# Patient Record
Sex: Female | Born: 1935 | Race: White | Hispanic: No | Smoking: Never smoker
Health system: Southern US, Community
[De-identification: ages and names within clinical notes are randomized; demographics above are authoritative.]

## PROBLEM LIST (undated history)

## (undated) DIAGNOSIS — C801 Malignant (primary) neoplasm, unspecified: Secondary | ICD-10-CM

## (undated) DIAGNOSIS — M519 Unspecified thoracic, thoracolumbar and lumbosacral intervertebral disc disorder: Secondary | ICD-10-CM

## (undated) DIAGNOSIS — D649 Anemia, unspecified: Secondary | ICD-10-CM

## (undated) DIAGNOSIS — S72009A Fracture of unspecified part of neck of unspecified femur, initial encounter for closed fracture: Secondary | ICD-10-CM

## (undated) DIAGNOSIS — I10 Essential (primary) hypertension: Secondary | ICD-10-CM

## (undated) DIAGNOSIS — I779 Disorder of arteries and arterioles, unspecified: Secondary | ICD-10-CM

## (undated) DIAGNOSIS — J189 Pneumonia, unspecified organism: Secondary | ICD-10-CM

## (undated) DIAGNOSIS — I119 Hypertensive heart disease without heart failure: Secondary | ICD-10-CM

## (undated) DIAGNOSIS — I509 Heart failure, unspecified: Secondary | ICD-10-CM

## (undated) DIAGNOSIS — N390 Urinary tract infection, site not specified: Secondary | ICD-10-CM

## (undated) DIAGNOSIS — I739 Peripheral vascular disease, unspecified: Secondary | ICD-10-CM

## (undated) DIAGNOSIS — E039 Hypothyroidism, unspecified: Secondary | ICD-10-CM

## (undated) DIAGNOSIS — E7800 Pure hypercholesterolemia, unspecified: Secondary | ICD-10-CM

## (undated) DIAGNOSIS — I480 Paroxysmal atrial fibrillation: Secondary | ICD-10-CM

## (undated) DIAGNOSIS — M1990 Unspecified osteoarthritis, unspecified site: Secondary | ICD-10-CM

## (undated) DIAGNOSIS — K5790 Diverticulosis of intestine, part unspecified, without perforation or abscess without bleeding: Secondary | ICD-10-CM

## (undated) DIAGNOSIS — I499 Cardiac arrhythmia, unspecified: Secondary | ICD-10-CM

## (undated) DIAGNOSIS — I340 Nonrheumatic mitral (valve) insufficiency: Secondary | ICD-10-CM

## (undated) DIAGNOSIS — R011 Cardiac murmur, unspecified: Secondary | ICD-10-CM

## (undated) HISTORY — DX: Cardiac arrhythmia, unspecified: I49.9

## (undated) HISTORY — PX: ABDOMINAL HYSTERECTOMY: SHX81

## (undated) HISTORY — PX: KNEE SURGERY: SHX244

## (undated) HISTORY — DX: Fracture of unspecified part of neck of unspecified femur, initial encounter for closed fracture: S72.009A

## (undated) HISTORY — PX: CATARACT EXTRACTION: SUR2

## (undated) HISTORY — PX: CARPAL TUNNEL RELEASE: SHX101

## (undated) HISTORY — DX: Nonrheumatic mitral (valve) insufficiency: I34.0

## (undated) HISTORY — DX: Urinary tract infection, site not specified: N39.0

## (undated) HISTORY — PX: EYE SURGERY: SHX253

## (undated) HISTORY — PX: ROTATOR CUFF REPAIR: SHX139

---

## 1998-09-10 ENCOUNTER — Other Ambulatory Visit: Admission: RE | Admit: 1998-09-10 | Discharge: 1998-09-10 | Payer: Self-pay | Admitting: Obstetrics and Gynecology

## 1999-02-14 ENCOUNTER — Emergency Department (HOSPITAL_COMMUNITY): Admission: EM | Admit: 1999-02-14 | Discharge: 1999-02-14 | Payer: Self-pay | Admitting: *Deleted

## 1999-03-30 ENCOUNTER — Encounter: Payer: Self-pay | Admitting: Emergency Medicine

## 1999-03-31 ENCOUNTER — Inpatient Hospital Stay (HOSPITAL_COMMUNITY): Admission: EM | Admit: 1999-03-31 | Discharge: 1999-04-01 | Payer: Self-pay | Admitting: Emergency Medicine

## 2001-03-08 ENCOUNTER — Other Ambulatory Visit: Admission: RE | Admit: 2001-03-08 | Discharge: 2001-03-08 | Payer: Self-pay | Admitting: Obstetrics and Gynecology

## 2001-08-25 ENCOUNTER — Encounter: Admission: RE | Admit: 2001-08-25 | Discharge: 2001-11-23 | Payer: Self-pay | Admitting: Emergency Medicine

## 2002-03-02 ENCOUNTER — Ambulatory Visit (HOSPITAL_COMMUNITY): Admission: RE | Admit: 2002-03-02 | Discharge: 2002-03-02 | Payer: Self-pay | Admitting: Cardiology

## 2002-03-02 ENCOUNTER — Encounter: Payer: Self-pay | Admitting: Cardiology

## 2002-07-14 ENCOUNTER — Ambulatory Visit (HOSPITAL_COMMUNITY): Admission: RE | Admit: 2002-07-14 | Discharge: 2002-07-14 | Payer: Self-pay | Admitting: Gastroenterology

## 2002-07-14 ENCOUNTER — Encounter (INDEPENDENT_AMBULATORY_CARE_PROVIDER_SITE_OTHER): Payer: Self-pay | Admitting: Specialist

## 2003-03-02 ENCOUNTER — Other Ambulatory Visit: Admission: RE | Admit: 2003-03-02 | Discharge: 2003-03-02 | Payer: Self-pay | Admitting: Obstetrics and Gynecology

## 2003-10-09 ENCOUNTER — Inpatient Hospital Stay (HOSPITAL_COMMUNITY): Admission: RE | Admit: 2003-10-09 | Discharge: 2003-10-10 | Payer: Self-pay | Admitting: Vascular Surgery

## 2003-10-09 ENCOUNTER — Encounter (INDEPENDENT_AMBULATORY_CARE_PROVIDER_SITE_OTHER): Payer: Self-pay | Admitting: *Deleted

## 2003-10-09 HISTORY — PX: CAROTID ENDARTERECTOMY: SUR193

## 2003-11-30 ENCOUNTER — Emergency Department (HOSPITAL_COMMUNITY): Admission: EM | Admit: 2003-11-30 | Discharge: 2003-11-30 | Payer: Self-pay | Admitting: Emergency Medicine

## 2003-12-01 ENCOUNTER — Emergency Department (HOSPITAL_COMMUNITY): Admission: EM | Admit: 2003-12-01 | Discharge: 2003-12-01 | Payer: Self-pay | Admitting: Emergency Medicine

## 2003-12-03 ENCOUNTER — Other Ambulatory Visit: Admission: RE | Admit: 2003-12-03 | Discharge: 2003-12-03 | Payer: Self-pay | Admitting: Oncology

## 2003-12-03 ENCOUNTER — Encounter (INDEPENDENT_AMBULATORY_CARE_PROVIDER_SITE_OTHER): Payer: Self-pay | Admitting: Specialist

## 2003-12-31 ENCOUNTER — Ambulatory Visit: Payer: Self-pay | Admitting: Oncology

## 2004-02-26 ENCOUNTER — Ambulatory Visit: Payer: Self-pay | Admitting: Oncology

## 2004-06-02 ENCOUNTER — Ambulatory Visit: Payer: Self-pay | Admitting: Oncology

## 2004-08-04 ENCOUNTER — Ambulatory Visit: Payer: Self-pay | Admitting: Internal Medicine

## 2004-10-24 ENCOUNTER — Ambulatory Visit: Payer: Self-pay | Admitting: Internal Medicine

## 2004-12-15 ENCOUNTER — Ambulatory Visit: Payer: Self-pay | Admitting: Internal Medicine

## 2005-02-02 ENCOUNTER — Ambulatory Visit: Payer: Self-pay | Admitting: Internal Medicine

## 2005-05-11 ENCOUNTER — Ambulatory Visit: Payer: Self-pay | Admitting: Internal Medicine

## 2005-08-13 ENCOUNTER — Ambulatory Visit: Payer: Self-pay | Admitting: Internal Medicine

## 2005-08-17 LAB — CBC WITH DIFFERENTIAL/PLATELET
BASO%: 0.6 % (ref 0.0–2.0)
Basophils Absolute: 0 10*3/uL (ref 0.0–0.1)
EOS%: 1.7 % (ref 0.0–7.0)
Eosinophils Absolute: 0.1 10*3/uL (ref 0.0–0.5)
HCT: 35.6 % (ref 34.8–46.6)
HGB: 12.2 g/dL (ref 11.6–15.9)
LYMPH%: 29.1 % (ref 14.0–48.0)
MCH: 31.7 pg (ref 26.0–34.0)
MCHC: 34.4 g/dL (ref 32.0–36.0)
MCV: 92.1 fL (ref 81.0–101.0)
MONO#: 0.3 10*3/uL (ref 0.1–0.9)
MONO%: 7.4 % (ref 0.0–13.0)
NEUT#: 2.4 10*3/uL (ref 1.5–6.5)
NEUT%: 61.2 % (ref 39.6–76.8)
Platelets: 183 10*3/uL (ref 145–400)
RBC: 3.86 10*6/uL (ref 3.70–5.32)
RDW: 12.7 % (ref 11.3–14.5)
WBC: 3.9 10*3/uL (ref 3.9–10.0)
lymph#: 1.1 10*3/uL (ref 0.9–3.3)

## 2005-08-17 LAB — LACTATE DEHYDROGENASE: LDH: 184 U/L (ref 94–250)

## 2005-11-02 ENCOUNTER — Encounter: Admission: RE | Admit: 2005-11-02 | Discharge: 2005-11-02 | Payer: Self-pay | Admitting: Emergency Medicine

## 2005-12-07 ENCOUNTER — Ambulatory Visit (HOSPITAL_COMMUNITY): Admission: RE | Admit: 2005-12-07 | Discharge: 2005-12-07 | Payer: Self-pay | Admitting: General Surgery

## 2005-12-07 ENCOUNTER — Encounter (INDEPENDENT_AMBULATORY_CARE_PROVIDER_SITE_OTHER): Payer: Self-pay | Admitting: Specialist

## 2005-12-10 ENCOUNTER — Ambulatory Visit: Payer: Self-pay | Admitting: Internal Medicine

## 2006-11-16 ENCOUNTER — Other Ambulatory Visit: Admission: RE | Admit: 2006-11-16 | Discharge: 2006-11-16 | Payer: Self-pay | Admitting: Obstetrics and Gynecology

## 2007-03-04 ENCOUNTER — Ambulatory Visit: Payer: Self-pay | Admitting: Vascular Surgery

## 2007-09-30 ENCOUNTER — Ambulatory Visit (HOSPITAL_COMMUNITY): Admission: RE | Admit: 2007-09-30 | Discharge: 2007-10-01 | Payer: Self-pay | Admitting: Orthopedic Surgery

## 2008-04-05 ENCOUNTER — Ambulatory Visit (HOSPITAL_BASED_OUTPATIENT_CLINIC_OR_DEPARTMENT_OTHER): Admission: RE | Admit: 2008-04-05 | Discharge: 2008-04-05 | Payer: Self-pay | Admitting: Orthopedic Surgery

## 2008-04-26 ENCOUNTER — Encounter: Admission: RE | Admit: 2008-04-26 | Discharge: 2008-04-26 | Payer: Self-pay | Admitting: Orthopedic Surgery

## 2008-09-26 ENCOUNTER — Ambulatory Visit: Payer: Self-pay | Admitting: Vascular Surgery

## 2009-10-15 ENCOUNTER — Ambulatory Visit: Payer: Self-pay | Admitting: Obstetrics and Gynecology

## 2009-10-15 ENCOUNTER — Other Ambulatory Visit: Admission: RE | Admit: 2009-10-15 | Discharge: 2009-10-15 | Payer: Self-pay | Admitting: Obstetrics and Gynecology

## 2009-12-12 ENCOUNTER — Ambulatory Visit: Payer: Self-pay | Admitting: Vascular Surgery

## 2009-12-18 ENCOUNTER — Encounter: Admission: RE | Admit: 2009-12-18 | Discharge: 2009-12-18 | Payer: Self-pay | Admitting: Neurosurgery

## 2010-03-16 ENCOUNTER — Encounter: Payer: Self-pay | Admitting: General Surgery

## 2010-06-10 LAB — POCT I-STAT 4, (NA,K, GLUC, HGB,HCT)
Glucose, Bld: 94 mg/dL (ref 70–99)
HCT: 38 % (ref 36.0–46.0)
Hemoglobin: 12.9 g/dL (ref 12.0–15.0)
Potassium: 3.5 mEq/L (ref 3.5–5.1)
Sodium: 141 mEq/L (ref 135–145)

## 2010-07-08 NOTE — Op Note (Signed)
NAMECAMIRA, GEIDEL                  ACCOUNT NO.:  [id number]   MEDICAL RECORD NO.:  [id number]          PATIENT TYPE:  AMB   LOCATION:  [facility]                         FACILITY:  [facility]   PHYSICIAN:  Marlowe Kays, M.D.  DATE OF BIRTH:  08/13/1924   DATE OF PROCEDURE:  04/05/2008  DATE OF DISCHARGE:                               OPERATIVE REPORT   PREOPERATIVE DIAGNOSIS:  Carpal tunnel syndrome, right.   POSTOPERATIVE DIAGNOSIS:  Carpal tunnel syndrome, right.   OPERATION:  Decompression median nerve right wrist and hand.   SURGEON:  Marlowe Kays, M.D.   ASSISTANT:  Nurse.   ANESTHESIA:  IV regional.   PATHOLOGY AND JUSTIFICATION OF PROCEDURE:  Signs and symptoms of carpal  tunnel syndrome with markedly delayed median nerve conduction study on  testing.  Ulnar nerve had minimal delay at the elbow, was basically  normal.   PROCEDURE:  Satisfactory IV regional anesthesia, DuraPrep from  midforearm to fingertips, draped in sterile field, I marked out surgical  incision along the base of the thenar eminence crossing obliquely over  the flexor crease of the wrist and the distal forearm.  The palmaris  longus tendon was identified and retracted to the radial side.  Beneath  it, the median nerve was exposed and I immediately noted that it was  quite swollen and discolored with a bulbous appearance proximal to the  flexor wrist.  I progressively released skin, subcutaneous tissue and  fascia in the distal palm decompressing the nerve.  It was badly  contused and thinned out in the mid palm.  Bleeders were coagulated with  bipolar cautery.  At the conclusion of the decompression.  I then  irrigated the wound well with sterile saline, closed the skin and  subcutaneous tissue only with interrupted 4-0 __________ sutures.  Betadine, Adaptic, dry sterile dressing and volar plaster splint were  applied.  Tourniquet was released.  She tolerated the procedure well and  was taken to the  recovery room in satisfactory condition with no known  complications.           ______________________________  Marlowe Kays, M.D.     JA/MEDQ  D:  04/05/2008  T:  04/05/2008  Job:  [id number]

## 2010-07-08 NOTE — Procedures (Signed)
CAROTID DUPLEX EXAM   INDICATION:  Followup carotid artery disease, syncope.   HISTORY:  Diabetes:  No.  Cardiac:  No.  Hypertension:  Yes.  Smoking:  No.  Previous Surgery:  Right CEA with DPA and resection of redundant CCA on  10/09/2003 by Dr. Arbie Cookey.  CV History:  Syncope infrequently.  Amaurosis Fugax No, Paresthesias No, Hemiparesis No                                       RIGHT             LEFT  Brachial systolic pressure:         140               136  Brachial Doppler waveforms:         WNL               WNL  Vertebral direction of flow:        Antegrade         Antegrade  DUPLEX VELOCITIES (cm/sec)  CCA peak systolic                   97                81  ECA peak systolic                   202               323  ICA peak systolic                   131               126  ICA end diastolic                   26                28  PLAQUE MORPHOLOGY:                  Homogeneous       Calcific  PLAQUE AMOUNT:                      Mild              Mild/moderate  PLAQUE LOCATION:                    ICA/ECA/CCA       ICA/ECA/CCA   IMPRESSION:  1. Right ICA velocities are suggestive of 40-59% stenosis (low end of      range), showing no significant change in velocities from previous      study.  2. Left ICA shows evidence of 40-59% stenosis, an increase from      previous study.  3. Bilateral ECA stenosis.   ___________________________________________  Larina Earthly, M.D.   AS/MEDQ  D:  09/26/2008  T:  09/26/2008  Job:  [id number]

## 2010-07-08 NOTE — Procedures (Signed)
CAROTID DUPLEX EXAM   INDICATION:  Syncope.   HISTORY:  Diabetes:  No.  Cardiac:  No.  Hypertension:  Yes, on medication.  Smoking:  No.  Previous Surgery:  Right carotid endarterectomy with DPA and resection  of redundant common carotid artery on 10/09/2003 by Dr. Arbie Cookey.  CV History:  Amaurosis Fugax No, Paresthesias No, Hemiparesis No                                       RIGHT             LEFT  Brachial systolic pressure:         150               150  Brachial Doppler waveforms:         Biphasic          Biphasic  Vertebral direction of flow:        Antegrade         Antegrade  DUPLEX VELOCITIES (cm/sec)  CCA peak systolic                   74                80  ECA peak systolic                   173               318  ICA peak systolic                   115               84  ICA end diastolic                   24                22  PLAQUE MORPHOLOGY:                  Soft              Calcified  PLAQUE AMOUNT:                      Moderate          Large  PLAQUE LOCATION:                    Distal CCA, ECA, ICA                ICA, ECA    Velocities in the distal right common carotid artery are 124 cm/s  IMPRESSION:  1. Bilateral external carotid artery stenoses.  2. 1-39% bilateral internal carotid artery stenoses.  3. Mildly elevated velocities in the distal right common carotid      artery are stable from previous examination.  4. Study essentially unchanged from 12/11/2004.   ___________________________________________  Larina Earthly, M.D.   DP/MEDQ  D:  03/04/2007  T:  03/04/2007  Job:  [id number]

## 2010-07-08 NOTE — Procedures (Signed)
CAROTID DUPLEX EXAM   INDICATION:  Follow up carotid artery disease, syncope.   HISTORY:  Diabetes:  No.  Cardiac:  No.  Hypertension:  Yes.  Smoking:  No.  Previous Surgery:  Right CEA with a DPA and resection of redundant CCA  on 10/09/2003 by Dr. Arbie Cookey.  CV History:  Syncope infrequently.  Amaurosis Fugax , Paresthesias , Hemiparesis                                       RIGHT             LEFT  Brachial systolic pressure:         140               136  Brachial Doppler waveforms:         WNL               WNL  Vertebral direction of flow:        Antegrade         Antegrade  DUPLEX VELOCITIES (cm/sec)  CCA peak systolic                   65                68  ECA peak systolic                   129               292  ICA peak systolic                   114               85  ICA end diastolic                   25                19  PLAQUE MORPHOLOGY:                  Heterogenous      Heterogenous  PLAQUE AMOUNT:                      Mild              Mild-to-moderate  PLAQUE LOCATION:                    ICA, CCA, ECA     ICA, CCA, ECA   IMPRESSION:  1. Right internal carotid artery velocities are suggestive of 40% to      59% stenosis, the low end range.  2. Left internal carotid artery is 1% to 39% stenosis.  3. Stable study compared to previous study of 10/06/2008.   ___________________________________________  Larina Earthly, M.D.   OD/MEDQ  D:  12/12/2009  T:  12/12/2009  Job:  [id number]

## 2010-07-08 NOTE — Op Note (Signed)
Desiree [name], Desiree [name]                  ACCOUNT NO.:  [id number]   MEDICAL RECORD NO.:  [id number]          PATIENT TYPE:  AMB   LOCATION:  DAY                          FACILITY:  [facility]   PHYSICIAN:  Marlowe Kays, M.D.  DATE OF BIRTH:  1924-08-26   DATE OF PROCEDURE:  09/30/2007  DATE OF DISCHARGE:                               OPERATIVE REPORT   PREOPERATIVE DIAGNOSIS:  Recurrent rotator cuff tear, left shoulder.   POSTOPERATIVE DIAGNOSIS:  Recurrent rotator cuff tear, left shoulder.   OPERATION:  Anterior acromionectomy, repair of recurrent rotator cuff  tear, left shoulder.   SURGEON:  Marlowe Kays, M.D.   ASSISTANTDruscilla Brownie. Idolina Primer, P.A.-C.   ANESTHESIA:  General.   PATHOLOGY/JUSTIFICATION FOR PROCEDURE:  She had had a rotator cuff tear  as well as distal clavicle excision by one of my now retired partners,  Dr. Ronnell Guadalajara, a number of years ago.  Because of recurrent pain in  the shoulder,  I obtained an MRI demonstrating articular surface tear of  the distal rotator cuff with about 3-5 cm of retraction and medial  subluxation of the biceps tendon.  Preoperative plan was to detach the  thin remaining bursal surface fibers of the rotator cuff and then  reattach the entire rotator cuff.   PROCEDURE:  Prophylactic antibiotics, satisfactory anesthesia, beach-  chair position on the [place] frame.  Left shoulder girdle was prepped  with DuraPrep, draped in sterile field.  Old surgical incision was  marked out, [place] employed.  Time out performed.  I made incision  through the old scar down to the acromion and with cutting cautery split  the fascia over the anterior acromion, and the deltoid 4 cm lateral ward  to expose the anterior acromion which had reformed with some bone in BAK  fashion.  I was able to undermine this area with small Cobb elevator  followed by a larger Cobb elevator and then performed a generous  anterior acromionectomy and decompression on the  underneath surface  until there was wide clearance for the rotator cuff.  There was bursal  tissue adherent to the rotator cuff which I freed up with sharp  dissection to identify the rotator cuff pathology.  The obvious external  pathology was about a centimeter tear just posterior to the greater  tuberosity.  I opened this up posteriorly and anteriorly, and this  allowed me to look way up underneath the articular surface of the  rotator cuff.  Biceps tendon was identified, and the rotator cuff was  actually reasonably healthy except for this lateral portion with partial  detachment.  After freeing up the rotator cuff, I roughened up the  greater tuberosity area and used a single Stryker 4 stranded rotator  cuff anchor in the lateral humerus, checking to be sure that it was well  stabilized in the bone.  I then interspersed the 4 strands through the  length of the rotator cuff anteriorly and posteriorly and, with the arm  slightly abducted, was able to bring the rotator cuff over laterally.  After tying these sutures, I  then supplemented the end of the rotator  cuff with multiple additional sutures from these 4 strands, giving a  nice supplemented smooth terminal portion to the rotator cuff.  I then  checked, and there did not appear to be any other tears anteriorly or  posteriorly, and there was no residual impingement.  The wound was  irrigated well with sterile saline.  She had a preoperative interscalene  block but I supplemented her anyway with some half percent Marcaine with  adrenaline.  I then closed  the wound with interrupted #1 Vicryl in the deltoid muscle and fascia  over the residual anterior acromion, 2-0 Vicryl subcutaneous tissue,  Steri-Strips on the skin.  Dry sterile dressing and shoulder immobilizer  applied.  She tolerated the procedure well and was taken to recovery in  satisfactory condition with no known complications.           ______________________________   Marlowe Kays, M.D.     JA/MEDQ  D:  09/30/2007  T:  09/30/2007  Job:  [id number]

## 2010-07-11 NOTE — Op Note (Signed)
NAMECHARL, Desiree [name]                  ACCOUNT NO.:  [id number]   MEDICAL RECORD NO.:  [id number]          PATIENT TYPE:  AMB   LOCATION:  DAY                          FACILITY:  [facility]   PHYSICIAN:  Timothy E. Earlene Plater, M.D. DATE OF BIRTH:  September 04, 1924   DATE OF PROCEDURE:  12/07/2005  DATE OF DISCHARGE:                                 OPERATIVE REPORT   PREOPERATIVE DIAGNOSIS:  Prolapsing hemorrhoids.   POSTOPERATIVE DIAGNOSIS:  Prolapsing hemorrhoids.   PROCEDURE:  PPH.   SURGEON:  Timothy E. Earlene Plater, M.D.   ANESTHESIA:  General.   Desiree [name] is 78, active, full time employed, and has prolapsing hemorrhoids  with mucous soilage, inability to reduce.  She desires surgery.  Unfortunately she has a considerable irritable bowel syndrome with mucous  and alternating constipation and diarrhea.  She has been carefully explained  the procedure, the expectations, potential complications, and the interplay  of the irritable bowel syndrome.   She is seen, identified, and the permit signed today.   DESCRIPTION OF PROCEDURE:  She is taken to the operating room, placed  supine.  LMA anesthesia provided.  She was placed in lithotomy position.  The perianal area was inspected, prepped and draped in the usual fashion.  There was a small external tag right anterior position.  The sphincter was  present reactive but relaxed.  And there was easy prolapse of the distal  mucosa.   The operating anoscope placed, redundant mucosa and easy prolapse noted.  The anus was injected round and about with Marcaine, epinephrine, [place] for  prolonged anesthesia.  With the operating scope in place, a circumferential  pursestring suture of 2-0 Prolene was placed beginning anteriorly,  encircling the rectal mucosa only.  This was done at 5 cm proximal to the  dentate line.  The operating scope removed. The plastic Ethicon operating  anoscope inserted.  The sutures brought through the center of the scope.  The suture  line in its circumferential nature was checked with the examining  finger and then the opened distal end of the PPH stapling device was placed  gently through the circumferential suture.  The handle down position.  The  suture was then tied about the shaft of the stapling device and then with it  properly positioned, it was gently advanced and closed.  It was held there  for 40 seconds and then fired and held for 45 seconds and then removed.  The  specimen was good, intact about the shaft of the stapling device with suture  intact.  It was cut and sent to pathology.  Meanwhile, the attention to the  suture line showed one area of bleeding in the left anterior position.  A  suture ligature of 4-0 suture was placed and that was controlled.  Careful  observation and gentle exam over the next 5 minutes revealed no other areas  of bleeding.  This procedure was complete. Gelfoam wrapped in Surgicel was  placed in rectal canal and dry sterile external dressing.  She tolerated it  well and was removed recovery room in good condition.  Written and verbal instructions were given to her and her daughter with the  warning signs.  She has Vicodin at home that she uses on regular basis that  will be supplemented and she will be followed as an outpatient.      Timothy E. Earlene Plater, M.D.  Electronically Signed     TED/MEDQ  D:  12/07/2005  T:  12/07/2005  Job:  [id number]   cc:   Georga Hacking, M.D.  Fax: [phone]  Email: [email or web address]   Bernette Redbird, M.D.  Fax: [phone]

## 2010-07-11 NOTE — H&P (Signed)
[place]  Patient:    Desiree [name], Desiree [name]                         MRN: [id number] Adm. Date:  03/31/99 Dictator:   Doreatha Lew, M.D.                         History and Physical  HISTORY OF PRESENT ILLNESS:  The patient is a 77 year old active female, who acutely developed diarrhea today around noon while eating lunch.  Two to three hours later she began to get nauseated with persistent vomiting that lasted three to four hours.  She became weak and a friend of hers called EMS and brought her to [facility] for evaluation.  Once in [place], she has received over a liter of fluid with little relief.  She currently is complaining of some diffuse abdominal pain and nausea.  She has not had any vomiting or diarrhea since she as been in the emergency room.  She has no history of fever.  She denies any hematochezia, melena, or hematemesis.  REVIEW OF SYSTEMS:  Negative for chest pain, shortness of breath, cough, lower extremity edema, or urinary changes.  PAST MEDICAL HISTORY:  Significant for hypertension, hypothyroidism, syncope. Recent work-up per Dr. Grace Isaac, a neurologist in [facility].  The patient had an EMG last week and a carotid Doppler scheduled for next week.  Menopausal, on  hormone replacement therapy.  PAST SURGICAL HISTORY:  Rotator cuff repair x 2.  Foot and toe surgery in the past. Tonsillectomy and a partial hysterectomy.  MEDICATIONS: 1. Altace 10 mg q.d. 2. Aspirin. 3. Synthroid 0.075 mg p.o. q.d. 4. Estropipate 0.625 p.o. q.d. 5. Prednisone taper 4 mg intending to have 8 mg tomorrow, 4 mg the next    day and then off.  She was started on this by the neurologist.  She is unable    to tell me why.  ALLERGIES:  NKDA.  SOCIAL HISTORY:  She denies any alcohol or tobacco use.  She lives alone and she works daily as a Marine scientist.  FAMILY HISTORY:  Noncontributory.  PHYSICAL  EXAMINATION:  VITAL SIGNS:  Temperature is 97, heart rate initially 101, now 86, respiratory ate 20.  Blood pressure 172/77 initially, now 141/67.  GENERAL:  She is sleepy, oriented in minimal discomfort but no acute distress.  HEENT:  She has no scleral icterus.  OP shows dry mucous membranes.  No other abnormality.  NECK:  Supple.  No masses.  No LAD.  No thyromegaly.  CHEST:  CTA bilaterally.  CARDIAC:  Regular rate and rhythm.  No murmur, gallop, or rub.  ABDOMEN:  Soft.  She is mildly diffusely tender to palpation but no rebound or guarding.  She has hypoactive bowel sounds in all quadrants.  She has no masses. No HSM.  RECTAL:  Guaiac-negative with normal tone.  EXTREMITIES:  No clubbing, cyanosis, or edema.  LABORATORY DATA:  Reveals a CMET which is completely normal.  A CBC with a white count of 9.9, hemoglobin and hematocrit normal, 86% neutrophils, 6% lymphocytes.  Acute abdomen series is within normal limits with no obstruction, perforation.  Chest x-ray shows no acute disease.  Mild cardiac enlargement.  They do not mild degenerative disk disease at L4, L5 and L5-S1.  IMPRESSION:  The patient is a 77 year old female, otherwise active, who acutely  became ill today with probable gastroenteritis,  viral.  No acute abdomen currently.  PLAN:  We will watch closely.  Continue hydration overnight.  Continue with antiemetics.  Keep her n.p.o. and advance to clears as tolerated.  We will check lipase and amylase to ensure no pancreatic involvement.  Continue on some Pepcid as the recent steroids might have irritated and caused the gastritis, and resume normal meds when taking p.o.s, probably in the morning.  We will watch her blood pressure and start sooner for any hypertension overnight. DD:  03/31/99 TD:  03/31/99 Job: [id number]

## 2010-07-11 NOTE — Op Note (Signed)
   Desiree [name], Desiree [name]                              ACCOUNT NO.:  [id number]   MEDICAL RECORD NO.:  [id number]                   PATIENT TYPE:  AMB   LOCATION:  ENDO                                 FACILITY:  [facility]   PHYSICIAN:  Bernette Redbird, M.D.                DATE OF BIRTH:  1924-04-01   DATE OF PROCEDURE:  DATE OF DISCHARGE:                                 OPERATIVE REPORT   PROCEDURE:  Colonoscopy with biopsy.   ENDOSCOPIST:  Bernette Redbird, M.D.   INDICATIONS FOR PROCEDURE:  This is a 77 year old female for colon cancer  screening.   FINDINGS:  Two diminutive polyps found and removed.   DESCRIPTION OF PROCEDURE:  The nature, purpose and risks of the procedure  were familiar to the patient from prior examination and she provided  consent.  Sedation:  Fentanyl 30 mcg and Versed 3 mg IV without arrhythmias  or desaturation.   The Olympus adjustable tension pediatric videocolonoscope was readily  advanced to the cecum.  Using some external abdominal compression to reach  the base of the cecum.  The cecum was identified by clear visualization of  the appendiceal orifice and pull back was then performed.  The quality of  prep was excellent and it was felt that all areas were well seen.   There were 2 diminutive sessile polyps removed during this exam.  One was at  14 cm in the rectum, hyperplastic in appearance, removed by a single cold  biopsy; and the other was in the more proximal section of the colon,  although an exact localization was not noted.  It was similarly a small  sessile polyp removed by 1 or 2 cold biopsies.  No larger polyps were seen  nor was there any evidence of cancer, colitis, vascular malformations, or  diverticulosis.   Retroflexion of the rectum and reinspection of the rectosigmoid was  otherwise unremarkable.  The patient tolerated the procedure well and there  were no apparent complications.   IMPRESSION:  Diminutive polyps, otherwise normal  exam.   PLAN:  Await pathology.                                               Bernette Redbird, M.D.   RB/MEDQ  D:  07/14/2002  T:  07/15/2002  Job:  [id number]   cc:   Oley Balm. Georgina Pillion, M.D.  [address]  [place]  [place] [address]  Fax: [phone]

## 2010-07-11 NOTE — Discharge Summary (Signed)
NAMEAVIELLE, IMBERT                            ACCOUNT NO.:  [id number]   MEDICAL RECORD NO.:  [id number]                   PATIENT TYPE:  INP   LOCATION:  3302                                 FACILITY:  [facility]   PHYSICIAN:  Larina Earthly, M.D.                 DATE OF BIRTH:  September 02, 1924   DATE OF ADMISSION:  10/09/2003  DATE OF DISCHARGE:  10/10/2003                                 DISCHARGE SUMMARY   ADMISSION DIAGNOSIS:  Asymptomatic, severe right carotid artery stenosis.   DISCHARGE/SECONDARY DIAGNOSES:  1. Asymptomatic, severe right carotid artery stenosis, status post carotid     endarterectomy.  2. Anemia; history of iron deficiency anemia.  3. Hypertension.  4. Hypercholesterolemia.  5. Remote history of paroxysmal atrial fibrillation.  6. Severe arthritis in her shoulders, knees and back.  7. Status post hysterectomy.  8. Status post bilateral rotator cuff repairs.  9. Bilateral knee arthroscopies.  10.      Bilateral cataract removal.   PROCEDURES:  On October 09, 2003, Ms. Peckenpaugh underwent right carotid  endarterectomy, resection of redundant common carotid artery, and Dacron  patch angioplasty by Dr. Kristen Loader. Early.   BRIEF HISTORY:  Ms. Wanless is a 77 year old Caucasian female who was referred  by Dr. Lacretia Nicks. Viann Fish for evaluation of carotid artery disease.  The  patient has been followed by Dr. Arbie Cookey for asymptomatic bilateral coronary  artery disease for 3 years now.  Recent carotid Duplex exam showed an  increase in the right carotid stenosis to 80% to 99%.  The patient continues  to be asymptomatic.  Due to the increase in stenosis on the right, he felt  she should undergo a right carotid endarterectomy to reduce her risk of  stroke.   HOSPITAL COURSE:  On October 09, 2003, Ms. Bazin was electively admitted to  the [facility] to undergo a right carotid endarterectomy as  described above.  She tolerated the procedure well and was extubated shortly  afterward, neurologically intact.  After a short stay in the recovery unit,  she was transferred to unit 3300, a stepdown unit.   On postoperative day 1, Ms. Sivils was hemodynamically stable with blood  pressure of 110/35, heart rate in the 70s.  She was afebrile.  At that time,  she was saturating 98% on 2 L per nasal cannula.  Her Foley catheter had  just been removed.  She had already been out in the hallways ambulating.  She was without nausea and vomiting.  She reported no difficulty breathing  or swallowing.  On exam, her heart noted a regular rate and rhythm, her  lungs were clear, abdomen was soft and nontender with good bowel sounds, her  extremities showed no edema.  Her neurological exam was grossly intact.  Her  extremities were strong and symmetrical.  There was facial symmetry present.  Her tongue did have  a slight deviation to the left.  Her right neck incision  was clean and dry with some ecchymosis and mild swelling, but no hematoma.  It was felt that if she were able to void after the removal of her Foley  catheter, she was weaned from supplemental oxygen, continued to tolerate an  oral diet, that she would be discharged later that day.  By late morning,  she had met those criteria and was felt stable for discharge home.  Of note,  her morning labs did show a white blood count of 5.7, a platelet count of  137,000, hemoglobin and hematocrit decreased at 8.9 and 26.0.  Her sodium  was 134, potassium 4.6, BUN 23, creatinine stable at 1.2 and blood glucose  was 136.  Although her blood counts were low, she did have mild anemia on  her preop labs with a hemoglobin of 9.6 and hematocrit of 27.9.  At the time  of discharge, it was felt that she was asymptomatic of her anemia.  She did  report a previous history of iron deficiency anemia which has been followed  by her primary physician, Dr. Onalee Hua B. Georgina Pillion, in the past.  It was felt  that she would be stable for discharge, despite  her anemia.  She was  instructed to schedule followup with Dr. Georgina Pillion to recheck her blood counts.  The decease from her baseline was felt secondary to surgical blood loss.  In  the meantime, she is to follow a diet rich in iron, which she has done in  the past.   Ms. Viglione was discharged home in stable condition on October 10, 2003.   DISCHARGE MEDICATIONS:  1. Aspirin 81 mg 1 p.o. daily.  2. Celebrex 200 mg p.o. q.12 h.  3. Diovan HCT 160/25 mg daily.  4. Toprol-XL 100 mg daily.  5. Synthroid 0.075 mg daily.  6. Detrol LA 4 mg daily.  7. Nexium 40 mg p.o. daily.  8. Lipitor 20 mg p.o. daily.  9. Hydrocodone/APAP 5/500 mg 1-2 tablets every 6 hours as needed.   PAIN MANAGEMENT:  She was given a prescription for Tylox 1-2 tablets p.o.  q.4-6 h. p.r.n. pain.  She may substitute this with her home prescription of  her Vicodin if she desires.   DISCHARGE INSTRUCTIONS:  She is to avoid driving or strenuous activity.  She  is to follow a low-fat, low-salt diet.  She may shower starting October 11, 2003.  She is to clean her incisions daily with a mild soap and water.  She  is to notify the [facility] office if she develops fever greater than 101 or if  her incisions develop redness, swelling or drainage.  She should also notify  the [facility] office if she develops any neurological changes.   FOLLOWUP:  1. She is to follow up with Dr. Arbie Cookey in the [facility] office on November 01, 2003 at 10:40 a.m.  2. She is to call and schedule a 2- to 4-week followup with Dr. Lajean Manes     to recheck her blood counts.      Jerold Coombe, P.A.                  Larina Earthly, M.D.    AWZ/MEDQ  D:  10/10/2003  T:  10/11/2003  Job:  [id number]   cc:   Larina Earthly, M.D.  [address]  [place]  [place] [address]   W. Karleen Hampshire  Lynnell Grain., M.D.  [address], [address]  [place]  [place] [address]  Fax: [phone]   Oley Balm. Georgina Pillion, M.D.  [address] [place]  [place] [address]  Fax:  [phone]

## 2010-07-11 NOTE — H&P (Signed)
NAME:  Desiree [name], Desiree [name]                            ACCOUNT NO.:  [id number]   MEDICAL RECORD NO.:  [id number]                   PATIENT TYPE:  INP   LOCATION:  NA                                   FACILITY:  [facility]   PHYSICIAN:  Larina Earthly, M.D.                 DATE OF BIRTH:  [birth year]   DATE OF ADMISSION:  10/09/2003  DATE OF DISCHARGE:                                HISTORY & PHYSICAL   CHIEF COMPLAINT:  Carotid artery disease.   HISTORY OF PRESENT ILLNESS:  This is a pleasant 77 year old female who was  referred by Dr. [name] for evaluation of carotid artery disease.  The  patient has been followed by Dr. Arbie Cookey for asymptomatic bilateral carotid  artery disease for three years now.  Recent duplex exam showed an increase  in the right carotid stenosis to 80-99%.  The patient continues to be  asymptomatic, specifically she denies any headache, nausea, vomiting,  vertigo, dizziness, dysphagia, vision changes, syncope, memory loss, or  confusion.  Dr. Arbie Cookey saw the patient in clinic, on September 27, 2003, at which  time she was found to have a severe internal carotid artery stenosis at 80-  99%.  This was an increase from a prior exam on October 05, 2002.  that  showed a 60-79% right internal carotid artery stenosis.  With these  findings, Dr. Arbie Cookey felt that the patient would benefit from a right carotid  endarterectomy to reduce her risk of stroke.  At that time, Dr. Arbie Cookey  explained the risks, benefits, and alternatives to the procedure, and the  patient was in agreement and wished to proceed.  Currently, the patient  presents for a routine preoperative history and physical examination.  Today, the patient admits to intermittent right arm numbness and muscle  weakness.  She states that she will wake up in the middle of the night and  not be able to move her right arm and it is numb to the touch.  She states  that she attributed this to her chronic back pain and back problems.  The  patient also admits to increasing hoarseness over the last year.  Otherwise,  the patient denies any change in vision, headache, nausea, vomiting,  vertigo, dizziness, syncope, memory loss, or confusion.  She denies any  recent fevers, chills, night sweats, or cough.   PAST MEDICAL HISTORY:  1. Carotid artery disease as stated above.  2. Hypertension.  3. Hypercholesterolemia.  4. Remote history of paroxysmal atrial fibrillation.  5. Severe arthritis in her shoulders, knees, and back.   PAST SURGICAL HISTORY:  1. Hysterectomy.  2. Bilateral shoulder rotator cuff repair.  3. Bilateral knee arthroscopy.  4. Bilateral cataract removal.   MEDICATIONS:  1. Aspirin 81 mg daily.  2. Hydrocodone/APAP 5/500 mg every six hours.  3. Celebrex 200 mg every 12 hours.  4. Diovan/HCT 160/25 mg daily.  5. Toprol XL 100 mg daily.  6. Synthroid 0.075 mg daily.  7. Detrol LA 4 mg daily.  8. Nexium 40 mg daily.  9. Lipitor 20 mg daily.   ALLERGIES:  No known drug allergies.   REVIEW OF SYSTEMS:  Please see HPI for pertinent positives and negatives,  otherwise as follows:  The patient denies any diabetes, kidney disease,  asthma, or coronary artery disease.  The patient does wear glasses.  She  admits to urinary frequency and denies any urgency, hesitancy, or dysuria.  The patient admits to chronic stomach cramps which she has had since she was  a child.  She has had no definitive diagnosis of gastritis, ulcer disease,  or any other GI problems.  The patient admits to intermittent diarrhea and  constipation.  The patient admits to severe arthritis of her shoulders,  knees, and back.  The patient is being evaluated for possible back surgery  in the future.  The patient, otherwise, denies any cardiac or pulmonary  problems including angina, palpitations, recent arrhythmias, bronchitis,  asthma, or pneumonia.   FAMILY HISTORY:  The patient's father passed away at age 62 from a brain  hemorrhage.   The patient's mother passed away at age 47 of a vitamin and  mineral deficiency.  The patient has two sisters who had a history of heart  problems and one died in her 67s, and the other is still alive with severe  Alzheimer's.  The patient also has a brother who had a history of cancer and  died at age 52 and another brother who died of a stroke at age 19.   SOCIAL HISTORY:  The patient is widowed and has two children.  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  She denies any tobacco use or alcohol use.   PHYSICAL EXAMINATION:  VITAL SIGNS:  Blood pressure is 139/60, pulse is 65  and regular, respirations are 20 and unlabored.  The patient is 5' tall and  weighs 143 pounds.  GENERAL:  This is a pleasant 77 year old white female who is in no acute  distress and is alert and oriented  x 3.  HEENT:  [place]/AT.  PERRLA.  EOMI.  The patient's oral mucosa is pink and moist  without exudate or lesions.  NECK:  Supple without JVD or lymphadenopathy.  The patient has bilateral  harsh carotid bruits on the right greater than the left.  She has 2+ carotid  pulses bilaterally.  LUNGS:  The patient's lungs are clear to auscultation without wheezes,  rhonchi, or rales.  CARDIAC:  Regular rate and rhythm without murmur, gallop or rub.  ABDOMEN:  Soft, nontender, nondistended with good bowel sounds in all four  quadrants.  No organomegaly or masses.  GU/RECTAL:  Deferred.  EXTREMITIES:  No clubbing, cyanosis, or edema.  SKIN:  Warm and dry.  PERIPHERAL PULSES:  As follows, 2+ carotid pulses bilaterally, 2+ femoral  pulses bilaterally, 2+ dorsalis pedis pulses bilaterally, 2+ posterior  tibial pulses bilaterally.  NEUROLOGIC:  No evidence of gross abnormalities.  The patient's gait is  steady.  Her muscle strength is equal and appropriate throughout.   ASSESSMENT:  This is a pleasant 77 year old female with severe right internal carotid artery stenosis.   PLAN:  We will continue as planned with a  right carotid endarterectomy, on  October 09, 2003, by Dr. Arbie Cookey.  As stated above, Dr. Arbie Cookey has seen and  evaluated this patient prior to this admission and has  explained the risks  and benefits of the procedure, and the patient has agreed to continue.      Carolyn A. Eustaquio Boyden.                  Larina Earthly, M.D.    CAF/MEDQ  D:  10/05/2003  T:  10/05/2003  Job:  [id number]   cc:   chart   Larina Earthly, M.D.  [address]  [place]  [place] [address]   Lacretia Nicks. Ashley Royalty., M.D.  [address], [address]  [place]  [place] [address]  Fax: [phone]

## 2010-07-11 NOTE — Op Note (Signed)
Desiree [name], SPADAFORA                            ACCOUNT NO.:  [id number]   MEDICAL RECORD NO.:  [id number]                   PATIENT TYPE:  INP   LOCATION:  2899                                 FACILITY:  [facility]   PHYSICIAN:  Larina Earthly, M.D.                 DATE OF BIRTH:  1924/10/04   DATE OF PROCEDURE:  10/09/2003  DATE OF DISCHARGE:                                 OPERATIVE REPORT   PREOPERATIVE DIAGNOSIS:  Severe asymptomatic right internal carotid artery  stenosis.   POSTOPERATIVE DIAGNOSIS:  Severe asymptomatic right internal carotid artery  stenosis.  Redundant carotid artery.   OPERATION PERFORMED:  Right carotid endarterectomy, resection of redundant  common carotid artery and Dacron patch angioplasty.   SURGEON:  Larina Earthly, M.D.   ASSISTANT:  Pecola Leisure, PA   ANESTHESIA:  General endotracheal.   COMPLICATIONS:  None.   DISPOSITION:  To recovery room neurologically intact.   DESCRIPTION OF PROCEDURE:  The patient was taken to the operating room and  placed in supine position where the area of the right neck was prepped and  draped in the usual sterile fashion.  Incision was made anterior in the  sternocleidomastoid muscle and carried down through the platysma with  electrocautery.  The sternocleidomastoid was reflected posteriorly and the  carotid sheath was opened.  The common carotid artery was encircled with  umbilical tape and Rumel tourniquet.  Vagus and hypoglossal nerves were  identified and preserved.  Dissection was extended onto the bifurcation.  The superior thyroid artery was encircled with a 2-0 silk and Potts tie.  The external carotid artery was encircled with a blue vessel loop.  The  internal carotid artery was encircled with umbilical tape and Rumel  tourniquet.  The patient had redundant internal and common carotid arteries.  After further mobilization, it was clear that resection of the artery was  going to be required.  For this  reason, the superior thyroid artery was  ligated for mobilization and the external and internal carotid arteries were  mobilized. The patient was given 7000 units of intravenous heparin.  After  adequate circulation time, the internal, external and common carotid  arteries were occluded.  The common carotid artery was opened with an 11  blade, extended longitudinally with Potts scissors through the plaque onto  the internal carotid with Potts scissors.  A 10 shunt was passed up the  internal carotid artery, allowed to back-bleed, then down the common carotid  artery where it was secured with Rumel tourniquets.  Endarterectomy was  begun on the common carotid artery and the plaque was divided proximally  with Potts scissors.  The endarterectomy was extended onto the bifurcation.  The external carotid was endarterectomized with eversion technique and the  internal carotid artery was endarterectomized with open fashion.  Remaining  atheromatous debris was removed from the endarterectomy plane.  The decision  was made to resect approximately 1.5 cm of common carotid artery.  Four 6-0  Prolene sutures were used for stay sutures and 1.5 cm of common carotid  artery was resected.  The common carotid artery was then sewn end-to-end to  itself with a running 6-0 Prolene suture.  Finally a Dacron Finesse  Hemashield patch was brought onto the field and sewn as a patch angioplasty  with a running 6-0 Prolene suture.  Prior to completion of the anastomosis,  the shunt was removed and the usual flushing maneuvers were undertaken.  The  anastomosis was then completed. The external, followed by the common and  finally, the internal carotid artery occlusion clamp was removed.  Excellent  flow characteristics were noted with the hand held Doppler in the internal  and external carotid arteries.  The patient was given 50 mg of Protamine to  reverse the heparin.  The wounds were irrigated with saline,   Hemostasis  with electrocautery.  Wounds were closed with several interrupted 3-0 Vicryl  sutures to reapproximate the sternocleidomastoid muscle over the carotid  sheath.  Next, the platysma was closed with running 3-0 suture and finally  the skin was closed with 4-0 subcuticular Vicryl stitch.  Sterile dressing  was applied.  The patient was awakened neurologically intact in the  operating room and transferred to the recovery room in stable condition.                                               Larina Earthly, M.D.    TFE/MEDQ  D:  10/09/2003  T:  10/09/2003  Job:  [id number]

## 2010-11-21 LAB — BASIC METABOLIC PANEL
BUN: 17
CO2: 33 — ABNORMAL HIGH
Calcium: 10
Chloride: 100
Creatinine, Ser: 0.76
GFR calc Af Amer: >60
GFR calc non Af Amer: >60
Glucose, Bld: 86
Potassium: 3.7
Sodium: 139

## 2010-11-21 LAB — HEMOGLOBIN AND HEMATOCRIT, BLOOD
HCT: 37.8
Hemoglobin: 12.8

## 2010-12-12 ENCOUNTER — Other Ambulatory Visit (INDEPENDENT_AMBULATORY_CARE_PROVIDER_SITE_OTHER): Payer: 59 | Admitting: *Deleted

## 2010-12-12 DIAGNOSIS — I6529 Occlusion and stenosis of unspecified carotid artery: Secondary | ICD-10-CM

## 2010-12-12 DIAGNOSIS — Z48812 Encounter for surgical aftercare following surgery on the circulatory system: Secondary | ICD-10-CM

## 2010-12-18 NOTE — Procedures (Unsigned)
CAROTID DUPLEX EXAM  INDICATION:  Carotid endarterectomy.  HISTORY: Diabetes:  No. Cardiac:  No. Hypertension:  Yes. Smoking:  No. Previous Surgery:  Right carotid endarterectomy with resection of redundant CCA on 10/09/2003. CV History:  Currently occasional dizziness. Amaurosis Fugax No, Paresthesias No, Hemiparesis No                                      RIGHT             LEFT Brachial systolic pressure:         164               160 Brachial Doppler waveforms:         Normal            Normal Vertebral direction of flow:        Antegrade         Antegrade DUPLEX VELOCITIES (cm/sec) CCA peak systolic                   85                84 ECA peak systolic                   115               248 ICA peak systolic                   126               88 ICA end diastolic                   22                22 PLAQUE MORPHOLOGY:                  Heterogeneous     Calcific PLAQUE AMOUNT:                      Mild              Mild PLAQUE LOCATION:                    CCA               ICA/ECA/CCA  IMPRESSION: 1. Patent right carotid endarterectomy site with no hemodynamically     stenoses of the bilateral internal carotid arteries. 2. Left external carotid artery stenosis noted. 3. No significant change noted when compared to the previous exam on     12/12/2009.  ___________________________________________ Larina Earthly, M.D.  CH/MEDQ  D:  12/15/2010  T:  12/15/2010  Job:  [id number]

## 2010-12-19 ENCOUNTER — Encounter: Payer: Self-pay | Admitting: Vascular Surgery

## 2010-12-22 ENCOUNTER — Other Ambulatory Visit: Payer: Self-pay

## 2010-12-22 DIAGNOSIS — Z48812 Encounter for surgical aftercare following surgery on the circulatory system: Secondary | ICD-10-CM

## 2010-12-22 DIAGNOSIS — I6529 Occlusion and stenosis of unspecified carotid artery: Secondary | ICD-10-CM

## 2010-12-23 ENCOUNTER — Other Ambulatory Visit: Payer: Self-pay | Admitting: Vascular Surgery

## 2010-12-23 DIAGNOSIS — I6529 Occlusion and stenosis of unspecified carotid artery: Secondary | ICD-10-CM

## 2010-12-23 DIAGNOSIS — Z48812 Encounter for surgical aftercare following surgery on the circulatory system: Secondary | ICD-10-CM

## 2011-01-08 ENCOUNTER — Emergency Department (HOSPITAL_COMMUNITY): Payer: 59

## 2011-01-08 ENCOUNTER — Inpatient Hospital Stay (HOSPITAL_COMMUNITY)
Admission: EM | Admit: 2011-01-08 | Discharge: 2011-01-15 | DRG: 286 | Disposition: A | Discharge status: Home / Self Care | Payer: 59 | Attending: Internal Medicine | Admitting: Internal Medicine

## 2011-01-08 ENCOUNTER — Other Ambulatory Visit: Payer: Self-pay

## 2011-01-08 ENCOUNTER — Encounter: Payer: Self-pay | Admitting: Emergency Medicine

## 2011-01-08 DIAGNOSIS — M1990 Unspecified osteoarthritis, unspecified site: Secondary | ICD-10-CM | POA: Diagnosis present

## 2011-01-08 DIAGNOSIS — D649 Anemia, unspecified: Secondary | ICD-10-CM | POA: Diagnosis present

## 2011-01-08 DIAGNOSIS — I4891 Unspecified atrial fibrillation: Secondary | ICD-10-CM | POA: Diagnosis present

## 2011-01-08 DIAGNOSIS — R110 Nausea: Secondary | ICD-10-CM | POA: Diagnosis present

## 2011-01-08 DIAGNOSIS — R109 Unspecified abdominal pain: Secondary | ICD-10-CM | POA: Diagnosis present

## 2011-01-08 DIAGNOSIS — I509 Heart failure, unspecified: Secondary | ICD-10-CM

## 2011-01-08 DIAGNOSIS — Z91199 Patient's noncompliance with other medical treatment and regimen due to unspecified reason: Secondary | ICD-10-CM

## 2011-01-08 DIAGNOSIS — J9600 Acute respiratory failure, unspecified whether with hypoxia or hypercapnia: Secondary | ICD-10-CM

## 2011-01-08 DIAGNOSIS — J13 Pneumonia due to Streptococcus pneumoniae: Secondary | ICD-10-CM

## 2011-01-08 DIAGNOSIS — I340 Nonrheumatic mitral (valve) insufficiency: Secondary | ICD-10-CM

## 2011-01-08 DIAGNOSIS — Z7982 Long term (current) use of aspirin: Secondary | ICD-10-CM

## 2011-01-08 DIAGNOSIS — R0602 Shortness of breath: Secondary | ICD-10-CM | POA: Diagnosis present

## 2011-01-08 DIAGNOSIS — I10 Essential (primary) hypertension: Secondary | ICD-10-CM | POA: Diagnosis present

## 2011-01-08 DIAGNOSIS — G8929 Other chronic pain: Secondary | ICD-10-CM | POA: Diagnosis present

## 2011-01-08 DIAGNOSIS — M810 Age-related osteoporosis without current pathological fracture: Secondary | ICD-10-CM | POA: Diagnosis present

## 2011-01-08 DIAGNOSIS — I700 Atherosclerosis of aorta: Secondary | ICD-10-CM | POA: Diagnosis present

## 2011-01-08 DIAGNOSIS — K5790 Diverticulosis of intestine, part unspecified, without perforation or abscess without bleeding: Secondary | ICD-10-CM | POA: Insufficient documentation

## 2011-01-08 DIAGNOSIS — E7800 Pure hypercholesterolemia, unspecified: Secondary | ICD-10-CM | POA: Diagnosis present

## 2011-01-08 DIAGNOSIS — K5900 Constipation, unspecified: Secondary | ICD-10-CM | POA: Diagnosis present

## 2011-01-08 DIAGNOSIS — M549 Dorsalgia, unspecified: Secondary | ICD-10-CM | POA: Diagnosis present

## 2011-01-08 DIAGNOSIS — E039 Hypothyroidism, unspecified: Secondary | ICD-10-CM | POA: Diagnosis present

## 2011-01-08 DIAGNOSIS — I059 Rheumatic mitral valve disease, unspecified: Secondary | ICD-10-CM | POA: Diagnosis present

## 2011-01-08 DIAGNOSIS — K5792 Diverticulitis of intestine, part unspecified, without perforation or abscess without bleeding: Secondary | ICD-10-CM

## 2011-01-08 DIAGNOSIS — I119 Hypertensive heart disease without heart failure: Secondary | ICD-10-CM | POA: Diagnosis present

## 2011-01-08 DIAGNOSIS — Z9119 Patient's noncompliance with other medical treatment and regimen: Secondary | ICD-10-CM

## 2011-01-08 DIAGNOSIS — K5730 Diverticulosis of large intestine without perforation or abscess without bleeding: Secondary | ICD-10-CM | POA: Diagnosis present

## 2011-01-08 DIAGNOSIS — J189 Pneumonia, unspecified organism: Secondary | ICD-10-CM

## 2011-01-08 DIAGNOSIS — E876 Hypokalemia: Secondary | ICD-10-CM

## 2011-01-08 DIAGNOSIS — J90 Pleural effusion, not elsewhere classified: Secondary | ICD-10-CM

## 2011-01-08 DIAGNOSIS — M519 Unspecified thoracic, thoracolumbar and lumbosacral intervertebral disc disorder: Secondary | ICD-10-CM

## 2011-01-08 DIAGNOSIS — I5031 Acute diastolic (congestive) heart failure: Secondary | ICD-10-CM | POA: Diagnosis present

## 2011-01-08 DIAGNOSIS — R0902 Hypoxemia: Secondary | ICD-10-CM

## 2011-01-08 DIAGNOSIS — I959 Hypotension, unspecified: Secondary | ICD-10-CM | POA: Diagnosis not present

## 2011-01-08 DIAGNOSIS — J9601 Acute respiratory failure with hypoxia: Secondary | ICD-10-CM | POA: Diagnosis present

## 2011-01-08 DIAGNOSIS — J810 Acute pulmonary edema: Secondary | ICD-10-CM

## 2011-01-08 DIAGNOSIS — I5033 Acute on chronic diastolic (congestive) heart failure: Principal | ICD-10-CM | POA: Diagnosis present

## 2011-01-08 DIAGNOSIS — N289 Disorder of kidney and ureter, unspecified: Secondary | ICD-10-CM

## 2011-01-08 HISTORY — DX: Essential (primary) hypertension: I10

## 2011-01-08 HISTORY — DX: Heart failure, unspecified: I50.9

## 2011-01-08 HISTORY — DX: Pure hypercholesterolemia, unspecified: E78.00

## 2011-01-08 HISTORY — DX: Unspecified thoracic, thoracolumbar and lumbosacral intervertebral disc disorder: M51.9

## 2011-01-08 HISTORY — DX: Hypothyroidism, unspecified: E03.9

## 2011-01-08 HISTORY — DX: Paroxysmal atrial fibrillation: I48.0

## 2011-01-08 HISTORY — DX: Unspecified osteoarthritis, unspecified site: M19.90

## 2011-01-08 HISTORY — DX: Anemia, unspecified: D64.9

## 2011-01-08 LAB — COMPREHENSIVE METABOLIC PANEL
AST: 32 U/L (ref 0–37)
Alkaline Phosphatase: 119 U/L — ABNORMAL HIGH (ref 39–117)
BUN: 34 mg/dL — ABNORMAL HIGH (ref 6–23)
CO2: 29 mEq/L (ref 19–32)
Chloride: 92 mEq/L — ABNORMAL LOW (ref 96–112)
Creatinine, Ser: 1.25 mg/dL — ABNORMAL HIGH (ref 0.50–1.10)
GFR calc non Af Amer: 38 mL/min — ABNORMAL LOW (ref >90)
Total Bilirubin: 0.4 mg/dL (ref 0.3–1.2)

## 2011-01-08 LAB — CBC
HCT: 35.6 % — ABNORMAL LOW (ref 36.0–46.0)
Platelets: 249 10*3/uL (ref 150–400)
RDW: 13.4 % (ref 11.5–15.5)
WBC: 8 10*3/uL (ref 4.0–10.5)

## 2011-01-08 LAB — BLOOD GAS, ARTERIAL
Bicarbonate: 27.1 mEq/L — ABNORMAL HIGH (ref 20.0–24.0)
Expiratory PAP: 5
FIO2: 1 %
Inspiratory PAP: 10
Mode: POSITIVE
O2 Saturation: 96.6 %
Patient temperature: 98.6
TCO2: 24.6 mmol/L (ref 0–100)
pCO2 arterial: 39.2 mmHg (ref 35.0–45.0)
pH, Arterial: 7.454 — ABNORMAL HIGH (ref 7.350–7.400)
pO2, Arterial: 85 mmHg (ref 80.0–100.0)

## 2011-01-08 LAB — CARDIAC PANEL(CRET KIN+CKTOT+MB+TROPI)
Relative Index: INVALID (ref 0.0–2.5)
Total CK: 60 U/L (ref 7–177)

## 2011-01-08 LAB — PRO B NATRIURETIC PEPTIDE: Pro B Natriuretic peptide (BNP): 5870 pg/mL — ABNORMAL HIGH (ref 0–450)

## 2011-01-08 LAB — URINALYSIS, ROUTINE W REFLEX MICROSCOPIC
Nitrite: NEGATIVE
Specific Gravity, Urine: 1.023 (ref 1.005–1.030)
Urobilinogen, UA: 1 mg/dL (ref 0.0–1.0)

## 2011-01-08 LAB — LIPASE, BLOOD: Lipase: 8 U/L — ABNORMAL LOW (ref 11–59)

## 2011-01-08 LAB — DIFFERENTIAL
Basophils Absolute: 0.1 10*3/uL (ref 0.0–0.1)
Lymphocytes Relative: 10 % — ABNORMAL LOW (ref 12–46)
Neutro Abs: 6.7 10*3/uL (ref 1.7–7.7)

## 2011-01-08 LAB — PROCALCITONIN: Procalcitonin: <0.1 ng/mL

## 2011-01-08 LAB — POCT I-STAT TROPONIN I: Troponin i, poc: 0.02 ng/mL (ref 0.00–0.08)

## 2011-01-08 MED ORDER — FENTANYL CITRATE 0.05 MG/ML IJ SOLN
12.5000 ug | INTRAMUSCULAR | Status: AC | PRN
  Administered 2011-01-08: 12.5 ug via INTRAVENOUS
  Administered 2011-01-08: 100 ug via INTRAVENOUS
  Filled 2011-01-08 (×2): qty 2

## 2011-01-08 MED ORDER — SODIUM CHLORIDE 0.9 % IV SOLN
250.0000 mL | INTRAVENOUS | Status: DC | PRN
  Administered 2011-01-12: 07:00:00 via INTRAVENOUS

## 2011-01-08 MED ORDER — ASPIRIN 81 MG PO CHEW
324.0000 mg | CHEWABLE_TABLET | ORAL | Status: AC
  Administered 2011-01-08: 324 mg via ORAL
  Filled 2011-01-08: qty 1

## 2011-01-08 MED ORDER — NITROGLYCERIN 2 % TD OINT
1.0000 [in_us] | TOPICAL_OINTMENT | Freq: Once | TRANSDERMAL | Status: AC
  Administered 2011-01-08: 1 [in_us] via TOPICAL
  Filled 2011-01-08: qty 30

## 2011-01-08 MED ORDER — NALOXONE HCL 0.4 MG/ML IJ SOLN
INTRAMUSCULAR | Status: AC
  Filled 2011-01-08: qty 1

## 2011-01-08 MED ORDER — NALOXONE HCL 0.4 MG/ML IJ SOLN
0.4000 mg | Freq: Once | INTRAMUSCULAR | Status: AC
  Administered 2011-01-08: 0.4 mg via INTRAVENOUS

## 2011-01-08 MED ORDER — SODIUM CHLORIDE 0.9 % IV SOLN
INTRAVENOUS | Status: DC
  Administered 2011-01-08: 11:00:00 via INTRAVENOUS

## 2011-01-08 MED ORDER — ASPIRIN EC 81 MG PO TBEC
81.0000 mg | DELAYED_RELEASE_TABLET | Freq: Every day | ORAL | Status: DC
  Administered 2011-01-09 – 2011-01-13 (×5): 81 mg via ORAL
  Filled 2011-01-08 (×5): qty 1

## 2011-01-08 MED ORDER — DEXTROSE 5 % IV SOLN
1.0000 g | Freq: Once | INTRAVENOUS | Status: AC
  Administered 2011-01-08: 1 g via INTRAVENOUS
  Filled 2011-01-08: qty 10

## 2011-01-08 MED ORDER — ASPIRIN 300 MG RE SUPP: 300.0000 mg | RECTAL | Status: AC

## 2011-01-08 MED ORDER — VANCOMYCIN HCL IN DEXTROSE 1-5 GM/200ML-% IV SOLN
1000.0000 mg | INTRAVENOUS | Status: DC
  Filled 2011-01-08: qty 200

## 2011-01-08 MED ORDER — ONDANSETRON HCL 4 MG/2ML IJ SOLN
4.0000 mg | Freq: Once | INTRAMUSCULAR | Status: AC
Start: 2011-01-08 — End: 2011-01-08
  Administered 2011-01-08: 4 mg via INTRAVENOUS
  Filled 2011-01-08: qty 2

## 2011-01-08 MED ORDER — FUROSEMIDE 10 MG/ML IJ SOLN
40.0000 mg | Freq: Once | INTRAMUSCULAR | Status: AC
  Administered 2011-01-08: 40 mg via INTRAVENOUS
  Filled 2011-01-08: qty 4

## 2011-01-08 MED ORDER — DIPHENHYDRAMINE HCL 12.5 MG/5ML PO ELIX
6.2500 mg | ORAL_SOLUTION | Freq: Three times a day (TID) | ORAL | Status: DC | PRN
  Administered 2011-01-08: 12.5 mg via ORAL
  Filled 2011-01-08 (×2): qty 5

## 2011-01-08 MED ORDER — LEVOTHYROXINE SODIUM 75 MCG PO TABS
75.0000 ug | ORAL_TABLET | Freq: Every day | ORAL | Status: DC
  Administered 2011-01-09 – 2011-01-15 (×7): 75 ug via ORAL
  Filled 2011-01-08 (×7): qty 1

## 2011-01-08 MED ORDER — FUROSEMIDE 10 MG/ML IJ SOLN
80.0000 mg | INTRAMUSCULAR | Status: AC
  Administered 2011-01-08: 80 mg via INTRAVENOUS
  Filled 2011-01-08: qty 8

## 2011-01-08 MED ORDER — MOXIFLOXACIN HCL IN NACL 400 MG/250ML IV SOLN
400.0000 mg | Freq: Once | INTRAVENOUS | Status: AC
  Administered 2011-01-08: 400 mg via INTRAVENOUS
  Filled 2011-01-08: qty 250

## 2011-01-08 NOTE — ED Notes (Signed)
Pt has recently lost a sibling and went to [facility] on mon for not feeling well, not eating or drinking, they took bood work and called them at home today to tell them her wbc was elevated, pt arrived in triage has sats at 70% ra not on ox at home, placed on nonrebreather sats up to 80% pt pale alert to person and place, daughter states that she is usally active and walks on her own.

## 2011-01-08 NOTE — ED Notes (Signed)
Patient states that she is nauseated. Dr Clarene Duke ordered Zofran

## 2011-01-08 NOTE — ED Notes (Signed)
Admitting md at bedside

## 2011-01-08 NOTE — ED Notes (Signed)
Patient appears anxious due to BiPap mask. Informed Dr. Clarene Duke, will continue to monitor after Lasix and Nitro has had time to be effective.

## 2011-01-08 NOTE — ED Notes (Signed)
Family at bedside. 

## 2011-01-08 NOTE — Consult Note (Signed)
Reason for Consult: Acute respiratory failure Referring Physician: [facility] Mahala Menghini)  Desiree [name] is an 77 y.o. female.  HPI: Much of the history is provided by the patient's daughter. She dates her mother's onset of illness to 11/4 when the patient's brother was buried. Desiree [name] has reportedly suffered from general malaise since that time. She has also not reliably complied with her usual medical regimen - in particular, Miralax. She has consequently develop constipation and nausea (no vomiting). On the day prior to admission, the patient's daughter noted that Desiree [name] seemed SOB with minimal exertion. This progressively worsened and she was brought to ED for further eval where she was found to be dyspneic and hypoxic. She was transiently placed on NPPV. At the time of my evaluation, she was comfortable on VM @ 40%. She denies purulent sputum, hemoptysis, CP, LE edema presently but notes that she has had some increased ankle edema recently. Sh also admits to orthopnea but denies PND.   Past Medical History  Diagnosis Date  . Diverticulitis - chronic constipation       . Hypertension       . Hypothyroid   . Anemia   . Hypercholesterolemia   . Hypothyroidism   . Osteoarthritis with chronic pain syndrome       . Paroxysmal atrial fibrillation [name] - not on warfarin       . CHF (congestive heart failure), NYHA class II             "Heart Murmur"  Past Surgical History  Procedure Date  . Carotid endarterectomy   . Carpal tunnel release   . Rotator cuff repair     2 on right, 1 on left  . Abdominal hysterectomy   . Cataract extraction   . Knee surgery     bilateral    History reviewed. No pertinent family history.  Social History:  reports that she has never smoked. She has never used smokeless tobacco. She reports that she does not drink alcohol or use illicit drugs.  Allergies:  Allergies  Allergen Reactions  . Celebrex (Celecoxib) Other (See Comments)   Reaction=anemia/leukopenia  . Diovan (Valsartan) Other (See Comments)    Reaction=increase potassium/acute renal failure    Medications: Reviewed  Results for orders placed during the hospital encounter of 01/08/11 (from the past 48 hour(s))  CBC     Status: Abnormal   Collection Time   01/08/11 11:00 AM      Component Value Range Comment   WBC 8.0  4.0 - 10.5 (K/uL)    RBC 3.78 (*) 3.87 - 5.11 (MIL/uL)    Hemoglobin 11.7 (*) 12.0 - 15.0 (g/dL)    HCT 16.1 (*) 09.6 - 46.0 (%)    MCV 94.2  78.0 - 100.0 (fL)    MCH 31.0  26.0 - 34.0 (pg)    MCHC 32.9  30.0 - 36.0 (g/dL)    RDW 04.5  40.9 - 81.1 (%)    Platelets 249  150 - 400 (K/uL)   DIFFERENTIAL     Status: Abnormal   Collection Time   01/08/11 11:00 AM      Component Value Range Comment   Neutrophils Relative 83 (*) 43 - 77 (%)    Neutro Abs 6.7  1.7 - 7.7 (K/uL)    Lymphocytes Relative 10 (*) 12 - 46 (%)    Lymphs Abs 0.8  0.7 - 4.0 (K/uL)    Monocytes Relative 5  3 - 12 (%)  Monocytes Absolute 0.4  0.1 - 1.0 (K/uL)    Eosinophils Relative 2  0 - 5 (%)    Eosinophils Absolute 0.2  0.0 - 0.7 (K/uL)    Basophils Relative 1  0 - 1 (%)    Basophils Absolute 0.1  0.0 - 0.1 (K/uL)   COMPREHENSIVE METABOLIC PANEL     Status: Abnormal   Collection Time   01/08/11 11:00 AM      Component Value Range Comment   Sodium 131 (*) 135 - 145 (mEq/L)    Potassium 4.3  3.5 - 5.1 (mEq/L)    Chloride 92 (*) 96 - 112 (mEq/L)    CO2 29  19 - 32 (mEq/L)    Glucose, Bld 155 (*) 70 - 99 (mg/dL)    BUN 34 (*) 6 - 23 (mg/dL)    Creatinine, Ser 1.61 (*) 0.50 - 1.10 (mg/dL)    Calcium 9.6  8.4 - 10.5 (mg/dL)    Total Protein 7.2  6.0 - 8.3 (g/dL)    Albumin 3.0 (*) 3.5 - 5.2 (g/dL)    AST 32  0 - 37 (U/L)    ALT 52 (*) 0 - 35 (U/L)    Alkaline Phosphatase 119 (*) 39 - 117 (U/L)    Total Bilirubin 0.4  0.3 - 1.2 (mg/dL)    GFR calc non Af Amer 38 (*) >90 (mL/min)    GFR calc Af Amer 44 (*) >90 (mL/min)   LIPASE, BLOOD     Status: Abnormal    Collection Time   01/08/11 11:00 AM      Component Value Range Comment   Lipase 8 (*) 11 - 59 (U/L)   PROCALCITONIN     Status: Normal   Collection Time   01/08/11 11:00 AM      Component Value Range Comment   Procalcitonin <0.10     PRO B NATRIURETIC PEPTIDE     Status: Abnormal   Collection Time   01/08/11 11:02 AM      Component Value Range Comment   BNP, POC 5870.0 (*) 0 - 450 (pg/mL)   POCT I-STAT TROPONIN I     Status: Normal   Collection Time   01/08/11 11:14 AM      Component Value Range Comment   Troponin i, poc 0.02  0.00 - 0.08 (ng/mL)    Comment 3            URINALYSIS, ROUTINE W REFLEX MICROSCOPIC     Status: Normal   Collection Time   01/08/11 11:49 AM      Component Value Range Comment   Color, Urine YELLOW  YELLOW     Appearance CLEAR  CLEAR     Specific Gravity, Urine 1.023  1.005 - 1.030     pH 5.5  5.0 - 8.0     Glucose, UA NEGATIVE  NEGATIVE (mg/dL)    Hgb urine dipstick NEGATIVE  NEGATIVE     Bilirubin Urine NEGATIVE  NEGATIVE     Ketones, ur NEGATIVE  NEGATIVE (mg/dL)    Protein, ur NEGATIVE  NEGATIVE (mg/dL)    Urobilinogen, UA 1.0  0.0 - 1.0 (mg/dL)    Nitrite NEGATIVE  NEGATIVE     Leukocytes, UA NEGATIVE  NEGATIVE  MICROSCOPIC NOT DONE ON URINES WITH NEGATIVE PROTEIN, BLOOD, LEUKOCYTES, NITRITE, OR GLUCOSE <1000 mg/dL.  OCCULT BLOOD, POC DEVICE     Status: Normal   Collection Time   01/08/11 11:58 AM      Component Value Range  Comment   Fecal Occult Bld NEGATIVE     LACTIC ACID, PLASMA     Status: Normal   Collection Time   01/08/11  1:30 PM      Component Value Range Comment   Lactic Acid, Venous 2.0  0.5 - 2.2 (mmol/L)   BLOOD GAS, ARTERIAL     Status: Abnormal   Collection Time   01/08/11  2:15 PM      Component Value Range Comment   FIO2 1.00      Delivery systems VENTILATOR      Mode BILEVEL POSITIVE AIRWAY PRESSURE      Inspiratory PAP 10      Expiratory PAP 5.0      pH, Arterial 7.465 (*) 7.350 - 7.400     pCO2 arterial 39.5   35.0 - 45.0 (mmHg)    pO2, Arterial 85.0  80.0 - 100.0 (mmHg)    Bicarbonate 28.0 (*) 20.0 - 24.0 (mEq/L)    TCO2 25.3  0 - 100 (mmol/L)    Acid-Base Excess 4.4 (*) 0.0 - 2.0 (mmol/L)    O2 Saturation 96.6      Patient temperature 98.6      Collection site RIGHT RADIAL      Drawn by [id number]      Sample type ARTERIAL      Allens test (pass/fail) PASS  PASS    CARDIAC PANEL(CRET KIN+CKTOT+MB+TROPI)     Status: Normal   Collection Time   01/08/11  5:18 PM      Component Value Range Comment   Total CK 60  7 - 177 (U/L)    CK, MB 3.7  0.3 - 4.0 (ng/mL)    Troponin I <0.30  <0.30 (ng/mL)    Relative Index RELATIVE INDEX IS INVALID  0.0 - 2.5    BLOOD GAS, ARTERIAL     Status: Abnormal   Collection Time   01/08/11  5:23 PM      Component Value Range Comment   FIO2 .50      Delivery systems OXYGEN MASK      pH, Arterial 7.454 (*) 7.350 - 7.400     pCO2 arterial 39.2  35.0 - 45.0 (mmHg)    pO2, Arterial 51.0 (*) 80.0 - 100.0 (mmHg)    Bicarbonate 27.1 (*) 20.0 - 24.0 (mEq/L)    TCO2 24.6  0 - 100 (mmol/L)    Acid-Base Excess 3.4 (*) 0.0 - 2.0 (mmol/L)    O2 Saturation 83.5      Patient temperature 98.6      Collection site RIGHT RADIAL      Drawn by [id number]      Sample type ARTERIAL      Allens test (pass/fail) PASS  PASS      Dg Chest Right Decubitus  01/08/2011  *RADIOLOGY REPORT*  Clinical Data: Shortness of breath and weakness  CHEST - RIGHT DECUBITUS  Comparison: 01/08/2011 chest radiograph  Findings: Detail is obscured by the patient's arms.  Bilateral free flowing moderate sized pleural effusions are again noted.  The right lung is obscured by overlying soft tissue.  The visualized left lung field is clear but the costophrenic angle is omitted from the field of view.  IMPRESSION: Bilateral moderate free flowing pleural effusions.  Original Report Authenticated By: Harrel Lemon, M.D.   Dg Chest Port 1 View  01/08/2011  *RADIOLOGY REPORT*  Clinical Data: Shortness of  breath, anxiety, nausea  PORTABLE CHEST - 1 VIEW  Comparison: Chest x-ray of 09/27/2007  Findings: The lungs are poorly aerated with airspace disease bilaterally.  There appears to be a right pleural effusion present as well.  These findings most likely represent pneumonia although edema cannot be excluded.  The heart is mildly enlarged.  No bony abnormality is seen.  IMPRESSION: Bilateral airspace disease with right effusion.  Consider pneumonia versus edema.  Original Report Authenticated By: Juline Patch, M.D.    Review of Systems  Constitutional: Positive for fever (endorses mild fever no meds). Malaise/fatigue: states had weight loss a on and off.  Eyes: Negative.   Respiratory: Negative for cough, hemoptysis and sputum production. Shortness of breath: endorses shortness of breath.   Cardiovascular: Chest pain: occasional. Has not had any recently. Orthopnea: endorses that she'suse 2 pillows all of her life however states that she's been little bit more short of breath  Leg swelling: endorses swelling of her lower extremity is also endorsestightening of her clothes and tightening of the rings of her fingers. Now for 6 months.       Sates that she has not been able to walk as far distance as she used to in the past however is still walking half an hour a day. States that she go slower than she used to.  Gastrointestinal: Positive for nausea ( had nausea on Monday but did not vomit). Negative for vomiting. Abdominal pain: chronic abdominal pain with alternating diarrhea and constipation. No specific weight.  Genitourinary: Negative.   Musculoskeletal: Negative.   Neurological: Dizziness: occasional dizziness which is long-standing. Weakness: has been weak for the past couple of days.  Endo/Heme/Allergies: Negative.   Psychiatric/Behavioral: Negative.   All other systems reviewed and are negative.   Blood pressure 108/47, pulse 83, temperature 99.4 F (37.4 C), temperature source Oral, resp.  rate 26, height 4\' 11"  (1.499 m), weight 61.689 kg (136 lb), SpO2 95.00%.  Physical Exam  Constitutional: She is oriented to person, place, and time. She appears well-developed and well-nourished. No distress (distress to this evening theshort of breath however able to speak in full sentences. Patient was on BiPAP for 2 hours prior to me seeing her).  HENT:  Head: Normocephalic and atraumatic.  Eyes: Conjunctivae are normal. Pupils are equal, round, and reactive to light. Left eye exhibits no discharge. No scleral icterus.  Neck: Normal range of motion. Neck supple. No JVD present. No tracheal deviation present. No thyromegaly present.  Cardiovascular: Normal rate and regular rhythm.   Murmur (holosystolic 306 murmur right and left upper sternal edge with radiation to patent) heard.      3/6 holosyst M  Respiratory:       Dull to perc 1/3 up on R and 1/4 up on L. Diminished BS in L base. Bronchial BS and egophony in R base  GI: Soft. Bowel sounds are normal. She exhibits no distension. There is no tenderness. There is no rebound and no guarding.  Musculoskeletal: Normal range of motion. She exhibits no edema and no tenderness.  Lymphadenopathy:    She has no cervical adenopathy.  Neurological: She is alert and oriented to person, place, and time. She has normal reflexes. She displays normal reflexes. No cranial nerve deficit. She exhibits normal muscle tone. Coordination normal.  Skin: Skin is warm and dry. No rash noted. No erythema.  Psychiatric: She has a normal mood and affect. Her behavior is normal. Judgment and thought content normal.    Assessment/Plan:  Patient Active Hospital Problem List: Acute respiratory failure with hypoxia (01/08/2011)   Assessment: Suspect  PNA > CHF based on exam (does not appear volume overloaded) but has significant MR by exam and could be sig component of edema based on valvular dz. Echo ordered.    Plan: Discussed with Dr Mahala Menghini. Recommend abx and  cautious diuresis as permitted by BP and renal function. F/U BNP and Echo.   Pleural effusions: Consider thora 11/16   She is at high risk of deterioration and need for intubation. She is highly functional @ baseline and her acute illness is likely reversible so it would be medically reasonable to proceed with vent support if she does indeed progress to need such. She needs ICU admission. If her status improves sufficiently by 11/16 AM that she can be downgraded to SDU status of Tele, she should remain on [facility] service. If she still requires ICU level of care, PCCM will assume her care as the primary service. I discussed this plan with Dr Lawana Chambers [facility] 01/08/2011, 11:08 PM

## 2011-01-08 NOTE — ED Notes (Signed)
Pt with 16 beat run of svt. md wert called and stated no new orders at this time. Will continue to monitor.

## 2011-01-08 NOTE — Progress Notes (Signed)
[facility] tm 2 will admit patient-PCCM will consult for now, per my discussion with Dr. Billie Lade admission orders please.  Shraga Custard,JAI

## 2011-01-08 NOTE — ED Notes (Signed)
Nausea resolved.

## 2011-01-08 NOTE — ED Notes (Signed)
Respiratory paged to wean pt off bipap per md mcmanus

## 2011-01-08 NOTE — ED Notes (Signed)
Patient transported to X-ray 

## 2011-01-08 NOTE — ED Notes (Signed)
Labs drawn by RN and sent to the lab.

## 2011-01-08 NOTE — Consult Note (Addendum)
Reason for Consult:Hypoxia and SOB Referring Physician: [name] is an 77 y.o. female.  HPI: Sunday 1 week ago buried her brohter  This past Sunday she was felling " terrible" and is on miralax she has chronic constipation secondary to opiates-had not taken that for 4 days-so she started to get a bit off Kilter-went to her doctor (Alm) on Monday and then was seen for the  abd pain-continued to feel tired and weak, and was very "grey" to her daughter and again felt very nauseatwed with Abd discomfiort and later on that evening, her breathing did not seem that "good" to her Daughter and her face was also swollen as well  Awoke this am and could barelty walk 15 feet without any other issue-called her Doc this am amd some blood work was done and was told that her WBC was elevated-Thought that she might have had Diverticulitis- status in the past and has had chronic issues with bowel problems since she was  younger female Seen briefly by daughter M<odnay night and her colour looked a little better initally   PMH-Has had bowel issues all her life  More recentlyIncontinent of stool for svereal months as well  Cardiologist Dr. Samson Frederic a heart murmur  Cholestrol  HTn  Arthritis in spine and Back-takes HC at home  PSHx-Has had rotator cuff surgery 3 x on the r or Left arm as well  Arthroscopic surgery on bioth knees as well  All  Diovan   Past Medical History  Diagnosis Date  . Diverticulitis     chronic issues with consitpation and diarrhea  . Hypertension     well controlled usually  . Hypothyroid   . Anemia   . Hypercholesterolemia   . Hypothyroidism   . Osteoarthritis     chronic-on multiple pain meds.  . Paroxysmal atrial fibrillation     not on coumadin-Cards=[facility]-saw him ?1 yr ago  . CHF (congestive heart failure), NYHA class II     has sudden onset decom CHF? 2/2 to PNA    Past Surgical History  Procedure Date  . Carotid endarterectomy   . Carpal tunnel  release   . Rotator cuff repair     2 on right, 1 on left  . Abdominal hysterectomy   . Cataract extraction   . Knee surgery     bilateral    History reviewed. No pertinent family history.  Social History:  reports that she has never smoked. She has never used smokeless tobacco. She reports that she does not drink alcohol or use illicit drugs.  Allergies:  Allergies  Allergen Reactions  . Celebrex (Celecoxib) Other (See Comments)    Reaction=anemia/leukopenia  . Diovan (Valsartan) Other (See Comments)    Reaction=increase potassium/acute renal failure    Medications:  I have reviewed the patient's current medications. Prior to Admission:  (Not in a hospital admission) Scheduled:   . furosemide  40 mg Intravenous Once  . furosemide  80 mg Intravenous STAT  . moxifloxacin  400 mg Intravenous Once  . naloxone (NARCAN) injection  0.4 mg Intravenous Once  . nitroGLYCERIN  1 inch Topical Once  . ondansetron (ZOFRAN) IV  4 mg Intravenous Once   ZOX:[id number]  Results for orders placed during the hospital encounter of 01/08/11 (from the past 48 hour(s))  CBC     Status: Abnormal   Collection Time   01/08/11 11:00 AM      Component Value Range Comment   WBC 8.0  4.0 -  10.5 (K/uL)    RBC 3.78 (*) 3.87 - 5.11 (MIL/uL)    Hemoglobin 11.7 (*) 12.0 - 15.0 (g/dL)    HCT 09.8 (*) 11.9 - 46.0 (%)    MCV 94.2  78.0 - 100.0 (fL)    MCH 31.0  26.0 - 34.0 (pg)    MCHC 32.9  30.0 - 36.0 (g/dL)    RDW 14.7  82.9 - 56.2 (%)    Platelets 249  150 - 400 (K/uL)   DIFFERENTIAL     Status: Abnormal   Collection Time   01/08/11 11:00 AM      Component Value Range Comment   Neutrophils Relative 83 (*) 43 - 77 (%)    Neutro Abs 6.7  1.7 - 7.7 (K/uL)    Lymphocytes Relative 10 (*) 12 - 46 (%)    Lymphs Abs 0.8  0.7 - 4.0 (K/uL)    Monocytes Relative 5  3 - 12 (%)    Monocytes Absolute 0.4  0.1 - 1.0 (K/uL)    Eosinophils Relative 2  0 - 5 (%)    Eosinophils Absolute 0.2  0.0 - 0.7 (K/uL)     Basophils Relative 1  0 - 1 (%)    Basophils Absolute 0.1  0.0 - 0.1 (K/uL)   COMPREHENSIVE METABOLIC PANEL     Status: Abnormal   Collection Time   01/08/11 11:00 AM      Component Value Range Comment   Sodium 131 (*) 135 - 145 (mEq/L)    Potassium 4.3  3.5 - 5.1 (mEq/L)    Chloride 92 (*) 96 - 112 (mEq/L)    CO2 29  19 - 32 (mEq/L)    Glucose, Bld 155 (*) 70 - 99 (mg/dL)    BUN 34 (*) 6 - 23 (mg/dL)    Creatinine, Ser 1.30 (*) 0.50 - 1.10 (mg/dL)    Calcium 9.6  8.4 - 10.5 (mg/dL)    Total Protein 7.2  6.0 - 8.3 (g/dL)    Albumin 3.0 (*) 3.5 - 5.2 (g/dL)    AST 32  0 - 37 (U/L)    ALT 52 (*) 0 - 35 (U/L)    Alkaline Phosphatase 119 (*) 39 - 117 (U/L)    Total Bilirubin 0.4  0.3 - 1.2 (mg/dL)    GFR calc non Af Amer 38 (*) >90 (mL/min)    GFR calc Af Amer 44 (*) >90 (mL/min)   LIPASE, BLOOD     Status: Abnormal   Collection Time   01/08/11 11:00 AM      Component Value Range Comment   Lipase 8 (*) 11 - 59 (U/L)   PROCALCITONIN     Status: Normal   Collection Time   01/08/11 11:00 AM      Component Value Range Comment   Procalcitonin <0.10     PRO B NATRIURETIC PEPTIDE     Status: Abnormal   Collection Time   01/08/11 11:02 AM      Component Value Range Comment   BNP, POC 5870.0 (*) 0 - 450 (pg/mL)   POCT I-STAT TROPONIN I     Status: Normal   Collection Time   01/08/11 11:14 AM      Component Value Range Comment   Troponin i, poc 0.02  0.00 - 0.08 (ng/mL)    Comment 3            URINALYSIS, ROUTINE W REFLEX MICROSCOPIC     Status: Normal   Collection Time   01/08/11  11:49 AM      Component Value Range Comment   Color, Urine YELLOW  YELLOW     Appearance CLEAR  CLEAR     Specific Gravity, Urine 1.023  1.005 - 1.030     pH 5.5  5.0 - 8.0     Glucose, UA NEGATIVE  NEGATIVE (mg/dL)    Hgb urine dipstick NEGATIVE  NEGATIVE     Bilirubin Urine NEGATIVE  NEGATIVE     Ketones, ur NEGATIVE  NEGATIVE (mg/dL)    Protein, ur NEGATIVE  NEGATIVE (mg/dL)    Urobilinogen,  UA 1.0  0.0 - 1.0 (mg/dL)    Nitrite NEGATIVE  NEGATIVE     Leukocytes, UA NEGATIVE  NEGATIVE  MICROSCOPIC NOT DONE ON URINES WITH NEGATIVE PROTEIN, BLOOD, LEUKOCYTES, NITRITE, OR GLUCOSE <1000 mg/dL.  OCCULT BLOOD, POC DEVICE     Status: Normal   Collection Time   01/08/11 11:58 AM      Component Value Range Comment   Fecal Occult Bld NEGATIVE     LACTIC ACID, PLASMA     Status: Normal   Collection Time   01/08/11  1:30 PM      Component Value Range Comment   Lactic Acid, Venous 2.0  0.5 - 2.2 (mmol/L)   BLOOD GAS, ARTERIAL     Status: Abnormal   Collection Time   01/08/11  2:15 PM      Component Value Range Comment   FIO2 1.00      Delivery systems VENTILATOR      Mode BILEVEL POSITIVE AIRWAY PRESSURE      Inspiratory PAP 10      Expiratory PAP 5.0      pH, Arterial 7.465 (*) 7.350 - 7.400     pCO2 arterial 39.5  35.0 - 45.0 (mmHg)    pO2, Arterial 85.0  80.0 - 100.0 (mmHg)    Bicarbonate 28.0 (*) 20.0 - 24.0 (mEq/L)    TCO2 25.3  0 - 100 (mmol/L)    Acid-Base Excess 4.4 (*) 0.0 - 2.0 (mmol/L)    O2 Saturation 96.6      Patient temperature 98.6      Collection site RIGHT RADIAL      Drawn by [id number]      Sample type ARTERIAL      Allens test (pass/fail) PASS  PASS      Dg Chest Port 1 View  01/08/2011  *RADIOLOGY REPORT*  Clinical Data: Shortness of breath, anxiety, nausea  PORTABLE CHEST - 1 VIEW  Comparison: Chest x-ray of 09/27/2007  Findings: The lungs are poorly aerated with airspace disease bilaterally.  There appears to be a right pleural effusion present as well.  These findings most likely represent pneumonia although edema cannot be excluded.  The heart is mildly enlarged.  No bony abnormality is seen.  IMPRESSION: Bilateral airspace disease with right effusion.  Consider pneumonia versus edema.  Original Report Authenticated By: Juline Patch, M.D.    Review of Systems  Constitutional: Positive for fever (endorses mild fever no meds). Malaise/fatigue: states had  weight loss a on and off.  Eyes: Negative.   Respiratory: Negative for cough, hemoptysis and sputum production. Shortness of breath: endorses shortness of breath.   Cardiovascular: Chest pain: occasional. Has not had any recently. Orthopnea: endorses that she'suse 2 pillows all of her life however states that she's been little bit more short of breath  Leg swelling: endorses swelling of her lower extremity is also endorsestightening of her clothes and tightening  of the rings of her fingers. Now for 6 months.       Sates that she has not been able to walk as far distance as she used to in the past however is still walking half an hour a day. States that she go slower than she used to.  Gastrointestinal: Positive for nausea ( had nausea on Monday but did not vomit). Negative for vomiting. Abdominal pain: chronic abdominal pain with alternating diarrhea and constipation. No specific weight.  Genitourinary: Negative.   Musculoskeletal: Negative.   Neurological: Dizziness: occasional dizziness which is long-standing. Weakness: has been weak for the past couple of days.  Endo/Heme/Allergies: Negative.   Psychiatric/Behavioral: Negative.   All other systems reviewed and are negative.   Blood pressure 143/72, pulse 94, temperature 99.4 F (37.4 C), temperature source Oral, resp. rate 21, SpO2 90.00%. Physical Exam  Constitutional: She is oriented to person, place, and time. She appears well-developed. Distressed: distress to this evening theshort of breath however able to speak in full sentences. Patient was on BiPAP for 2 hours prior to me seeing her.  HENT:  Head: Normocephalic.       Poor dentition neck supple JVD elevated to about 3 cm above clavicle on the right side  Eyes: Right eye exhibits no discharge. Left eye exhibits no discharge. No scleral icterus.  Neck: Normal range of motion. JVD present.  Cardiovascular: Normal rate and regular rhythm.  Exam reveals no gallop.   Murmur (holosystolic  306 murmur right and left upper sternal edge with radiation to patent) heard. Respiratory:       Decreased breath sounds on the right basal areas no egophony percussion note seems a bit more ago trachea is midline patient is able to verbalize however is not really able to stay off of the mask. Patient was on BiPAP initially at 100% that was placed on Ventimask 50% and her sats were in the 80s  GI: Soft.  Musculoskeletal: Normal range of motion.  Neurological: She is alert and oriented to person, place, and time. She has normal reflexes.  Skin: Skin is warm and dry. No rash noted.  Psychiatric: She has a normal mood and affect. Her behavior is normal. Judgment and thought content normal.  states she also some chest tightness about a week ago. This is new and has not had this in the past  Assessment/Plan:  Patient Active Hospital Problem List: Acute respiratory failure with hypoxia (01/08/2011)   Assessment:    Plan: Patient has acute respiratory failure with hypoxia likely secondary to multiple causes   patient may have acute decompensation of her car cardiac issues and we'll get a stat echocardiogram. Her history suggestive of the same and she states that over the past 3 munchies been able to do less and less physical activity which classifies [facility] 8223. She currently is significantly hypoxic and was placed on BiPAP transiently until he was seen by me. Patient is able to verbalize comfortably but does appear to drop down into O2 sats of the high 80s. I've consulted Dr. Sharee Pimple of pulmonary critical care who will come by and see the patient.  Patient categorically states that she would like everything done at present time but does not wish long-term mechanical ventilation this was confirmed with her as well as her healthcare power of attorney at bedside Charnell Khurana at phone number [phone]  Blood gas on Ventimask 50% showed that she indeed is hypoxic to 51%. I will alert critical care to this  issue  and if need be they will assume her care  HTN (hypertension) (01/08/2011)   Assessment: Controlled at present time will likely need when necessary medications for the same     Nausea alone (01/08/2011)   Assessment: Hold off for now on treatment of her chronic constipation. We will get serial lactate if she does not improve I would consider getting a CT angiogram of the abdomen to rule out mesenteric ischemia as this could also present with chronic abdominal pain. I would also like to get cardiac enzymes x1 given the nonspecific nature of her pain. She has a history of paroxysmal A. fib on Coumadin and this would be a risk factor for these intact history      Diverticulitis (01/08/2011)   Assessment: I bottom her antibiotic coverage for moxifloxacin which has already been given in the emergency room and added vancomycin. Given the fact that this may be diverticular in nature I will also add Flagyl 500 mg IV every 8 hours he is well. We will trend her lactate as well calcitonin as there is some evidence of critical care lecture that this correlates well with mortality    Pleural effusion due to CHF (congestive heart failure) (01/08/2011)   Assessment: Patient likely has pleural effusion secondary CHF however we will delineate this further with the aid of a CT scan per pulmonary critical care when they see the patient. We will get a stat echocardiogram and I already ordered another stat dose of 80 mg IV Lasix in addition to the 40 already given in the emergency room. Her BNP was over 5000 which likely is due to CHF exacerbation for unknown etiology likely from pneumonia.    Abdominal pain (01/08/2011)   Assessment: See above discussion      I spent over one hour critical care time seen the patient including 30 minutes in direct face-to-face time and 10 minutes consultation with critical care specialist. I'm available until patient is seen by critical care but I request kindly that critical care  takeover and manage this patient.  Pulmonary critical care help appreciated in advance.   Robbye Dede,JAI 01/08/2011, 5:20 PM

## 2011-01-08 NOTE — ED Notes (Signed)
Vital signs stable. 

## 2011-01-08 NOTE — ED Notes (Signed)
Pt returned from xray. Pt with c/o itching md notified and aware

## 2011-01-08 NOTE — ED Notes (Signed)
Called md concerning pt's admission orders. md in report secretary stated she would get md to call back. Family informed.

## 2011-01-08 NOTE — ED Notes (Signed)
Patient tolerating BiPap well. Chest Xray obtained. Blood obtained.

## 2011-01-08 NOTE — ED Provider Notes (Signed)
History     CSN: [id number] Arrival date & time: 01/08/2011 10:26 AM   First MD Initiated Contact with Patient 01/08/11 1050      Chief Complaint  Patient presents with  . Shortness of Breath     Patient is a 77 y.o. female presenting with shortness of breath. The history is provided by the patient and a relative. History Limited By: urgent need for intervention.  Shortness of Breath  Associated symptoms include shortness of breath.  Pt was seen at 1050.  Pt seen on arrival to ED exam room.  Per pt and family, c/o gradual onset and worsening of persistent SOB x3-4 days.  Has been assoc with generalized fatigue and weakness.  States she was eval by her PMD in the ofc PTA, then sent to the ED for further eval/admit.  Pt denies CP/palpitations, no cough, no fevers, no back pain, no abd pain, no N/V/D.   Past Medical History  Diagnosis Date  . Diverticulitis   . Hypertension   . Hypothyroid   . Anemia   . Hypercholesterolemia   . Paroxysmal atrial fibrillation   . Hypothyroidism   . Osteoarthritis     Past Surgical History  Procedure Date  . Carotid endarterectomy   . Carpal tunnel release   . Rotator cuff repair   . Abdominal hysterectomy   . Cataract extraction      History  Substance Use Topics  . Smoking status: Never Smoker   . Smokeless tobacco: Not on file  . Alcohol Use: No    Review of Systems  Unable to perform ROS: Other  Respiratory: Positive for shortness of breath.     Allergies  Celebrex and Diovan  Home Medications   Current Outpatient Rx  Name Route Sig Dispense Refill  . ASPIRIN 81 MG PO TABS Oral Take 81 mg by mouth daily.     . ATORVASTATIN CALCIUM 80 MG PO TABS Oral Take 80 mg by mouth at bedtime.     . OCUVITE PO TABS Oral Take 1 tablet by mouth daily.      . CELECOXIB 200 MG PO CAPS Oral Take 200 mg by mouth daily.     Marland Kitchen HYDROCHLOROTHIAZIDE 25 MG PO TABS Oral Take 25 mg by mouth daily.     Marland Kitchen HYDROCODONE-ACETAMINOPHEN 5-500 MG PO TABS  Oral Take 1 tablet by mouth every 6 (six) hours as needed. For pain     . LEVOTHYROXINE SODIUM 75 MCG PO TABS Oral Take 75 mcg by mouth daily.     Marland Kitchen METOPROLOL TARTRATE 100 MG PO TABS Oral Take 100 mg by mouth 2 (two) times daily.     Marland Kitchen ONDANSETRON 4 MG PO TBDP Oral Take 4 mg by mouth every 8 (eight) hours as needed.     Marland Kitchen POLYETHYLENE GLYCOL 3350 PO PACK Oral Take 17 g by mouth daily.     Marland Kitchen RANITIDINE HCL 150 MG PO TABS Oral Take 150 mg by mouth 2 (two) times daily.       BP 123/63  Pulse 77  Temp(Src) 98.8 F (37.1 C) (Oral)  Resp 25  SpO2 98%  Physical Exam 1055: Physical examination:  Nursing notes reviewed; Vital signs and O2 SAT reviewed;  Constitutional: Well developed, Well nourished, In no acute distress; Head:  Normocephalic, atraumatic; Eyes: EOMI, PERRL, No scleral icterus; ENMT: Mouth and pharynx normal, Mucous membranes dry; Neck: Supple, Full range of motion, No lymphadenopathy; Cardiovascular: Regular rate and rhythm, No gallop; Respiratory: Breath sounds coarse &  equal bilaterally, No wheezes, Shallow respiratory effort/excursion; Chest: Nontender, Movement normal; Abdomen: Soft, Nontender, Nondistended, Normal bowel sounds; Extremities: Pulses normal, No tenderness, No edema, No calf edema or asymmetry.; Neuro: AA&Ox3, Major CN grossly intact. No facial droop, speech clear.  No gross focal motor or sensory deficits in extremities.; Skin: Color pale, Warm, Dry, no rash.   ED Course  Procedures    MDM  MDM Reviewed: nursing note and vitals Reviewed previous: ECG Interpretation: ECG, labs and x-ray Total time providing critical care: 30-74 minutes. This excludes time spent performing separately reportable procedures and services. Consults: admitting MD   CRITICAL CARE Performed by: Laray Anger Total critical care time: 45 Critical care time was exclusive of separately billable procedures and treating other patients. Critical care was necessary to treat or  prevent imminent or life-threatening deterioration. Critical care was time spent personally by me on the following activities: development of treatment plan with patient and/or surrogate as well as nursing, discussions with consultants, evaluation of patient's response to treatment, examination of patient, obtaining history from patient or surrogate, ordering and performing treatments and interventions, ordering and review of laboratory studies, ordering and review of radiographic studies, pulse oximetry and re-evaluation of patient's condition.    Date: 01/08/2011  Rate: 81  Rhythm: normal sinus rhythm and premature ventricular contractions (PVC)  QRS Axis: normal  Intervals: normal  ST/T Wave abnormalities: normal  Conduction Disutrbances:none  Narrative Interpretation:   Old EKG Reviewed: unchanged, no significant changes from previous EKG dated 12/01/2003.  Results for orders placed during the hospital encounter of 01/08/11  CBC      Component Value Range   WBC 8.0  4.0 - 10.5 (K/uL)   RBC 3.78 (*) 3.87 - 5.11 (MIL/uL)   Hemoglobin 11.7 (*) 12.0 - 15.0 (g/dL)   HCT 40.9 (*) 81.1 - 46.0 (%)   MCV 94.2  78.0 - 100.0 (fL)   MCH 31.0  26.0 - 34.0 (pg)   MCHC 32.9  30.0 - 36.0 (g/dL)   RDW 91.4  78.2 - 95.6 (%)   Platelets 249  150 - 400 (K/uL)  DIFFERENTIAL      Component Value Range   Neutrophils Relative 83 (*) 43 - 77 (%)   Neutro Abs 6.7  1.7 - 7.7 (K/uL)   Lymphocytes Relative 10 (*) 12 - 46 (%)   Lymphs Abs 0.8  0.7 - 4.0 (K/uL)   Monocytes Relative 5  3 - 12 (%)   Monocytes Absolute 0.4  0.1 - 1.0 (K/uL)   Eosinophils Relative 2  0 - 5 (%)   Eosinophils Absolute 0.2  0.0 - 0.7 (K/uL)   Basophils Relative 1  0 - 1 (%)   Basophils Absolute 0.1  0.0 - 0.1 (K/uL)  PRO B NATRIURETIC PEPTIDE      Component Value Range   BNP, POC 5870.0 (*) 0 - 450 (pg/mL)  URINALYSIS, ROUTINE W REFLEX MICROSCOPIC      Component Value Range   Color, Urine YELLOW  YELLOW    Appearance CLEAR   CLEAR    Specific Gravity, Urine 1.023  1.005 - 1.030    pH 5.5  5.0 - 8.0    Glucose, UA NEGATIVE  NEGATIVE (mg/dL)   Hgb urine dipstick NEGATIVE  NEGATIVE    Bilirubin Urine NEGATIVE  NEGATIVE    Ketones, ur NEGATIVE  NEGATIVE (mg/dL)   Protein, ur NEGATIVE  NEGATIVE (mg/dL)   Urobilinogen, UA 1.0  0.0 - 1.0 (mg/dL)   Nitrite NEGATIVE  NEGATIVE    Leukocytes, UA NEGATIVE  NEGATIVE   COMPREHENSIVE METABOLIC PANEL      Component Value Range   Sodium 131 (*) 135 - 145 (mEq/L)   Potassium 4.3  3.5 - 5.1 (mEq/L)   Chloride 92 (*) 96 - 112 (mEq/L)   CO2 29  19 - 32 (mEq/L)   Glucose, Bld 155 (*) 70 - 99 (mg/dL)   BUN 34 (*) 6 - 23 (mg/dL)   Creatinine, Ser 1.61 (*) 0.50 - 1.10 (mg/dL)   Calcium 9.6  8.4 - 09.6 (mg/dL)   Total Protein 7.2  6.0 - 8.3 (g/dL)   Albumin 3.0 (*) 3.5 - 5.2 (g/dL)   AST 32  0 - 37 (U/L)   ALT 52 (*) 0 - 35 (U/L)   Alkaline Phosphatase 119 (*) 39 - 117 (U/L)   Total Bilirubin 0.4  0.3 - 1.2 (mg/dL)   GFR calc non Af Amer 38 (*) >90 (mL/min)   GFR calc Af Amer 44 (*) >90 (mL/min)  LIPASE, BLOOD      Component Value Range   Lipase 8 (*) 11 - 59 (U/L)  POCT I-STAT TROPONIN I      Component Value Range   Troponin i, poc 0.02  0.00 - 0.08 (ng/mL)   Comment 3           OCCULT BLOOD, POC DEVICE      Component Value Range   Fecal Occult Bld NEGATIVE     Dg Chest Port 1 View  01/08/2011  *RADIOLOGY REPORT*  Clinical Data: Shortness of breath, anxiety, nausea  PORTABLE CHEST - 1 VIEW  Comparison: Chest x-ray of 09/27/2007  Findings: The lungs are poorly aerated with airspace disease bilaterally.  There appears to be a right pleural effusion present as well.  These findings most likely represent pneumonia although edema cannot be excluded.  The heart is mildly enlarged.  No bony abnormality is seen.  IMPRESSION: Bilateral airspace disease with right effusion.  Consider pneumonia versus edema.  Original Report Authenticated By: Juline Patch, M.D.    12:36 PM:   Improved on Bipap, Sats 96-100%.  VS otherwise remain stable.  No change after IV narcan.  Appears CHF vs pneumonia on CXR, will tx IV avelox as well as IV lasix and ntg paste.  Concern re: elevated Cr/decreased GFR and giving IV contrast to r/o PE as additional cause for hypoxia.  Dx testing d/w pt and family.  Questions answered.  Verb understanding, agreeable to admit.   1:30 PM:  Improving Sats and appearing more comfortable while on bipap.  T/C to [facility] Dr. Mahala Menghini, case discussed, including:  HPI, pertinent PM/SHx, VS/PE, dx testing, ED course and treatment.  Agreeable to admit.  Requests ABG and procalcitonin/lactic acid added to labs and call him back with results.     4:16 PM:  T/C back to Dr. Mahala Menghini, informed of new labs results, states he will be down now to eval pt; requests to start to wean off bipap.  ED RN aware and will call Resp for assist.  Pt remains with stable VS, afebrile. Pt comfortable on bipap, Sats 96-100%, resps without distress, pt writing on paper "talking" with her family at bedside.  Pt and family updated re: plan of care.      Nnaemeka Samson Allison Quarry, DO 01/09/11 2152

## 2011-01-08 NOTE — ED Notes (Signed)
Patient voiced understanding of reasons of medications were given. Resp in to adjust mask.

## 2011-01-08 NOTE — Progress Notes (Signed)
ANTIBIOTIC CONSULT NOTE - INITIAL  Pharmacy Consult for  Vancomycin Indication: R/O pneumonia  Allergies  Allergen Reactions  . Celebrex (Celecoxib) Other (See Comments)    Reaction=anemia/leukopenia  . Diovan (Valsartan) Other (See Comments)    Reaction=increase potassium/acute renal failure    Patient Measurements: Height: 4\' 11"  (149.9 cm) Weight: 136 lb (61.689 kg) IBW/kg (Calculated) : 43.2  Adjusted Body Weight:   Vital Signs: Temp: 99.4 F (37.4 C) (11/15 1700) Temp src: Oral (11/15 1028) BP: 143/72 mmHg (11/15 1700) Pulse Rate: 94  (11/15 1700) Intake/Output from previous day:   Intake/Output from this shift: Total I/O In: -  Out: 350 [Urine:350]  Labs:  [facility] 01/08/11 1100  WBC 8.0  HGB 11.7*  PLT 249  LABCREA --  CREATININE 1.25*   Estimated Creatinine Clearance: 26.8 ml/min (by C-G formula based on Cr of 1.25). Normalized Creatinine 38 ml/min  Microbiology: Urine cx ordered.  Medical History: Past Medical History  Diagnosis Date  . Diverticulitis     chronic issues with consitpation and diarrhea  . Hypertension     well controlled usually  . Hypothyroid   . Anemia   . Hypercholesterolemia   . Hypothyroidism   . Osteoarthritis     chronic-on multiple pain meds.  . Paroxysmal atrial fibrillation     not on coumadin-Cards=Tilley-saw him ?1 yr ago  . CHF (congestive heart failure), NYHA class II     has sudden onset decom CHF? 2/2 to PNA    Medications:   No current inpt meds  Home meds include  Aspirin, atorvastatin,hctz,metoprolol.  Celebrex, vicodin  Levothyroxine  Raniditine,Miralax,Zofran   Assessment: Hx of CHF, CXray: bilateral airspace disease, R effusion. Pulm edema vs pna.  Goal of Therapy:  Vancomycin trough 15-20 Follow culture results, renal function  Plan: Vancomycin 1000 mg IV q24hr Trough at steady state.   Chilton Si, Tallen Schnorr L 01/08/2011,5:40 PM

## 2011-01-08 NOTE — ED Notes (Signed)
Admitting md at bedside. Respiratory at bedside to wean pt off bipap

## 2011-01-08 NOTE — ED Notes (Signed)
RT at bedside and refused bipap at this time. Will continue to monitor.

## 2011-01-08 NOTE — ED Notes (Signed)
Respiratory called for abg and to re-place pt back on bipap per hospitalist md at bedside.

## 2011-01-09 ENCOUNTER — Encounter (HOSPITAL_COMMUNITY): Payer: Self-pay | Admitting: Cardiology

## 2011-01-09 ENCOUNTER — Inpatient Hospital Stay (HOSPITAL_COMMUNITY): Payer: 59

## 2011-01-09 DIAGNOSIS — I5031 Acute diastolic (congestive) heart failure: Secondary | ICD-10-CM | POA: Diagnosis present

## 2011-01-09 DIAGNOSIS — I369 Nonrheumatic tricuspid valve disorder, unspecified: Secondary | ICD-10-CM

## 2011-01-09 LAB — LEGIONELLA ANTIGEN, URINE

## 2011-01-09 LAB — BLOOD GAS, ARTERIAL
Bicarbonate: 28.3 mEq/L — ABNORMAL HIGH (ref 20.0–24.0)
Bicarbonate: 31.1 mEq/L — ABNORMAL HIGH (ref 20.0–24.0)
O2 Saturation: 96.5 %
Patient temperature: 98.6
TCO2: 25.7 mmol/L (ref 0–100)
TCO2: 27.8 mmol/L (ref 0–100)
pCO2 arterial: 42 mmHg (ref 35.0–45.0)
pH, Arterial: 7.482 — ABNORMAL HIGH (ref 7.350–7.400)
pO2, Arterial: 65.4 mmHg — ABNORMAL LOW (ref 80.0–100.0)

## 2011-01-09 LAB — CBC
HCT: 33.2 % — ABNORMAL LOW (ref 36.0–46.0)
MCHC: 34 g/dL (ref 30.0–36.0)
RDW: 13 % (ref 11.5–15.5)

## 2011-01-09 LAB — PRO B NATRIURETIC PEPTIDE: Pro B Natriuretic peptide (BNP): 3256 pg/mL — ABNORMAL HIGH (ref 0–450)

## 2011-01-09 MED ORDER — DEXTROSE 5 % IV SOLN
1.0000 g | INTRAVENOUS | Status: DC
  Administered 2011-01-09 – 2011-01-10 (×2): 1 g via INTRAVENOUS
  Filled 2011-01-09 (×3): qty 10

## 2011-01-09 MED ORDER — HYDROCODONE-ACETAMINOPHEN 5-500 MG PO TABS: 1.0000 | ORAL_TABLET | Freq: Four times a day (QID) | ORAL | Status: DC | PRN

## 2011-01-09 MED ORDER — VANCOMYCIN HCL IN DEXTROSE 1-5 GM/200ML-% IV SOLN
1000.0000 mg | INTRAVENOUS | Status: DC
  Administered 2011-01-09 – 2011-01-10 (×2): 1000 mg via INTRAVENOUS
  Filled 2011-01-09 (×2): qty 200

## 2011-01-09 MED ORDER — GUAIFENESIN ER 600 MG PO TB12
1200.0000 mg | ORAL_TABLET | Freq: Two times a day (BID) | ORAL | Status: DC
  Administered 2011-01-09 – 2011-01-10 (×4): 1200 mg via ORAL
  Filled 2011-01-09 (×6): qty 2

## 2011-01-09 MED ORDER — FUROSEMIDE 10 MG/ML IJ SOLN
40.0000 mg | Freq: Two times a day (BID) | INTRAMUSCULAR | Status: DC
  Administered 2011-01-09: 40 mg via INTRAVENOUS
  Filled 2011-01-09 (×3): qty 4

## 2011-01-09 MED ORDER — HYDROCODONE-ACETAMINOPHEN 5-325 MG PO TABS
1.0000 | ORAL_TABLET | Freq: Four times a day (QID) | ORAL | Status: DC | PRN
  Administered 2011-01-10 – 2011-01-15 (×14): 1 via ORAL
  Filled 2011-01-09 (×14): qty 1

## 2011-01-09 MED ORDER — HYDROMORPHONE HCL PF 2 MG/ML IJ SOLN
2.0000 mg | INTRAMUSCULAR | Status: DC | PRN
  Administered 2011-01-09 (×3): 2 mg via INTRAVENOUS
  Filled 2011-01-09 (×3): qty 1

## 2011-01-09 MED ORDER — FUROSEMIDE 40 MG PO TABS
40.0000 mg | ORAL_TABLET | Freq: Two times a day (BID) | ORAL | Status: DC
  Administered 2011-01-09: 40 mg via ORAL
  Filled 2011-01-09 (×2): qty 1

## 2011-01-09 MED ORDER — DEXTROSE 5 % IV SOLN
500.0000 mg | INTRAVENOUS | Status: DC
  Administered 2011-01-09: 500 mg via INTRAVENOUS
  Filled 2011-01-09 (×2): qty 500

## 2011-01-09 MED ORDER — METOPROLOL TARTRATE 50 MG PO TABS
50.0000 mg | ORAL_TABLET | Freq: Two times a day (BID) | ORAL | Status: DC
  Administered 2011-01-09 – 2011-01-15 (×11): 50 mg via ORAL
  Filled 2011-01-09 (×15): qty 1

## 2011-01-09 NOTE — Progress Notes (Addendum)
Reason for Consult: Acute respiratory failure Referring Physician: TRH Mahala [name])  Desiree [name] is an 77 y.o. female.  HPI: Much of the history is provided by the patient's daughter. Desiree [name] dates her mother's onset of illness to 11/4 when the patient's brother was buried. Desiree [name] has reportedly suffered from general malaise since that time. Desiree [name] has also not reliably complied with her usual medical regimen - in particular, Miralax. Desiree [name] has consequently develop constipation and nausea (no vomiting). On the day prior to admission, the patient's daughter noted that Desiree [name] seemed SOB with minimal exertion. This progressively worsened and Desiree [name] was brought to ED for further eval where Desiree [name] was found to be dyspneic and hypoxic. Desiree [name] was transiently placed on NPPV. At the time of my evaluation, Desiree [name] was comfortable on VM @ 40%. Desiree [name] denies purulent sputum, hemoptysis, CP, LE edema presently but notes that Desiree [name] has had some increased ankle edema recently. Sh also admits to orthopnea but denies PND.   Past Medical History  Diagnosis Date  . Diverticulitis - chronic constipation       . Hypertension       . Hypothyroid   . Anemia   . Hypercholesterolemia   . Hypothyroidism   . Osteoarthritis with chronic pain syndrome       . Paroxysmal atrial fibrillation Desiree [name]) - not on warfarin       . CHF (congestive heart failure), NYHA class II             "Heart Murmur"  Past Surgical History  Procedure Date  . Carotid endarterectomy   . Carpal tunnel release   . Rotator cuff repair     2 on right, 1 on left  . Abdominal hysterectomy   . Cataract extraction   . Knee surgery     bilateral    History reviewed. No pertinent family history.  Social History:  reports that Desiree [name] has never smoked. Desiree [name] has never used smokeless tobacco. Desiree [name] reports that Desiree [name] does not drink alcohol or use illicit drugs.   Results for orders placed during the hospital encounter of 01/08/11 (from the past 48 hour(s))  CBC     Status:  Abnormal   Collection Time   01/08/11 11:00 AM      Component Value Range Comment   WBC 8.0  4.0 - 10.5 (K/uL)    RBC 3.78 (*) 3.87 - 5.11 (MIL/uL)    Hemoglobin 11.7 (*) 12.0 - 15.0 (g/dL)    HCT 16.1 (*) 09.6 - 46.0 (%)    MCV 94.2  78.0 - 100.0 (fL)    MCH 31.0  26.0 - 34.0 (pg)    MCHC 32.9  30.0 - 36.0 (g/dL)    RDW 04.5  40.9 - 81.1 (%)    Platelets 249  150 - 400 (K/uL)   DIFFERENTIAL     Status: Abnormal   Collection Time   01/08/11 11:00 AM      Component Value Range Comment   Neutrophils Relative 83 (*) 43 - 77 (%)    Neutro Abs 6.7  1.7 - 7.7 (K/uL)    Lymphocytes Relative 10 (*) 12 - 46 (%)    Lymphs Abs 0.8  0.7 - 4.0 (K/uL)    Monocytes Relative 5  3 - 12 (%)    Monocytes Absolute 0.4  0.1 - 1.0 (K/uL)    Eosinophils Relative 2  0 - 5 (%)    Eosinophils Absolute 0.2  0.0 - 0.7 (K/uL)    Basophils  Relative 1  0 - 1 (%)    Basophils Absolute 0.1  0.0 - 0.1 (K/uL)   COMPREHENSIVE METABOLIC PANEL     Status: Abnormal   Collection Time   01/08/11 11:00 AM      Component Value Range Comment   Sodium 131 (*) 135 - 145 (mEq/L)    Potassium 4.3  3.5 - 5.1 (mEq/L)    Chloride 92 (*) 96 - 112 (mEq/L)    CO2 29  19 - 32 (mEq/L)    Glucose, Bld 155 (*) 70 - 99 (mg/dL)    BUN 34 (*) 6 - 23 (mg/dL)    Creatinine, Ser 1.61 (*) 0.50 - 1.10 (mg/dL)    Calcium 9.6  8.4 - 10.5 (mg/dL)    Total Protein 7.2  6.0 - 8.3 (g/dL)    Albumin 3.0 (*) 3.5 - 5.2 (g/dL)    AST 32  0 - 37 (U/L)    ALT 52 (*) 0 - 35 (U/L)    Alkaline Phosphatase 119 (*) 39 - 117 (U/L)    Total Bilirubin 0.4  0.3 - 1.2 (mg/dL)    GFR calc non Af Amer 38 (*) >90 (mL/min)    GFR calc Af Amer 44 (*) >90 (mL/min)   LIPASE, BLOOD     Status: Abnormal   Collection Time   01/08/11 11:00 AM      Component Value Range Comment   Lipase 8 (*) 11 - 59 (U/L)   PROCALCITONIN     Status: Normal   Collection Time   01/08/11 11:00 AM      Component Value Range Comment   Procalcitonin <0.10     PRO B NATRIURETIC  PEPTIDE     Status: Abnormal   Collection Time   01/08/11 11:02 AM      Component Value Range Comment   BNP, POC 5870.0 (*) 0 - 450 (pg/mL)   POCT I-STAT TROPONIN I     Status: Normal   Collection Time   01/08/11 11:14 AM      Component Value Range Comment   Troponin i, poc 0.02  0.00 - 0.08 (ng/mL)    Comment 3            URINALYSIS, ROUTINE W REFLEX MICROSCOPIC     Status: Normal   Collection Time   01/08/11 11:49 AM      Component Value Range Comment   Color, Urine YELLOW  YELLOW     Appearance CLEAR  CLEAR     Specific Gravity, Urine 1.023  1.005 - 1.030     pH 5.5  5.0 - 8.0     Glucose, UA NEGATIVE  NEGATIVE (mg/dL)    Hgb urine dipstick NEGATIVE  NEGATIVE     Bilirubin Urine NEGATIVE  NEGATIVE     Ketones, ur NEGATIVE  NEGATIVE (mg/dL)    Protein, ur NEGATIVE  NEGATIVE (mg/dL)    Urobilinogen, UA 1.0  0.0 - 1.0 (mg/dL)    Nitrite NEGATIVE  NEGATIVE     Leukocytes, UA NEGATIVE  NEGATIVE  MICROSCOPIC NOT DONE ON URINES WITH NEGATIVE PROTEIN, BLOOD, LEUKOCYTES, NITRITE, OR GLUCOSE <1000 mg/dL.  OCCULT BLOOD, POC DEVICE     Status: Normal   Collection Time   01/08/11 11:58 AM      Component Value Range Comment   Fecal Occult Bld NEGATIVE     LACTIC ACID, PLASMA     Status: Normal   Collection Time   01/08/11  1:30 PM  Component Value Range Comment   Lactic Acid, Venous 2.0  0.5 - 2.2 (mmol/L)   BLOOD GAS, ARTERIAL     Status: Abnormal   Collection Time   01/08/11  2:15 PM      Component Value Range Comment   FIO2 1.00      Delivery systems VENTILATOR      Mode BILEVEL POSITIVE AIRWAY PRESSURE      Inspiratory PAP 10      Expiratory PAP 5.0      pH, Arterial 7.465 (*) 7.350 - 7.400     pCO2 arterial 39.5  35.0 - 45.0 (mmHg)    pO2, Arterial 85.0  80.0 - 100.0 (mmHg)    Bicarbonate 28.0 (*) 20.0 - 24.0 (mEq/L)    TCO2 25.3  0 - 100 (mmol/L)    Acid-Base Excess 4.4 (*) 0.0 - 2.0 (mmol/L)    O2 Saturation 96.6      Patient temperature 98.6      Collection site  RIGHT RADIAL      Drawn by [id number]      Sample type ARTERIAL      Allens test (pass/fail) PASS  PASS    CARDIAC PANEL(CRET KIN+CKTOT+MB+TROPI)     Status: Normal   Collection Time   01/08/11  5:18 PM      Component Value Range Comment   Total CK 60  7 - 177 (U/L)    CK, MB 3.7  0.3 - 4.0 (ng/mL)    Troponin I <0.30  <0.30 (ng/mL)    Relative Index RELATIVE INDEX IS INVALID  0.0 - 2.5    BLOOD GAS, ARTERIAL     Status: Abnormal   Collection Time   01/08/11  5:23 PM      Component Value Range Comment   FIO2 .50      Delivery systems OXYGEN MASK      pH, Arterial 7.454 (*) 7.350 - 7.400     pCO2 arterial 39.2  35.0 - 45.0 (mmHg)    pO2, Arterial 51.0 (*) 80.0 - 100.0 (mmHg)    Bicarbonate 27.1 (*) 20.0 - 24.0 (mEq/L)    TCO2 24.6  0 - 100 (mmol/L)    Acid-Base Excess 3.4 (*) 0.0 - 2.0 (mmol/L)    O2 Saturation 83.5      Patient temperature 98.6      Collection site RIGHT RADIAL      Drawn by [id number]      Sample type ARTERIAL      Allens test (pass/fail) PASS  PASS    LEGIONELLA ANTIGEN, URINE     Status: Normal   Collection Time   01/09/11 12:04 AM      Component Value Range Comment   Specimen Description URINE, CATHETERIZED      Special Requests NONE      Legionella Antigen, Urine Negative for Legionella pneumophilia serogroup 1      Report Status 01/09/2011 FINAL     STREP PNEUMONIAE URINARY ANTIGEN     Status: Normal   Collection Time   01/09/11 12:04 AM      Component Value Range Comment   Strep Pneumo Urinary Antigen NEGATIVE  NEGATIVE    MRSA PCR SCREENING     Status: Normal   Collection Time   01/09/11  1:39 AM      Component Value Range Comment   MRSA by PCR NEGATIVE  NEGATIVE    CBC     Status: Abnormal   Collection Time   01/09/11  3:33  AM      Component Value Range Comment   WBC 7.3  4.0 - 10.5 (K/uL)    RBC 3.67 (*) 3.87 - 5.11 (MIL/uL)    Hemoglobin 11.3 (*) 12.0 - 15.0 (g/dL)    HCT 40.9 (*) 81.1 - 46.0 (%)    MCV 90.5  78.0 - 100.0 (fL)    MCH 30.8   26.0 - 34.0 (pg)    MCHC 34.0  30.0 - 36.0 (g/dL)    RDW 91.4  78.2 - 95.6 (%)    Platelets 214  150 - 400 (K/uL)   PROCALCITONIN     Status: Normal   Collection Time   01/09/11  3:34 AM      Component Value Range Comment   Procalcitonin <0.10     BLOOD GAS, ARTERIAL     Status: Abnormal   Collection Time   01/09/11  4:28 AM      Component Value Range Comment   FIO2 1.00      Delivery systems NON-REBREATHER OXYGEN MASK      pH, Arterial 7.431 (*) 7.350 - 7.400     pCO2 arterial 43.3  35.0 - 45.0 (mmHg)    pO2, Arterial 85.8  80.0 - 100.0 (mmHg)    Bicarbonate 28.3 (*) 20.0 - 24.0 (mEq/L)    TCO2 25.7  0 - 100 (mmol/L)    Acid-Base Excess 4.0 (*) 0.0 - 2.0 (mmol/L)    O2 Saturation 96.5      Patient temperature 98.6      Collection site LEFT RADIAL      Drawn by [id number]      Sample type ARTERIAL DRAW      Allens test (pass/fail) PASS  PASS      Dg Chest Right Decubitus  01/08/2011  *RADIOLOGY REPORT*  Clinical Data: Shortness of breath and weakness  CHEST - RIGHT DECUBITUS  Comparison: 01/08/2011 chest radiograph  Findings: Detail is obscured by the patient's arms.  Bilateral free flowing moderate sized pleural effusions are again noted.  The right lung is obscured by overlying soft tissue.  The visualized left lung field is clear but the costophrenic angle is omitted from the field of view.  IMPRESSION: Bilateral moderate free flowing pleural effusions.  Original Report Authenticated By: Harrel Lemon, M.D.   Dg Chest Port 1 View  01/08/2011  *RADIOLOGY REPORT*  Clinical Data: Shortness of breath, anxiety, nausea  PORTABLE CHEST - 1 VIEW  Comparison: Chest x-ray of 09/27/2007  Findings: The lungs are poorly aerated with airspace disease bilaterally.  There appears to be a right pleural effusion present as well.  These findings most likely represent pneumonia although edema cannot be excluded.  The heart is mildly enlarged.  No bony abnormality is seen.  IMPRESSION: Bilateral  airspace disease with right effusion.  Consider pneumonia versus edema.  Original Report Authenticated By: Juline Patch, M.D.    Blood pressure 117/41, pulse 90, temperature 99.9 F (37.7 C), temperature source Core (Comment), resp. rate 20, height 4\' 11"  (1.499 m), weight 131 lb 2.8 oz (59.5 kg), SpO2 92.00%.  Physical Exam  Constitutional: Awake and follows commands. Currently on 60% NRB  HENT:  Head: Normocephalic and atraumatic.  Eyes: Conjunctivae are normal. Pupils are equal, round, and reactive to light. Left eye exhibits no discharge. No scleral icterus.  Neck: Normal range of motion. Neck supple. No JVD present. No tracheal deviation present. No thyromegaly present.  Cardiovascular: Normal rate and regular rhythm.   Murmur (holosystolic 306  murmur right and left upper sternal edge with radiation to patent) heard.      3/6 holosyst M  Respiratory:       Dull to perc 1/3 up on R and 1/4 up on L. Diminished BS in L base. Bronchial BS and egophony in R base  GI: Soft. Bowel sounds are normal. Desiree [name] exhibits no distension. There is no tenderness. There is no rebound and no guarding.  Musculoskeletal: Normal range of motion. Desiree [name] exhibits no edema and no tenderness.  Lymphadenopathy:    Desiree [name] has no cervical adenopathy.  Neurological: Desiree [name] is alert and oriented to person, place, and time. Desiree [name] has normal reflexes. Desiree [name] displays normal reflexes. No cranial nerve deficit. Desiree [name] exhibits normal muscle tone. Coordination normal.  Skin: Skin is warm and dry. No rash noted. No erythema.  Psychiatric: Desiree [name] has a normal mood and affect. Her behavior is normal. Judgment and thought content normal.    Assessment/Plan:  1. Hypoxic resp failure in setting of presumed pneumonia and CHF with BNP >5000. Bilateral r>l free flowing effusions. A. O2 as needed B. Diuresis as tolerated C. May need/benefit from thoracentesis D. BD's E. May need OTT/MVS. For now will attempt to support with prn BiPAP while we  diurese  2. Presumed CAP 1. Cont abx as ordered 2. Pan culture 3. Pulmonary edema A. Diuresis as tolerated  MINOR, Chrissie Noa S  Attending Addendum:  I have seen the patient, discussed the issues, test results and plans with S. Minor. I agree with the Assessment and Plans as outlined above.  Emanuele Mcwhirter S. 01/09/2011 3:53 PM

## 2011-01-09 NOTE — Progress Notes (Signed)
2D Echo Completed.  Electa Sterry Johnson, Sonographer 

## 2011-01-09 NOTE — Consult Note (Signed)
Spoke with Dr. Jordan Hawks about Vancomycin per pharmacy being d/c'd.  Unclear why this was, but he wanted to give the patient vancomycin for PNA.  Will continue with prior dosing plan.  Also spoke with RN, was not verbally reported vancomycin given, in addition to not charted in Epic.

## 2011-01-09 NOTE — Consults (Signed)
Admit date: 01/08/2011 Referring Physician Dr. Pleas Koch Primary Cardiologist  Dr. Viann Fish Reason for Consultation CHF  HPI:  This is an 77yo WF with history of CHF, PAF and HTN who was admitted with acute respiratory failure.  Apparently around 11/4 she started having generalized malaise after the her brother was buried.  She has been noncompliant wih her medical therapy.  THe day prior to admission she developed SOB with minimal exertion and this progressively worsened and she went to the ER where she was hypooxic and placed on NPPV. SHe denied any cough, chest pain, LE edema,palpitations.  On evaluation she was found to be in acute CHF as well as pneumonia.  She was started on IV Lasix.  We are now asked to consult for further treatment of CHF.  She says that she is feeling better since yesterday.  After asking her again she says that she may have had some chest pain over the past few weeks but nothing that made her think something was wrong.  Her Cardiac enzymes are normal.     PMH:   Past Medical History  Diagnosis Date  . Diverticulitis     chronic issues with consitpation and diarrhea  . Hypertension     well controlled usually  . Hypothyroid   . Anemia   . Hypercholesterolemia   . Hypothyroidism   . Osteoarthritis     chronic-on multiple pain meds.  . Paroxysmal atrial fibrillation     not on coumadin-Cards=Tilley-saw him ?1 yr ago  . CHF (congestive heart failure), NYHA class II     has sudden onset decom CHF? 2/2 to PNA     PSH:   Past Surgical History  Procedure Date  . Carotid endarterectomy   . Carpal tunnel release   . Rotator cuff repair     2 on right, 1 on left  . Abdominal hysterectomy   . Cataract extraction   . Knee surgery     bilateral    Allergies:  Celebrex and Diovan Prior to Admit Meds:   Prescriptions prior to admission  Medication Sig Dispense Refill  . aspirin 81 MG tablet Take 81 mg by mouth daily.       Marland Kitchen atorvastatin (LIPITOR) 80 MG  tablet Take 80 mg by mouth at bedtime.       . beta carotene w/minerals (OCUVITE) tablet Take 1 tablet by mouth daily.        . hydrochlorothiazide (HYDRODIURIL) 25 MG tablet Take 25 mg by mouth daily.       Marland Kitchen HYDROcodone-acetaminophen (VICODIN) 5-500 MG per tablet Take 1 tablet by mouth every 6 (six) hours as needed. For pain       . levothyroxine (SYNTHROID, LEVOTHROID) 75 MCG tablet Take 75 mcg by mouth daily.       . metoprolol (LOPRESSOR) 100 MG tablet Take 100 mg by mouth 2 (two) times daily.       . ondansetron (ZOFRAN-ODT) 4 MG disintegrating tablet Take 4 mg by mouth every 8 (eight) hours as needed.       Marland Kitchen DISCONTD: celecoxib (CELEBREX) 200 MG capsule Take 200 mg by mouth daily.       Marland Kitchen DISCONTD: polyethylene glycol (MIRALAX / GLYCOLAX) packet Take 17 g by mouth daily.       Marland Kitchen DISCONTD: ranitidine (ZANTAC) 150 MG tablet Take 150 mg by mouth 2 (two) times daily.        Fam HX:   History reviewed. No pertinent family history. Social  HX:    History   Social History  . Marital Status: Widowed    Spouse Name: N/A    Number of Children: N/A  . Years of Education: N/A   Occupational History  . Not on file.   Social History Main Topics  . Smoking status: Never Smoker   . Smokeless tobacco: Never Used  . Alcohol Use: No  . Drug Use: No  . Sexually Active:    Other Topics Concern  . Not on file   Social History Narrative   Lives aloneWorks full timeStill drives and very active usually     ROS:  All 11 ROS were addressed and are negative except what is stated in the HPI  Physical Exam: Blood pressure 117/41, pulse 90, temperature 99.9 F (37.7 C), temperature source Core (Comment), resp. rate 20, height 4\' 11"  (1.499 m), weight 59.5 kg (131 lb 2.8 oz), SpO2 92.00%.    General: Well developed, well nourished, in no acute distress Head: Eyes PERRLA, No xanthomas.   Normal cephalic and atramatic  Lungs: Decreased breath sounds at right base approx 1/3 way up. Heart: HRRR S1  S2 Pulses are 2+ & equal.2/6 holosytolic murmur at LLSB to apex            No carotid bruit. No JVD.  No abdominal bruits. No femoral bruits. Abdomen: Bowel sounds are positive, abdomen soft and non-tender without masse Extremities: No clubbing, cyanosis or edema.  DP +1 Neuro: Alert and oriented X 3. Psych:  Good affect, responds appropriately    Labs:   Lab Results  Component Value Date   WBC 7.3 01/09/2011   HGB 11.3* 01/09/2011   HCT 33.2* 01/09/2011   MCV 90.5 01/09/2011   PLT 214 01/09/2011    Lab 01/08/11 1100  NA 131*  K 4.3  CL 92*  CO2 29  BUN 34*  CREATININE 1.25*  CALCIUM 9.6  PROT 7.2  BILITOT 0.4  ALKPHOS 119*  ALT 52*  AST 32  GLUCOSE 155*     Lab Results  Component Value Date   CKTOTAL 60 01/08/2011   CKMB 3.7 01/08/2011   TROPONINI <0.30 01/08/2011           Radiology:  Dg Chest Right Decubitus  01/08/2011  *RADIOLOGY REPORT*  Clinical Data: Shortness of breath and weakness  CHEST - RIGHT DECUBITUS  Comparison: 01/08/2011 chest radiograph  Findings: Detail is obscured by the patient's arms.  Bilateral free flowing moderate sized pleural effusions are again noted.  The right lung is obscured by overlying soft tissue.  The visualized left lung field is clear but the costophrenic angle is omitted from the field of view.  IMPRESSION: Bilateral moderate free flowing pleural effusions.  Original Report Authenticated By: Harrel Lemon, M.D.   Dg Chest Port 1 View  01/08/2011  *RADIOLOGY REPORT*  Clinical Data: Shortness of breath, anxiety, nausea  PORTABLE CHEST - 1 VIEW  Comparison: Chest x-ray of 09/27/2007  Findings: The lungs are poorly aerated with airspace disease bilaterally.  There appears to be a right pleural effusion present as well.  These findings most likely represent pneumonia although edema cannot be excluded.  The heart is mildly enlarged.  No bony abnormality is seen.  IMPRESSION: Bilateral airspace disease with right effusion.   Consider pneumonia versus edema.  Original Report Authenticated By: Juline Patch, M.D.    EKG: NSR with PVC's  ASSESSMENT: 1.  Acute diastolic heart failure possibly secondary to pneumonia. 2.  Pneumonia on  IV antiboitics. 3.  Acute Respiratory Failure secondary to # 1 and #2. 4.  PAF maintaining NSR 5.  Hypertension controlled  PLAN:  1.  Check results of echo to assess LVF 2.  Continue IV diuretics  TURNER,TRACI R  01/09/2011  11:52 AM

## 2011-01-09 NOTE — Progress Notes (Signed)
[facility] progress note   Interval h/o:- nice 77 yr old lady of Dr. York Spaniel and Blima Rich Alms who presented with Acute hypoxic Resp failure, initially requiring Bipap for a prolonged period of time in the ED-Seeemd to do better over the past 18 hours. Nursing reports p[atient hungry and wishes to drink liquids.  Patient otherwise has no c/o other than some mildm ;ower abd pain and feels somewhat weak and tired  Patient Details:    Desiree [name] is an 77 y.o. female.  Lines, Airways, Drains: Urethral Catheter Coude;Temperature probe 14 Fr. (Active)  Site Assessment Clean;Intact 01/09/2011  2:00 AM  Collection Container Standard drainage bag 01/09/2011  2:00 AM  Securement Method Securing device (Describe) 01/09/2011  2:00 AM  Indication for Insertion or Continuance of Catheter Urinary output monitoring 01/09/2011  2:00 AM  Output (mL) 450 mL 01/09/2011  6:00 AM    Anti-infectives:  Anti-infectives     Start     Dose/Rate Route Frequency Ordered Stop   01/09/11 0100   vancomycin (VANCOCIN) IVPB 1000 mg/200 mL premix        1,000 mg 200 mL/hr over 60 Minutes Intravenous Every 24 hours 01/09/11 0054     01/08/11 1830   vancomycin (VANCOCIN) IVPB 1000 mg/200 mL premix  Status:  Discontinued        1,000 mg 200 mL/hr over 60 Minutes Intravenous Every 24 hours 01/08/11 1759 01/08/11 1814   01/08/11 1815   cefTRIAXone (ROCEPHIN) 1 g in dextrose 5 % 50 mL IVPB        1 g 100 mL/hr over 30 Minutes Intravenous  Once 01/08/11 1814 01/08/11 1906   01/08/11 1230   moxifloxacin (AVELOX) IVPB 400 mg        400 mg 250 mL/hr over 60 Minutes Intravenous  Once 01/08/11 1227 01/08/11 1400          Microbiology: Results for orders placed during the hospital encounter of 01/08/11  MRSA PCR SCREENING     Status: Normal   Collection Time   01/09/11  1:39 AM      Component Value Range Status Comment   MRSA by PCR NEGATIVE  NEGATIVE  Final     Best Practice/Protocols:  VTE  Prophylaxis: Lovenox (prophylaxtic dose) Sepsis (date>>>On Rocephin and Moxiflox-Day # 2 therapy (VANCOMYCIBN WAS NOT STARTED AT MY REQUEST LAST PM))  Events:   Studies: Dg Chest Right Decubitus  01/08/2011  *RADIOLOGY REPORT*  Clinical Data: Shortness of breath and weakness  CHEST - RIGHT DECUBITUS  Comparison: 01/08/2011 chest radiograph  Findings: Detail is obscured by the patient's arms.  Bilateral free flowing moderate sized pleural effusions are again noted.  The right lung is obscured by overlying soft tissue.  The visualized left lung field is clear but the costophrenic angle is omitted from the field of view.  IMPRESSION: Bilateral moderate free flowing pleural effusions.  Original Report Authenticated By: Harrel Lemon, M.D.   Dg Chest Port 1 View  01/08/2011  *RADIOLOGY REPORT*  Clinical Data: Shortness of breath, anxiety, nausea  PORTABLE CHEST - 1 VIEW  Comparison: Chest x-ray of 09/27/2007  Findings: The lungs are poorly aerated with airspace disease bilaterally.  There appears to be a right pleural effusion present as well.  These findings most likely represent pneumonia although edema cannot be excluded.  The heart is mildly enlarged.  No bony abnormality is seen.  IMPRESSION: Bilateral airspace disease with right effusion.  Consider pneumonia versus edema.  Original Report Authenticated By:  Juline Patch, M.D.    Consults: Cards CCM     Subjective:    Overnight Issues:   Objective:  Vital signs for last 24 hours: Temp:  [97.4 F (36.3 C)-99.4 F (37.4 C)] 98.8 F (37.1 C) (11/16 0400) Pulse Rate:  [58-94] 78  (11/16 0400) Resp:  [16-36] 17  (11/16 0400) BP: (98-162)/(47-76) 112/50 mmHg (11/16 0400) SpO2:  [70 %-100 %] 93 % (11/16 0400) FiO2 (%):  [15 %-50 %] 15 % (11/15 1936) Weight:  [59.5 kg (131 lb 2.8 oz)-62.596 kg (138 lb)] 131 lb 2.8 oz (59.5 kg) (11/16 0200)  Hemodynamic parameters for last 24 hours:     Intake/Output from previous day: 11/15  0701 - 11/16 0700 In: 230 [I.V.:30; IV Piggyback:200] Out: 2375 [Urine:2375]  Intake/Output this shift:    Vent settings for last 24 hours: Vent Mode:  [-]  FiO2 (%):  [15 %-50 %] 15 %  Physical Exam:  General: alert and no respiratory distress Neuro: alert, oriented and nonfocal exam HEENT/Neck: JVD mild Resp: mild retractions, diminished breath sounds base - right, dullness to percussion base - right and egophony base - left CVS: regular rate and rhythm, S1, S2 normal, no murmur, click, rub or gallop and has PVC's on monitor and also some ectopy-NSR otherwise Extremities: no edema, no erythema, pulses WNL  Assessment/Plan:   NEURO  NAD-GCS 15 and will feed, oral meds   Plan: see above  PULM  Respiratory Acidosis (acute, suspected and due to possible parapneumonic process-Cont Abx therapy-Afebrile currrently-await blood cult x 2-trend lactate-add mucinex for expectoration and would culture if sputum +.  Will attempt wean of O2-suspect she might tolerate Nasal canula for a short amount of time, but RR at bedisde still mid 20's and has some retractions.  Will see clinical course in SDU today-Dr. byrum of CCM aware of patient-app any assitance if though necessary, in terms Pleural effusion tap.)    Plan: see above  CARDIO  Acute Pulmonary Edema (due to valvular disease), Congestive Heart Failure (unknown-Cardiolofgy Dr. Mayford Knife contacted-Echo being done now-Troponins non sugg of ioschemia-will review clinically) and Hypertension (controlled)    Plan: see above   RENAL  Respiratory Acidosis (acute and 2/2 to ?Intrinisc lung process) and Respiratory Alkalosis (compensatory-2/2 to Iatrogenic oxygenation-will wean as tolerated) Actue Renal Failure (etiology unknown) and Chronic Kidney Disease: Stage 3 (GFR 30-59)   Plan: will repeat labs and tredn Creat-Likely at 75 yrs of age, she has some element of geratirc kidneys  GI  Abdominal Pain / Tenderness (generalized) and Nausea   Plan:  likely 2/2 to her Divertilulits-D # 2 abx-Held Flagyl for now-review clinically in am  ID  Pneumonia (community-acquired but with double coverage as per above) Enteritis? Diverticulitis   Plan: Abx  HEME  nad   Plan: follow CBC for now  ENDO Hyponatremia (moderate (125 - 135 meq/dl))   Plan: likely 2/2 to aggressive diurses-cscale back Lasix Seems to have an element of R sided Hf with possibility of Shiock liver, but her Alk phos is normal for a lady of this age-Her ALT is elevated, which is a specific indicator of liver dysfunx--will trend her CMet over 24 hours and review am  Global Issues  See above    LOS: 1 day   Additional comments:I reviewed the patient's new clinical lab test results. CXR, I reviewed the patients new imaging test results. entirely, I have discussed and reviewed with family members patient's , Ms Charlene-whom I have updated. and I discussed  the patient's POC  with Dr. Delton Coombes, who will review the patient from a CCM standpoint.  Critical Care Total Time*: 45 Minutes  Cristofher Livecchi,JAI 01/09/2011  *Care during the described time interval was provided by me and/or other providers on the critical care team.  I have reviewed this patient's available data, including medical history, events of note, physical examination and test results as part of my evaluation.

## 2011-01-10 ENCOUNTER — Inpatient Hospital Stay (HOSPITAL_COMMUNITY): Payer: 59

## 2011-01-10 DIAGNOSIS — J189 Pneumonia, unspecified organism: Secondary | ICD-10-CM | POA: Diagnosis present

## 2011-01-10 DIAGNOSIS — E876 Hypokalemia: Secondary | ICD-10-CM

## 2011-01-10 DIAGNOSIS — I340 Nonrheumatic mitral (valve) insufficiency: Secondary | ICD-10-CM | POA: Diagnosis present

## 2011-01-10 DIAGNOSIS — I959 Hypotension, unspecified: Secondary | ICD-10-CM | POA: Diagnosis not present

## 2011-01-10 DIAGNOSIS — I509 Heart failure, unspecified: Secondary | ICD-10-CM

## 2011-01-10 DIAGNOSIS — D649 Anemia, unspecified: Secondary | ICD-10-CM

## 2011-01-10 LAB — URINE CULTURE

## 2011-01-10 LAB — COMPREHENSIVE METABOLIC PANEL
ALT: 48 U/L — ABNORMAL HIGH (ref 0–35)
AST: 29 U/L (ref 0–37)
Albumin: 2.7 g/dL — ABNORMAL LOW (ref 3.5–5.2)
CO2: 33 mEq/L — ABNORMAL HIGH (ref 19–32)
Calcium: 9 mg/dL (ref 8.4–10.5)
Creatinine, Ser: 0.96 mg/dL (ref 0.50–1.10)
Sodium: 134 mEq/L — ABNORMAL LOW (ref 135–145)
Total Protein: 6.3 g/dL (ref 6.0–8.3)

## 2011-01-10 LAB — CBC
HCT: 33.6 % — ABNORMAL LOW (ref 36.0–46.0)
MCV: 91.3 fL (ref 78.0–100.0)
RBC: 3.68 MIL/uL — ABNORMAL LOW (ref 3.87–5.11)
RDW: 13.1 % (ref 11.5–15.5)
WBC: 7.8 10*3/uL (ref 4.0–10.5)

## 2011-01-10 LAB — LACTIC ACID, PLASMA: Lactic Acid, Venous: 0.8 mmol/L (ref 0.5–2.2)

## 2011-01-10 LAB — FERRITIN: Ferritin: 111 ng/mL (ref 10–291)

## 2011-01-10 LAB — CARDIAC PANEL(CRET KIN+CKTOT+MB+TROPI)
CK, MB: 3.9 ng/mL (ref 0.3–4.0)
Relative Index: 3 — ABNORMAL HIGH (ref 0.0–2.5)
Total CK: 128 U/L (ref 7–177)

## 2011-01-10 LAB — PROCALCITONIN: Procalcitonin: <0.1 ng/mL

## 2011-01-10 LAB — IRON AND TIBC
Iron: 20 ug/dL — ABNORMAL LOW (ref 42–135)
UIBC: 214 ug/dL (ref 125–400)

## 2011-01-10 MED ORDER — POTASSIUM CHLORIDE 10 MEQ/100ML IV SOLN
10.0000 meq | INTRAVENOUS | Status: AC
  Administered 2011-01-10 (×4): 10 meq via INTRAVENOUS
  Filled 2011-01-10 (×3): qty 100

## 2011-01-10 MED ORDER — POTASSIUM CHLORIDE CRYS ER 20 MEQ PO TBCR: 40.0000 meq | EXTENDED_RELEASE_TABLET | Freq: Three times a day (TID) | ORAL | Status: DC

## 2011-01-10 MED ORDER — POTASSIUM CHLORIDE CRYS ER 20 MEQ PO TBCR
40.0000 meq | EXTENDED_RELEASE_TABLET | ORAL | Status: AC
  Administered 2011-01-10 (×2): 40 meq via ORAL
  Filled 2011-01-10 (×2): qty 2

## 2011-01-10 MED ORDER — ACETAMINOPHEN 325 MG PO TABS
ORAL_TABLET | ORAL | Status: AC
  Filled 2011-01-10: qty 2

## 2011-01-10 MED ORDER — POTASSIUM CHLORIDE 10 MEQ/100ML IV SOLN
INTRAVENOUS | Status: AC
  Filled 2011-01-10: qty 100

## 2011-01-10 MED ORDER — ACETAMINOPHEN 325 MG PO TABS
650.0000 mg | ORAL_TABLET | Freq: Four times a day (QID) | ORAL | Status: DC | PRN
  Administered 2011-01-10: 650 mg via ORAL

## 2011-01-10 MED ORDER — SPIRONOLACTONE 25 MG PO TABS
25.0000 mg | ORAL_TABLET | Freq: Every day | ORAL | Status: DC
  Administered 2011-01-10 – 2011-01-15 (×6): 25 mg via ORAL
  Filled 2011-01-10 (×6): qty 1

## 2011-01-10 MED ORDER — SODIUM CHLORIDE 0.9 % IV BOLUS (SEPSIS)
500.0000 mL | Freq: Once | INTRAVENOUS | Status: DC
Start: 2011-01-10 — End: 2011-01-10
  Administered 2011-01-10: 500 mL via INTRAVENOUS

## 2011-01-10 MED ORDER — FUROSEMIDE 10 MG/ML IJ SOLN
40.0000 mg | Freq: Every day | INTRAMUSCULAR | Status: DC
  Administered 2011-01-10: 40 mg via INTRAVENOUS
  Filled 2011-01-10: qty 4

## 2011-01-10 MED ORDER — POTASSIUM CHLORIDE CRYS ER 20 MEQ PO TBCR
40.0000 meq | EXTENDED_RELEASE_TABLET | Freq: Two times a day (BID) | ORAL | Status: DC
  Administered 2011-01-10 – 2011-01-11 (×3): 40 meq via ORAL
  Filled 2011-01-10 (×5): qty 2

## 2011-01-10 NOTE — Progress Notes (Signed)
Long discussion with family members Spoke with Dr.Wright who will review patient and likely assume care Patient to have no more than 2 visitors at a time, and Daughter confirms this request Will update family again later today.  Desiree [name],JAI

## 2011-01-10 NOTE — Progress Notes (Signed)
[facility] STEP-DOWN PROGRESS NOTE  Interval h/o:- nice 77 yr old lady of Dr. York Spaniel and Dr. Ivery Quale who presented with Acute hypoxic Resp failure, initially requiring Bipap for a prolonged period of time in the ED-Seeemd to do better over the past 36 hours.   She had an "exhausting" night per her admission and has also not been able to rest much.     Patient Details:    Desiree [name] is an 77 y.o. female. Main complaint seems to be her pain, which is likely multifactorial-her daughter yesterday was very concerned about this and mentioned this numerous times in my discussions with her. Feels overall somewhat better than she did eralier, but would like to get up and ambulate to "have a shower" Denies any specifc CP.  Still a little short of breath   Lines, Airways, Drains: Urethral Catheter Coude;Temperature probe 14 Fr. (Active)  Site Assessment Clean;Intact 01/09/2011  9:00 PM  Collection Container Standard drainage bag 01/09/2011  9:00 PM  Securement Method Leg strap 01/09/2011  9:00 PM  Indication for Insertion or Continuance of Catheter Urinary output monitoring 01/09/2011  2:00 AM  Output (mL) 150 mL 01/10/2011  1:00 AM    Anti-infectives:  Anti-infectives     Start     Dose/Rate Route Frequency Ordered Stop   01/09/11 1800   cefTRIAXone (ROCEPHIN) 1 g in dextrose 5 % 50 mL IVPB        1 g 100 mL/hr over 30 Minutes Intravenous Every 24 hours 01/09/11 0955     01/09/11 1000   azithromycin (ZITHROMAX) 500 mg in dextrose 5 % 250 mL IVPB        500 mg 250 mL/hr over 60 Minutes Intravenous Every 24 hours 01/09/11 0955     01/09/11 0100   vancomycin (VANCOCIN) IVPB 1000 mg/200 mL premix        1,000 mg 200 mL/hr over 60 Minutes Intravenous Every 24 hours 01/09/11 0054     01/08/11 1830   vancomycin (VANCOCIN) IVPB 1000 mg/200 mL premix  Status:  Discontinued        1,000 mg 200 mL/hr over 60 Minutes Intravenous Every 24 hours 01/08/11 1759 01/08/11 1814   01/08/11 1815   cefTRIAXone (ROCEPHIN) 1 g in dextrose 5 % 50 mL IVPB        1 g 100 mL/hr over 30 Minutes Intravenous  Once 01/08/11 1814 01/08/11 1906   01/08/11 1230   moxifloxacin (AVELOX) IVPB 400 mg        400 mg 250 mL/hr over 60 Minutes Intravenous  Once 01/08/11 1227 01/08/11 1400          Microbiology: Results for orders placed during the hospital encounter of 01/08/11  URINE CULTURE     Status: Normal   Collection Time   01/08/11 11:49 AM      Component Value Range Status Comment   Specimen Description URINE, CATHETERIZED   Final    Special Requests NONE   Final    Setup Time [id number]   Final    Colony Count NO GROWTH   Final    Culture NO GROWTH   Final    Report Status 01/10/2011 FINAL   Final   MRSA PCR SCREENING     Status: Normal   Collection Time   01/09/11  1:39 AM      Component Value Range Status Comment   MRSA by PCR NEGATIVE  NEGATIVE  Final     ABG  Component Value Date/Time   PHART 7.482* 01/09/2011 1600   PCO2ART 42.0 01/09/2011 1600   PO2ART 65.4* 01/09/2011 1600   HCO3 31.1* 01/09/2011 1600   TCO2 27.8 01/09/2011 1600   O2SAT 93.8 01/09/2011 1600   I/O last 3 completed shifts: In: 930 [P.O.:360; I.V.:270; IV Piggyback:300] Out: 2595 [Urine:2595]    Best Practice/Protocols:  VTE Prophylaxis: Mechanical Sepsis (date>>>11/14 till now)  Events:  Slightly hypotensive I/o shows good response to lasix cxr shows Right greater than left pleural effusions and basilar airspace  disease. The patient's pleural effusions appear slightly improved.    Studies: Dg Chest Right Decubitus  01/08/2011  *RADIOLOGY REPORT*  Clinical Data: Shortness of breath and weakness  CHEST - RIGHT DECUBITUS  Comparison: 01/08/2011 chest radiograph  Findings: Detail is obscured by the patient's arms.  Bilateral free flowing moderate sized pleural effusions are again noted.  The right lung is obscured by overlying soft tissue.  The visualized left lung  field is clear but the costophrenic angle is omitted from the field of view.  IMPRESSION: Bilateral moderate free flowing pleural effusions.  Original Report Authenticated By: Harrel Lemon, M.D.   Dg Chest Port 1 View  01/10/2011  *RADIOLOGY REPORT*  Clinical Data: Congestive heart failure.  PORTABLE CHEST - 1 VIEW  Comparison: Plain films of the chest 01/08/2011 and 01/09/2011.  Findings: Right greater than left pleural effusions and airspace disease persist.  The patient's pleural effusions appear slightly decreased since yesterday's examination.  Heart size is upper normal.  IMPRESSION: Right greater than left pleural effusions and basilar airspace disease.  The patient's pleural effusions appear slightly improved.  Original Report Authenticated By: Bernadene Bell. Maricela Curet, M.D.   Dg Chest Port 1 View  01/09/2011  *RADIOLOGY REPORT*  Clinical Data: Congestive heart failure, follow-up  PORTABLE CHEST - 1 VIEW  Comparison: Chest x-ray of 01/08/2011  Findings: The lungs remain poorly aerated.  There is little change in cardiomegaly, bilateral pleural effusions and airspace disease most consistent with edema.  Pneumonia cannot be excluded.  IMPRESSION: No change in poor aeration and probable edema with effusions. Cannot exclude pneumonia.  Original Report Authenticated By: Juline Patch, M.D.   Dg Chest Port 1 View  01/08/2011  *RADIOLOGY REPORT*  Clinical Data: Shortness of breath, anxiety, nausea  PORTABLE CHEST - 1 VIEW  Comparison: Chest x-ray of 09/27/2007  Findings: The lungs are poorly aerated with airspace disease bilaterally.  There appears to be a right pleural effusion present as well.  These findings most likely represent pneumonia although edema cannot be excluded.  The heart is mildly enlarged.  No bony abnormality is seen.  IMPRESSION: Bilateral airspace disease with right effusion.  Consider pneumonia versus edema.  Original Report Authenticated By: Juline Patch, M.D.     Consults: Treatment Team:  Darden Palmer., MD   Subjective:    Overnight Issues:   Objective:  Vital signs for last 24 hours: Temp:  [99 F (37.2 C)-100.4 F (38 C)] 99.9 F (37.7 C) (11/17 0400) Pulse Rate:  [72-94] 83  (11/17 0400) Resp:  [11-22] 21  (11/17 0400) BP: (93-130)/(41-62) 108/45 mmHg (11/17 0400) SpO2:  [90 %-97 %] 95 % (11/17 0400) FiO2 (%):  [50 %-100 %] 50 % (11/16 1600) Weight:  [59 kg (130 lb 1.1 oz)] 130 lb 1.1 oz (59 kg) (11/17 0000)  Hemodynamic parameters for last 24 hours:    Intake/Output from previous day: 11/16 0701 - 11/17 0700 In: 700 [P.O.:360; I.V.:240; IV  Piggyback:100] Out: 1795 [Urine:1795]  Intake/Output this shift:    Vent settings for last 24 hours: Vent Mode:  [-]  FiO2 (%):  [50 %-100 %] 50 %  Physical Exam:  General: alert, no respiratory distress and on nasal canula O2 HEENT/Neck: no JVD and PERRL Resp: diminished breath sounds base - left, dullness to percussion base - left and egophony LLL CVS: regular rate and rhythm, S1, S2 normal, no murmur, click, rub or gallop GI: soft, nontender, BS WNL, no r/g and tender Extremities: no edema, no erythema, pulses WNL NEURO  NAD-GCS 15 and will feed, oral meds    Plan: see above   PULM  Respiratory Acidosis (acute, suspected and due to possible parapneumonic process-Cont Abx therapy-triple CAP coverage d#2  -Afebrile currrently-await blood cult x 2-trend lactate-add mucinex for expectoration and would culture if sputum +.   Will attempt wean of O2-suspect she might tolerate Nasal canula for a short amount of time, but RR at bedisde still mid 20's and has some retractions     Plan: see above   CARDIO  Acute Pulmonary Edema (due to valvular disease), Congestive Heart Failure (unknown-Cardiolofgy Dr. Mayford Knife contacted-Echo still pending  mildy Hypotensive overnight-->requested to check blood pressures and call back from RN--Call back stated that the Blood pressurtes were  again soft with BP 100/58.    Will FORMALLY REQUEST PCCM TO ASSUME CARE She will need Central Venous pressures for sepsis related protocls and might require vasopressors if not fluid responive-I will give her a 500 cc NS bolus, but this seems counterproductive, as she is also being diuresed  ?Neosynephrine vs dopamine-per CCM discretion  Could this be 2/2 to IV dilaudid-?possible-I would need a central line to help me determine CVP...    Plan: see above   RENAL  Respiratory Acidosis (acute and 2/2 to ?Intrinisc lung process) and Respiratory Alkalosis (compensatory-2/2 to Iatrogenic oxygenation-will wean as tolerated)   Actue Renal Failure (etiology unknown) and Chronic Kidney Disease: Stage 3 (GFR 30-59)    Plan: will repeat labs and tredn Creat-Likely at 40 yrs of age, she has some element of geratirc kidneys   GI  Abdominal Pain / Tenderness (generalized) and Nausea    Plan: likely 2/2 to her Divertilulits-D # 3 abx-Held Flagyl for now Non focal abd exam this am Would hold imaging at present time If dark or tarry stool, low threshold to get CT angio abd   ID  Pneumonia (community-acquired but with double coverage as per above)  Enteritis? Diverticulitis   See above discussion re: relative hypotension Will cycle lactate, procalcitonin again and give 500 cc fluid challenge    Plan: Abx   HEME  nad    Plan: follow CBC for now   ENDO  Hyponatremia (moderate (125 - 135 meq/dl))    Plan: likely 2/2 to aggressive diurses-cscale back Lasix  Seems to have an element of R sided Hf with possibility of Shiock liver, but her Alk phos is normal for a lady of this age- Her ALT is elevated, which is a specific indicator of liver dysfunx--will trend her CMet over 24 hours and review am    Global Issues  See above -Meets sepsis criterion-would cycle troponins one more time in view of remote cardiac h/o with afib NEED ECHO READ-Would be good surrogate for CVP       LOS: 2 days   Additional  comments:I reviewed the patient's new clinical lab test results. and her hemodynamics, I reviewed the patient's other test results. ,  I personally viewed and interpreted the patient's telemetry data: which shows PVC's, with no pauses and I have discussed and reviewed with family members patient's WHICH IN INTEND TO DO  Critical Care Total Time*: 1 Hour  Keon Benscoter,JAI 01/10/2011  *Care during the described time interval was provided by me and/or other providers on the critical care team.  I have reviewed this patient's available data, including medical history, events of note, physical examination and test results as part of my evaluation.  I will ALSO DISCUSs THIS PLAN OF CARE EXHAUSTIVELY WITH PATIENT AND SURROGATE DECISION MAKERS

## 2011-01-10 NOTE — Progress Notes (Signed)
Reason for Consult: Acute respiratory failure Referring Physician: TRH Mahala Menghini)  Desiree [name] is an 77 y.o. female.  CHF diastolic , bilat pl effusions, ?CAP, hypotension, acute resp failure  Past Medical History  Diagnosis Date  . Diverticulitis - chronic constipation       . Hypertension       . Hypothyroid   . Anemia   . Hypercholesterolemia   . Hypothyroidism   . Osteoarthritis with chronic pain syndrome       . Paroxysmal atrial fibrillation Desiree [name]) - not on warfarin       . CHF (congestive heart failure), NYHA class II             "Heart Murmur"  Past Surgical History  Procedure Date  . Carotid endarterectomy   . Carpal tunnel release   . Rotator cuff repair     2 on right, 1 on left  . Abdominal hysterectomy   . Cataract extraction   . Knee surgery     bilateral    History reviewed. No pertinent family history.  Social History:  reports that she has never smoked. She has never used smokeless tobacco. She reports that she does not drink alcohol or use illicit drugs.   Results for orders placed during the hospital encounter of 01/08/11 (from the past 48 hour(s))  CBC     Status: Abnormal   Collection Time   01/08/11 11:00 AM      Component Value Range Comment   WBC 8.0  4.0 - 10.5 (K/uL)    RBC 3.78 (*) 3.87 - 5.11 (MIL/uL)    Hemoglobin 11.7 (*) 12.0 - 15.0 (g/dL)    HCT 96.0 (*) 45.4 - 46.0 (%)    MCV 94.2  78.0 - 100.0 (fL)    MCH 31.0  26.0 - 34.0 (pg)    MCHC 32.9  30.0 - 36.0 (g/dL)    RDW 09.8  11.9 - 14.7 (%)    Platelets 249  150 - 400 (K/uL)   DIFFERENTIAL     Status: Abnormal   Collection Time   01/08/11 11:00 AM      Component Value Range Comment   Neutrophils Relative 83 (*) 43 - 77 (%)    Neutro Abs 6.7  1.7 - 7.7 (K/uL)    Lymphocytes Relative 10 (*) 12 - 46 (%)    Lymphs Abs 0.8  0.7 - 4.0 (K/uL)    Monocytes Relative 5  3 - 12 (%)    Monocytes Absolute 0.4  0.1 - 1.0 (K/uL)    Eosinophils Relative 2  0 - 5 (%)    Eosinophils  Absolute 0.2  0.0 - 0.7 (K/uL)    Basophils Relative 1  0 - 1 (%)    Basophils Absolute 0.1  0.0 - 0.1 (K/uL)   COMPREHENSIVE METABOLIC PANEL     Status: Abnormal   Collection Time   01/08/11 11:00 AM      Component Value Range Comment   Sodium 131 (*) 135 - 145 (mEq/L)    Potassium 4.3  3.5 - 5.1 (mEq/L)    Chloride 92 (*) 96 - 112 (mEq/L)    CO2 29  19 - 32 (mEq/L)    Glucose, Bld 155 (*) 70 - 99 (mg/dL)    BUN 34 (*) 6 - 23 (mg/dL)    Creatinine, Ser 8.29 (*) 0.50 - 1.10 (mg/dL)    Calcium 9.6  8.4 - 10.5 (mg/dL)    Total Protein 7.2  6.0 -  8.3 (g/dL)    Albumin 3.0 (*) 3.5 - 5.2 (g/dL)    AST 32  0 - 37 (U/L)    ALT 52 (*) 0 - 35 (U/L)    Alkaline Phosphatase 119 (*) 39 - 117 (U/L)    Total Bilirubin 0.4  0.3 - 1.2 (mg/dL)    GFR calc non Af Amer 38 (*) >90 (mL/min)    GFR calc Af Amer 44 (*) >90 (mL/min)   LIPASE, BLOOD     Status: Abnormal   Collection Time   01/08/11 11:00 AM      Component Value Range Comment   Lipase 8 (*) 11 - 59 (U/L)   PROCALCITONIN     Status: Normal   Collection Time   01/08/11 11:00 AM      Component Value Range Comment   Procalcitonin <0.10     PRO B NATRIURETIC PEPTIDE     Status: Abnormal   Collection Time   01/08/11 11:02 AM      Component Value Range Comment   BNP, POC 5870.0 (*) 0 - 450 (pg/mL)   POCT I-STAT TROPONIN I     Status: Normal   Collection Time   01/08/11 11:14 AM      Component Value Range Comment   Troponin i, poc 0.02  0.00 - 0.08 (ng/mL)    Comment 3            URINALYSIS, ROUTINE W REFLEX MICROSCOPIC     Status: Normal   Collection Time   01/08/11 11:49 AM      Component Value Range Comment   Color, Urine YELLOW  YELLOW     Appearance CLEAR  CLEAR     Specific Gravity, Urine 1.023  1.005 - 1.030     pH 5.5  5.0 - 8.0     Glucose, UA NEGATIVE  NEGATIVE (mg/dL)    Hgb urine dipstick NEGATIVE  NEGATIVE     Bilirubin Urine NEGATIVE  NEGATIVE     Ketones, ur NEGATIVE  NEGATIVE (mg/dL)    Protein, ur NEGATIVE   NEGATIVE (mg/dL)    Urobilinogen, UA 1.0  0.0 - 1.0 (mg/dL)    Nitrite NEGATIVE  NEGATIVE     Leukocytes, UA NEGATIVE  NEGATIVE  MICROSCOPIC NOT DONE ON URINES WITH NEGATIVE PROTEIN, BLOOD, LEUKOCYTES, NITRITE, OR GLUCOSE <1000 mg/dL.  URINE CULTURE     Status: Normal   Collection Time   01/08/11 11:49 AM      Component Value Range Comment   Specimen Description URINE, CATHETERIZED      Special Requests NONE      Setup Time [id number]      Colony Count NO GROWTH      Culture NO GROWTH      Report Status 01/10/2011 FINAL     OCCULT BLOOD, POC DEVICE     Status: Normal   Collection Time   01/08/11 11:58 AM      Component Value Range Comment   Fecal Occult Bld NEGATIVE     LACTIC ACID, PLASMA     Status: Normal   Collection Time   01/08/11  1:30 PM      Component Value Range Comment   Lactic Acid, Venous 2.0  0.5 - 2.2 (mmol/L)   BLOOD GAS, ARTERIAL     Status: Abnormal   Collection Time   01/08/11  2:15 PM      Component Value Range Comment   FIO2 1.00      Delivery systems VENTILATOR  Mode BILEVEL POSITIVE AIRWAY PRESSURE      Inspiratory PAP 10      Expiratory PAP 5.0      pH, Arterial 7.465 (*) 7.350 - 7.400     pCO2 arterial 39.5  35.0 - 45.0 (mmHg)    pO2, Arterial 85.0  80.0 - 100.0 (mmHg)    Bicarbonate 28.0 (*) 20.0 - 24.0 (mEq/L)    TCO2 25.3  0 - 100 (mmol/L)    Acid-Base Excess 4.4 (*) 0.0 - 2.0 (mmol/L)    O2 Saturation 96.6      Patient temperature 98.6      Collection site RIGHT RADIAL      Drawn by [id number]      Sample type ARTERIAL      Allens test (pass/fail) PASS  PASS    CARDIAC PANEL(CRET KIN+CKTOT+MB+TROPI)     Status: Normal   Collection Time   01/08/11  5:18 PM      Component Value Range Comment   Total CK 60  7 - 177 (U/L)    CK, MB 3.7  0.3 - 4.0 (ng/mL)    Troponin I <0.30  <0.30 (ng/mL)    Relative Index RELATIVE INDEX IS INVALID  0.0 - 2.5    BLOOD GAS, ARTERIAL     Status: Abnormal   Collection Time   01/08/11  5:23 PM       Component Value Range Comment   FIO2 .50      Delivery systems OXYGEN MASK      pH, Arterial 7.454 (*) 7.350 - 7.400     pCO2 arterial 39.2  35.0 - 45.0 (mmHg)    pO2, Arterial 51.0 (*) 80.0 - 100.0 (mmHg)    Bicarbonate 27.1 (*) 20.0 - 24.0 (mEq/L)    TCO2 24.6  0 - 100 (mmol/L)    Acid-Base Excess 3.4 (*) 0.0 - 2.0 (mmol/L)    O2 Saturation 83.5      Patient temperature 98.6      Collection site RIGHT RADIAL      Drawn by [id number]      Sample type ARTERIAL      Allens test (pass/fail) PASS  PASS    LEGIONELLA ANTIGEN, URINE     Status: Normal   Collection Time   01/09/11 12:04 AM      Component Value Range Comment   Specimen Description URINE, CATHETERIZED      Special Requests NONE      Legionella Antigen, Urine Negative for Legionella pneumophilia serogroup 1      Report Status 01/09/2011 FINAL     STREP PNEUMONIAE URINARY ANTIGEN     Status: Normal   Collection Time   01/09/11 12:04 AM      Component Value Range Comment   Strep Pneumo Urinary Antigen NEGATIVE  NEGATIVE    MRSA PCR SCREENING     Status: Normal   Collection Time   01/09/11  1:39 AM      Component Value Range Comment   MRSA by PCR NEGATIVE  NEGATIVE    CBC     Status: Abnormal   Collection Time   01/09/11  3:33 AM      Component Value Range Comment   WBC 7.3  4.0 - 10.5 (K/uL)    RBC 3.67 (*) 3.87 - 5.11 (MIL/uL)    Hemoglobin 11.3 (*) 12.0 - 15.0 (g/dL)    HCT 81.1 (*) 91.4 - 46.0 (%)    MCV 90.5  78.0 - 100.0 (fL)  MCH 30.8  26.0 - 34.0 (pg)    MCHC 34.0  30.0 - 36.0 (g/dL)    RDW 16.1  09.6 - 04.5 (%)    Platelets 214  150 - 400 (K/uL)   PROCALCITONIN     Status: Normal   Collection Time   01/09/11  3:34 AM      Component Value Range Comment   Procalcitonin <0.10     PRO B NATRIURETIC PEPTIDE     Status: Abnormal   Collection Time   01/09/11  3:40 AM      Component Value Range Comment   BNP, POC 3256.0 (*) 0 - 450 (pg/mL)   BLOOD GAS, ARTERIAL     Status: Abnormal   Collection Time    01/09/11  4:28 AM      Component Value Range Comment   FIO2 1.00      Delivery systems NON-REBREATHER OXYGEN MASK      pH, Arterial 7.431 (*) 7.350 - 7.400     pCO2 arterial 43.3  35.0 - 45.0 (mmHg)    pO2, Arterial 85.8  80.0 - 100.0 (mmHg)    Bicarbonate 28.3 (*) 20.0 - 24.0 (mEq/L)    TCO2 25.7  0 - 100 (mmol/L)    Acid-Base Excess 4.0 (*) 0.0 - 2.0 (mmol/L)    O2 Saturation 96.5      Patient temperature 98.6      Collection site LEFT RADIAL      Drawn by [id number]      Sample type ARTERIAL DRAW      Allens test (pass/fail) PASS  PASS    CULTURE, BLOOD (ROUTINE X 2)     Status: Normal (Preliminary result)   Collection Time   01/09/11  6:40 AM      Component Value Range Comment   Specimen Description BLOOD LEFT ARM      Special Requests BOTTLES DRAWN AEROBIC AND ANAEROBIC 6CC      Setup Time [id number]      Culture        Value:        BLOOD CULTURE RECEIVED NO GROWTH TO DATE CULTURE WILL BE HELD FOR 5 DAYS BEFORE ISSUING A FINAL NEGATIVE REPORT   Report Status PENDING     CULTURE, BLOOD (ROUTINE X 2)     Status: Normal (Preliminary result)   Collection Time   01/09/11  6:50 AM      Component Value Range Comment   Specimen Description BLOOD LEFT HAND      Special Requests BOTTLES DRAWN AEROBIC AND ANAEROBIC 7CC      Setup Time [id number]      Culture        Value:        BLOOD CULTURE RECEIVED NO GROWTH TO DATE CULTURE WILL BE HELD FOR 5 DAYS BEFORE ISSUING A FINAL NEGATIVE REPORT   Report Status PENDING     BLOOD GAS, ARTERIAL     Status: Abnormal   Collection Time   01/09/11  4:00 PM      Component Value Range Comment   FIO2 .50      Delivery systems OXYGEN MASK      pH, Arterial 7.482 (*) 7.350 - 7.400     pCO2 arterial 42.0  35.0 - 45.0 (mmHg)    pO2, Arterial 65.4 (*) 80.0 - 100.0 (mmHg)    Bicarbonate 31.1 (*) 20.0 - 24.0 (mEq/L)    TCO2 27.8  0 - 100 (mmol/L)    Acid-Base Excess 7.3 (*)  0.0 - 2.0 (mmol/L)    O2 Saturation 93.8      Patient temperature 98.6       Collection site LEFT BRACHIAL      Drawn by [phone]      Sample type ARTERIAL DRAW     LACTIC ACID, PLASMA     Status: Normal   Collection Time   01/10/11  3:25 AM      Component Value Range Comment   Lactic Acid, Venous 0.8  0.5 - 2.2 (mmol/L)   COMPREHENSIVE METABOLIC PANEL     Status: Abnormal   Collection Time   01/10/11  3:30 AM      Component Value Range Comment   Sodium 134 (*) 135 - 145 (mEq/L)    Potassium 2.7 (*) 3.5 - 5.1 (mEq/L)    Chloride 91 (*) 96 - 112 (mEq/L)    CO2 33 (*) 19 - 32 (mEq/L)    Glucose, Bld 125 (*) 70 - 99 (mg/dL)    BUN 24 (*) 6 - 23 (mg/dL)    Creatinine, Ser 0.45  0.50 - 1.10 (mg/dL)    Calcium 9.0  8.4 - 10.5 (mg/dL)    Total Protein 6.3  6.0 - 8.3 (g/dL)    Albumin 2.7 (*) 3.5 - 5.2 (g/dL)    AST 29  0 - 37 (U/L)    ALT 48 (*) 0 - 35 (U/L)    Alkaline Phosphatase 97  39 - 117 (U/L)    Total Bilirubin 0.3  0.3 - 1.2 (mg/dL)    GFR calc non Af Amer 53 (*) >90 (mL/min)    GFR calc Af Amer 61 (*) >90 (mL/min)   PRO B NATRIURETIC PEPTIDE     Status: Abnormal   Collection Time   01/10/11  3:30 AM      Component Value Range Comment   BNP, POC 999.3 (*) 0 - 450 (pg/mL)   CBC     Status: Abnormal   Collection Time   01/10/11  3:30 AM      Component Value Range Comment   WBC 7.8  4.0 - 10.5 (K/uL)    RBC 3.68 (*) 3.87 - 5.11 (MIL/uL)    Hemoglobin 11.3 (*) 12.0 - 15.0 (g/dL)    HCT 40.9 (*) 81.1 - 46.0 (%)    MCV 91.3  78.0 - 100.0 (fL)    MCH 30.7  26.0 - 34.0 (pg)    MCHC 33.6  30.0 - 36.0 (g/dL)    RDW 91.4  78.2 - 95.6 (%)    Platelets 227  150 - 400 (K/uL)     Dg Chest Right Decubitus  01/08/2011  *RADIOLOGY REPORT*  Clinical Data: Shortness of breath and weakness  CHEST - RIGHT DECUBITUS  Comparison: 01/08/2011 chest radiograph  Findings: Detail is obscured by the patient's arms.  Bilateral free flowing moderate sized pleural effusions are again noted.  The right lung is obscured by overlying soft tissue.  The visualized left lung  field is clear but the costophrenic angle is omitted from the field of view.  IMPRESSION: Bilateral moderate free flowing pleural effusions.  Original Report Authenticated By: Harrel Lemon, M.D.   Dg Chest Port 1 View  01/10/2011  *RADIOLOGY REPORT*  Clinical Data: Congestive heart failure.  PORTABLE CHEST - 1 VIEW  Comparison: Plain films of the chest 01/08/2011 and 01/09/2011.  Findings: Right greater than left pleural effusions and airspace disease persist.  The patient's pleural effusions appear slightly decreased since yesterday's examination.  Heart  size is upper normal.  IMPRESSION: Right greater than left pleural effusions and basilar airspace disease.  The patient's pleural effusions appear slightly improved.  Original Report Authenticated By: Bernadene Bell. Maricela Curet, M.D.   Dg Chest Port 1 View  01/09/2011  *RADIOLOGY REPORT*  Clinical Data: Congestive heart failure, follow-up  PORTABLE CHEST - 1 VIEW  Comparison: Chest x-ray of 01/08/2011  Findings: The lungs remain poorly aerated.  There is little change in cardiomegaly, bilateral pleural effusions and airspace disease most consistent with edema.  Pneumonia cannot be excluded.  IMPRESSION: No change in poor aeration and probable edema with effusions. Cannot exclude pneumonia.  Original Report Authenticated By: Juline Patch, M.D.   Dg Chest Port 1 View  01/08/2011  *RADIOLOGY REPORT*  Clinical Data: Shortness of breath, anxiety, nausea  PORTABLE CHEST - 1 VIEW  Comparison: Chest x-ray of 09/27/2007  Findings: The lungs are poorly aerated with airspace disease bilaterally.  There appears to be a right pleural effusion present as well.  These findings most likely represent pneumonia although edema cannot be excluded.  The heart is mildly enlarged.  No bony abnormality is seen.  IMPRESSION: Bilateral airspace disease with right effusion.  Consider pneumonia versus edema.  Original Report Authenticated By: Juline Patch, M.D.    Blood  pressure 93/71, pulse 92, temperature 99.7 F (37.6 C), temperature source Core (Comment), resp. rate 16, height 4\' 11"  (1.499 m), weight 59 kg (130 lb 1.1 oz), SpO2 96.00%.  Physical Exam  Constitutional: Awake and follows commands. Currently on [place] oxygen  HENT:  Head: Normocephalic and atraumatic.  Eyes: Conjunctivae are normal. Pupils are equal, round, and reactive to light. Left eye exhibits no discharge. No scleral icterus.  Neck: Normal range of motion. Neck supple. No JVD present. No tracheal deviation present. No thyromegaly present.  Cardiovascular: Normal rate and regular rhythm.   Murmur (holosystolic 306 murmur right and left upper sternal edge with radiation to patent) heard.      3/6 holosyst M   No periph edema Respiratory:       Dull to perc 1/4  up on R and 1/4 up on L. Diminished BS in L base. Bronchial BS and egophony in R base  GI: Soft. Bowel sounds are normal. She exhibits no distension. There is no tenderness. There is no rebound and no guarding.  Musculoskeletal: Normal range of motion. She exhibits no edema and no tenderness.  Lymphadenopathy:    She has no cervical adenopathy.  Neurological: She is alert and oriented to person, place, and time. She has normal reflexes. She displays normal reflexes. No cranial nerve deficit. She exhibits normal muscle tone. Coordination normal.  Skin: Skin is warm and dry. No rash noted. No erythema.  Psychiatric: She has a normal mood and affect. Her behavior is normal. Judgment and thought content normal.    Assessment/Plan:  1. Hypoxic resp failure in setting of presumed pneumonia and CHF with BNP >5000 on admit now 1000 on 11/17 . Bilateral r>l free flowing effusions improved with diuresis. A. O2 as needed B. Diuresis as tolerated>>slow rate of diuresis in setting of hypotension 11/17. Doubt septic shock, she does not need any volume or vasopressors now. I do not feel CVL is needed. C. May need/benefit from  thoracentesis>>trend CXR  May yet need this on the R. D. BD's E. D/c bilevel, no longer needed  2. Presumed CAP 1. Cont abx as ordered>>given neg c/s data narrow to rocephin daily 2. Cultures pending  3. Pulmonary edema>>improved 11/17 A. Diuresis as tolerated>>will slow rate of this.  Echo is still pending , need to see results.   Desiree [name] 01/10/2011 9:37 AM  Transfer pt to PCCM primary service for now Desiree [name] PCCM Service   Beeper  [phone]  Cell  [phone]

## 2011-01-10 NOTE — Progress Notes (Signed)
CRITICAL VALUE ALERT  Critical value received:  K 2.7  Date of notification:  01/10/2011  Time of notification:  0440  Critical value read back:yes  Nurse who received alert:  Terressa Koyanagi RN  MD notified (1st page):  Burnadette Peter NP  Time of first page:  0440  MD notified (2nd page):  Time of second page:  Responding MD:  Burnadette Peter, NP  Time MD responded:  [phone]

## 2011-01-10 NOTE — Progress Notes (Signed)
LATE ENTRY I went back to review patient yesterday at about 18:00.  She seemdd to be very comfortable on [place] hi flow and was laughing and joking with numerous family members-her RR was in the teens  I discussed extensively with her Daughter, Ms C Montijo possible etiologies and think that she might have had actually 2 processes 1) CAP 2)Flash pulm edema (2/2 to above) vs Acute decomp Diast CHF from slowly progressing CHF decompensation (echo not read yet)  She understood my explanation of both of these disease processes that were explained in layman/woman terms  She is still SDU eligible given her propensity to have resp decompensation, but overall doing much better.  I spent 15 minutes reviewing labs, tests and 2/3 of that time was direct face-face time  [facility]

## 2011-01-10 NOTE — Consult Note (Signed)
Desiree [name]  77 y.o.  female  Subjective: Patient feels somewhat worse than she did yesterday in terms of fatigue and malaise; dyspnea is improved since admission. She reports no chest discomfort.  Allergy: Celebrex and Diovan  Objective: Vital signs in last 24 hours: Temp:  [98 F (36.7 C)-100.4 F (38 C)] 99.7 F (37.6 C) (11/17 1000) Pulse Rate:  [72-95] 95  (11/17 1000) Resp:  [13-21] 16  (11/17 0800) BP: (93-124)/(43-71) 117/46 mmHg (11/17 1000) SpO2:  [90 %-97 %] 95 % (11/17 1000) FiO2 (%):  [50 %] 50 % (11/16 1600) Weight:  [59 kg (130 lb 1.1 oz)] 130 lb 1.1 oz (59 kg) (11/17 0000)  130 lb 1.1 oz (59 kg) Body mass index is 26.27 kg/(m^2).  Weight change: -3.596 kg (-7 lb 14.9 oz) Last BM Date: 01/08/11  Intake/Output from previous day: 11/16 0701 - 11/17 0700 In: 800 [P.O.:360; I.V.:240; IV Piggyback:200] Out: 1795 [Urine:1795]  General-Well developed; no acute distress; thin and chronically ill-appearing Neck-mild JVD Lungs:  basilar rales Cardiovascular-normal PMI; normal S1 and S2; grade 3/6 holosystolic murmur at the left sternal border and apex Abdomen-normal bowel sounds; soft and non-tender without masses or organomegaly Skin-Warm, no significant lesions Extremities-markedly decreased distal pulses; no pedal edema, but trace presacral edema present  Lab Results: Cardiac Markers:   Basename 01/10/11 0912 01/08/11 1718  TROPONINI <0.30 <0.30   CBC:   Basename 01/10/11 0330 01/09/11 0333  WBC 7.8 7.3  HGB 11.3* 11.3*  HCT 33.6* 33.2*  PLT 227 214   BMET:  Basename 01/10/11 0330 01/08/11 1100  NA 134* 131*  K 2.7* 4.3  CL 91* 92*  CO2 33* 29  GLUCOSE 125* 155*  BUN 24* 34*  CREATININE 0.96 1.25*  CALCIUM 9.0 9.6   Hepatic Function:   Basename 01/10/11 0330  PROT 6.3  ALBUMIN 2.7*  AST 29  ALT 48*  ALKPHOS 97  BILITOT 0.3  BILIDIR --  IBILI --   GFR:  Estimated Creatinine Clearance: 34.1 ml/min (by C-G formula based on Cr of 0.96).  EKG:   Normal except for frequent PVCs.  Imaging Studies/Results: Dg Chest Right Decubitus 01/10/2011  *RADIOLOGY REPORT*  Clinical Data: Congestive heart failure.  PORTABLE CHEST - 1 VIEW  Comparison: Plain films of the chest 01/08/2011 and 01/09/2011.  Findings: Right greater than left pleural effusions and airspace disease persist.  The patient's pleural effusions appear slightly decreased since yesterday's examination.  Heart size is upper normal.  IMPRESSION: Right greater than left pleural effusions and basilar airspace disease.  The patient's pleural effusions appear slightly improved.  Original Report Authenticated By: Bernadene Bell. Maricela Curet, M.D.   Imaging: Imaging results have been reviewed  Medications: I have reviewed the patient's current medications. Principal Problem:  *Acute respiratory failure with hypoxia Active Problems:  HTN (hypertension)  Nausea alone  Diverticulitis  Pleural effusion due to CHF (congestive heart failure)  Abdominal pain  Acute diastolic congestive heart failure  CAP (community acquired pneumonia)  Hypotension  Anemia, mild  Systolic murmur  Hypokalemia   Assessment/Plan: Congestive heart failure-echocardiogram is pending. Murmur suggests the presence of significant mitral regurgitation. Normal EKG suggests that LV systolic function may be normal.  Echocardiogram performed 2 days ago, but not yet read-I will review images if they can be located.  BNP has improved dramatically with excellent diuresis and significant weight loss on a fairly small dose of intravenous furosemide, which will be continued.  Marked hypokalemia with initial diuresis. Replacement therapy will be  provided and spironolactone started.  Hypertension: Adequately controlled with current medication.  Anemia: Iron and stool Hemoccults will be obtained  LOS: 2 days   Laconia Bing 01/10/2011, 11:24 AM

## 2011-01-11 ENCOUNTER — Inpatient Hospital Stay (HOSPITAL_COMMUNITY): Payer: 59

## 2011-01-11 ENCOUNTER — Other Ambulatory Visit (HOSPITAL_COMMUNITY): Payer: 59

## 2011-01-11 DIAGNOSIS — J13 Pneumonia due to Streptococcus pneumoniae: Secondary | ICD-10-CM

## 2011-01-11 DIAGNOSIS — J9600 Acute respiratory failure, unspecified whether with hypoxia or hypercapnia: Secondary | ICD-10-CM

## 2011-01-11 DIAGNOSIS — J90 Pleural effusion, not elsewhere classified: Secondary | ICD-10-CM

## 2011-01-11 LAB — DIFFERENTIAL
Basophils Absolute: 0 10*3/uL (ref 0.0–0.1)
Basophils Relative: 1 % (ref 0–1)
Eosinophils Relative: 4 % (ref 0–5)
Lymphocytes Relative: 11 % — ABNORMAL LOW (ref 12–46)
Monocytes Absolute: 0.4 10*3/uL (ref 0.1–1.0)
Neutro Abs: 4.9 10*3/uL (ref 1.7–7.7)

## 2011-01-11 LAB — BASIC METABOLIC PANEL
BUN: 13 mg/dL (ref 6–23)
Creatinine, Ser: 0.67 mg/dL (ref 0.50–1.10)
GFR calc Af Amer: >90 mL/min (ref >90)
GFR calc non Af Amer: 79 mL/min — ABNORMAL LOW (ref >90)
Potassium: 3.6 mEq/L (ref 3.5–5.1)

## 2011-01-11 LAB — LIPID PANEL
HDL: 39 mg/dL — ABNORMAL LOW (ref >39)
LDL Cholesterol: 66 mg/dL (ref 0–99)
Total CHOL/HDL Ratio: 3 RATIO
Triglycerides: 57 mg/dL (ref <150)

## 2011-01-11 LAB — CBC
HCT: 33 % — ABNORMAL LOW (ref 36.0–46.0)
HCT: 32.8 % — ABNORMAL LOW (ref 36.0–46.0)
Hemoglobin: 11.1 g/dL — ABNORMAL LOW (ref 12.0–15.0)
MCHC: 34.1 g/dL (ref 30.0–36.0)
MCV: 90.9 fL (ref 78.0–100.0)
Platelets: 223 10*3/uL (ref 150–400)
RBC: 3.63 MIL/uL — ABNORMAL LOW (ref 3.87–5.11)
RDW: 13.1 % (ref 11.5–15.5)
RDW: 13.1 % (ref 11.5–15.5)
WBC: 5.3 10*3/uL (ref 4.0–10.5)
WBC: 6.3 10*3/uL (ref 4.0–10.5)

## 2011-01-11 LAB — PRO B NATRIURETIC PEPTIDE: Pro B Natriuretic peptide (BNP): 495.2 pg/mL — ABNORMAL HIGH (ref 0–450)

## 2011-01-11 MED ORDER — FUROSEMIDE 10 MG/ML IJ SOLN
40.0000 mg | Freq: Two times a day (BID) | INTRAMUSCULAR | Status: DC
  Administered 2011-01-11 – 2011-01-13 (×5): 40 mg via INTRAVENOUS
  Filled 2011-01-11 (×6): qty 4

## 2011-01-11 MED ORDER — MOXIFLOXACIN HCL 400 MG PO TABS
400.0000 mg | ORAL_TABLET | Freq: Every day | ORAL | Status: DC
  Administered 2011-01-11 – 2011-01-12 (×2): 400 mg via ORAL
  Filled 2011-01-11 (×3): qty 1

## 2011-01-11 MED ORDER — PANTOPRAZOLE SODIUM 40 MG PO TBEC
40.0000 mg | DELAYED_RELEASE_TABLET | Freq: Every day | ORAL | Status: DC
  Administered 2011-01-11 – 2011-01-15 (×5): 40 mg via ORAL
  Filled 2011-01-11 (×5): qty 1

## 2011-01-11 NOTE — Progress Notes (Signed)
Follow up - Critical Care Medicine Note  Patient Details:    Desiree [name] is an 77 y.o. female. Patient Description     Lines/tubes Location Start Stop  none      Culture Date Result  BCx2 11/16    Antibiotic Indication Start Stop  Rocephin  avelox vanco azithro Cap Cap Cap cap 11/15 11/18 11/15  11/15 11/17  11/17 11/17   GI Prophylaxis DVT Prophylaxis  PPI SCD   Protocols     Consultants  Cards LHC     Date Events      Subjective:    Overnight Issues:  Less dyspneic, c/o back pain, weak.  Bmet pending Objective:  Vital signs  Temp:  [98.6 F (37 C)-101.3 F (38.5 C)] 99.1 F (37.3 C) (11/18 0600) Pulse Rate:  [80-99] 82  (11/18 0600) Resp:  [15-23] 23  (11/18 0600) BP: (93-121)/(45-71) 103/51 mmHg (11/18 0600) SpO2:  [92 %-98 %] 92 % (11/18 0600) Weight:  [60.1 kg (132 lb 7.9 oz)] 132 lb 7.9 oz (60.1 kg) (11/18 0500)  Hemodynamic parameters for last 24 hours:     Intake/Output Summary (Last 24 hours) at 01/11/11 0828 Last data filed at 01/11/11 0700  Gross per 24 hour  Intake   1844 ml  Output   1661 ml  Net    183 ml    Physical Exam:  Filed Vitals:   01/11/11 0000 01/11/11 0400 01/11/11 0500 01/11/11 0600  BP: 105/53 109/51  103/51  Pulse: 81 86  82  Temp: 99 F (37.2 C) 98.6 F (37 C)  99.1 F (37.3 C)  TempSrc:      Resp: 20 23  23   Height:      Weight: 60.1 kg (132 lb 7.9 oz)  60.1 kg (132 lb 7.9 oz)   SpO2: 98% 94%  92%    Gen: Pleasant, well-nourished, in no distress,  normal affect  ENT: No lesions,  mouth clear,  oropharynx clear,  Neck: No JVD, no TMG, no carotid bruits  Lungs: No use of accessory muscles, no dullness to percussion, decreased BS at R base. Decreased rales  Cardiovascular: RRR, heart sounds normal, no murmur or gallops, no peripheral edema  Abdomen: soft and NT, no HSM,  BS normal  Musculoskeletal: No deformities, no cyanosis or clubbing  Neuro: alert, non focal  Skin: Warm, no lesions or  rashes  Dg Chest Port 1 View  01/11/2011  *RADIOLOGY REPORT*  Clinical Data: Pulmonary edema, pleural effusions  PORTABLE CHEST - 1 VIEW  Comparison: 01/10/2011  Findings: Right lower lobe opacity, likely a combination of atelectasis and effusion, improved.  Near complete resolution of left pleural effusion.  No pneumothorax.  The heart is normal in size.  Degenerative changes in the bilateral shoulders.  IMPRESSION: Right pleural effusion with associated atelectasis, improved.  Near complete resolution of left pleural effusion.  Original Report Authenticated By: Charline Bills, M.D.   Dg Chest Port 1 View  01/10/2011  *RADIOLOGY REPORT*  Clinical Data: Congestive heart failure.  PORTABLE CHEST - 1 VIEW  Comparison: Plain films of the chest 01/08/2011 and 01/09/2011.  Findings: Right greater than left pleural effusions and airspace disease persist.  The patient's pleural effusions appear slightly decreased since yesterday's examination.  Heart size is upper normal.  IMPRESSION: Right greater than left pleural effusions and basilar airspace disease.  The patient's pleural effusions appear slightly improved.  Original Report Authenticated By: Bernadene Bell. D'ALESSIO, M.D.   Dg Chest [facility]  01/09/2011  *RADIOLOGY REPORT*  Clinical Data: Congestive heart failure, follow-up  PORTABLE CHEST - 1 VIEW  Comparison: Chest x-ray of 01/08/2011  Findings: The lungs remain poorly aerated.  There is little change in cardiomegaly, bilateral pleural effusions and airspace disease most consistent with edema.  Pneumonia cannot be excluded.  IMPRESSION: No change in poor aeration and probable edema with effusions. Cannot exclude pneumonia.  Original Report Authenticated By: Juline Patch, M.D.        LAB RESULTS BMET    Component Value Date/Time   NA 134* 01/10/2011 0330   K 2.7* 01/10/2011 0330   CL 91* 01/10/2011 0330   CO2 33* 01/10/2011 0330   GLUCOSE 125* 01/10/2011 0330   BUN 24* 01/10/2011 0330    CREATININE 0.96 01/10/2011 0330   CALCIUM 9.0 01/10/2011 0330   GFRNONAA 53* 01/10/2011 0330   GFRAA 61* 01/10/2011 0330   CBC    Component Value Date/Time   WBC 5.3 01/11/2011 0335   WBC 3.9 08/17/2005 1136   RBC 3.63* 01/11/2011 0335   HGB 11.1* 01/11/2011 0335   HGB 12.2 08/17/2005 1136   HCT 33.0* 01/11/2011 0335   HCT 35.6 08/17/2005 1136   PLT 204 01/11/2011 0335   PLT 183 08/17/2005 1136   MCV 90.9 01/11/2011 0335   MCV 92.1 08/17/2005 1136   MCH 30.6 01/11/2011 0335   MCHC 33.6 01/11/2011 0335   RDW 13.1 01/11/2011 0335   LYMPHSABS 0.8 01/08/2011 1100   MONOABS 0.4 01/08/2011 1100   EOSABS 0.2 01/08/2011 1100   EOSABS 0.1 08/17/2005 1136   BASOSABS 0.1 01/08/2011 1100   BASOSABS 0.0 08/17/2005 1136   ABG    Component Value Date/Time   PHART 7.482* 01/09/2011 1600   HCO3 31.1* 01/09/2011 1600   TCO2 27.8 01/09/2011 1600   O2SAT 93.8 01/09/2011 1600    Additional lab data  Radiology Dg Chest Port 1 View  01/11/2011  *RADIOLOGY REPORT*  Clinical Data: Pulmonary edema, pleural effusions  PORTABLE CHEST - 1 VIEW  Comparison: 01/10/2011  Findings: Right lower lobe opacity, likely a combination of atelectasis and effusion, improved.  Near complete resolution of left pleural effusion.  No pneumothorax.  The heart is normal in size.  Degenerative changes in the bilateral shoulders.  IMPRESSION: Right pleural effusion with associated atelectasis, improved.  Near complete resolution of left pleural effusion.  Original Report Authenticated By: Charline Bills, M.D.   Dg Chest Port 1 View  01/10/2011  *RADIOLOGY REPORT*  Clinical Data: Congestive heart failure.  PORTABLE CHEST - 1 VIEW  Comparison: Plain films of the chest 01/08/2011 and 01/09/2011.  Findings: Right greater than left pleural effusions and airspace disease persist.  The patient's pleural effusions appear slightly decreased since yesterday's examination.  Heart size is upper normal.  IMPRESSION: Right greater than  left pleural effusions and basilar airspace disease.  The patient's pleural effusions appear slightly improved.  Original Report Authenticated By: Bernadene Bell. Maricela Curet, M.D.   Dg Chest Port 1 View  01/09/2011  *RADIOLOGY REPORT*  Clinical Data: Congestive heart failure, follow-up  PORTABLE CHEST - 1 VIEW  Comparison: Chest x-ray of 01/08/2011  Findings: The lungs remain poorly aerated.  There is little change in cardiomegaly, bilateral pleural effusions and airspace disease most consistent with edema.  Pneumonia cannot be excluded.  IMPRESSION: No change in poor aeration and probable edema with effusions. Cannot exclude pneumonia.  Original Report Authenticated By: Juline Patch, M.D.   Assessment/Plan:  Principal Problem:  *Acute respiratory failure  with hypoxia Improved with diuresis now off bilevel  Active Problems:  HTN (hypertension) Having hypertensive meds except for Lopressor at this time  Pleural effusion due to CHF (congestive heart failure) Improved right pleural effusion and resolved the left pleural effusion Plan Continued slow diuresis   Acute diastolic congestive heart failure Echo shows ejection fraction 75% Plan Per cardiology Continue beta blocker   CAP (community acquired pneumonia) All cultures negative Plan Change Rocephin to oral Avelox   Hypotension Resolved with slowing of diuresis Plan Continued slow diuresis   Anemia, mild Monitor CBC daily transfuse for hemoglobin less than 8    Hypokalemia Recheck b met today   LOS: 3 days   Additional comments:I reviewed the patient's new clinical lab test results.   Critical Care Total Time*: 30 Minutes  Shan Levans PCCM Service   Beeper  [phone]  Cell  [phone] Shan Levans 01/11/2011  *Care during the described time interval was provided by me and/or other providers on the critical care team.  I have reviewed this patient's available data, including medical history, events of note,  physical examination and test results as part of my evaluation.

## 2011-01-11 NOTE — Consult Note (Addendum)
Desiree [name]  77 y.o.  female  Subjective: Patient feels substantially improved as compared to yesterday in terms of fatigue and malaise; dyspnea is improved as well. She reports no chest discomfort.  Her major complaint is of neck discomfort that she attributes to excessive time in a supine position.  Allergy: Celebrex and Diovan  Objective: Vital signs in last 24 hours: Temp:  [98.6 F (37 C)-101.3 F (38.5 C)] 99.5 F (37.5 C) (11/18 0800) Pulse Rate:  [80-99] 87  (11/18 0800) Resp:  [15-26] 26  (11/18 0800) BP: (100-124)/(45-58) 124/57 mmHg (11/18 0800) SpO2:  [92 %-98 %] 96 % (11/18 0800) Weight:  [60.1 kg (132 lb 7.9 oz)] 132 lb 7.9 oz (60.1 kg) (11/18 0500)  132 lb 7.9 oz (60.1 kg) Body mass index is 26.76 kg/(m^2).  Weight change: 1.1 kg (2 lb 6.8 oz) Last BM Date: 01/10/11  Intake/Output from previous day: 11/17 0701 - 11/18 0700 In: 2064 [P.O.:1200; I.V.:210; IV Piggyback:654] Out: 1761 [Urine:1760; Stool:1]  General-Well developed; no acute distress; thin and mentally much brighter Neck-mild JVD Lungs:  Few basilar rales-improved Cardiovascular-normal PMI; normal S1 and S2; grade 3/6 holosystolic murmur at the left sternal border and apex Abdomen-normal bowel sounds; soft and non-tender without masses or organomegaly Skin-Warm, no significant lesions Extremities-markedly decreased distal pulses; trace pedal edema and presacral edema present  CBC:    Basename 01/11/11 0335 01/10/11 0330  WBC 5.3 7.8  HGB 11.1* 11.3*  HCT 33.0* 33.6*  PLT 204 227   BMET:   Basename 01/12/11 0320 01/11/11 1010  NA 134* 136  K 4.5 3.6  CL 100 97  CO2 26 26  GLUCOSE 91 171*  BUN 16 13  CREATININE 0.86 0.67  CALCIUM 9.1 9.2   Hepatic Function:    Basename 01/10/11 0330  PROT 6.3  ALBUMIN 2.7*  AST 29  ALT 48*  ALKPHOS 97  BILITOT 0.3  BILIDIR --  IBILI --   GFR:  Estimated Creatinine Clearance: 34.4 ml/min (by C-G formula based on Cr of 0.96).  EKG:  Normal  except for frequent PVCs.  Imaging: Imaging results have been reviewed  Medications: I have reviewed the patient's current medications.   Assessment/Plan: Congestive heart failure- echocardiogram located and reviewed. Substantial mitral regurgitation is verified, but is not well quantified. LV size is normal with LVH and hyperdynamic function. There is no clear structural abnormality of the valve, other than substantial mitral annular calcification, to account for the significant regurgitation.  Urine output has slowed; furosemide dose will be increased to 40 mg twice a day. BNP level has decreased from nearly 1000 to 400+.  Hypokalemia. Repeat serum potassium following replacement of 160 mEq of potassium is 3.6. Additional supplementation will be given and a daily dosing initiated.  Hypertension: Adequately controlled with current medication.  Anemia: Iron studies suggest iron deficiency, but ferritin is normal. Stool for Hemoccult testing negative 3 days ago. Further evaluation will be necessary, probably on an outpatient basis.  Pneumonia-not entirely clear that this was her presenting problem, but completion of a course of antibiotics is reasonable.  Long discussion with both daughters regarding patient's cardiac issues, possible additional testing and possible therapeutic interventions. Patient is not enthused with the prospect of complex medical care, cardiac surgery or percutaneous intervention. An initial trial of medical therapy may be the preferred approach. Dr. [name] will discuss this with family tomorrow.  LOS: 3 days   [name] 01/11/2011, 9:51 AM

## 2011-01-12 ENCOUNTER — Encounter (HOSPITAL_COMMUNITY): Payer: Self-pay | Admitting: Emergency Medicine

## 2011-01-12 ENCOUNTER — Inpatient Hospital Stay (HOSPITAL_COMMUNITY): Payer: 59

## 2011-01-12 DIAGNOSIS — J810 Acute pulmonary edema: Secondary | ICD-10-CM

## 2011-01-12 LAB — CBC
Platelets: 244 10*3/uL (ref 150–400)
RBC: 3.56 MIL/uL — ABNORMAL LOW (ref 3.87–5.11)
RDW: 13.1 % (ref 11.5–15.5)
WBC: 5.3 10*3/uL (ref 4.0–10.5)

## 2011-01-12 LAB — BASIC METABOLIC PANEL
Calcium: 9.1 mg/dL (ref 8.4–10.5)
Creatinine, Ser: 0.86 mg/dL (ref 0.50–1.10)
GFR calc Af Amer: 70 mL/min — ABNORMAL LOW (ref >90)
Sodium: 134 mEq/L — ABNORMAL LOW (ref 135–145)

## 2011-01-12 LAB — MAGNESIUM: Magnesium: 1.7 mg/dL (ref 1.5–2.5)

## 2011-01-12 MED ORDER — SODIUM CHLORIDE 0.9 % IJ SOLN
3.0000 mL | INTRAMUSCULAR | Status: DC | PRN
  Administered 2011-01-12: 3 mL via INTRAVENOUS

## 2011-01-12 MED ORDER — SODIUM CHLORIDE 0.9 % IJ SOLN
3.0000 mL | Freq: Two times a day (BID) | INTRAMUSCULAR | Status: DC
  Administered 2011-01-13 – 2011-01-15 (×4): 3 mL via INTRAVENOUS

## 2011-01-12 MED ORDER — SODIUM CHLORIDE 0.9 % IV SOLN
250.0000 mL | INTRAVENOUS | Status: DC
  Administered 2011-01-12: 250 mL via INTRAVENOUS

## 2011-01-12 MED ORDER — POTASSIUM CHLORIDE CRYS ER 20 MEQ PO TBCR
40.0000 meq | EXTENDED_RELEASE_TABLET | Freq: Once | ORAL | Status: AC
  Administered 2011-01-12: 40 meq via ORAL

## 2011-01-12 NOTE — Progress Notes (Signed)
Desiree [name]  77 y.o.  female  Subjective:  Patient seen and chart reviewed.  Had moderate mitral regurgitation earlier this year, but significant worsening recently with CHF.  Not SOB today.  No chest pain.  Allergy: Celebrex and Diovan  Objective: Vital signs in last 24 hours: Temp:  [99.1 F (37.3 C)-99.9 F (37.7 C)] 99.1 F (37.3 C) (11/19 1200) Pulse Rate:  [81-94] 84  (11/19 1200) Resp:  [17-24] 17  (11/19 1200) BP: (105-130)/(42-78) 115/47 mmHg (11/19 1200) SpO2:  [97 %-100 %] 99 % (11/19 1200) Weight:  [58.7 kg (129 lb 6.6 oz)] 129 lb 6.6 oz (58.7 kg) (11/19 0000)  129 lb 6.6 oz (58.7 kg) Body mass index is 26.14 kg/(m^2).  Weight change: -1.4 kg (-3 lb 1.4 oz) Last BM Date: 01/11/11  Intake/Output from previous day: 11/18 0701 - 11/19 0700 In: 1588 [I.V.:120; IV Piggyback:8] Out: 550 [Urine:550]  General-Well developed; no acute distress Neck-noJVD Lungs: clear Cardiovascular-normal PMI; normal S1 and S2; grade 3/6 holosystolic murmur at the left sternal border and apex Abdomen-normal bowel sounds; soft and non-tender without masses or organomegaly Skin-Warm, no significant lesions Extremities-markedly decreased distal pulses;no edema  CBC:    Basename 01/12/11 0320 01/11/11 1010  WBC 5.3 6.3  HGB 10.9* 11.2*  HCT 33.1* 32.8*  PLT 244 223   BMET:   Basename 01/12/11 0320 01/11/11 1010  NA 134* 136  K 4.5 3.6  CL 100 97  CO2 26 26  GLUCOSE 91 171*  BUN 16 13  CREATININE 0.86 0.67  CALCIUM 9.1 9.2   Hepatic Function:    Basename 01/10/11 0330  PROT 6.3  ALBUMIN 2.7*  AST 29  ALT 48*  ALKPHOS 97  BILITOT 0.3  BILIDIR --  IBILI --   GFR:  Estimated Creatinine Clearance: 38 ml/min (by C-G formula based on Cr of 0.86).  EKG:  Normal except for frequent PVCs.  Imaging: Imaging results have been reviewed  Medications: I have reviewed the patient's current medications.   Assessment/Plan: Congestive heart failure-  Suspect due to mitral  regurgitation.  This clinically has worsened fairly recently but was present last February. Weight down and BNP improved.  Hypertension: Adequately controlled with current medication.  Mitral regurgitation:   Long discussion with patient and daughter.  She would like to see what her options are.  She will need a TEE and if sig MR will need cath.  She is agreeable and will try to arrange.  Likely will need to stay at [facility] after it is done for further testing.     LOS: 4 days   TILLEY JR,W SPENCER 01/12/2011, 1:25 PM

## 2011-01-12 NOTE — Progress Notes (Signed)
  Follow up - Critical Care Medicine Note  Patient Details:    Desiree [name] is an 77 y.o. female. Admitted with acute resp failure.  Patient Description     Lines/tubes Location Start Stop  none      Culture Date Result  BCx2 11/16    Antibiotic Indication Start Stop  Rocephin  avelox vanco azithro Cap Cap Cap cap 11/15 11/18 11/15  11/15 11/17  11/17 11/17   GI Prophylaxis DVT Prophylaxis  PPI SCD   Protocols     Consultants  Cards LHC     Date Events      Subjective:    Overnight Issues:  Less dyspneic, c/o back pain, weak.  Bmet pending Objective:  Vital signs  Temp:  [99.1 F (37.3 C)-99.9 F (37.7 C)] 99.1 F (37.3 C) (11/19 1200) Pulse Rate:  [81-94] 87  (11/19 1357) Resp:  [17-24] 17  (11/19 1200) BP: (105-119)/(44-78) 119/44 mmHg (11/19 1357) SpO2:  [97 %-100 %] 100 % (11/19 1357) Weight:  [58.7 kg (129 lb 6.6 oz)] 129 lb 6.6 oz (58.7 kg) (11/19 0000)   Intake/Output Summary (Last 24 hours) at 01/12/11 1431 Last data filed at 01/12/11 1300  Gross per 24 hour  Intake   1248 ml  Output   1765 ml  Net   -517 ml    Physical Exam:  Gen: Pleasant, well-nourished, in no distress,  normal affect ENT: No lesions,  mouth clear,  oropharynx clear, Neck: No JVD, no TMG, no carotid bruits Lungs: No use of accessory muscles, no dullness to percussion, decreased BS at R base. Decreased rales Cardiovascular: RRR, heart sounds normal,loud systolic murmur Abdomen: soft and NT, no HSM,  BS normal Musculoskeletal: No deformities, no cyanosis or clubbing Neuro: alert, non focal Skin: Warm, no lesions or rashes   PCXR: improving right basilar aeration  LAB RESULTS  Lab 01/12/11 0320 01/11/11 1010 01/10/11 0330  NA 134* 136 134*  K 4.5 3.6 2.7*  CL 100 97 91*  CO2 26 26 33*  BUN 16 13 24*  CREATININE 0.86 0.67 0.96  GLUCOSE 91 171* 125*  ]  Lab 01/12/11 0320 01/11/11 1010 01/11/11 0335  HGB 10.9* 11.2* 11.1*  HCT 33.1* 32.8* 33.0*  WBC 5.3  6.3 5.3  PLT 244 223 204  } ABG   Additional lab data  Radiology  Assessment/Plan:  Principal Problem: 1) Acute respiratory failure with hypoxia in setting of decompensated heart failure with resultant pulmonary edema and pleural effusions. Keep dry, wean fio2 as needed  2) HTN (hypertension) Having hypertensive meds except for Lopressor at this time  3) diastolic congestive heart failure, further complicated by valvular disease.  Echo shows ejection fraction 75%.  Plan -Per cardiology -Continue beta blocker -plan for card cath  4) CAP (community acquired pneumonia). All cultures negative Plan -see dashboard  5) Anemia, mild  Lab 01/12/11 0320 01/11/11 1010 01/11/11 0335  HGB 10.9* 11.2* 11.1*  ] Plan: -Monitor CBC daily transfuse for hemoglobin less than 8    Sandrea Hughs, MD Pulmonary and Critical Care Medicine [facility] Cell [phone]

## 2011-01-12 NOTE — Progress Notes (Signed)
Occupational Therapy Evaluation Patient Details Name: Desiree [name] MRN: [id number] DOB: 03/11/1924 Today's Date: 01/12/2011 1325 1355 EV 2  Problem List:  Patient Active Problem List  Diagnoses  . HTN (hypertension)  . Nausea alone  . Diverticulitis  . Pleural effusion due to CHF (congestive heart failure)  . Acute respiratory failure with hypoxia  . Abdominal pain  . Acute diastolic congestive heart failure  . CAP (community acquired pneumonia)  . Hypotension  . Anemia, mild  . Mitral regurgitation  . Hypokalemia    Past Medical History:  Past Medical History  Diagnosis Date  . Diverticulitis     chronic issues with consitpation and diarrhea  . Hypertension     well controlled usually  . Hypothyroid   . Anemia   . Hypercholesterolemia   . Hypothyroidism   . Osteoarthritis     chronic-on multiple pain meds.  . Paroxysmal atrial fibrillation     not on coumadin-Cards=Tilley-saw him ?1 yr ago  . CHF (congestive heart failure), NYHA class II     has sudden onset decom CHF? 2/2 to PNA   Past Surgical History:  Past Surgical History  Procedure Date  . Carotid endarterectomy   . Carpal tunnel release   . Rotator cuff repair     2 on right, 1 on left  . Abdominal hysterectomy   . Cataract extraction   . Knee surgery     bilateral    OT Assessment/Plan/Recommendation OT Assessment Clinical Impression Statement: Pt would beneift from skilled OT to reach supervision level in acute OT Recommendation/Assessment: Patient will need skilled OT in the acute care venue OT Problem List: Decreased strength;Decreased activity tolerance;Impaired balance (sitting and/or standing);Decreased knowledge of use of DME or AE;Cardiopulmonary status limiting activity;Impaired UE functional use Barriers to Discharge: Decreased caregiver support OT Therapy Diagnosis : Generalized weakness OT Plan OT Frequency: Min 2X/week OT Treatment/Interventions: Self-care/ADL training;Energy  conservation;DME and/or AE instruction;Therapeutic activities;Patient/family education;Balance training OT Recommendation Follow Up Recommendations: Home health OT;Other (comment) (if patient progresses well enough to return [facility] with [facility] ) Equipment Recommended: None recommended by OT Individuals Consulted Consulted and Agree with Results and Recommendations: Patient OT Goals Acute Rehab OT Goals OT Goal Formulation: With patient Time For Goal Achievement: 2 weeks ADL Goals Pt Will Perform Grooming: with supervision;Standing at sink ADL Goal: Grooming - Progress: Progressing toward goals Pt Will Perform Lower Body Bathing: with supervision;Sit to stand from chair ADL Goal: Lower Body Bathing - Progress: Progressing toward goals Pt Will Perform Lower Body Dressing: with supervision;Sit to stand from bed ADL Goal: Lower Body Dressing - Progress: Progressing toward goals Pt Will Transfer to Toilet: with supervision;Ambulation;Comfort height toilet ADL Goal: Toilet Transfer - Progress: Progressing toward goals Pt Will Perform Toileting - Clothing Manipulation: with supervision;Standing ADL Goal: Toileting - Clothing Manipulation - Progress: Progressing toward goals Pt Will Perform Toileting - Hygiene: with supervision;Sit to stand from 3-in-1/toilet ADL Goal: Toileting - Hygiene - Progress: Progressing toward goals Pt Will Perform Tub/Shower Transfer: Shower transfer;with supervision;Ambulation;Shower seat with back ADL Goal: Web designer - Progress: Progressing toward goals Miscellaneous OT Goals Miscellaneous OT Goal #1: pt will state 2 energy conservation techniques OT Goal: Miscellaneous Goal #1 - Progress: Progressing toward goals  OT Evaluation Precautions/Restrictions  Restrictions Weight Bearing Restrictions: No Prior Functioning Home Living Lives With: Alone Type of Home: House Home Layout: One level Home Access: Stairs to enter [employer] of Steps: has  1-2 steps in front and couple in back; rails both entrances  Bathroom Shower/Tub: Health visitor: Standard Additional Comments: has elevated toilet seat; has shower seat but doesn't use; brother just died and she can use his walker (if it goes low enough) Prior Function Driving: Yes 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) ADL ADL Grooming: Set up;Simulated Where Assessed - Grooming: Sitting, chair;Supported Upper Body Bathing: Simulated;Set up Where Assessed - Upper Body Bathing: Sitting, chair;Supported Lower Body Bathing: Simulated;Minimal assistance Where Assessed - Lower Body Bathing: Sit to stand from chair Upper Body Dressing: Simulated;Set up;Other (comment) (secondary to lines) Where Assessed - Upper Body Dressing: Sitting, chair;Supported Lower Body Dressing: Minimal assistance;Simulated Where Assessed - Lower Body Dressing: Sitting, chair;Supported Statistician: Mining engineer Method: Proofreader: Other (comment) Nurse, children's) Toileting - Clothing Manipulation: Simulated;Minimal assistance Where Assessed - Glass blower/designer Manipulation: Standing Toileting - Hygiene: Simulated;Minimal assistance Where Assessed - Toileting Hygiene: Standing Equipment Used: Other (comment) (hand held assist; +1 for lines) ADL Comments: pt has bil rotator cuff problems; has difficulty sustaining arms to top of head but can reach 90 degrees.  works around back pain  co tx with PT Vision/Perception  Vision - History Baseline Vision: No visual deficits Patient Visual Report: No change from baseline Cognition Cognition Overall Cognitive Status: Appears within functional limits for tasks assessed Sensation/Coordination Sensation Light Touch: Not tested (complains of numbness especially RUE; doesn't drop things) Extremity Assessment RUE Assessment RUE Assessment: Exceptions to [facility] (can lift bil UEs to 90  degrees; problems with rotator cuffs) LUE Assessment LUE Assessment: Exceptions to [facility] (see above) Mobility  Transfers Transfers: Yes Sit to Stand: 4: Min assist;Other (comment) (min guard) Exercises   End of Session OT - End of Session Equipment Utilized During Treatment: Gait belt Activity Tolerance: Patient limited by pain;Patient limited by fatigue Patient left: in bed;with restraints reapplied;with call bell in reach General Behavior During Session: [facility] for tasks performed Cognition: [facility] for tasks performed   [place] Code [phone] 01/12/2011, 2:12 PM

## 2011-01-12 NOTE — Progress Notes (Addendum)
Physical Therapy Evaluation Patient Details Name: RONEISHA STERN MRN: [id number] DOB: 1924-08-15 Today's Date: 01/12/2011 1325-1355 Ev2  Problem List:  Patient Active Problem List  Diagnoses  . HTN (hypertension)  . Nausea alone  . Diverticulitis  . Pleural effusion due to CHF (congestive heart failure)  . Acute respiratory failure with hypoxia  . Abdominal pain  . Acute diastolic congestive heart failure  . CAP (community acquired pneumonia)  . Hypotension  . Anemia, mild  . Mitral regurgitation  . Hypokalemia    Past Medical History:  Past Medical History  Diagnosis Date  . Diverticulitis     chronic issues with consitpation and diarrhea  . Hypertension     well controlled usually  . Hypothyroid   . Anemia   . Hypercholesterolemia   . Hypothyroidism   . Osteoarthritis     chronic-on multiple pain meds.  . Paroxysmal atrial fibrillation     not on coumadin-Cards=[facility]-saw him ?1 yr ago  . CHF (congestive heart failure), NYHA class II     has sudden onset decom CHF? 2/2 to PNA   Past Surgical History:  Past Surgical History  Procedure Date  . Carotid endarterectomy   . Carpal tunnel release   . Rotator cuff repair     2 on right, 1 on left  . Abdominal hysterectomy   . Cataract extraction   . Knee surgery     bilateral    PT Assessment/Plan/Recommendation PT Assessment Clinical Impression Statement: Patient admitted with acute respiratory failure with hypoxia and presents with decreased independence with mobility and decreased activity tolerance and will benefit from skilled PT in the acute care setting to maximize independence to allow d/c home with HHPT. PT Recommendation/Assessment: Patient will need skilled PT in the acute care venue PT Problem List: Decreased strength;Decreased activity tolerance;Decreased balance;Decreased mobility;Decreased knowledge of use of DME Barriers to Discharge: Decreased caregiver support Barriers to Discharge Comments:  daughters will be here through the weekend, then will have no caregiver support PT Therapy Diagnosis : Generalized weakness;Abnormality of gait PT Plan PT Frequency: Min 3X/week PT Treatment/Interventions: Gait training;Stair training;DME instruction;Patient/family education;Functional mobility training;Therapeutic exercise;Balance training PT Recommendation Follow Up Recommendations: Home health PT Equipment Recommended:  (to be assessed) PT Goals  Acute Rehab PT Goals PT Goal Formulation: With patient Time For Goal Achievement: 2 weeks Pt will go Supine/Side to Sit: with modified independence PT Goal: Supine/Side to Sit - Progress:  (not addressed on eval) Pt will go Sit to Supine/Side: with modified independence PT Goal: Sit to Supine/Side - Progress:  (not addressed on evel) Pt will Transfer Sit to Stand/Stand to Sit: with modified independence PT Transfer Goal: Sit to Stand/Stand to Sit - Progress: Progressing toward goal Pt will Stand: with modified independence;3 - 5 min;with unilateral upper extremity support (while performing functional task) PT Goal: Stand - Progress: Progressing toward goal Pt will Ambulate: >150 feet;with modified independence;with least restrictive assistive device PT Goal: Ambulate - Progress: Progressing toward goal  PT Evaluation Precautions/Restrictions    Prior Functioning  Home Living Lives With: Alone Type of Home: House Home Layout: One level Home Access: Stairs to enter Entrance Stairs-Rails: Right Entrance Stairs-Number of Steps: 2 Bathroom Shower/Tub: Health visitor: Standard Additional Comments: has elevated toilet seat; has shower seat but doesn't use; brother just died and she can use his walker (if it goes low enough) Prior Function Level of Independence: Independent with basic ADLs;Independent with homemaking with ambulation;Independent with gait;Independent with transfers Driving: Yes 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: Awake/alert Overall Cognitive Status: Appears within functional limits for tasks assessed Sensation/Coordination Sensation Light Touch: Impaired by gross assessment (both LE's numb at times) Extremity Assessment RUE Assessment RUE Assessment: Exceptions to [facility] (can lift bil UEs to 90 degrees; problems with rotator cuffs) LUE Assessment LUE Assessment: Exceptions to [facility] (see above) RLE Assessment RLE Assessment: Within Functional Limits LLE Assessment LLE Assessment: Within Functional Limits Mobility (including Balance) Transfers Sit to Stand: 4: Min assist Sit to Stand Details (indicate cue type and reason): from recliner with use of UE assist Stand to Sit: 4: Min assist Stand to Sit Details: min assist to recliner with assist for multiple lines Ambulation/Gait Ambulation/Gait Assistance: 4: Min assist Ambulation Distance (Feet): 200 Feet Assistive device: 1 person hand held assist Gait Pattern: Shuffle;Decreased stride length    Exercise    End of Session PT - End of Session Equipment Utilized During Treatment: Gait belt Activity Tolerance: Patient limited by fatigue Patient left: in chair;with family/visitor present General Behavior During Session: [facility] for tasks performed Cognition: [facility] for tasks performed Patient seen with occupational therapy for evaluation today.  WYNN,CYNDI 01/12/2011, 4:08 PM

## 2011-01-13 ENCOUNTER — Encounter (HOSPITAL_COMMUNITY): Payer: Self-pay | Admitting: *Deleted

## 2011-01-13 ENCOUNTER — Encounter (HOSPITAL_COMMUNITY)
Admission: EM | Disposition: A | Discharge status: Home / Self Care | Payer: Self-pay | Source: Home / Self Care | Attending: Critical Care Medicine

## 2011-01-13 DIAGNOSIS — J13 Pneumonia due to Streptococcus pneumoniae: Secondary | ICD-10-CM

## 2011-01-13 DIAGNOSIS — J9600 Acute respiratory failure, unspecified whether with hypoxia or hypercapnia: Secondary | ICD-10-CM

## 2011-01-13 DIAGNOSIS — J90 Pleural effusion, not elsewhere classified: Secondary | ICD-10-CM

## 2011-01-13 DIAGNOSIS — I059 Rheumatic mitral valve disease, unspecified: Secondary | ICD-10-CM

## 2011-01-13 DIAGNOSIS — J810 Acute pulmonary edema: Secondary | ICD-10-CM

## 2011-01-13 DIAGNOSIS — M519 Unspecified thoracic, thoracolumbar and lumbosacral intervertebral disc disorder: Secondary | ICD-10-CM | POA: Insufficient documentation

## 2011-01-13 HISTORY — PX: TEE WITHOUT CARDIOVERSION: SHX5443

## 2011-01-13 LAB — BASIC METABOLIC PANEL
BUN: 19 mg/dL (ref 6–23)
Chloride: 100 mEq/L (ref 96–112)
Creatinine, Ser: 0.85 mg/dL (ref 0.50–1.10)
GFR calc Af Amer: 71 mL/min — ABNORMAL LOW (ref >90)
Glucose, Bld: 104 mg/dL — ABNORMAL HIGH (ref 70–99)

## 2011-01-13 SURGERY — ECHOCARDIOGRAM, TRANSESOPHAGEAL
Anesthesia: Moderate Sedation | Laterality: Left

## 2011-01-13 MED ORDER — MIDAZOLAM HCL 10 MG/2ML IJ SOLN
INTRAMUSCULAR | Status: DC | PRN
  Administered 2011-01-13 (×2): 2 mg via INTRAVENOUS

## 2011-01-13 MED ORDER — DIAZEPAM 5 MG PO TABS
5.0000 mg | ORAL_TABLET | ORAL | Status: AC
  Administered 2011-01-14: 5 mg via ORAL
  Filled 2011-01-13: qty 1

## 2011-01-13 MED ORDER — FUROSEMIDE 40 MG PO TABS
40.0000 mg | ORAL_TABLET | Freq: Every day | ORAL | Status: DC
  Administered 2011-01-13 – 2011-01-15 (×3): 40 mg via ORAL
  Filled 2011-01-13 (×3): qty 1

## 2011-01-13 MED ORDER — SODIUM CHLORIDE 0.9 % IV SOLN: 250.0000 mL | INTRAVENOUS | Status: DC

## 2011-01-13 MED ORDER — ASPIRIN 81 MG PO CHEW
324.0000 mg | CHEWABLE_TABLET | ORAL | Status: AC
  Administered 2011-01-14: 324 mg via ORAL
  Filled 2011-01-13: qty 4
  Filled 2011-01-13: qty 1

## 2011-01-13 MED ORDER — MIDAZOLAM HCL 10 MG/2ML IJ SOLN
INTRAMUSCULAR | Status: AC
  Filled 2011-01-13: qty 2

## 2011-01-13 MED ORDER — MOXIFLOXACIN HCL 400 MG PO TABS
400.0000 mg | ORAL_TABLET | Freq: Every day | ORAL | Status: DC
  Administered 2011-01-13 – 2011-01-14 (×2): 400 mg via ORAL
  Filled 2011-01-13 (×3): qty 1

## 2011-01-13 MED ORDER — BUTAMBEN-TETRACAINE-BENZOCAINE 2-2-14 % EX AERO
INHALATION_SPRAY | CUTANEOUS | Status: DC | PRN
  Administered 2011-01-13: 2 via TOPICAL

## 2011-01-13 MED ORDER — FENTANYL CITRATE 0.05 MG/ML IJ SOLN
INTRAMUSCULAR | Status: AC
  Filled 2011-01-13: qty 2

## 2011-01-13 MED ORDER — SODIUM CHLORIDE 0.9 % IV SOLN
INTRAVENOUS | Status: DC
  Administered 2011-01-14: 13:00:00 via INTRAVENOUS

## 2011-01-13 MED ORDER — SODIUM CHLORIDE 0.45 % IV SOLN: INTRAVENOUS | Status: DC

## 2011-01-13 MED ORDER — DIAZEPAM 5 MG PO TABS: 5.0000 mg | ORAL_TABLET | ORAL | Status: DC

## 2011-01-13 NOTE — Progress Notes (Signed)
  Follow up - Critical Care Medicine Note  Patient Details:    Desiree [name] is an 77 y.o. female. Admitted with acute resp failure in setting of PNA and  Decompensated diastolic heart failure.      Culture Date Result  BCx2 11/16    Antibiotic Indication Start Stop  Rocephin  avelox vanco azithro Cap Cap Cap cap 11/15 11/18 11/15  11/15 11/17  11/17 11/17   GI Prophylaxis DVT Prophylaxis  PPI SCD   Protocols     Consultants  Cards LHC     Date Events  11/20 TEE>>>   Subjective:    Overnight Issues:  Feels better. Awaiting ECHO later today Objective:  Vital signs  Temp:  [99.1 F (37.3 C)-99.9 F (37.7 C)] 99.9 F (37.7 C) (11/20 0800) Pulse Rate:  [82-96] 91  (11/20 0912) Resp:  [17-28] 28  (11/20 0800) BP: (107-125)/(30-57) 113/30 mmHg (11/20 0912) SpO2:  [93 %-100 %] 97 % (11/20 0800) Weight:  [57.4 kg (126 lb 8.7 oz)] 126 lb 8.7 oz (57.4 kg) (11/20 0000)   Intake/Output Summary (Last 24 hours) at 01/13/11 1010 Last data filed at 01/13/11 0800  Gross per 24 hour  Intake    473 ml  Output   1700 ml  Net  -1227 ml    Physical Exam:  Gen: Pleasant, well-nourished, in no distress,  normal affect ENT: No lesions,  mouth clear,  oropharynx clear, Neck: No JVD, no TMG, no carotid bruits Lungs: No use of accessory muscles, no dullness to percussion, decreased both bases.  Cardiovascular: RRR, heart sounds normal,loud systolic murmur Abdomen: soft and NT, no HSM,  BS normal Musculoskeletal: No deformities, no cyanosis or clubbing Neuro: alert, non focal Skin: Warm, no lesions or rashes   PCXR: 11/19 improving right basilar aeration  LAB RESULTS  Lab 01/13/11 0313 01/12/11 0320 01/11/11 1010  NA 136 134* 136  K 3.9 4.5 3.6  CL 100 100 97  CO2 26 26 26   BUN 19 16 13   CREATININE 0.85 0.86 0.67  GLUCOSE 104* 91 171*  ]  Lab 01/12/11 0320 01/11/11 1010 01/11/11 0335  HGB 10.9* 11.2* 11.1*  HCT 33.1* 32.8* 33.0*  WBC 5.3 6.3 5.3  PLT 244  223 204  } ABG   Additional lab data  Radiology  Assessment/Plan:  Principal Problem: 1) Acute respiratory failure with hypoxia in setting of decompensated heart failure with resultant pulmonary edema and pleural effusions. -Keep dry, wean fio2 as needed  2) HTN (hypertension) -per cards    3) diastolic congestive heart failure, further complicated by valvular disease.  Echo shows ejection fraction 75%.  Plan -Per cardiology -Continue beta blocker -plan for TEE today  4) CAP (community acquired pneumonia). All cultures negative Plan -see dashboard/ will stop after 10 d total rx  5) Anemia, mild  Lab 01/12/11 0320 01/11/11 1010 01/11/11 0335  HGB 10.9* 11.2* 11.1*  ] Plan: -Monitor CBC daily transfuse for hemoglobin less than 8    Sandrea Hughs, MD Pulmonary and Critical Care Medicine [facility] Cell [phone]

## 2011-01-13 NOTE — Brief Op Note (Signed)
See full procedure note Charlton Haws 1:49 PM 01/13/2011

## 2011-01-13 NOTE — Procedures (Signed)
Transesophageal Echocardiogram: Indication: MR Sedation: Versed: 4, Fentanyl: , Other:  ASA: 3, Airway: 2  Procedure:  The patient was moderately sedated with the above doses of versed and fentanyl.  Using digital technique an omniplane probe was advanced into the distal esophagus without incident. Transgastric imaging revealed normal LV function with no RWMA;s and no mural apical thrombus..  Estimated ejection fraction was 65%.  Right sided cardiac chambers were normal with no evidence of pulmonary hypertension.  The pulmonary and tricuspid valves were structurally normal.  There was mld TR The mitral valve had a flail segment to the posterior leafelt.  There was severe eccentric MR directed anteriorly.  The PISA velocity was .9 The aortic valve was trileaflet with no significant regurgitations and mild calcification The aortic root was normal.    Imaging of the septum showed no ASD or VSD  The LAE was well visualized in orthogonal views.  There was no spontaneous contrast and no thrombus.    The descending thoracic aorta had mild  mural aortic debris with no evidence of aneurysmal dilation or disection  Impression:  1) Severe eccentric MR directed anteriorly with flail segment to the posterior leaflet 2) Mild AV calcification 3) Normal EF 65% 4) Normal RA/RV 5) Mild TR 6) Normal PV 7) Mild mural aortic debris 8) No LAA clot  Findings discussed with Dr [name] patients primary cardiologist  Desiree [name] 01/13/2011 2:15 PM

## 2011-01-13 NOTE — H&P (View-Only) (Signed)
  Follow up - Critical Care Medicine Note  Patient Details:    Desiree [name] is an 77 y.o. female. Admitted with acute resp failure.  Patient Description     Lines/tubes Location Start Stop  none      Culture Date Result  BCx2 11/16    Antibiotic Indication Start Stop  Rocephin  avelox vanco azithro Cap Cap Cap cap 11/15 11/18 11/15 11/15 11/17  11/17 11/17   GI Prophylaxis DVT Prophylaxis  PPI SCD   Protocols     Consultants  Cards LHC     Date Events      Subjective:    Overnight Issues:  Less dyspneic, c/o back pain, weak.  Bmet pending Objective:  Vital signs  Temp:  [99.1 F (37.3 C)-99.9 F (37.7 C)] 99.1 F (37.3 C) (11/19 1200) Pulse Rate:  [81-94] 87  (11/19 1357) Resp:  [17-24] 17  (11/19 1200) BP: (105-119)/(44-78) 119/44 mmHg (11/19 1357) SpO2:  [97 %-100 %] 100 % (11/19 1357) Weight:  [58.7 kg (129 lb 6.6 oz)] 129 lb 6.6 oz (58.7 kg) (11/19 0000)   Intake/Output Summary (Last 24 hours) at 01/12/11 1431 Last data filed at 01/12/11 1300  Gross per 24 hour  Intake   1248 ml  Output   1765 ml  Net   -517 ml    Physical Exam:  Gen: Pleasant, well-nourished, in no distress,  normal affect ENT: No lesions,  mouth clear,  oropharynx clear, Neck: No JVD, no TMG, no carotid bruits Lungs: No use of accessory muscles, no dullness to percussion, decreased BS at R base. Decreased rales Cardiovascular: RRR, heart sounds normal,loud systolic murmur Abdomen: soft and NT, no HSM,  BS normal Musculoskeletal: No deformities, no cyanosis or clubbing Neuro: alert, non focal Skin: Warm, no lesions or rashes   PCXR: improving right basilar aeration  LAB RESULTS  Lab 01/12/11 0320 01/11/11 1010 01/10/11 0330  NA 134* 136 134*  K 4.5 3.6 2.7*  CL 100 97 91*  CO2 26 26 33*  BUN 16 13 24*  CREATININE 0.86 0.67 0.96  GLUCOSE 91 171* 125*  ]  Lab 01/12/11 0320 01/11/11 1010 01/11/11 0335  HGB 10.9* 11.2* 11.1*  HCT 33.1* 32.8* 33.0*  WBC 5.3  6.3 5.3  PLT 244 223 204  } ABG   Additional lab data  Radiology  Assessment/Plan:  Principal Problem: 1) Acute respiratory failure with hypoxia in setting of decompensated heart failure with resultant pulmonary edema and pleural effusions. Keep dry, wean fio2 as needed  2) HTN (hypertension) Having hypertensive meds except for Lopressor at this time  3) diastolic congestive heart failure, further complicated by valvular disease.  Echo shows ejection fraction 75%.  Plan -Per cardiology -Continue beta blocker -plan for card cath  4) CAP (community acquired pneumonia). All cultures negative Plan -see dashboard  5) Anemia, mild  Lab 01/12/11 0320 01/11/11 1010 01/11/11 0335  HGB 10.9* 11.2* 11.1*  ] Plan: -Monitor CBC daily transfuse for hemoglobin less than 8    Prescilla Monger, MD Pulmonary and Critical Care Medicine [facility] Cell [phone]       

## 2011-01-13 NOTE — Op Note (Signed)
See full procedure note.  Flail segment of posterior mitral leaflet with severe MR

## 2011-01-13 NOTE — Progress Notes (Addendum)
Desiree [name]  77 y.o.  female  Subjective:  Patient was moved to [facility] and had a TEE earlier today that showed a partially flail mitral valve. She has severe mitral regurgitation and likely has had acute systolic and diastolic heart failure due to this mitral regurgitation. She is not currently short of breath. She denies PND orthopnea and has had a good diuresis at the present time.  Allergy: Celebrex and Diovan  Objective: Vital signs in last 24 hours: Temp:  [97 F (36.1 C)-99.9 F (37.7 C)] 97.6 F (36.4 C) (11/20 1527) Pulse Rate:  [77-96] 84  (11/20 1527) Resp:  [12-51] 20  (11/20 1527) BP: (91-161)/(30-86) 118/70 mmHg (11/20 1527) SpO2:  [93 %-100 %] 98 % (11/20 1527) Weight:  [56.79 kg (125 lb 3.2 oz)-57.4 kg (126 lb 8.7 oz)] 125 lb 3.2 oz (56.79 kg) (11/20 1527)  125 lb 3.2 oz (56.79 kg) Body mass index is 22.18 kg/(m^2).  Weight change: -1.3 kg (-2 lb 13.9 oz) Last BM Date: 01/13/11  Intake/Output from previous day: 11/19 0701 - 11/20 0700 In: 737 [P.O.:480; I.V.:253; IV Piggyback:4] Out: 2200 [Urine:2200]  General-Well developed; no acute distress Neck-noJVD Lungs: clear Cardiovascular-normal PMI; normal S1 and S2; grade 3/6 holosystolic murmur at the left sternal border and apex Abdomen-normal bowel sounds; soft and non-tender without masses or organomegaly Skin-Warm, no significant lesions Extremities-2+ peripheral pulses, no edema  CBC:    Basename 01/12/11 0320 01/11/11 1010  WBC 5.3 6.3  HGB 10.9* 11.2*  HCT 33.1* 32.8*  PLT 244 223   BMET:   Basename 01/13/11 0313 01/12/11 0320  NA 136 134*  K 3.9 4.5  CL 100 100  CO2 26 26  GLUCOSE 104* 91  BUN 19 16  CREATININE 0.85 0.86  CALCIUM 9.4 9.1   Hepatic Function:    GFR:  Estimated Creatinine Clearance: 40.8 ml/min (by C-G formula based on Cr of 0.85).    Imaging: Echo results reviewed with Dr. Eden Emms.  Medications: I have reviewed the patient's current medications.     Assessment/Plan: Congestive heart failure-  Suspect due to mitral regurgitation.  This clinically has worsened fairly recently but was present last February. Weight down and BNP improved.  Hypertension: Adequately controlled with current medication.  Mitral regurgitation: Shows a partially flail mitral valve leaflet on echo which I suspect is recent by the onset of symptoms. Very difficult to tell she actually had pneumonia or whether this was all acute heart failure.   IMPRESSIONS:  Long discussion with patient and daughters.  Although she is 78, she was working as a Marine scientist prior to admission and her only major comorbidity at this time is significant low back pain.  I think we should proceed with a cardiac catheterization to evaluate her heart pressures and to determine if she has any coronary artery disease. We then could obtain a surgical consultation to determine if mitral valve repair is an option or whether she should be referred for consideration of mitral clipping.   Cardiac catheterization was discussed with the patient fully including risks of myocardial infarction, death, stroke, bleeding, arrhythmia, dye allergy, renal insufficiency or bleeding.  The patient understands and is willing to proceed. Plan to do catheterization tomorrow morning.    LOS: 5 days   Corry Ihnen JR,W SPENCER 01/13/2011, 6:19 PM     Patient seen and examined.  No interval change in history and exam since yesterday.  Stable for procedure.  Darden Palmer MD [facility]  01/14/2011      

## 2011-01-13 NOTE — Progress Notes (Signed)
*  PRELIMINARY RESULTS* Echocardiogram Echocardiogram Transesophageal has been performed.  Glean Salen [facility] RDCS 01/13/2011, 3:06 PM

## 2011-01-13 NOTE — Interval H&P Note (Signed)
History and Physical Interval Note:   01/13/2011   1:50 PM   Desiree [name]  has presented today for surgery, with the diagnosis of Mitral regurgitation  The various methods of treatment have been discussed with the patient and family. After consideration of risks, benefits and other options for treatment, the patient has consented to  Procedure(s): TRANSESOPHAGEAL ECHOCARDIOGRAM (TEE) as a surgical intervention .  The patients' history has been reviewed, patient examined, no change in status, stable for surgery.  I have reviewed the patients' chart and labs.  Questions were answered to the patient's satisfaction.  Also discussed with daughters including risk of sedation.  Dr [name] has arranged for her to stay at [facility] post procedure   Charlton Haws  MD 1:50 PM 01/13/2011

## 2011-01-14 ENCOUNTER — Encounter (HOSPITAL_COMMUNITY)
Admission: EM | Disposition: A | Discharge status: Home / Self Care | Payer: Self-pay | Source: Home / Self Care | Attending: Critical Care Medicine

## 2011-01-14 ENCOUNTER — Encounter (HOSPITAL_COMMUNITY): Payer: Self-pay

## 2011-01-14 ENCOUNTER — Encounter (HOSPITAL_COMMUNITY): Payer: Self-pay | Admitting: Cardiology

## 2011-01-14 ENCOUNTER — Other Ambulatory Visit: Payer: Self-pay

## 2011-01-14 DIAGNOSIS — I059 Rheumatic mitral valve disease, unspecified: Secondary | ICD-10-CM

## 2011-01-14 HISTORY — PX: CARDIAC CATHETERIZATION: SHX172

## 2011-01-14 HISTORY — PX: LEFT AND RIGHT HEART CATHETERIZATION WITH CORONARY ANGIOGRAM: SHX5449

## 2011-01-14 LAB — BASIC METABOLIC PANEL
BUN: 21 mg/dL (ref 6–23)
CO2: 27 mEq/L (ref 19–32)
Calcium: 9.2 mg/dL (ref 8.4–10.5)
Creatinine, Ser: 0.84 mg/dL (ref 0.50–1.10)
Glucose, Bld: 93 mg/dL (ref 70–99)

## 2011-01-14 LAB — POCT I-STAT 3, VENOUS BLOOD GAS (G3P V)
Bicarbonate: 26.7 mEq/L — ABNORMAL HIGH (ref 20.0–24.0)
TCO2: 28 mmol/L (ref 0–100)
pCO2, Ven: 41.3 mmHg — ABNORMAL LOW (ref 45.0–50.0)
pH, Ven: 7.418 — ABNORMAL HIGH (ref 7.250–7.300)

## 2011-01-14 LAB — POCT I-STAT 3, ART BLOOD GAS (G3+)
Acid-Base Excess: 4 mmol/L — ABNORMAL HIGH (ref 0.0–2.0)
O2 Saturation: 94 %
TCO2: 29 mmol/L (ref 0–100)
pO2, Arterial: 67 mmHg — ABNORMAL LOW (ref 80.0–100.0)

## 2011-01-14 LAB — PROTIME-INR
INR: 1.19 (ref 0.00–1.49)
Prothrombin Time: 15.4 seconds — ABNORMAL HIGH (ref 11.6–15.2)

## 2011-01-14 SURGERY — LEFT AND RIGHT HEART CATHETERIZATION WITH CORONARY ANGIOGRAM
Anesthesia: LOCAL

## 2011-01-14 MED ORDER — SODIUM CHLORIDE 0.9 % IJ SOLN: 3.0000 mL | Freq: Two times a day (BID) | INTRAMUSCULAR | Status: DC

## 2011-01-14 MED ORDER — ACETAMINOPHEN 325 MG PO TABS: 650.0000 mg | ORAL_TABLET | ORAL | Status: DC | PRN

## 2011-01-14 MED ORDER — NITROGLYCERIN 0.2 MG/ML ON CALL CATH LAB
INTRAVENOUS | Status: AC
  Filled 2011-01-14: qty 1

## 2011-01-14 MED ORDER — MIDAZOLAM HCL 10 MG/2ML IJ SOLN: 10.0000 mg | Freq: Once | INTRAMUSCULAR | Status: DC

## 2011-01-14 MED ORDER — BENZOCAINE 20 % MT SOLN: 1.0000 "application " | OROMUCOSAL | Status: DC | PRN

## 2011-01-14 MED ORDER — SODIUM CHLORIDE 0.9 % IJ SOLN: 3.0000 mL | INTRAMUSCULAR | Status: DC | PRN

## 2011-01-14 MED ORDER — ENOXAPARIN SODIUM 40 MG/0.4ML ~~LOC~~ SOLN
40.0000 mg | SUBCUTANEOUS | Status: DC
  Administered 2011-01-15: 40 mg via SUBCUTANEOUS
  Filled 2011-01-14: qty 0.4

## 2011-01-14 MED ORDER — SODIUM CHLORIDE 0.9 % IV SOLN: INTRAVENOUS | Status: AC

## 2011-01-14 MED ORDER — AMIODARONE HCL 200 MG PO TABS
200.0000 mg | ORAL_TABLET | Freq: Two times a day (BID) | ORAL | Status: DC
  Administered 2011-01-14 – 2011-01-15 (×2): 200 mg via ORAL
  Filled 2011-01-14 (×4): qty 1

## 2011-01-14 MED ORDER — HEPARIN (PORCINE) IN NACL 2-0.9 UNIT/ML-% IJ SOLN
INTRAMUSCULAR | Status: AC
  Filled 2011-01-14: qty 2000

## 2011-01-14 MED ORDER — LIDOCAINE HCL (PF) 1 % IJ SOLN
INTRAMUSCULAR | Status: AC
  Filled 2011-01-14: qty 30

## 2011-01-14 MED ORDER — FENTANYL CITRATE 0.05 MG/ML IJ SOLN: 250.0000 ug | Freq: Once | INTRAMUSCULAR | Status: DC

## 2011-01-14 NOTE — Progress Notes (Signed)
CARDIOTHORACIC SURGERY CONSULTATION REPORT  PCP is ALM,STEPHANIE, MD Attending physician is @ATTENDING @ Referring Provider is TILLEY,W.SPENCER,MD   Reason for consultation:  Severe Mitral Regurgitation  HPI:  Patient is an 77 year old female from [place] with history of mitral valve prolapse followed by Dr. [name].  She also has hypertension and chronic back pain due to osteoporosis with degenerative arthritis afflicting her spine. The patient has remained remarkably physically active and completely functionally independent. 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 The patient has been under considerable stress recently do to the illness and ultimate loss of her brother who passed away recently. She has developed exertional shortness of breath relatively acutely, and was admitted to the hospital on 01/08/2011 with class IV congestive heart failure. 2-D echocardiogram performed 01/09/2011 revealed mitral regurgitation with normal left ventricular systolic function and mild left ventricular hypertrophy.  The patient's symptoms improved with diuretic therapy. Transesophageal echocardiogram was performed 01/13/2011 demonstrating mitral valve prolapse with flail segment of the posterior leaflet of the mitral valve and severe (4+) mitral regurgitation. Cardiothoracic surgical consultation has been requested to consider surgical intervention.  Past Medical History  Diagnosis Date  . Diverticulitis     chronic issues with consitpation and diarrhea  . Hypertension     well controlled usually  . Hypothyroid   . Anemia   . Hypercholesterolemia   . Hypothyroidism   . Osteoarthritis     chronic-on multiple pain meds.  . Paroxysmal atrial fibrillation     not on coumadin-Cards=Tilley-saw him ?1 yr ago  . CHF (congestive heart failure), NYHA class II     has sudden onset decom CHF? 2/2 to PNA  . Lumbar disc disease       Past Surgical History  Procedure Date  . Carotid endarterectomy   . Carpal tunnel release   . Rotator cuff repair     2 on right, 1 on left  . Abdominal hysterectomy   . Cataract extraction   . Knee surgery     bilateral    History reviewed. No pertinent family history.  Social History History  Substance Use Topics  . Smoking status: Never Smoker   . Smokeless tobacco: Never Used  . Alcohol Use: No    Current Facility-Administered Medications  Medication Dose Route Frequency Provider Last Rate Last Dose  . 0.9 %  sodium chloride infusion   Intravenous Continuous W Ashley Royalty., MD      . acetaminophen (TYLENOL) tablet 650 mg  650 mg Oral Q6H PRN Lonia Farber, MD   650 mg at 01/10/11 1715  . aspirin chewable tablet 324 mg  324 mg Oral Pre-Cath W Ashley Royalty., MD   324 mg at 01/14/11 0559  . diazepam (VALIUM) tablet 5 mg  5 mg Oral On Call W Ashley Royalty., MD      . furosemide (LASIX) tablet 40 mg  40 mg Oral Daily W Ashley Royalty., MD   40 mg at 01/14/11 0954  . HYDROcodone-acetaminophen (NORCO) 5-325 MG per tablet 1 tablet  1 tablet Oral Q6H PRN Pleas Koch, MD   1 tablet at 01/14/11 0559  . levothyroxine (SYNTHROID, LEVOTHROID) tablet 75 mcg  75 mcg Oral Daily Pleas Koch, MD   75 mcg at 01/14/11 0954  . metoprolol (LOPRESSOR) tablet 50 mg  50 mg Oral BID Pleas Koch, MD   50 mg at 01/14/11  1610  . moxifloxacin (AVELOX) tablet 400 mg  400 mg Oral q1800 Anders Simmonds, NP   400 mg at 01/13/11 1822  . pantoprazole (PROTONIX) EC tablet 40 mg  40 mg Oral Q1200 Shan Levans, MD   40 mg at 01/13/11 0912  . sodium chloride 0.9 % injection 3 mL  3 mL Intravenous Q12H W Ashley Royalty., MD   3 mL at 01/14/11 0954  . spironolactone (ALDACTONE) tablet 25 mg  25 mg Oral Daily Gerrit Friends. Rothbart, MD   25 mg at 01/14/11 0954  . DISCONTD: 0.45 % sodium chloride infusion   Intravenous Continuous W Ashley Royalty., MD 10 mL/hr at 01/13/11 [phone]    .  DISCONTD: 0.9 %  sodium chloride infusion  250 mL Intravenous PRN Pleas Koch, MD      . DISCONTD: 0.9 %  sodium chloride infusion  250 mL Intravenous Continuous W Ashley Royalty., MD      . DISCONTD: 0.9 %  sodium chloride infusion  250 mL Intravenous Continuous W Ashley Royalty., MD 1 mL/hr at 01/12/11 1710 250 mL at 01/12/11 1710  . DISCONTD: aspirin EC tablet 81 mg  81 mg Oral Daily Pleas Koch, MD   81 mg at 01/13/11 0911  . DISCONTD: benzocaine (HURRICAINE) 20 % mouth spray 1 application  1 application Mouth/Throat PRN W Ashley Royalty., MD      . DISCONTD: butamben-tetracaine-benzocaine (CETACAINE) spray    PRN Wendall Stade, MD   2 spray at 01/13/11 1358  . DISCONTD: diazepam (VALIUM) tablet 5 mg  5 mg Oral On Call W Ashley Royalty., MD      . DISCONTD: diphenhydrAMINE (BENADRYL) 12.5 MG/5ML elixir 6.25 mg  6.25 mg Oral Q8H PRN Pleas Koch, MD   12.5 mg at 01/08/11 1948  . DISCONTD: fentaNYL (SUBLIMAZE) 0.05 MG/ML injection           . DISCONTD: fentaNYL (SUBLIMAZE) injection 250 mcg  250 mcg Intravenous Once W Ashley Royalty., MD      . DISCONTD: furosemide (LASIX) injection 40 mg  40 mg Intravenous BID Gerrit Friends. Rothbart, MD   40 mg at 01/13/11 0800  . DISCONTD: midazolam (VERSED) 10 MG/2ML injection 10 mg  10 mg Intravenous Once W Ashley Royalty., MD      . DISCONTD: midazolam (VERSED) 10 MG/2ML injection    PRN Wendall Stade, MD   2 mg at 01/13/11 1401  . DISCONTD: moxifloxacin (AVELOX) tablet 400 mg  400 mg Oral q1800 Shan Levans, MD   400 mg at 01/12/11 1709  . DISCONTD: sodium chloride 0.9 % injection 3 mL  3 mL Intravenous Q12H W Ashley Royalty., MD      . DISCONTD: sodium chloride 0.9 % injection 3 mL  3 mL Intravenous PRN W Ashley Royalty., MD      . DISCONTD: sodium chloride 0.9 % injection 3 mL  3 mL Intravenous PRN W Ashley Royalty., MD   3 mL at 01/12/11 2134    Allergies  Allergen Reactions  . Celebrex (Celecoxib) Other (See Comments)     Reaction=anemia/leukopenia  . Diovan (Valsartan) Other (See Comments)    Reaction=increase potassium/acute renal failure    Review of Systems:  General:  Decreased appetite, normal energy, 3-5 lb weight loss over several months  Respiratory:  no cough, no wheezing, no hemoptysis, no pain with inspiration or cough  Cardiac:   no chest pain or tightness, recent onset exertional SOB  with resting SOB at the time of admission that has since improved, no PND, no orthopnea, recent onset mild LE edema, no palpitations, no syncope  GI:   no difficulty swallowing, no hematochezia, no hematemesis, no melena, some constipation, occasional diarrhea   GU:   no dysuria, no urgency, no frequency   Musculoskeletal: Chronic back pain due to arthritis, stable   Vascular:  no pain suggestive of claudication   Neuro:   no symptoms suggestive of TIA's, no seizures, no headaches, no peripheral neuropathy   Endocrine:  Negative   HEENT:  no loose teeth or painful teeth,  some recent vision loss both eyes  Psych:   no anxiety, some depression related to brother's recent passing    Physical Exam:   BP 113/68  Pulse 84  Temp(Src) 98.3 F (36.8 C) (Oral)  Resp 18  Ht 5\' 3"  (1.6 m)  Wt 56.654 kg (124 lb 14.4 oz)  BMI 22.12 kg/m2  SpO2 97%  General:  Elderly, frail, but  well-appearing  HEENT:  Unremarkable   Neck:   no JVD, right scar from CEA with bruit, no adenopathy   Chest:   clear to auscultation, symmetrical breath sounds, no wheezes, no rhonchi   CV:   RRR, grade III/VI holosystolic murmur best at right sternal border  Abdomen:  soft, non-tender, no masses   Extremities:  warm, well-perfused, pulses palpable in groin, non-palpable at ankle  Rectal/GU  Deferred  Neuro:   Grossly non-focal and symmetrical throughout  Skin:   Clean and dry, no rashes, no breakdown  Diagnostic Tests:  Both transthoracic and transesophageal echocardiograms are reviewed. These demonstrate mitral valve prolapse with  mitral regurgitation and normal left ventricular systolic function. Images are best on transesophageal echocardiogram where there is obvious flail leaflet of the posterior leaflet of the mitral valve and severe mitral regurgitation. 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 Jet of regurgitation courses anteriorly around the left atrium. There is some left atrial enlargement. There is trace aortic insufficiency. There is mild aortic valve sclerosis without aortic stenosis. There is mild left ventricular hypertrophy. Right ventricular size and function is normal. There is trivial tricuspid regurgitation.  Impression:  Mitral valve prolapse with flail segment of the posterior leaflet of the mitral valve and severe mitral regurgitation. The patient has had relatively recent onset of symptoms of congestive heart failure that are now improved on medical therapy. Risks of surgical intervention will be somewhat elevated because of the patient's age. However, the patient remains remarkably active and functionally independent and without any other significant long-term medical problems that should affect her long-term survival. She does suffer from chronic back pain and this may affect her physical recovery. Nevertheless, I feel it is reasonable to consider elective surgical intervention.  Plan:  I discussed matters at length with the patient and her 2 daughters in her hospital room. She is scheduled to have cardiac catheterization performed later today by Dr. [name]. In the absence of significant coronary artery disease she might be candidate for minimally invasive approach for mitral valve repair. If she in fact has significant coronary artery disease she would need combined mitral valve repair and coronary artery bypass grafting performed through a conventional sternotomy. We discussed the indications, risks, and potential  benefits of surgery at length. We discussed expectations regarding her physical recovery with or without a variety of possible postoperative complications. All of her questions been addressed. The  patient tentatively desires to proceed with surgery next week. We will schedule her to return to the office on Monday for followup to make final arrangements for surgery. I would favor starting her on amiodarone prophylactically to decrease the risks of perioperative atrial arrhythmias.    Salvatore Decent. Cornelius Moras, MD

## 2011-01-14 NOTE — Op Note (Signed)
PROCEDURE:  Right and left  heart catheterization with selective coronary angiography, left ventriculogram, distal aortogram, Swan-Ganz catheterization with measurement right heart pressures and cardiac output.  INDICATIONS:  Congestive heart failure, assess for surgery  The risks, benefits, and details of the procedure were explained to the patient.  The patient verbalized understanding and wanted to proceed.  Informed written consent was obtained.  PROCEDURE TECHNIQUE:  After Xylocaine anesthesia a 45F sheath was placed in the right femoral artery with a single anterior needle wall stick.   A 7 French sheath was placed the right femoral vein percutaneously. The right heart catheterization was done with a Swan-Ganz catheter and cardiac outputs were measured. Pulmonary artery saturations were obtained. The coronary arteries were then visualized with 5 French left and right Judkins catheters. A 30 cc ventriculogram was performed and then a 30 cc distal aortic injection was performed. She tolerated the procedure well and was taken to the holding area for sheath removal.   CONTRAST:  Total of 100 cc.  COMPLICATIONS:  None.    HEMODYNAMICS:   Right atrium: A. = 5, V. = 5, mean = 3. Right ventricle: 25/3 Pulmonary artery 25/10, mean = 16  saturation 66% PCWP: A. = 9, V. = 9, mean  = 8 Aorta: 109/41  saturation 94%  Left ventricle: 109/5-8  Cardiac output: 2.82 L per minute by thermodilution, 5.09 L per minute by Fick.  ANGIOGRAPHIC DATA:     Coronary arteries: Arise and distribute normally. There was very minimal calcification involving the left main coronary artery.  Left main coronary artery: Normal Left anterior descending colon tortuous but contains no significant disease Circumflex coronary artery: Tortuous but contains no significant disease Right coronary artery: Large shepherd's crook, no significant disease   LEFT VENTRICULOGRAM:  Left ventricular angiogram was done in the 30 RAO  projection. The aortic valve appeared normal. There was no gradient across the aortic valve. There is mild-to-moderate mitral annular calcification noted. There is severe mitral regurgitation noted. Left ventricle is somewhat small chambered and has hyperdynamic left ventricular systolic function with an estimated ejection fraction of 70%.   DISTAL AORTOGRAM:  Calcification and mild atherosclerotic narrowing of the distal aorta. Both iliacs appear widely patent at the bifurcation.   IMPRESSIONS:  1. Severe mitral regurgitation with relatively normal right heart pressures 2. Normal right heart pressures 3. No significant coronary atherosclerotic disease with very mild coronary calcification noted 4. Mild atherosclerotic disease involving the distal aorta   RECOMMENDATION:    The patient will be considered for mitral valve repair through a minimally invasive approach. Will discuss further with family.   Darden Palmer MD [facility]

## 2011-01-14 NOTE — H&P (Addendum)
    Patient seen and examined.  No interval change in history and exam since last note 01/13/11.  Stable for procedure.  Darden Palmer. MD [facility]  01/14/2011

## 2011-01-14 NOTE — Progress Notes (Signed)
Desiree [name]  77 y.o.  female  Subjective:  Patient had a cardiac catheterization earlier today. She had no significant coronary artery disease and had relatively normal pulmonary pressures. I spoke with Dr. Cornelius Moras earlier today who reviewed the TEE and felt that she would be a good candidate for minimally invasive mitral valve surgery. Also spoke with the pulmonologist to question whether in fact she even had pneumonia. She is currently feeling much better and is not have any shortness of breath. Her catheterization site is fine.  Allergy: Celebrex and Diovan  Objective: Vital signs in last 24 hours: Temp:  [98.1 F (36.7 C)-98.8 F (37.1 C)] 98.8 F (37.1 C) (11/21 1510) Pulse Rate:  [78-92] 78  (11/21 1510) Resp:  [18] 18  (11/21 1510) BP: (103-134)/(62-91) 103/63 mmHg (11/21 1510) SpO2:  [94 %-97 %] 97 % (11/21 1510) Weight:  [56.654 kg (124 lb 14.4 oz)] 124 lb 14.4 oz (56.654 kg) (11/21 0419)  124 lb 14.4 oz (56.654 kg) Body mass index is 22.12 kg/(m^2).  Weight change: -0.61 kg (-1 lb 5.5 oz) Last BM Date: 01/13/11  Intake/Output from previous day: 11/20 0701 - 11/21 0700 In: 533 [P.O.:480; I.V.:53] Out: 901 [Urine:900; Stool:1]  General-Well developed; no acute distress Neck-noJVD Lungs: clear Cardiovascular-normal PMI; normal S1 and S2; grade 3/6 holosystolic murmur at the left sternal border and apex Abdomen-normal bowel sounds; soft and non-tender without masses or organomegaly Skin-Warm, no significant lesions Extremities-cath site is clean and dry without hematoma or ecchymosis  CBC:    Basename 01/12/11 0320  WBC 5.3  HGB 10.9*  HCT 33.1*  PLT 244   BMET:   Basename 01/14/11 0530 01/13/11 0313  NA 135 136  K 3.7 3.9  CL 98 100  CO2 27 26  GLUCOSE 93 104*  BUN 21 19  CREATININE 0.84 0.85  CALCIUM 9.2 9.4   Hepatic Function:    GFR:  Estimated Creatinine Clearance: 41.2 ml/min (by C-G formula based on Cr of 0.84).   Medications: I have reviewed the  patient's current medications.   Assessment/Plan: Congestive heart failure-  This has improved with diuresis at the present time and is likely due to recent onset of severe mitral regurgitation. Hypertension: Adequately controlled with current medication. Mitral regurgitation: This appears to be her sole problem. Her coronaries did not have any significant disease in her aorta had some mild atherosclerotic disease but no severe stenoses.  IMPRESSIONS:  Dr. Cornelius Moras has seen the patient and will plan for her to be started on amiodarone 200 mg twice daily in anticipation of minimally invasive mitral valve surgery next Wednesday. He plans to see her back on Monday. I will also see her again next week. She should be discharged tomorrow if her catheterization site is fine on furosemide 40 mg daily.  Fully discussed with both of her daughters about options of medical treatment, potential mitral valve surgery and I feel in light of her good functional status despite her age that this would be likely her best long-term option.   LOS: 6 days   Desiree [name],Desiree [name] 01/14/2011, 4:51 PM

## 2011-01-14 NOTE — Progress Notes (Signed)
Subjective: No current complaints. Had a restful night. Is awaiting cardiac catheterization this morning.  Objective: Vital signs in last 24 hours: Temp:  [97 F (36.1 C)-99.7 F (37.6 C)] 98.3 F (36.8 C) (11/21 0953) Pulse Rate:  [77-92] 84  (11/21 0953) Resp:  [12-51] 18  (11/21 0953) BP: (91-161)/(57-91) 113/68 mmHg (11/21 0953) SpO2:  [94 %-100 %] 97 % (11/21 0953) Weight:  [56.654 kg (124 lb 14.4 oz)-56.79 kg (125 lb 3.2 oz)] 124 lb 14.4 oz (56.654 kg) (11/21 0419) Weight change: -0.61 kg (-1 lb 5.5 oz) Last BM Date: 01/13/11  Intake/Output from previous day: 11/20 0701 - 11/21 0700 In: 533 [P.O.:480; I.V.:53] Out: 901 [Urine:900; Stool:1] Total I/O In: 3 [I.V.:3] Out: -    Physical Exam: General: Alert, awake, oriented x3, in no acute distress. HEENT: No bruits, no goiter. Heart: Regular rate and rhythm, loud, holosystolic murmur, rubs, gallops. Lungs: Clear to auscultation bilaterally. Abdomen: Soft, nontender, nondistended, positive bowel sounds. Extremities: No clubbing cyanosis or edema with positive pedal pulses. Neuro: Grossly intact, nonfocal.    Lab Results: Basic Metabolic Panel:  Basename 01/14/11 0530 01/13/11 0313 01/12/11 0320  NA 135 136 --  K 3.7 3.9 --  CL 98 100 --  CO2 27 26 --  GLUCOSE 93 104* --  BUN 21 19 --  CREATININE 0.84 0.85 --  CALCIUM 9.2 9.4 --  MG -- -- 1.7  PHOS -- -- --   CBC:  Basename 01/12/11 0320  WBC 5.3  NEUTROABS --  HGB 10.9*  HCT 33.1*  MCV 93.0  PLT 244   Coagulation:  Basename 01/14/11 0530  LABPROT 15.4*  INR 1.19    Recent Results (from the past 240 hour(s))  URINE CULTURE     Status: Normal   Collection Time   01/08/11 11:49 AM      Component Value Range Status Comment   Specimen Description URINE, CATHETERIZED   Final    Special Requests NONE   Final    Setup Time [id number]   Final    Colony Count NO GROWTH   Final    Culture NO GROWTH   Final    Report Status 01/10/2011 FINAL    Final   MRSA PCR SCREENING     Status: Normal   Collection Time   01/09/11  1:39 AM      Component Value Range Status Comment   MRSA by PCR NEGATIVE  NEGATIVE  Final   CULTURE, BLOOD (ROUTINE X 2)     Status: Normal (Preliminary result)   Collection Time   01/09/11  6:40 AM      Component Value Range Status Comment   Specimen Description BLOOD LEFT ARM   Final    Special Requests BOTTLES DRAWN AEROBIC AND ANAEROBIC 6CC   Final    Setup Time [id number]   Final    Culture     Final    Value:        BLOOD CULTURE RECEIVED NO GROWTH TO DATE CULTURE WILL BE HELD FOR 5 DAYS BEFORE ISSUING A FINAL NEGATIVE REPORT   Report Status PENDING   Incomplete   CULTURE, BLOOD (ROUTINE X 2)     Status: Normal (Preliminary result)   Collection Time   01/09/11  6:50 AM      Component Value Range Status Comment   Specimen Description BLOOD LEFT HAND   Final    Special Requests BOTTLES DRAWN AEROBIC AND ANAEROBIC 7CC   Final    Setup Time  [id number]   Final    Culture     Final    Value:        BLOOD CULTURE RECEIVED NO GROWTH TO DATE CULTURE WILL BE HELD FOR 5 DAYS BEFORE ISSUING A FINAL NEGATIVE REPORT   Report Status PENDING   Incomplete     Studies/Results: No results found.  Medications: Scheduled Meds:   . aspirin  324 mg Oral Pre-Cath  . diazepam  5 mg Oral On Call  . furosemide  40 mg Oral Daily  . levothyroxine  75 mcg Oral Daily  . metoprolol  50 mg Oral BID  . moxifloxacin  400 mg Oral q1800  . pantoprazole  40 mg Oral Q1200  . sodium chloride  3 mL Intravenous Q12H  . spironolactone  25 mg Oral Daily  . DISCONTD: aspirin EC  81 mg Oral Daily  . DISCONTD: diazepam  5 mg Oral On Call  . DISCONTD: furosemide  40 mg Intravenous BID  . DISCONTD: moxifloxacin  400 mg Oral q1800   Continuous Infusions:   . sodium chloride    . DISCONTD: sodium chloride 10 mL/hr at 01/13/11 0906  . DISCONTD: sodium chloride    . DISCONTD: sodium chloride 250 mL (01/12/11 1710)   PRN  Meds:.acetaminophen, HYDROcodone-acetaminophen, DISCONTD: sodium chloride, DISCONTD: butamben-tetracaine-benzocaine, DISCONTD: diphenhydrAMINE, DISCONTD: midazolam, DISCONTD: sodium chloride  Assessment/Plan:  Principal Problem:  *Mitral regurgitation Active Problems:  HTN (hypertension)  Acute diastolic congestive heart failure  CAP (community acquired pneumonia)  Anemia, mild  Hypokalemia  #1 acute respiratory failure: Improved, suspect mostly related to severe mitral regurgitation and diastolic heart failure.  #2 acute diastolic CHF: Improved, maintains a negative fluid balance. Likely related to severe mitral regurg.  #3 severe mitral regurgitation: TEE shows a flail mitral valve leaflet.  #4 pneumonia: This remains a questionable diagnosis. I believe most of her symptoms were related to congestive heart failure from severe mitral regurgitation. However I will complete course of Avelox as scheduled.  #5 disposition: Cardiology has scheduled patient for a cardiac catheterization today. They are then planning to obtain a CVT S. consultation to determine if mitral valve repair is a feasible option.   LOS: 6 days   HERNANDEZ [name],Desiree 01/14/2011, 10:26 AM

## 2011-01-14 NOTE — Brief Op Note (Signed)
01/08/2011 - 01/14/2011  1:52 PM  PATIENT:  Desiree [name]  77 y.o. female  PRE-OPERATIVE DIAGNOSIS:  heart failure  POST-OPERATIVE DIAGNOSIS:  Severe mitral regurgitation, normal coronary arteries, mild atherosclerotic disease aorta, normal right heart pressures  PROCEDURE:  Procedure(s): LEFT AND RIGHT HEART CATHETERIZATION WITH CORONARY ANGIOGRAM   Cardiac cath done without complications. Preliminary report above. See full report for the details.   Cardiologist:  Darden Palmer. M.D. FACC

## 2011-01-14 NOTE — Progress Notes (Signed)
OT Cancellation Note  Treatment cancelled today due to patient receiving procedure in cath lab.  OT to return as time allows for OT tx.  Thank you,  01/14/2011 Elizabethanne Lusher K. Beatrice-Mitchell, MS, OTR/L Occupational Therapist Acute Rehabilitation [facility] Phone: [phone] Pager: [phone] [email or web address]

## 2011-01-14 NOTE — Progress Notes (Signed)
01/14/2011 Desiree [name] Case Management Note [phone]        77yo female patient admitted on 01/08/11 with SOB requiring Bi-pap usage in ED. History is significant for HTN, A-fib, CHF... Prior to admission, patient lived at home alone, independent with ADLs. Reports having good support from family and friends. No needs noted at present. CM will continue to monitor.

## 2011-01-14 NOTE — Progress Notes (Signed)
   Patient Details:    Desiree [name] is an 77 y.o. female. Admitted with acute resp failure in setting of PNA and  Decompensated diastolic heart failure.      Culture Date Result  BCx2 11/16    Antibiotic Indication Start Stop  Rocephin  avelox vanco azithro Cap Cap Cap cap 11/15 11/18 11/15  11/15 11/17  11/17 11/17   GI Prophylaxis DVT Prophylaxis  PPI SCD   Protocols     Consultants  Cards LHC     Date Events  11/20 TEE>>>severe MR, NML EF, Mild TR,    Subjective:    Overnight Issues:  Feels better. Awaiting cardiac cath later today Objective:  Vital signs  Temp:  [97 F (36.1 C)-99.7 F (37.6 C)] 98.3 F (36.8 C) (11/21 0953) Pulse Rate:  [77-92] 84  (11/21 0953) Resp:  [12-51] 18  (11/21 0953) BP: (91-161)/(57-91) 113/68 mmHg (11/21 0953) SpO2:  [94 %-100 %] 97 % (11/21 0953) Weight:  [124 lb 14.4 oz (56.654 kg)-125 lb 3.2 oz (56.79 kg)] 124 lb 14.4 oz (56.654 kg) (11/21 0419)   Intake/Output Summary (Last 24 hours) at 01/14/11 1158 Last data filed at 01/14/11 0954  Gross per 24 hour  Intake    436 ml  Output    801 ml  Net   -365 ml    Physical Exam:  Gen: Pleasant, well-nourished, in no distress,  normal affect ENT: No lesions,  mouth clear,  oropharynx clear, Neck: No JVD, no TMG, no carotid bruits Lungs: No use of accessory muscles, no dullness to percussion, decreased both bases.  Cardiovascular: RRR, heart sounds normal,loud systolic murmur Abdomen: soft and NT, no HSM,  BS normal Musculoskeletal: No deformities, no cyanosis or clubbing Neuro: alert, non focal Skin: Warm, no lesions or rashes   PCXR: 11/19 improving right basilar aeration              11/20 no new films  LAB RESULTS  Lab 01/14/11 0530 01/13/11 0313 01/12/11 0320  NA 135 136 134*  K 3.7 3.9 4.5  CL 98 100 100  CO2 27 26 26   BUN 21 19 16   CREATININE 0.84 0.85 0.86  GLUCOSE 93 104* 91  ]  Lab 01/12/11 0320 01/11/11 1010 01/11/11 0335  HGB 10.9* 11.2*  11.1*  HCT 33.1* 32.8* 33.0*  WBC 5.3 6.3 5.3  PLT 244 223 204  } ABG   Additional lab data   Assessment/Plan:  Principal Problem: 1) Acute respiratory failure with hypoxia in setting of decompensated heart failure with resultant pulmonary edema and pleural effusions. -Keep dry, wean fio2 as needed  2) HTN (hypertension) -per cards    3) diastolic congestive heart failure, further complicated by valvular disease.  Echo shows ejection fraction 75%.  TEE with nml EF, Severe MR. Plan -Per cardiology -Continue beta blocker -plan for TEE today  4) CAP (community acquired pneumonia). All cultures negative Plan -see dashboard/ will stop after 10 d total rx  5) Anemia, mild  Lab 01/12/11 0320 01/11/11 1010 01/11/11 0335  HGB 10.9* 11.2* 11.1*  ] Plan: -Monitor CBC daily transfuse for hemoglobin less than 8    Canary Brim, NP-C [facility] Pgr: [phone]   Attending Addendum:  I have seen the patient, discussed the issues, test results and plans with B. Veleta Miners, NP. I agree with the Assessment and Plan as outlined above.  Revel Stellmach S. 01/14/2011 2:44 PM

## 2011-01-15 ENCOUNTER — Inpatient Hospital Stay (HOSPITAL_COMMUNITY): Payer: 59

## 2011-01-15 LAB — CULTURE, BLOOD (ROUTINE X 2)
Culture  Setup Time: 201211160844
Culture: NO GROWTH

## 2011-01-15 LAB — BASIC METABOLIC PANEL
BUN: 22 mg/dL (ref 6–23)
Calcium: 9.3 mg/dL (ref 8.4–10.5)
GFR calc Af Amer: 64 mL/min — ABNORMAL LOW (ref >90)
GFR calc non Af Amer: 55 mL/min — ABNORMAL LOW (ref >90)
Potassium: 3.8 mEq/L (ref 3.5–5.1)
Sodium: 136 mEq/L (ref 135–145)

## 2011-01-15 LAB — CBC
Hemoglobin: 10.4 g/dL — ABNORMAL LOW (ref 12.0–15.0)
MCHC: 32.3 g/dL (ref 30.0–36.0)
Platelets: 208 10*3/uL (ref 150–400)
RDW: 13.3 % (ref 11.5–15.5)

## 2011-01-15 MED ORDER — HYDROCODONE-ACETAMINOPHEN 5-325 MG PO TABS: 1.0000 | ORAL_TABLET | Freq: Four times a day (QID) | ORAL | Status: DC | PRN

## 2011-01-15 MED ORDER — METOPROLOL TARTRATE 50 MG PO TABS: 50.0000 mg | ORAL_TABLET | Freq: Two times a day (BID) | ORAL | Status: DC

## 2011-01-15 MED ORDER — FUROSEMIDE 40 MG PO TABS: 40.0000 mg | ORAL_TABLET | Freq: Every day | ORAL | Status: DC

## 2011-01-15 MED ORDER — SPIRONOLACTONE 25 MG PO TABS: 25.0000 mg | ORAL_TABLET | Freq: Every day | ORAL | Status: DC

## 2011-01-15 MED ORDER — MOXIFLOXACIN HCL 400 MG PO TABS: 400.0000 mg | ORAL_TABLET | Freq: Every day | ORAL | Status: DC

## 2011-01-15 MED ORDER — AMIODARONE HCL 200 MG PO TABS: 200.0000 mg | ORAL_TABLET | Freq: Two times a day (BID) | ORAL | Status: DC

## 2011-01-15 NOTE — Discharge Summary (Signed)
Physician Discharge Summary  Patient ID: Desiree [name] MRN: [id number] DOB/AGE: [id number] 75 y.o.  Admit date: 01/08/2011 Discharge date: 01/15/2011  Primary Care Physician:  Ancil Boozer, MD   Discharge Diagnoses:    Principal Problem:  *Mitral regurgitation Active Problems:  HTN (hypertension)  Acute diastolic congestive heart failure  CAP (community acquired pneumonia)  Anemia, mild  Hypokalemia    Current Discharge Medication List    START taking these medications   Details  amiodarone (PACERONE) 200 MG tablet Take 1 tablet (200 mg total) by mouth 2 (two) times daily. Qty: 60 tablet, Refills: 0    furosemide (LASIX) 40 MG tablet Take 1 tablet (40 mg total) by mouth daily. Qty: 30 tablet, Refills: 1    HYDROcodone-acetaminophen (NORCO) 5-325 MG per tablet Take 1 tablet by mouth every 6 (six) hours as needed. Qty: 30 tablet, Refills: 0    moxifloxacin (AVELOX) 400 MG tablet Take 1 tablet (400 mg total) by mouth daily at 6 PM. Qty: 2 tablet, Refills: 0    spironolactone (ALDACTONE) 25 MG tablet Take 1 tablet (25 mg total) by mouth daily. Qty: 30 tablet, Refills: 1      CONTINUE these medications which have CHANGED   Details  metoprolol (LOPRESSOR) 50 MG tablet Take 1 tablet (50 mg total) by mouth 2 (two) times daily. Qty: 30 tablet, Refills: 1      CONTINUE these medications which have NOT CHANGED   Details  aspirin 81 MG tablet Take 81 mg by mouth daily.     atorvastatin (LIPITOR) 80 MG tablet Take 80 mg by mouth at bedtime.     beta carotene w/minerals (OCUVITE) tablet Take 1 tablet by mouth daily.      levothyroxine (SYNTHROID, LEVOTHROID) 75 MCG tablet Take 75 mcg by mouth daily.       STOP taking these medications     celecoxib (CELEBREX) 200 MG capsule      hydrochlorothiazide (HYDRODIURIL) 25 MG tablet      HYDROcodone-acetaminophen (VICODIN) 5-500 MG per tablet      ondansetron (ZOFRAN-ODT) 4 MG disintegrating tablet      polyethylene  glycol (MIRALAX / GLYCOLAX) packet      ranitidine (ZANTAC) 150 MG tablet          Disposition and Follow-up:  Patient will be discharged home today in stable condition. She already has her mitral valve surgery scheduled for Wednesday and will followup with Dr. Cornelius Moras on Monday.  Consults:  cardiology Dr. [name], CVT S. Dr. Cornelius Moras   Significant Diagnostic Studies:  Dg Chest Right Decubitus  01/08/2011  *RADIOLOGY REPORT*  Clinical Data: Shortness of breath and weakness  CHEST - RIGHT DECUBITUS  Comparison: 01/08/2011 chest radiograph  Findings: Detail is obscured by the patient's arms.  Bilateral free flowing moderate sized pleural effusions are again noted.  The right lung is obscured by overlying soft tissue.  The visualized left lung field is clear but the costophrenic angle is omitted from the field of view.  IMPRESSION: Bilateral moderate free flowing pleural effusions.  Original Report Authenticated By: Harrel Lemon, M.D.   Dg Chest Port 1 View  01/08/2011  *RADIOLOGY REPORT*  Clinical Data: Shortness of breath, anxiety, nausea  PORTABLE CHEST - 1 VIEW  Comparison: Chest x-ray of 09/27/2007  Findings: The lungs are poorly aerated with airspace disease bilaterally.  There appears to be a right pleural effusion present as well.  These findings most likely represent pneumonia although edema cannot be excluded.  The heart is  mildly enlarged.  No bony abnormality is seen.  IMPRESSION: Bilateral airspace disease with right effusion.  Consider pneumonia versus edema.  Original Report Authenticated By: Juline Patch, M.D.   Brief H and P: For complete details please refer to admission H and P, but in brief patient is a pleasant white 77 year old woman who was initially admitted to the critical care service at [facility] with onset of acute respiratory failure, believed to be secondary to acute diastolic CHF as well as pneumonia. She was later transferred to our service at [facility]  secondary to need for TEE and cardiac catheterization.    Hospital Course:  Principal Problem:  *Mitral regurgitation Active Problems:  HTN (hypertension)  Acute diastolic congestive heart failure  CAP (community acquired pneumonia)  Anemia, mild  Hypokalemia  #1 acute respiratory failure: Resolved, believed to be secondary to acute diastolic CHF secondary to severe mitral valve regurgitation as well as possibly community-acquired pneumonia.  #2 acute diastolic CHF: She has been appropriately diuresis. Believe secondary to severe mitral regurgitation.  #3 severe mitral valve regurgitation: She is scheduled for minimally invasive mitral valve surgery tentatively on Wednesday with Dr. Cornelius Moras, she had a cardiac catheterization this admission that showed relatively normal right heart pressures as well as no coronary artery disease. Dr. Cornelius Moras has recommended that she start amiodarone for  perioperative arrhythmia prophylaxis.  #4 questionable community-acquired pneumonia: This is an uncertain diagnosis. However she has only 2 more days left of Avelox upon discharge but she has been given a prescription for.  Time spent on Discharge: Greater than 30 minutes.  SignedChaya Jan 01/15/2011, 10:15 AM

## 2011-01-15 NOTE — Plan of Care (Signed)
Problem: Discharge Progression Outcomes Goal: Able to perform self care activities Outcome: Adequate for Discharge With assistance

## 2011-01-15 NOTE — Plan of Care (Signed)
Problem: Phase III Progression Outcomes Goal: Other Phase III Outcomes/Goals Outcome: Adequate for Discharge Plan: readmission for MVR surgery as per pt as dicussed with MD

## 2011-01-16 ENCOUNTER — Encounter (HOSPITAL_COMMUNITY): Payer: Self-pay

## 2011-01-19 ENCOUNTER — Encounter (HOSPITAL_COMMUNITY): Payer: Self-pay | Admitting: Cardiovascular Disease

## 2011-01-19 ENCOUNTER — Encounter (HOSPITAL_COMMUNITY)
Admission: RE | Admit: 2011-01-19 | Discharge: 2011-01-19 | Disposition: A | Discharge status: Home / Self Care | Payer: 59 | Source: Ambulatory Visit | Attending: Thoracic Surgery (Cardiothoracic Vascular Surgery) | Admitting: Thoracic Surgery (Cardiothoracic Vascular Surgery)

## 2011-01-19 ENCOUNTER — Ambulatory Visit (HOSPITAL_COMMUNITY)
Admission: RE | Admit: 2011-01-19 | Discharge: 2011-01-19 | Disposition: A | Discharge status: Home / Self Care | Payer: 59 | Source: Ambulatory Visit | Attending: Thoracic Surgery (Cardiothoracic Vascular Surgery) | Admitting: Thoracic Surgery (Cardiothoracic Vascular Surgery)

## 2011-01-19 ENCOUNTER — Encounter: Payer: Self-pay | Admitting: Thoracic Surgery (Cardiothoracic Vascular Surgery)

## 2011-01-19 ENCOUNTER — Ambulatory Visit (INDEPENDENT_AMBULATORY_CARE_PROVIDER_SITE_OTHER): Payer: 59 | Admitting: Thoracic Surgery (Cardiothoracic Vascular Surgery)

## 2011-01-19 VITALS — BP 135/64 | HR 78 | Resp 18 | Ht 59.0 in | Wt 124.0 lb

## 2011-01-19 DIAGNOSIS — E039 Hypothyroidism, unspecified: Secondary | ICD-10-CM | POA: Insufficient documentation

## 2011-01-19 DIAGNOSIS — I739 Peripheral vascular disease, unspecified: Secondary | ICD-10-CM

## 2011-01-19 DIAGNOSIS — E785 Hyperlipidemia, unspecified: Secondary | ICD-10-CM | POA: Insufficient documentation

## 2011-01-19 DIAGNOSIS — Z01812 Encounter for preprocedural laboratory examination: Secondary | ICD-10-CM | POA: Insufficient documentation

## 2011-01-19 DIAGNOSIS — I2510 Atherosclerotic heart disease of native coronary artery without angina pectoris: Secondary | ICD-10-CM | POA: Insufficient documentation

## 2011-01-19 DIAGNOSIS — I059 Rheumatic mitral valve disease, unspecified: Secondary | ICD-10-CM

## 2011-01-19 DIAGNOSIS — Z01818 Encounter for other preprocedural examination: Secondary | ICD-10-CM | POA: Insufficient documentation

## 2011-01-19 DIAGNOSIS — I340 Nonrheumatic mitral (valve) insufficiency: Secondary | ICD-10-CM

## 2011-01-19 DIAGNOSIS — Z01810 Encounter for preprocedural cardiovascular examination: Secondary | ICD-10-CM

## 2011-01-19 HISTORY — DX: Pneumonia, unspecified organism: J18.9

## 2011-01-19 HISTORY — DX: Hypertensive heart disease without heart failure: I11.9

## 2011-01-19 HISTORY — DX: Peripheral vascular disease, unspecified: I73.9

## 2011-01-19 HISTORY — DX: Cardiac murmur, unspecified: R01.1

## 2011-01-19 HISTORY — DX: Disorder of arteries and arterioles, unspecified: I77.9

## 2011-01-19 HISTORY — DX: Diverticulosis of intestine, part unspecified, without perforation or abscess without bleeding: K57.90

## 2011-01-19 LAB — URINALYSIS, ROUTINE W REFLEX MICROSCOPIC
Bilirubin Urine: NEGATIVE
Glucose, UA: NEGATIVE mg/dL
Hgb urine dipstick: NEGATIVE
Ketones, ur: NEGATIVE mg/dL
Leukocytes, UA: NEGATIVE
Protein, ur: NEGATIVE mg/dL
pH: 6.5 (ref 5.0–8.0)

## 2011-01-19 LAB — COMPREHENSIVE METABOLIC PANEL
Alkaline Phosphatase: 97 U/L (ref 39–117)
BUN: 17 mg/dL (ref 6–23)
CO2: 24 mEq/L (ref 19–32)
Calcium: 9.4 mg/dL (ref 8.4–10.5)
GFR calc Af Amer: 68 mL/min — ABNORMAL LOW (ref >90)
GFR calc non Af Amer: 59 mL/min — ABNORMAL LOW (ref >90)
Glucose, Bld: 95 mg/dL (ref 70–99)
Total Protein: 6.9 g/dL (ref 6.0–8.3)

## 2011-01-19 LAB — BLOOD GAS, ARTERIAL
Acid-Base Excess: 2.4 mmol/L — ABNORMAL HIGH (ref 0.0–2.0)
Bicarbonate: 26 mEq/L — ABNORMAL HIGH (ref 20.0–24.0)
FIO2: 0.21 %
O2 Saturation: 97.8 %
Patient temperature: 98.6
pO2, Arterial: 87.3 mmHg (ref 80.0–100.0)

## 2011-01-19 LAB — PULMONARY FUNCTION TEST

## 2011-01-19 LAB — PROTIME-INR: Prothrombin Time: 14.2 seconds (ref 11.6–15.2)

## 2011-01-19 LAB — CBC
HCT: 35.1 % — ABNORMAL LOW (ref 36.0–46.0)
Hemoglobin: 11.7 g/dL — ABNORMAL LOW (ref 12.0–15.0)
MCH: 30.5 pg (ref 26.0–34.0)
MCHC: 33.3 g/dL (ref 30.0–36.0)
MCV: 91.6 fL (ref 78.0–100.0)
RBC: 3.83 MIL/uL — ABNORMAL LOW (ref 3.87–5.11)

## 2011-01-19 LAB — APTT: aPTT: 39 seconds — ABNORMAL HIGH (ref 24–37)

## 2011-01-19 LAB — HEMOGLOBIN A1C: Mean Plasma Glucose: 117 mg/dL — ABNORMAL HIGH (ref <117)

## 2011-01-19 MED ORDER — CHLORHEXIDINE GLUCONATE 4 % EX LIQD
30.0000 mL | CUTANEOUS | Status: DC
  Filled 2011-01-19: qty 30

## 2011-01-19 NOTE — Progress Notes (Addendum)
*  PRELIMINARY RESULTS*  Pre-Op Dopplers have been performed. Right Brachial pressure is 114 and the Left Brachial pressure is 115. Bilateral- Waveform remains normal with radial compression but obliterates with ulnar compression. *Carotid Doppler has already been performed at [facility] in October 2012. Study WNL.  Farrel Demark 01/19/2011, 11:21 AM

## 2011-01-19 NOTE — Progress Notes (Signed)
pft's and doppler's done. ekg in epic from 01/08/11 cath from 01/14/11

## 2011-01-19 NOTE — H&P (Signed)
CARDIOTHORACIC SURGERY HISTORY AND PHYSICAL EXAM  PCP is ALM,STEPHANIE, MD Referring Provider is Georga Hacking, MD   Reason for consultation:  Severe Mitral Regurgitation  HPI:  Patient is an 77 year old female from [place] with history of mitral valve prolapse followed by Dr. [name].  She also has hypertension and chronic back pain due to osteoporosis with degenerative arthritis afflicting her spine. The patient has remained remarkably physically active and completely functionally independent. 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 The patient has been under considerable stress recently do to the illness and ultimate loss of her brother who passed away recently. She has developed exertional shortness of breath relatively acutely, and was admitted to the hospital on 01/08/2011 with class IV congestive heart failure. 2-D echocardiogram performed 01/09/2011 revealed mitral regurgitation with normal left ventricular systolic function and mild left ventricular hypertrophy.  The patient's symptoms improved with diuretic therapy. Transesophageal echocardiogram was performed 01/13/2011 demonstrating mitral valve prolapse with flail segment of the posterior leaflet of the mitral valve and severe (4+) mitral regurgitation. Cardiothoracic surgical consultation has been requested to consider surgical intervention.    Past Medical History   Diagnosis  Date   .  Diverticulitis         chronic issues with consitpation and diarrhea   .  Hypertension         well controlled usually   .  Hypothyroid     .  Anemia     .  Hypercholesterolemia     .  Hypothyroidism     .  Osteoarthritis         chronic-on multiple pain meds.   .  Paroxysmal atrial fibrillation         not on coumadin-Cards=Tilley-saw him ?1 yr ago   .  CHF (congestive heart failure), NYHA class II         has sudden onset decom CHF? 2/2 to PNA   .   Lumbar disc disease         Past Surgical History   Procedure  Date   .  Carotid endarterectomy     .  Carpal tunnel release     .  Rotator cuff repair         2 on right, 1 on left   .  Abdominal hysterectomy     .  Cataract extraction     .  Knee surgery         bilateral     History reviewed. No pertinent family history.  Social History History   Substance Use Topics   .  Smoking status:  Never Smoker    .  Smokeless tobacco:  Never Used   .  Alcohol Use:  No       Current Facility-Administered Medications   Medication  Dose  Route  Frequency  Provider  Last Rate  Last Dose   .  0.9 %  sodium chloride infusion     Intravenous  Continuous  W Ashley Royalty., MD         .  acetaminophen (TYLENOL) tablet 650 mg   650 mg  Oral  Q6H PRN  Lonia Farber, MD     650 mg at 01/10/11 1715   .  aspirin chewable tablet 324 mg   324 mg  Oral  Pre-Cath  W Ashley Royalty., MD  324 mg at 01/14/11 0559   .  diazepam (VALIUM) tablet 5 mg   5 mg  Oral  On Call  W Ashley Royalty., MD         .  furosemide (LASIX) tablet 40 mg   40 mg  Oral  Daily  W Ashley Royalty., MD     40 mg at 01/14/11 0954   .  HYDROcodone-acetaminophen (NORCO) 5-325 MG per tablet 1 tablet   1 tablet  Oral  Q6H PRN  Pleas Koch, MD     1 tablet at 01/14/11 0559   .  levothyroxine (SYNTHROID, LEVOTHROID) tablet 75 mcg   75 mcg  Oral  Daily  Pleas Koch, MD     75 mcg at 01/14/11 0954   .  metoprolol (LOPRESSOR) tablet 50 mg   50 mg  Oral  BID  Pleas Koch, MD     50 mg at 01/14/11 0954   .  moxifloxacin (AVELOX) tablet 400 mg   400 mg  Oral  q1800  Anders Simmonds, NP     400 mg at 01/13/11 1822   .  pantoprazole (PROTONIX) EC tablet 40 mg   40 mg  Oral  Q1200  Shan Levans, MD     40 mg at 01/13/11 0912   .  sodium chloride 0.9 % injection 3 mL   3 mL  Intravenous  Q12H  W Ashley Royalty., MD     3 mL at 01/14/11 0954   .  spironolactone (ALDACTONE) tablet 25 mg   25 mg  Oral  Daily  Gerrit Friends.  Rothbart, MD     25 mg at 01/14/11 0954   .  DISCONTD: 0.45 % sodium chloride infusion     Intravenous  Continuous  W Ashley Royalty., MD  10 mL/hr at 01/13/11 [phone]      .  DISCONTD: 0.9 %  sodium chloride infusion   250 mL  Intravenous  PRN  Pleas Koch, MD         .  DISCONTD: 0.9 %  sodium chloride infusion   250 mL  Intravenous  Continuous  W Ashley Royalty., MD         .  DISCONTD: 0.9 %  sodium chloride infusion   250 mL  Intravenous  Continuous  W Ashley Royalty., MD  1 mL/hr at 01/12/11 1710  250 mL at 01/12/11 1710   .  DISCONTD: aspirin EC tablet 81 mg   81 mg  Oral  Daily  Pleas Koch, MD     81 mg at 01/13/11 0911   .  DISCONTD: benzocaine (HURRICAINE) 20 % mouth spray 1 application   1 application  Mouth/Throat  PRN  W Ashley Royalty., MD         .  DISCONTD: butamben-tetracaine-benzocaine (CETACAINE) spray       PRN  Wendall Stade, MD     2 spray at 01/13/11 1358   .  DISCONTD: diazepam (VALIUM) tablet 5 mg   5 mg  Oral  On Call  W Ashley Royalty., MD         .  DISCONTD: diphenhydrAMINE (BENADRYL) 12.5 MG/5ML elixir 6.25 mg   6.25 mg  Oral  Q8H PRN  Pleas Koch, MD     12.5 mg at 01/08/11 1948   .  DISCONTD: fentaNYL (SUBLIMAZE) 0.05 MG/ML injection                  .  DISCONTD: fentaNYL (SUBLIMAZE) injection 250 mcg   250 mcg  Intravenous  Once  W Ashley Royalty., MD         .  DISCONTD: furosemide (LASIX) injection 40 mg   40 mg  Intravenous  BID  Gerrit Friends. Rothbart, MD     40 mg at 01/13/11 0800   .  DISCONTD: midazolam (VERSED) 10 MG/2ML injection 10 mg   10 mg  Intravenous  Once  W Ashley Royalty., MD         .  DISCONTD: midazolam (VERSED) 10 MG/2ML injection       PRN  Wendall Stade, MD     2 mg at 01/13/11 1401   .  DISCONTD: moxifloxacin (AVELOX) tablet 400 mg   400 mg  Oral  q1800  Shan Levans, MD     400 mg at 01/12/11 1709   .  DISCONTD: sodium chloride 0.9 % injection 3 mL   3 mL  Intravenous  Q12H  W Ashley Royalty., MD         .  DISCONTD:  sodium chloride 0.9 % injection 3 mL   3 mL  Intravenous  PRN  W Ashley Royalty., MD         .  DISCONTD: sodium chloride 0.9 % injection 3 mL   3 mL  Intravenous  PRN  W Ashley Royalty., MD     3 mL at 01/12/11 2134       Allergies   Allergen  Reactions   .  Celebrex (Celecoxib)  Other (See Comments)       Reaction=anemia/leukopenia   .  Diovan (Valsartan)  Other (See Comments)       Reaction=increase potassium/acute renal failure     Review of Systems:             General:                      Decreased appetite, normal energy, 3-5 lb weight loss over several months             Respiratory:                no cough, no wheezing, no hemoptysis, no pain with inspiration or cough             Cardiac:                       no chest pain or tightness, recent onset exertional SOB with resting SOB at the time of admission that has since improved, no PND, no orthopnea, recent onset mild LE edema, no palpitations, no syncope             GI:                                no difficulty swallowing, no hematochezia, no hematemesis, no melena, some constipation, occasional diarrhea               GU:                              no dysuria, no urgency, no frequency               Musculoskeletal:         Chronic back pain due to arthritis, stable  Vascular:                     no pain suggestive of claudication               Neuro:                         no symptoms suggestive of TIA's, no seizures, no headaches, no peripheral neuropathy               Endocrine:                   Negative               HEENT:                       no loose teeth or painful teeth,  some recent vision loss both eyes             Psych:                         no anxiety, some depression related to brother's recent passing                Physical Exam:              BP 113/68  Pulse 84  Temp(Src) 98.3 F (36.8 C) (Oral)  Resp 18  Ht 5\' 3"  (1.6 m)  Wt 56.654 kg (124 lb 14.4 oz)  BMI 22.12 kg/m2   SpO2 97%             General:                      Elderly, frail, but  well-appearing             HEENT:                       Unremarkable               Neck:                           no JVD, right scar from CEA with bruit, no adenopathy               Chest:                         clear to auscultation, symmetrical breath sounds, no wheezes, no rhonchi               CV:                              RRR, grade III/VI holosystolic murmur best at right sternal border             Abdomen:                    soft, non-tender, no masses               Extremities:                 warm, well-perfused, pulses palpable in groin, non-palpable at ankle             Rectal/GU  Deferred             Neuro:                         Grossly non-focal and symmetrical throughout             Skin:                            Clean and dry, no rashes, no breakdown  Diagnostic Tests:  Both transthoracic and transesophageal echocardiograms are reviewed. These demonstrate mitral valve prolapse with mitral regurgitation and normal left ventricular systolic function. Images are best on transesophageal echocardiogram where there is obvious flail leaflet of the posterior leaflet of the mitral valve and severe mitral regurgitation. 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 Jet of regurgitation courses anteriorly around the left atrium. There is some left atrial enlargement. There is trace aortic insufficiency. There is mild aortic valve sclerosis without aortic stenosis. There is mild left ventricular hypertrophy. Right ventricular size and function is normal. There is trivial tricuspid regurgitation.  Left and right heart catheterization performed by Dr. [name] 01/14/2011 is reviewed. This demonstrates normal coronary artery anatomy with no significant coronary artery disease. There was normal left ventricular systolic  function. There was severe mitral regurgitation with moderate mitral annular calcification. Imaging of the descending thoracic and abdominal aorta demonstrates fairly significant atherosclerotic plaque although there is only mild narrowing of the distal aorta. Right heart catheterization is notable for pulmonary artery pressures measured 25/10 pulmonary Lid wedge pressure of 8.   Impression:  Mitral valve prolapse with flail segment of the mitral valve and severe mitral regurgitation associated with recent onset symptoms of congestive heart failure.  The patient has normal left ventricular systolic function and no significant coronary artery disease. Catheterization does demonstrate significant mitral annular calcification as well as significant atherosclerotic plaque involving the abdominal aorta. The patient would be reasonable candidate for minimally invasive approach for mitral valve repair, although the risks of stroke might be slightly elevated particularly given the patient's advanced age and the presence of significant aortoiliac occlusive disease.  Plan:  The rationale for elective mitral valve repair surgery has been explained, including a comparison between surgery and continued medical therapy with close follow-up.  The likelihood of successful and durable valve repair has been discussed with particular reference to the findings of their recent echocardiogram.  Based upon these findings and previous experience, I have quoted them a greater than 90% percent likelihood of successful valve repair.  In the unlikely event that their valve cannot be successfully repaired, we discussed the possibility of replacing the mitral valve using a bioprosthetic tissue valve.  Given the patient's advanced age potential for late structural valve deterioration and failure during her lifetime should be low.  Alternative surgical approaches have been discussed, including a comparison between conventional sternotomy  and minimally-invasive techniques.  The relative risks and benefits of each have been reviewed as they pertain to the patient's specific circumstances, and all of their questions have been addressed.  Of all the possible perioperative risks, the patient is most concerned about a small but significant risk of stroke. Because the risk of stroke may be slightly increased with minimally invasive techniques when femoral artery cannulation is utilized and because of the fact the patient has significant aortoiliac occlusive disease, we have decided to proceed  with her surgery through a conventional sternotomy.  All of her questions of the patient and her family have been answered.   Salvatore Decent. Cornelius Moras, MD  Routing History...    Date/Time From To Method  01/14/2011 11:41 AM Purcell Nails, MD Marcelino Scot, RN In [employer]  01/14/2011 11:38 AM Purcell Nails, MD Marcelino Scot, RN In [facility]  01/14/2011 11:38 AM Purcell Nails, MD Shan Levans, MD In Basket  01/14/2011 11:38 AM Purcell Nails, MD Estela Philip Aspen Fax  01/14/2011 11:38 AM Purcell Nails, MD Shanker Ghimire Fax  01/14/2011 11:38 AM Purcell Nails, MD Pandora Leiter Fax  01/14/2011 11:38 AM Purcell Nails, MD Pleas Koch, MD Fax  01/14/2011 11:38 AM Purcell Nails, MD Laray Anger, DO Fax  01/14/2011 11:38 AM Purcell Nails, MD Felisa Bonier, MD Fax  01/14/2011 11:38 AM Purcell Nails, MD W Ashley Royalty., MD Fax

## 2011-01-19 NOTE — Progress Notes (Signed)
Chart reviewed. Left for anesthesia to review chest xray results

## 2011-01-19 NOTE — Progress Notes (Signed)
[address]            [name] [address]          [phone]     CARDIOTHORACIC SURGERY OFFICE NOTE  Referring Provider is Georga Hacking, MD PCP is Ancil Boozer, MD   HPI:  Patient returns for follow-up of mitral regurgitation. She was seen in the hospital last week and a full consultation note was dictated at that time. She subsequently underwent left and right heart catheterization by Dr. [name]. This confirmed the absence of any significant coronary artery disease. The patient was discharged from the hospital after catheterization in returns for followup today with tentatively plans to proceed with elective mitral valve repair this week.  Current Outpatient Prescriptions  Medication Sig Dispense Refill  . amiodarone (PACERONE) 200 MG tablet Take 1 tablet (200 mg total) by mouth 2 (two) times daily.  60 tablet  0  . aspirin 81 MG tablet Take 81 mg by mouth daily.       Marland Kitchen atorvastatin (LIPITOR) 80 MG tablet Take 80 mg by mouth at bedtime.       . furosemide (LASIX) 40 MG tablet Take 1 tablet (40 mg total) by mouth daily.  30 tablet  1  . HYDROcodone-acetaminophen (NORCO) 5-325 MG per tablet Take 1 tablet by mouth every 6 (six) hours as needed. For pain.       Marland Kitchen levothyroxine (SYNTHROID, LEVOTHROID) 75 MCG tablet Take 75 mcg by mouth daily.       . metoprolol (LOPRESSOR) 50 MG tablet Take 1 tablet (50 mg total) by mouth 2 (two) times daily.  30 tablet  1  . Polyethyl Glycol-Propyl Glycol (SYSTANE OP) Apply 2 drops to eye 4 (four) times daily as needed. For dry eyes.      . polyethylene glycol (MIRALAX / GLYCOLAX) packet Take 17 g by mouth daily.        Marland Kitchen spironolactone (ALDACTONE) 25 MG tablet Take 1 tablet (25 mg total) by mouth daily.  30 tablet  1   No current facility-administered medications for this visit.   Facility-Administered Medications Ordered in Other Visits  Medication Dose Route Frequency Provider Last Rate Last Dose  .  chlorhexidine (HIBICLENS) 4 % liquid 2 application  30 mL Topical UD Purcell Nails, MD          Physical Exam:   BP 135/64  Pulse 78  Resp 18  Ht 4\' 11"  (1.499 m)  Wt 124 lb (56.246 kg)  BMI 25.04 kg/m2  SpO2 98%  Well-appearing elderly female. Breath sounds are clear to auscultation and symmetrical bilaterally. No wheezes rales or rhonchi are noted. Cardiovascular exam confirms regular rate and rhythm. There is a prominent grade 3-4/6 holosystolic murmur. The abdomen is soft nontender. Extremities are warm and well-perfused. Femoral pulses are somewhat diminished but palpable. Distal pulses are not palpable.  Diagnostic Tests:  Left and right heart catheterization performed by Dr. [name] 01/14/2011 is reviewed. This demonstrates normal coronary artery anatomy with no significant coronary artery disease. There was normal left ventricular systolic function. There was severe mitral regurgitation with moderate mitral annular calcification. Imaging of the descending thoracic and abdominal aorta demonstrates fairly significant atherosclerotic plaque although there is only mild narrowing of the distal aorta. Right heart catheterization is notable for pulmonary artery pressures measured 25/10 pulmonary Lid wedge pressure of 8.  Chest x-ray performed today demonstrates  clear lung fields bilaterally with a very tiny right pleural effusion. There no pulmonary infiltrates to suggest any recent history of pneumonia.  Impression:  Mitral valve prolapse with flail segment of the mitral valve and severe mitral regurgitation associated with recent onset symptoms of congestive heart failure.  The patient has normal left ventricular systolic function and no significant coronary artery disease. Catheterization does demonstrate significant mitral annular calcification as well as significant atherosclerotic plaque involving the abdominal aorta. The patient would be reasonable candidate for minimally invasive  approach for mitral valve repair, although the risks of stroke might be slightly elevated particularly given the patient's advanced age and the presence of significant aortoiliac occlusive disease.  Plan:  The rationale for elective mitral valve repair surgery has been explained, including a comparison between surgery and continued medical therapy with close follow-up.  The likelihood of successful and durable valve repair has been discussed with particular reference to the findings of their recent echocardiogram.  Based upon these findings and previous experience, I have quoted them a greater than 90% percent likelihood of successful valve repair.  In the unlikely event that their valve cannot be successfully repaired, we discussed the possibility of replacing the mitral valve using a bioprosthetic tissue valve.  Given the patient's advanced age potential for late structural valve deterioration and failure during her lifetime should be low.  Alternative surgical approaches have been discussed, including a comparison between conventional sternotomy and minimally-invasive techniques.  The relative risks and benefits of each have been reviewed as they pertain to the patient's specific circumstances, and all of their questions have been addressed.  Of all the possible perioperative risks, the patient is most concerned about a small but significant risk of stroke. Because the risk of stroke may be slightly increased with minimally invasive techniques when femoral artery cannulation is utilized and because of the fact the patient has significant aortoiliac occlusive disease, we have decided to proceed with her surgery through a conventional sternotomy.  All of her questions of the patient and her family have been answered.     Salvatore Decent. Cornelius Moras, MD

## 2011-01-19 NOTE — Pre-Procedure Instructions (Signed)
20 Desiree [name]  01/19/2011   Your procedure is scheduled on: 01/21/11  Report to [facility] at 630 AM.  Call this number if you have problems the morning of surgery: [phone]   Remember:   Do not eat food:After Midnight.  May have clear liquids: up to 4 Hours before arrival npo after 230am.  Clear liquids include soda, tea, black coffee, apple or grape juice, broth.  Take these medicines the morning of surgery with A SIP OF WATER metoprolol, pacerone, asa, synthroid   Do not wear jewelry, make-up or nail polish.  Do not wear lotions, powders, or perfumes. You may wear deodorant.  Do not shave 48 hours prior to surgery.  Do not bring valuables to the hospital.  Contacts, dentures or bridgework may not be worn into surgery.  Leave suitcase in the car. After surgery it may be brought to your room.  For patients admitted to the hospital, checkout time is 11:00 AM the day of discharge.   Patients discharged the day of surgery will not be allowed to drive home.  Name and phone number of your driver: charlene [phone]  Special Instructions: CHG Shower Use Special Wash: 1/2 bottle night before surgery and 1/2 bottle morning of surgery.   Please read over the following fact sheets that you were given: Pain Booklet, Coughing and Deep Breathing, Blood Transfusion Information, Open Heart Packet, MRSA Information and Surgical Site Infection Prevention Bring blue book and breathing apparatus back to hospital.

## 2011-01-20 ENCOUNTER — Encounter (HOSPITAL_COMMUNITY): Payer: Self-pay | Admitting: Vascular Surgery

## 2011-01-20 ENCOUNTER — Other Ambulatory Visit: Payer: Self-pay

## 2011-01-20 DIAGNOSIS — I059 Rheumatic mitral valve disease, unspecified: Secondary | ICD-10-CM

## 2011-01-20 MED ORDER — VANCOMYCIN HCL 1000 MG IV SOLR
1250.0000 mg | INTRAVENOUS | Status: AC
  Administered 2011-01-21: 1250 mg via INTRAVENOUS
  Filled 2011-01-20: qty 1250

## 2011-01-20 MED ORDER — DOPAMINE-DEXTROSE 3.2-5 MG/ML-% IV SOLN
2.0000 ug/kg/min | INTRAVENOUS | Status: DC
  Filled 2011-01-20: qty 250

## 2011-01-20 MED ORDER — DEXTROSE 5 % IV SOLN
1.5000 g | INTRAVENOUS | Status: AC
  Administered 2011-01-21: 1.5 g via INTRAVENOUS
  Filled 2011-01-20: qty 1.5

## 2011-01-20 MED ORDER — SODIUM CHLORIDE 0.9 % IV SOLN
INTRAVENOUS | Status: DC
  Filled 2011-01-20: qty 40

## 2011-01-20 MED ORDER — SODIUM CHLORIDE 0.9 % IV SOLN
INTRAVENOUS | Status: AC
  Administered 2011-01-21: 1.6 [IU]/h via INTRAVENOUS
  Filled 2011-01-20: qty 1

## 2011-01-20 MED ORDER — DEXTROSE 5 % IV SOLN
750.0000 mg | INTRAVENOUS | Status: DC
  Filled 2011-01-20: qty 750

## 2011-01-20 MED ORDER — METOPROLOL TARTRATE 12.5 MG HALF TABLET: 12.5000 mg | ORAL_TABLET | Freq: Once | ORAL | Status: DC

## 2011-01-20 MED ORDER — PLASMA-LYTE 148 IV SOLN
INTRAVENOUS | Status: AC
  Administered 2011-01-21: 11:00:00
  Filled 2011-01-20: qty 0.5

## 2011-01-20 MED ORDER — MAGNESIUM SULFATE 50 % IJ SOLN
40.0000 meq | INTRAMUSCULAR | Status: DC
  Filled 2011-01-20 (×2): qty 10

## 2011-01-20 MED ORDER — POTASSIUM CHLORIDE 2 MEQ/ML IV SOLN
80.0000 meq | INTRAVENOUS | Status: DC
  Filled 2011-01-20: qty 40

## 2011-01-20 MED ORDER — EPINEPHRINE HCL 1 MG/ML IJ SOLN
0.5000 ug/min | INTRAVENOUS | Status: DC
  Filled 2011-01-20: qty 4

## 2011-01-20 MED ORDER — PHENYLEPHRINE HCL 10 MG/ML IJ SOLN
30.0000 ug/min | INTRAVENOUS | Status: DC
  Filled 2011-01-20: qty 2

## 2011-01-20 MED ORDER — SODIUM CHLORIDE 0.9 % IV SOLN
0.1000 ug/kg/h | INTRAVENOUS | Status: AC
  Administered 2011-01-21: .2 ug/kg/h via INTRAVENOUS
  Filled 2011-01-20: qty 4

## 2011-01-20 MED ORDER — NITROGLYCERIN IN D5W 200-5 MCG/ML-% IV SOLN
2.0000 ug/min | INTRAVENOUS | Status: AC
  Administered 2011-01-21: 5 ug/min via INTRAVENOUS
  Filled 2011-01-20: qty 250

## 2011-01-20 NOTE — Consult Note (Signed)
Anesthesia:  Patient is an 77 year old female with MVP and severe MR for minimally invasive MV Repair.  Other past medical history includes anemia, HTN, chronic BP d/t osteoporosis, paroxysmal afib, hypothyroidism, hyperlipidemia, and CHF.  I was asked to review her preoperative labs and CXR.  Her CXR shows stable small right effusion, but no acute process.  Her labs are acceptable from an anesthesia standpoint.    She has undergone a 2D echo, TEE, and cardiac cath.  Her 2D echo was done on 01/09/11 and can be viewed under the Results Review tab.  Her TEE was done on 01/13/11 by Dr. Eden Emms and showed severe MR directed anteriorly with flail segment to the posterior leaflet, mild AV calcification, EF 65%, normal RA/RV/PV, mild TR, mild mural aortic debris, and no LAA clot (TEE can be viewed under Notes tab, titled "Procedure").  Her cath was performed by Dr. [name] on 01/14/11 and showed severe MR with relatively normal right heart pressures, no significant CAD, and mild atherosclerotic disease involving the distal aorta (Cath can be viewed under the Notes tab, titled "Op Note").    Plan to proceed.

## 2011-01-21 ENCOUNTER — Inpatient Hospital Stay (HOSPITAL_COMMUNITY): Payer: 59 | Admitting: Vascular Surgery

## 2011-01-21 ENCOUNTER — Encounter (HOSPITAL_COMMUNITY): Payer: Self-pay | Admitting: Vascular Surgery

## 2011-01-21 ENCOUNTER — Encounter (HOSPITAL_COMMUNITY)
Admission: RE | Disposition: A | Discharge status: Skilled Nursing Facility | Payer: Self-pay | Source: Ambulatory Visit | Attending: Thoracic Surgery (Cardiothoracic Vascular Surgery)

## 2011-01-21 ENCOUNTER — Inpatient Hospital Stay (HOSPITAL_COMMUNITY): Payer: 59

## 2011-01-21 ENCOUNTER — Other Ambulatory Visit: Payer: Self-pay | Admitting: Thoracic Surgery (Cardiothoracic Vascular Surgery)

## 2011-01-21 ENCOUNTER — Inpatient Hospital Stay (HOSPITAL_COMMUNITY)
Admission: RE | Admit: 2011-01-21 | Discharge: 2011-02-10 | DRG: 220 | Disposition: A | Discharge status: Skilled Nursing Facility | Payer: 59 | Source: Ambulatory Visit | Attending: Thoracic Surgery (Cardiothoracic Vascular Surgery) | Admitting: Thoracic Surgery (Cardiothoracic Vascular Surgery)

## 2011-01-21 ENCOUNTER — Encounter (HOSPITAL_COMMUNITY): Payer: Self-pay | Admitting: Thoracic Surgery (Cardiothoracic Vascular Surgery)

## 2011-01-21 DIAGNOSIS — M549 Dorsalgia, unspecified: Secondary | ICD-10-CM | POA: Diagnosis present

## 2011-01-21 DIAGNOSIS — G8929 Other chronic pain: Secondary | ICD-10-CM | POA: Diagnosis present

## 2011-01-21 DIAGNOSIS — K5900 Constipation, unspecified: Secondary | ICD-10-CM | POA: Diagnosis not present

## 2011-01-21 DIAGNOSIS — J988 Other specified respiratory disorders: Secondary | ICD-10-CM | POA: Diagnosis not present

## 2011-01-21 DIAGNOSIS — I059 Rheumatic mitral valve disease, unspecified: Principal | ICD-10-CM | POA: Diagnosis present

## 2011-01-21 DIAGNOSIS — I4891 Unspecified atrial fibrillation: Secondary | ICD-10-CM | POA: Diagnosis not present

## 2011-01-21 DIAGNOSIS — E8779 Other fluid overload: Secondary | ICD-10-CM | POA: Diagnosis not present

## 2011-01-21 DIAGNOSIS — J90 Pleural effusion, not elsewhere classified: Secondary | ICD-10-CM | POA: Diagnosis not present

## 2011-01-21 DIAGNOSIS — N289 Disorder of kidney and ureter, unspecified: Secondary | ICD-10-CM | POA: Diagnosis not present

## 2011-01-21 DIAGNOSIS — I519 Heart disease, unspecified: Secondary | ICD-10-CM | POA: Diagnosis not present

## 2011-01-21 DIAGNOSIS — D696 Thrombocytopenia, unspecified: Secondary | ICD-10-CM | POA: Diagnosis not present

## 2011-01-21 DIAGNOSIS — I4892 Unspecified atrial flutter: Secondary | ICD-10-CM | POA: Diagnosis not present

## 2011-01-21 DIAGNOSIS — D62 Acute posthemorrhagic anemia: Secondary | ICD-10-CM | POA: Diagnosis not present

## 2011-01-21 DIAGNOSIS — R110 Nausea: Secondary | ICD-10-CM | POA: Diagnosis not present

## 2011-01-21 DIAGNOSIS — R5381 Other malaise: Secondary | ICD-10-CM | POA: Diagnosis not present

## 2011-01-21 DIAGNOSIS — R0902 Hypoxemia: Secondary | ICD-10-CM | POA: Diagnosis present

## 2011-01-21 DIAGNOSIS — I2699 Other pulmonary embolism without acute cor pulmonale: Secondary | ICD-10-CM | POA: Diagnosis not present

## 2011-01-21 DIAGNOSIS — E039 Hypothyroidism, unspecified: Secondary | ICD-10-CM | POA: Diagnosis present

## 2011-01-21 DIAGNOSIS — K219 Gastro-esophageal reflux disease without esophagitis: Secondary | ICD-10-CM | POA: Diagnosis present

## 2011-01-21 DIAGNOSIS — Z9889 Other specified postprocedural states: Secondary | ICD-10-CM

## 2011-01-21 DIAGNOSIS — I10 Essential (primary) hypertension: Secondary | ICD-10-CM | POA: Diagnosis present

## 2011-01-21 DIAGNOSIS — M810 Age-related osteoporosis without current pathological fracture: Secondary | ICD-10-CM | POA: Diagnosis present

## 2011-01-21 HISTORY — PX: MITRAL VALVE REPAIR: SHX2039

## 2011-01-21 LAB — POCT I-STAT 3, ART BLOOD GAS (G3+)
Acid-Base Excess: 5 mmol/L — ABNORMAL HIGH (ref 0.0–2.0)
Acid-base deficit: 1 mmol/L (ref 0.0–2.0)
Bicarbonate: 23.9 mEq/L (ref 20.0–24.0)
Bicarbonate: 29 mEq/L — ABNORMAL HIGH (ref 20.0–24.0)
O2 Saturation: 100 %
O2 Saturation: 99 %
O2 Saturation: 100 %
TCO2: 30 mmol/L (ref 0–100)
TCO2: 26 mmol/L (ref 0–100)
pCO2 arterial: 35.9 mmHg (ref 35.0–45.0)
pCO2 arterial: 23.4 mmHg — ABNORMAL LOW (ref 35.0–45.0)
pCO2 arterial: 46.5 mmHg — ABNORMAL HIGH (ref 35.0–45.0)
pH, Arterial: 7.432 — ABNORMAL HIGH (ref 7.350–7.400)
pH, Arterial: 7.603 (ref 7.350–7.400)
pO2, Arterial: 281 mmHg — ABNORMAL HIGH (ref 80.0–100.0)
pO2, Arterial: 315 mmHg — ABNORMAL HIGH (ref 80.0–100.0)
pO2, Arterial: 187 mmHg — ABNORMAL HIGH (ref 80.0–100.0)
pO2, Arterial: 137 mmHg — ABNORMAL HIGH (ref 80.0–100.0)
pO2, Arterial: 281 mmHg — ABNORMAL HIGH (ref 80.0–100.0)

## 2011-01-21 LAB — POCT I-STAT, CHEM 8
Calcium, Ion: 1.13 mmol/L (ref 1.12–1.32)
Glucose, Bld: 119 mg/dL — ABNORMAL HIGH (ref 70–99)
HCT: 27 % — ABNORMAL LOW (ref 36.0–46.0)
Hemoglobin: 9.2 g/dL — ABNORMAL LOW (ref 12.0–15.0)
TCO2: 24 mmol/L (ref 0–100)

## 2011-01-21 LAB — APTT: aPTT: 46 seconds — ABNORMAL HIGH (ref 24–37)

## 2011-01-21 LAB — CBC
HCT: 29.1 % — ABNORMAL LOW (ref 36.0–46.0)
MCH: 30.5 pg (ref 26.0–34.0)
MCHC: 34 g/dL (ref 30.0–36.0)
MCV: 89.8 fL (ref 78.0–100.0)
Platelets: 92 10*3/uL — ABNORMAL LOW (ref 150–400)
Platelets: 132 10*3/uL — ABNORMAL LOW (ref 150–400)
RBC: 2.79 MIL/uL — ABNORMAL LOW (ref 3.87–5.11)
RDW: 14 % (ref 11.5–15.5)
WBC: 11.1 10*3/uL — ABNORMAL HIGH (ref 4.0–10.5)
WBC: 12.6 10*3/uL — ABNORMAL HIGH (ref 4.0–10.5)

## 2011-01-21 LAB — POCT I-STAT 4, (NA,K, GLUC, HGB,HCT)
Glucose, Bld: 108 mg/dL — ABNORMAL HIGH (ref 70–99)
Glucose, Bld: 113 mg/dL — ABNORMAL HIGH (ref 70–99)
Glucose, Bld: 115 mg/dL — ABNORMAL HIGH (ref 70–99)
Glucose, Bld: 98 mg/dL (ref 70–99)
HCT: 24 % — ABNORMAL LOW (ref 36.0–46.0)
HCT: 25 % — ABNORMAL LOW (ref 36.0–46.0)
Hemoglobin: 9.9 g/dL — ABNORMAL LOW (ref 12.0–15.0)
Hemoglobin: 8.2 g/dL — ABNORMAL LOW (ref 12.0–15.0)
Hemoglobin: 7.8 g/dL — ABNORMAL LOW (ref 12.0–15.0)
Hemoglobin: 8.5 g/dL — ABNORMAL LOW (ref 12.0–15.0)
Potassium: 3.5 mEq/L (ref 3.5–5.1)
Potassium: 3.5 mEq/L (ref 3.5–5.1)
Potassium: 4 mEq/L (ref 3.5–5.1)
Sodium: 136 mEq/L (ref 135–145)
Sodium: 140 mEq/L (ref 135–145)

## 2011-01-21 LAB — HEMOGLOBIN AND HEMATOCRIT, BLOOD
HCT: 24.1 % — ABNORMAL LOW (ref 36.0–46.0)
Hemoglobin: 8.4 g/dL — ABNORMAL LOW (ref 12.0–15.0)

## 2011-01-21 LAB — MAGNESIUM: Magnesium: 3.6 mg/dL — ABNORMAL HIGH (ref 1.5–2.5)

## 2011-01-21 LAB — CREATININE, SERUM
Creatinine, Ser: 0.72 mg/dL (ref 0.50–1.10)
GFR calc non Af Amer: 77 mL/min — ABNORMAL LOW (ref >90)

## 2011-01-21 LAB — PROTIME-INR: Prothrombin Time: 23.1 seconds — ABNORMAL HIGH (ref 11.6–15.2)

## 2011-01-21 LAB — GLUCOSE, CAPILLARY: Glucose-Capillary: 132 mg/dL — ABNORMAL HIGH (ref 70–99)

## 2011-01-21 SURGERY — REPAIR, MITRAL VALVE
Anesthesia: General | Site: Chest | Wound class: Clean

## 2011-01-21 MED ORDER — INSULIN REGULAR HUMAN 100 UNIT/ML IJ SOLN
INTRAMUSCULAR | Status: DC
  Filled 2011-01-21: qty 1

## 2011-01-21 MED ORDER — DEXTROSE 5 % IV SOLN
1.5000 g | Freq: Two times a day (BID) | INTRAVENOUS | Status: DC
  Administered 2011-01-21 – 2011-01-22 (×3): 1.5 g via INTRAVENOUS
  Filled 2011-01-21 (×4): qty 1.5

## 2011-01-21 MED ORDER — ASPIRIN 81 MG PO CHEW: 324.0000 mg | CHEWABLE_TABLET | Freq: Every day | ORAL | Status: DC

## 2011-01-21 MED ORDER — LACTATED RINGERS IV SOLN: 500.0000 mL | Freq: Once | INTRAVENOUS | Status: AC | PRN

## 2011-01-21 MED ORDER — MORPHINE SULFATE 4 MG/ML IJ SOLN
2.0000 mg | INTRAMUSCULAR | Status: DC | PRN
  Administered 2011-01-22: 4 mg via INTRAVENOUS
  Administered 2011-01-22: 2 mg via INTRAVENOUS
  Filled 2011-01-21 (×2): qty 1

## 2011-01-21 MED ORDER — LACTATED RINGERS IV SOLN: INTRAVENOUS | Status: DC

## 2011-01-21 MED ORDER — LACTATED RINGERS IV SOLN
INTRAVENOUS | Status: DC | PRN
  Administered 2011-01-21 (×2): via INTRAVENOUS

## 2011-01-21 MED ORDER — POTASSIUM CHLORIDE 10 MEQ/50ML IV SOLN
10.0000 meq | INTRAVENOUS | Status: AC
  Administered 2011-01-21 (×3): 10 meq via INTRAVENOUS

## 2011-01-21 MED ORDER — POTASSIUM CHLORIDE 10 MEQ/50ML IV SOLN
10.0000 meq | Freq: Once | INTRAVENOUS | Status: AC
  Administered 2011-01-21: 10 meq via INTRAVENOUS

## 2011-01-21 MED ORDER — ONDANSETRON HCL 4 MG/2ML IJ SOLN
4.0000 mg | Freq: Four times a day (QID) | INTRAMUSCULAR | Status: DC | PRN
  Administered 2011-01-22 (×2): 4 mg via INTRAVENOUS
  Filled 2011-01-21 (×2): qty 2

## 2011-01-21 MED ORDER — LEVOTHYROXINE SODIUM 75 MCG PO TABS
75.0000 ug | ORAL_TABLET | Freq: Every day | ORAL | Status: DC
  Administered 2011-01-22 – 2011-02-10 (×19): 75 ug via ORAL
  Filled 2011-01-21 (×22): qty 1

## 2011-01-21 MED ORDER — FAMOTIDINE IN NACL 20-0.9 MG/50ML-% IV SOLN
20.0000 mg | Freq: Two times a day (BID) | INTRAVENOUS | Status: DC
  Administered 2011-01-21: 20 mg via INTRAVENOUS

## 2011-01-21 MED ORDER — ACETAMINOPHEN 500 MG PO TABS
1000.0000 mg | ORAL_TABLET | Freq: Four times a day (QID) | ORAL | Status: DC
  Administered 2011-01-21 – 2011-01-23 (×5): 1000 mg via ORAL
  Filled 2011-01-21 (×8): qty 2

## 2011-01-21 MED ORDER — METOPROLOL TARTRATE 1 MG/ML IV SOLN: 2.5000 mg | INTRAVENOUS | Status: DC | PRN

## 2011-01-21 MED ORDER — VANCOMYCIN HCL 1000 MG IV SOLR
1000.0000 mg | Freq: Once | INTRAVENOUS | Status: AC
  Administered 2011-01-21: 1000 mg via INTRAVENOUS
  Filled 2011-01-21 (×2): qty 1000

## 2011-01-21 MED ORDER — ALBUMIN HUMAN 5 % IV SOLN
250.0000 mL | INTRAVENOUS | Status: AC | PRN
  Administered 2011-01-21 (×2): 250 mL via INTRAVENOUS

## 2011-01-21 MED ORDER — ROCURONIUM BROMIDE 100 MG/10ML IV SOLN
INTRAVENOUS | Status: DC | PRN
  Administered 2011-01-21: 40 mg via INTRAVENOUS
  Administered 2011-01-21: 10 mg via INTRAVENOUS

## 2011-01-21 MED ORDER — FENTANYL CITRATE 0.05 MG/ML IJ SOLN
INTRAMUSCULAR | Status: DC | PRN
  Administered 2011-01-21 (×2): 250 ug via INTRAVENOUS
  Administered 2011-01-21: 150 ug via INTRAVENOUS
  Administered 2011-01-21: 100 ug via INTRAVENOUS
  Administered 2011-01-21: 450 ug via INTRAVENOUS
  Administered 2011-01-21: 50 ug via INTRAVENOUS

## 2011-01-21 MED ORDER — ACETAMINOPHEN 160 MG/5ML PO SOLN: 975.0000 mg | Freq: Four times a day (QID) | ORAL | Status: DC

## 2011-01-21 MED ORDER — SODIUM CHLORIDE 0.9 % IV SOLN
0.1000 ug/kg/h | INTRAVENOUS | Status: DC
  Filled 2011-01-21: qty 2

## 2011-01-21 MED ORDER — CEFUROXIME SODIUM 1.5 G IJ SOLR
1.5000 g | Freq: Two times a day (BID) | INTRAMUSCULAR | Status: DC
  Filled 2011-01-21: qty 1.5

## 2011-01-21 MED ORDER — DOCUSATE SODIUM 100 MG PO CAPS
200.0000 mg | ORAL_CAPSULE | Freq: Every day | ORAL | Status: DC
  Administered 2011-01-22: 200 mg via ORAL
  Filled 2011-01-21: qty 2

## 2011-01-21 MED ORDER — SODIUM CHLORIDE 0.9 % IR SOLN
Status: DC | PRN
  Administered 2011-01-21: 5000 mL
  Administered 2011-01-21: 3000 mL

## 2011-01-21 MED ORDER — MIDAZOLAM HCL 5 MG/5ML IJ SOLN
INTRAMUSCULAR | Status: DC | PRN
  Administered 2011-01-21: 4 mg via INTRAVENOUS
  Administered 2011-01-21: 1 mg via INTRAVENOUS
  Administered 2011-01-21: 3 mg via INTRAVENOUS
  Administered 2011-01-21: 2 mg via INTRAVENOUS

## 2011-01-21 MED ORDER — ACETAMINOPHEN 650 MG RE SUPP
650.0000 mg | RECTAL | Status: AC
  Administered 2011-01-21: 650 mg via RECTAL

## 2011-01-21 MED ORDER — SODIUM CHLORIDE 0.9 % IV SOLN: INTRAVENOUS | Status: DC

## 2011-01-21 MED ORDER — SODIUM CHLORIDE 0.45 % IV SOLN
INTRAVENOUS | Status: DC
  Administered 2011-01-22: via INTRAVENOUS

## 2011-01-21 MED ORDER — METOPROLOL TARTRATE 25 MG/10 ML ORAL SUSPENSION
12.5000 mg | Freq: Two times a day (BID) | ORAL | Status: DC
  Filled 2011-01-21 (×5): qty 5

## 2011-01-21 MED ORDER — MIDAZOLAM HCL 2 MG/2ML IJ SOLN: 2.0000 mg | INTRAMUSCULAR | Status: DC | PRN

## 2011-01-21 MED ORDER — SODIUM CHLORIDE 0.9 % IV SOLN: 250.0000 mL | INTRAVENOUS | Status: DC

## 2011-01-21 MED ORDER — ACETAMINOPHEN 500 MG PO TABS: 1000.0000 mg | ORAL_TABLET | Freq: Four times a day (QID) | ORAL | Status: DC

## 2011-01-21 MED ORDER — PHENYLEPHRINE HCL 10 MG/ML IJ SOLN
0.0000 ug/min | INTRAVENOUS | Status: DC
  Filled 2011-01-21: qty 2

## 2011-01-21 MED ORDER — METOPROLOL TARTRATE 12.5 MG HALF TABLET
12.5000 mg | ORAL_TABLET | Freq: Two times a day (BID) | ORAL | Status: DC
  Filled 2011-01-21 (×5): qty 1

## 2011-01-21 MED ORDER — NITROGLYCERIN IN D5W 200-5 MCG/ML-% IV SOLN: 0.0000 ug/min | INTRAVENOUS | Status: DC

## 2011-01-21 MED ORDER — OXYCODONE HCL 5 MG PO TABS
5.0000 mg | ORAL_TABLET | ORAL | Status: DC | PRN
  Administered 2011-01-21 – 2011-01-22 (×4): 5 mg via ORAL
  Administered 2011-01-22: 10 mg via ORAL
  Administered 2011-01-23 (×2): 5 mg via ORAL
  Filled 2011-01-21 (×3): qty 1
  Filled 2011-01-21: qty 2
  Filled 2011-01-21 (×4): qty 1

## 2011-01-21 MED ORDER — INSULIN ASPART 100 UNIT/ML ~~LOC~~ SOLN
0.0000 [IU] | SUBCUTANEOUS | Status: DC
  Administered 2011-01-21 (×2): 2 [IU] via SUBCUTANEOUS
  Administered 2011-01-22: 4 [IU] via SUBCUTANEOUS
  Administered 2011-01-22: 2 [IU] via SUBCUTANEOUS
  Administered 2011-01-22: 4 [IU] via SUBCUTANEOUS
  Filled 2011-01-21: qty 3

## 2011-01-21 MED ORDER — ACETAMINOPHEN 160 MG/5ML PO SOLN: 650.0000 mg | ORAL | Status: AC

## 2011-01-21 MED ORDER — MAGNESIUM SULFATE 50 % IJ SOLN
4.0000 g | Freq: Once | INTRAVENOUS | Status: AC
  Administered 2011-01-21: 4 g via INTRAVENOUS
  Filled 2011-01-21: qty 8

## 2011-01-21 MED ORDER — PROTAMINE SULFATE 10 MG/ML IV SOLN
INTRAVENOUS | Status: DC | PRN
  Administered 2011-01-21: 200 mg via INTRAVENOUS

## 2011-01-21 MED ORDER — ACETAMINOPHEN 160 MG/5ML PO SOLN
975.0000 mg | Freq: Four times a day (QID) | ORAL | Status: DC
Start: 2011-01-22 — End: 2011-01-23
  Filled 2011-01-21: qty 40.6

## 2011-01-21 MED ORDER — ASPIRIN EC 325 MG PO TBEC
325.0000 mg | DELAYED_RELEASE_TABLET | Freq: Every day | ORAL | Status: DC
  Administered 2011-01-22: 325 mg via ORAL
  Filled 2011-01-21 (×2): qty 1

## 2011-01-21 MED ORDER — SODIUM CHLORIDE 0.9 % IJ SOLN
3.0000 mL | Freq: Two times a day (BID) | INTRAMUSCULAR | Status: DC
  Administered 2011-01-22 – 2011-01-23 (×2): 3 mL via INTRAVENOUS

## 2011-01-21 MED ORDER — CEFUROXIME SODIUM 750 MG IJ SOLR
INTRAMUSCULAR | Status: DC | PRN
  Administered 2011-01-21: 750 mg via INTRAMUSCULAR

## 2011-01-21 MED ORDER — ALBUMIN HUMAN 5 % IV SOLN
INTRAVENOUS | Status: DC | PRN
  Administered 2011-01-21 (×4): via INTRAVENOUS

## 2011-01-21 MED ORDER — PHENYLEPHRINE HCL 10 MG/ML IJ SOLN
10.0000 mg | INTRAVENOUS | Status: DC | PRN
  Administered 2011-01-21: 25 ug/min via INTRAVENOUS

## 2011-01-21 MED ORDER — SODIUM CHLORIDE 0.9 % IJ SOLN: 3.0000 mL | INTRAMUSCULAR | Status: DC | PRN

## 2011-01-21 MED ORDER — BISACODYL 10 MG RE SUPP: 10.0000 mg | Freq: Every day | RECTAL | Status: DC

## 2011-01-21 MED ORDER — BISACODYL 5 MG PO TBEC
10.0000 mg | DELAYED_RELEASE_TABLET | Freq: Every day | ORAL | Status: DC
  Administered 2011-01-22: 10 mg via ORAL
  Filled 2011-01-21: qty 2

## 2011-01-21 MED ORDER — SODIUM CHLORIDE 0.9 % IV SOLN
10.0000 g | INTRAVENOUS | Status: DC | PRN
  Administered 2011-01-21: 5 g via INTRAVENOUS

## 2011-01-21 MED ORDER — SODIUM CHLORIDE 0.9 % IV SOLN
INTRAVENOUS | Status: DC | PRN
  Administered 2011-01-21: 13:00:00 via INTRAVENOUS

## 2011-01-21 MED ORDER — GELATIN ABSORBABLE MT POWD
OROMUCOSAL | Status: DC | PRN
  Administered 2011-01-21 (×2): via TOPICAL

## 2011-01-21 MED ORDER — VECURONIUM BROMIDE 10 MG IV SOLR
INTRAVENOUS | Status: DC | PRN
  Administered 2011-01-21: 3 mg via INTRAVENOUS
  Administered 2011-01-21: 5 mg via INTRAVENOUS

## 2011-01-21 MED ORDER — MORPHINE SULFATE 2 MG/ML IJ SOLN
1.0000 mg | INTRAMUSCULAR | Status: AC | PRN
  Administered 2011-01-21 (×2): 2 mg via INTRAVENOUS
  Administered 2011-01-21 (×2): 1 mg via INTRAVENOUS
  Filled 2011-01-21 (×3): qty 1

## 2011-01-21 MED ORDER — PROPOFOL 10 MG/ML IV EMUL
INTRAVENOUS | Status: DC | PRN
  Administered 2011-01-21: 50 mg via INTRAVENOUS

## 2011-01-21 MED ORDER — LACTATED RINGERS IV SOLN
INTRAVENOUS | Status: DC | PRN
  Administered 2011-01-21: 08:00:00 via INTRAVENOUS

## 2011-01-21 MED ORDER — MAGNESIUM SULFATE 40 MG/ML IJ SOLN
INTRAMUSCULAR | Status: AC
  Filled 2011-01-21: qty 100

## 2011-01-21 MED ORDER — PANTOPRAZOLE SODIUM 40 MG PO TBEC: 40.0000 mg | DELAYED_RELEASE_TABLET | Freq: Every day | ORAL | Status: DC

## 2011-01-21 MED ORDER — HEPARIN SODIUM (PORCINE) 1000 UNIT/ML IJ SOLN
INTRAMUSCULAR | Status: DC | PRN
  Administered 2011-01-21: 20000 [IU] via INTRAVENOUS

## 2011-01-21 SURGICAL SUPPLY — 80 items
ADAPTER CARDIO PERF ANTE/RETRO (ADAPTER) ×2 IMPLANT
ATTRACTOMAT 16X20 MAGNETIC DRP (DRAPES) ×2 IMPLANT
BAG DECANTER FOR FLEXI CONT (MISCELLANEOUS) ×2 IMPLANT
BLADE STERNUM SYSTEM 6 (BLADE) ×2 IMPLANT
BLADE SURG 11 STRL SS (BLADE) ×2 IMPLANT
CANISTER SUCTION 2500CC (MISCELLANEOUS) ×2 IMPLANT
CANN PRFSN 3/8X14X24FR PCFC (MISCELLANEOUS) ×1
CANN PRFSN 3/8XCNCT ST RT ANG (MISCELLANEOUS)
CANNULA FEM VENOUS REMOTE 22FR (CANNULA) ×4 IMPLANT
CANNULA PRFSN 3/8X14X24FR PCFC (MISCELLANEOUS) ×1 IMPLANT
CANNULA PRFSN 3/8XCNCT RT ANG (MISCELLANEOUS) IMPLANT
CANNULA VEN MTL TIP RT (MISCELLANEOUS) ×1
CANNULA VENNOUS METAL TIP 20FR (CANNULA) ×2 IMPLANT
CATH ROBINSON RED A/P 18FR (CATHETERS) ×6 IMPLANT
CATH THORACIC 28FR RT ANG (CATHETERS) IMPLANT
CATH THORACIC 36FR (CATHETERS) IMPLANT
CLOTH BEACON ORANGE TIMEOUT ST (SAFETY) ×2 IMPLANT
CONN 1/2X1/2X1/2  BEN (MISCELLANEOUS) ×1
CONN 1/2X1/2X1/2 BEN (MISCELLANEOUS) ×1 IMPLANT
CONN 3/8X1/2 ST GISH (MISCELLANEOUS) ×4 IMPLANT
COVER SURGICAL LIGHT HANDLE (MISCELLANEOUS) ×4 IMPLANT
CRADLE DONUT ADULT HEAD (MISCELLANEOUS) ×2 IMPLANT
DRAIN CHANNEL 32F RND 10.7 FF (WOUND CARE) ×2 IMPLANT
DRAPE SLUSH/WARMER DISC (DRAPES) IMPLANT
DRSG COVADERM 4X14 (GAUZE/BANDAGES/DRESSINGS) ×2 IMPLANT
ELECT REM PT RETURN 9FT ADLT (ELECTROSURGICAL) ×4
ELECTRODE REM PT RTRN 9FT ADLT (ELECTROSURGICAL) ×2 IMPLANT
GLOVE BIO SURGEON STRL SZ 6 (GLOVE) IMPLANT
GLOVE BIO SURGEON STRL SZ 6.5 (GLOVE) ×10 IMPLANT
GLOVE BIO SURGEON STRL SZ7 (GLOVE) ×4 IMPLANT
GLOVE BIO SURGEON STRL SZ7.5 (GLOVE) IMPLANT
GLOVE ORTHO TXT STRL SZ7.5 (GLOVE) IMPLANT
GOWN STRL NON-REIN LRG LVL3 (GOWN DISPOSABLE) ×8 IMPLANT
HEMOSTAT POWDER SURGIFOAM 1G (HEMOSTASIS) ×4 IMPLANT
INSERT FOGARTY XLG (MISCELLANEOUS) ×2 IMPLANT
KIT BASIN OR (CUSTOM PROCEDURE TRAY) ×2 IMPLANT
KIT DILATOR VASC 18G NDL (KITS) ×4 IMPLANT
KIT PAIN CUSTOM (MISCELLANEOUS) IMPLANT
KIT ROOM TURNOVER OR (KITS) ×2 IMPLANT
KIT SUCTION CATH 14FR (SUCTIONS) ×2 IMPLANT
LINE VENT (MISCELLANEOUS) ×2 IMPLANT
NS IRRIG 1000ML POUR BTL (IV SOLUTION) ×8 IMPLANT
PACK OPEN HEART (CUSTOM PROCEDURE TRAY) ×2 IMPLANT
RING MITRAL MEMO 3D 26MM SMD26 (Prosthesis & Implant Heart) ×2 IMPLANT
SET CARDIOPLEGIA MPS 5001102 (MISCELLANEOUS) ×2 IMPLANT
SET IRRIG TUBING LAPAROSCOPIC (IRRIGATION / IRRIGATOR) ×2 IMPLANT
SPONGE GAUZE 4X4 12PLY (GAUZE/BANDAGES/DRESSINGS) ×4 IMPLANT
SUCKER INTRACARDIAC WEIGHTED (SUCKER) ×2 IMPLANT
SUT BONE WAX W31G (SUTURE) IMPLANT
SUT ETHIBOND (SUTURE) ×4 IMPLANT
SUT ETHIBOND 2 0 SH (SUTURE) ×2 IMPLANT
SUT ETHIBOND 2 0 SH 36X2 (SUTURE) ×2 IMPLANT
SUT ETHIBOND 2 0 V4 (SUTURE) IMPLANT
SUT ETHIBOND 2 0V4 GREEN (SUTURE) IMPLANT
SUT ETHIBOND 2-0 RB-1 WHT (SUTURE) ×4 IMPLANT
SUT ETHIBOND 4 0 TF (SUTURE) IMPLANT
SUT ETHIBOND 5 0 C 1 30 (SUTURE) ×2 IMPLANT
SUT ETHIBOND X763 2 0 SH 1 (SUTURE) IMPLANT
SUT MNCRL AB 3-0 PS2 18 (SUTURE) IMPLANT
SUT PDS AB 1 CTX 36 (SUTURE) IMPLANT
SUT PROLENE 3 0 SH 1 (SUTURE) ×2 IMPLANT
SUT PROLENE 3 0 SH DA (SUTURE) ×6 IMPLANT
SUT PROLENE 4 0 RB 1 (SUTURE) ×2
SUT PROLENE 4 0 SH DA (SUTURE) ×4 IMPLANT
SUT PROLENE 4-0 RB1 .5 CRCL 36 (SUTURE) ×2 IMPLANT
SUT SILK  1 MH (SUTURE)
SUT SILK 1 MH (SUTURE) IMPLANT
SUT STEEL 6MS V (SUTURE) IMPLANT
SUT STEEL STERNAL CCS#1 18IN (SUTURE) IMPLANT
SUT STEEL SZ 6 DBL 3X14 BALL (SUTURE) IMPLANT
SUT VIC AB 2-0 CTX 27 (SUTURE) IMPLANT
SYSTEM SAHARA CHEST DRAIN ATS (WOUND CARE) ×2 IMPLANT
TAPE CLOTH SURG 4X10 WHT LF (GAUZE/BANDAGES/DRESSINGS) ×2 IMPLANT
TAPE PAPER 3X10 WHT MICROPORE (GAUZE/BANDAGES/DRESSINGS) ×2 IMPLANT
TOWEL OR 17X24 6PK STRL BLUE (TOWEL DISPOSABLE) ×2 IMPLANT
TOWEL OR 17X26 10 PK STRL BLUE (TOWEL DISPOSABLE) ×2 IMPLANT
TRAY FOLEY IC TEMP SENS 14FR (CATHETERS) ×2 IMPLANT
TUBING INSUFFLATION 10FT LAP (TUBING) ×2 IMPLANT
UNDERPAD 30X30 INCONTINENT (UNDERPADS AND DIAPERS) ×2 IMPLANT
WATER STERILE IRR 1000ML POUR (IV SOLUTION) ×4 IMPLANT

## 2011-01-21 NOTE — Procedures (Signed)
Extubation Procedure Note  Patient Details:   Name: Desiree [name] DOB: [birth year] MRN: [id number]   Airway Documentation:  Patient extubated to 4 lpm nasal cannula.  VC 700cc, NIF -20, able to breathe around deflated cuff.  Able to follow commands and hold head off bed 15 sec. Able to vocalize post extubation. No complications.  Evaluation  O2 sats: stable throughout Complications: No apparent complications Patient did tolerate procedure well. Bilateral Breath Sounds: Clear   Yes  Pietrina Jagodzinski, Aloha Gell 01/21/2011, 7:44 PM

## 2011-01-21 NOTE — Anesthesia Preprocedure Evaluation (Addendum)
Anesthesia Evaluation  Patient identified by MRN, date of birth, ID band Patient awake    Reviewed: Allergy & Precautions, H&P , NPO status , Patient's Chart, lab work & pertinent test results, reviewed documented beta blocker date and time   Airway Mallampati: II TM Distance: >3 FB   Mouth opening: Limited Mouth Opening  Dental  (+) Teeth Intact and Dental Advisory Given   Pulmonary  clear to auscultation        Cardiovascular hypertension, Pt. on home beta blockers +CHF + Valvular Problems/Murmurs MR Regular Normal    Neuro/Psych    GI/Hepatic GERD- (occasional reflux with certain foods)  ,  Endo/Other  Hypothyroidism   Renal/GU      Musculoskeletal  (+) Arthritis -, Osteoarthritis,    Abdominal   Peds  Hematology   Anesthesia Other Findings   Reproductive/Obstetrics                         Anesthesia Physical Anesthesia Plan  ASA: III  Anesthesia Plan: General   Post-op Pain Management:    Induction: Intravenous  Airway Management Planned: Oral ETT  Additional Equipment: Arterial line, PA Cath and 3D TEE  Intra-op Plan:   Post-operative Plan:   Informed Consent: I have reviewed the patients History and Physical, chart, labs and discussed the procedure including the risks, benefits and alternatives for the proposed anesthesia with the patient or authorized representative who has indicated his/her understanding and acceptance.   Dental advisory given  Plan Discussed with:   Anesthesia Plan Comments:         Anesthesia Quick Evaluation

## 2011-01-21 NOTE — Brief Op Note (Signed)
01/21/2011  1:18 PM  PATIENT:  Desiree [name]  77 y.o. female  PRE-OPERATIVE DIAGNOSIS:  mitral regurgitation  POST-OPERATIVE DIAGNOSIS:  Mitral regurgitation  PROCEDURE:  MITRAL VALVE REPAIR (MVR)  SURGEON:  Salvatore Decent. 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

## 2011-01-21 NOTE — Procedures (Signed)
Patient Name: Desiree [name] MRN: [id number] Age: 77 y.o. Sex: female  HPI: The patient is a 77 y.o. female presenting with  history of severe mitral regurgitation and mitral valve prolapse. She is having worsening exertional dyspnea and is now scheduled to undergo mitral valve repair by Dr. Cornelius Moras. Intraoperative transesophageal echocardiography was indicated to evaluate the mitral valve to assist with the mitral valve repair.  The patient was brought to the operating room. Anesthesia was induced without difficulty. The trachea was intubated without difficulty. Following orogastric suctions, the transesophageal echo probe without difficulty.   Impression:   PRE-BYPASS FINDINGS: 1. Aortic valve -The aortic valve was trileaflet and opened normally. There was no significant calcification of the leaflets. There was trace aortic insufficiency.  2. Mitral valve - there was moderate to severe mitral regurgitation. This was due to a flail segment of the middle scallop of the posterior leaflet (P2). This flail segment was relatively small and involved only the central portion of P2. This resulted in an anteriorly directed jet of mitral regurgitation that  tracked along the superior surface of the anterior leaflet and the anterior wall of the left atrium. This jet was graded as 3+. There was moderate to severe mitral annular calcification involving the bases of both the anterior and posterior leaflets. The remaining areas of both mitral leaflets were not prolapsing and did not appear to be excessively thickened or redundant.   3. Left ventricle - EF  65%  there was vigorous contractility in all segments interrogated and there was mild left ventricular hypertrophy. The left ventricular wall thickness measured 1.15-1.20 concentrically.   4. Right ventricle - There was normal right ventricular size and normal contractility of the right ventricular free wall and normal right ventricular function.   5. Tricuspid  valve - The tricuspid valve appeared to be structurally intact and there was 1-2+ tricuspid regurgitation with a central jet. 6. Interatrial septum - The the interatrial septum was intact without evidence of patent foramen ovale or atrial septal defect by color Doppler and bubble study.  7. Left atrium - there was no thrombus noted in the left atrium or left atrial appendage.  8. Left atrial appendage -  No  thrombus noted.  9. Ascending aorta - there was moderate calcification of the a.c. name aorta especially in the area of the right sinus of Valsalva. The ascending aorta was not aneurysmal.  10. Descending aorta -there was mild to moderate atheromatous disease noted in the descending aorta.  11. Pericardium - there was no pericardial effusion.  POST-BYPASS FINDINGS: Aortic valve - the aortic valve was unchanged from the pre-bypass study and there was trace aortic insufficiency.  : Mitral valve there was an annuloplasty ring in the mitral position. The posterior leaflet was fixed and the anterior leaflet opened with the characteristic trapdoor motion of a post annuloplasty mitral valve. There was no residual mitral insufficiency by color Doppler. Continuous wave interrogation of the mitral inflow revealed a mean gradient of 4 mmHg.  Left ventricle: There was  vigorous contractility in all segments interrogated and the ejection fraction was 65%. Right ventricle - the right ventricular function appeared normal. There was normal contractility of the right ventricular free wall.  Tricuspid valve - There was mild to moderate tricuspid regurgitation noted.

## 2011-01-21 NOTE — Addendum Note (Signed)
Addendum  created 01/21/11 1918 by Melonie Florida, MD   Modules edited:Notes Section

## 2011-01-21 NOTE — Interval H&P Note (Signed)
History and Physical Interval Note:   01/21/2011   8:07 AM   Desiree [name]  has presented today for surgery, with the diagnosis of mitral regurgitation  The various methods of treatment have been discussed with the patient and family. After consideration of risks, benefits and other options for treatment, the patient has consented to  Procedure(s): MITRAL VALVE REPAIR (MVR) as a surgical intervention .  The patients' history has been reviewed, patient examined, no change in status, stable for surgery.  I have reviewed the patients' chart and labs.  Questions were answered to the patient's satisfaction.     Purcell Nails  MD

## 2011-01-21 NOTE — Anesthesia Postprocedure Evaluation (Signed)
  Anesthesia Post-op Note  Patient: Desiree [name]  Procedure(s) Performed:  MITRAL VALVE REPAIR (MVR)  Patient Location: PACU and SICU  Anesthesia Type: General  Level of Consciousness: sedated and unresponsive  Airway and Oxygen Therapy: Patient remains intubated per anesthesia plan  Post-op Pain: none  Post-op Assessment: Post-op Vital signs reviewed and Patient's Cardiovascular Status Stable  Post-op Vital Signs: stable  Complications: No apparent anesthesia complications

## 2011-01-21 NOTE — Preoperative (Signed)
Beta Blockers   Reason not to administer Beta Blockers:Not Applicable 

## 2011-01-21 NOTE — Transfer of Care (Signed)
Immediate Anesthesia Transfer of Care Note  Patient: Desiree [name]  Procedure(s) Performed:  MITRAL VALVE REPAIR (MVR)  Patient Location: PACU and SICU  Anesthesia Type: General  Level of Consciousness: sedated  Airway & Oxygen Therapy: Patient remains intubated per anesthesia plan  Post-op Assessment: Post -op Vital signs reviewed and stable  Post vital signs: stable  Complications: No apparent anesthesia complications

## 2011-01-21 NOTE — Op Note (Signed)
CARDIOTHORACIC SURGERY OPERATIVE NOTE  Date of Procedure: 01/21/2011  Preoperative Diagnosis: Severe Mitral Regurgitation  Postoperative Diagnosis: Same  Procedure:    Mitral Valve Repair  Complex valvuloplasty including artificial Gore-tex neocord replacement x2 and closure of cleft between P2 and P3  26 mm Sorin Memo 3D ring annuloplasty    Surgeon:  Salvatore Decent. Cornelius Moras, MD Assistant:  Al Corpus, CSFA Anesthesiologist: Kipp Brood, MD  Operative Findings:  Type II dysfunction with severe mitral regurgitation  Fibroelastic deficiency type degenerative disease  Flail portion of posterior leaflet (P2)  Severe calcification of posterior mitral annulus  Normal left ventricular systolic function  No residual mitral regurgitation following successful valve repair   BRIEF CLINICAL NOTE AND INDICATIONS FOR SURGERY  Patient is an 77 year old female from [place] with history of mitral valve prolapse followed by Dr. [name]. She also has hypertension and chronic back pain due to osteoporosis with degenerative arthritis afflicting her spine. The patient has remained remarkably physically active and completely functionally independent. 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 The patient has been under considerable stress recently do to the illness and ultimate loss of her brother who passed away recently. She has developed exertional shortness of breath relatively acutely, and was admitted to the hospital on 01/08/2011 with class IV congestive heart failure. 2-D echocardiogram performed 01/09/2011 revealed mitral regurgitation with normal left ventricular systolic function and mild left ventricular hypertrophy. The patient's symptoms improved with diuretic therapy. Transesophageal echocardiogram was performed 01/13/2011 demonstrating mitral valve prolapse with flail segment of the posterior leaflet of the mitral  valve and severe (4+) mitral regurgitation. Cardiac catheterization reveals no significant coronary artery disease but does demonstrate significant atherosclerotic plaque in the abdominal aorta.  A full consultation note has been documented previously.  The patient provides full informed consent for the procedure as describe.    DETAILS OF THE OPERATIVE PROCEDURE  The patient is brought to the operating room on the above mentioned date and central monitoring was established by the anesthesia team including placement of Swan-Ganz catheter and radial arterial line. The patient is placed in the supine position on the operating table.  Intravenous antibiotics are administered. General endotracheal anesthesia is induced uneventfully. A Foley catheter is placed.  Baseline transesophageal echocardiogram was performed.  Findings were notable for flail middle scallop (P2) of the posterior leaflet and severe (4+) mitral regurgitation.  There was severe calcification in the posterior mitral annulus.  There was mild aortic insufficiency and normal left ventricular systolic function.  The patient's chest, abdomen, both groins, and both lower extremities are prepared and draped in a sterile manner. A time out procedure is performed.  A median sternotomy incision was performed and the pericardium is opened. The ascending aorta is normal in appearance. The right common femoral vein is cannulated using the Seldinger technique and a guidewire advanced into the right atrium using TEE guidance.  The patient is heparinized systemically and the right common femoral vein cannulated using a 22 Fr long femoral venous cannula.  The ascending aorta is cannulated for cardiopulmonary bypass.  Adequate heparinization is verified.   A retrograde cardioplegia cannula is placed through the right atrium into the coronary sinus.   The entire pre-bypass portion of the operation was notable for stable hemodynamics.  Cardiopulmonary  bypass was begun and the surface of the heart is inspected.  A second venous cannula is placed directly into the superior vena cava.  A cardioplegia cannula is placed in the ascending aorta.  A temperature probe was placed in the interventricular septum.  The patient is cooled to 32C systemic temperature.  The aortic cross clamp is applied and cold blood cardioplegia is delivered initially in an antegrade fashion through the aortic root.   Supplemental cardioplegia is given retrograde through the coronary sinus catheter.  Iced saline slush is applied for topical hypothermia.  The initial cardioplegic arrest is rapid with early diastolic arrest.  Repeat doses of cardioplegia are administered intermittently throughout the entire cross clamp portion of the operation through the aortic root and through the coronary sinus catheter in order to maintain completely flat electrocardiogram and septal myocardial temperature below 15C.  Myocardial protection was felt to be  excellent.  A left atriotomy incision was performed through the interatrial groove and extended partially across the back wall of the left atrium after opening the oblique sinus inferiorly.  The mitral valve is exposed using a self-retaining retractor.  The mitral valve was inspected and notable for fibroelastic deficiency type degenerative disease with multiple ruptured primary cords to the middle scallop (P2) of the posterior leaflet.  There was severe calcification of the entire posterior annulus but no significant restriction of posterior leaflet mobility.  The size of the valve was small with very thin, delicate leaflets and no redundant tissue .    Interrupted 2-0 Ethibond horizontal mattress sutures were placed circumferentially around the entire mitral annulus.  These sutures will ultimately be utilized for ring annuloplasty, and at this juncture they are utilized to suspend the valve symmetrically.  Because of the limited leaflet tissue, no  leaflet resection was planned and the flail segment was repaired using artificial Gore-tex neocord replacement.  A Gore-tex CV-4 pledgeted suture was placed through the head of the anterior papillary muscle and the suture tied. Each limb of the suture was then passed through the flail segment of the posterior leaflet beginning close to the free margin where the suture was placed from the ventricular to the atrial surface. Each limb was then woven through the posterior leaflet towards the posterior annulus in a diamond shaped fashion. The ventricle was filled with saline and the sutures tied to the appropriate length with the entire ventricle pressurized. The indentation or cleft between P2 and P3 was then closed using a single inverting Gore-Tex CV 5 suture. The valve appeared to be perfectly competent at this juncture. The valve was sized to accept a 26 mm annuloplasty ring. This corresponds precisely to the dimensions of the anterior leaflet. A Sorin memo 3-D annuloplasty ring (size 26mm, catalog [id number], serial [id number]) was secured in place uneventfully. The valve was tested with saline and appears to be perfectly competent. There is a broad symmetrical line of coaptation of the anterior and posterior leaflets. There is no residual mitral regurgitation.  Rewarming was begun.  The atriotomy was closed using a 2-layer closure of running 3-0 Prolene suture after placing a sump drain across the mitral valve to serve as a left ventricular vent.  One final dose of warm retrograde "hot shot" cardioplegia was administered retrograde through the coronary sinus catheter while all air was evacuated through the aortic root.  The aortic cross clamp was removed after a total cross clamp time of 76 minutes.  Epicardial pacing wires are fixed to the right ventricular outflow tract and to the right atrial appendage. The patient is rewarmed to 37C temperature. The aortic and left ventricular vents are removed.  The patient is  weaned and disconnected from cardiopulmonary bypass.  The patient's rhythm at separation from bypass was AV paced.  The patient was weaned from cardioplegic bypass without any inotropic support. Total cardiopulmonary bypass time for the operation was 96 minutes.  Followup transesophageal echocardiogram performed after separation from bypass revealed  a well-seated mitral annuloplasty ring and a mitral valve that was functioning normally and without any residual mitral regurgitation.  Left ventricular function was unchanged from preoperatively.  The aortic and superior vena cava cannula were removed uneventfully. Protamine was administered to reverse the anticoagulation. The femoral venous cannula was removed and manual pressure held on the groin for 30 minutes.  The mediastinum and pleural space were inspected for hemostasis and irrigated with saline solution. The mediastinum and the right pleural space were drained using 3 chest tubes placed through separate stab incisions inferiorly.  The soft tissues anterior to the aorta were reapproximated loosely. The sternum is closed with double strength sternal wire. The soft tissues anterior to the sternum were closed in multiple layers and the skin is closed with a running subcuticular skin closure.   The post-bypass portion of the operation was notable for stable rhythm and hemodynamics.  The patient received 2 units PRBCs during cardiopulmonary bypass due to baseline anemia present preoperatively with acute exacerbation of anemia due to hemodilution with cardiopulmonary bypass.  The patient tolerated the procedure well and is transported to the surgical intensive care in stable condition. There are no intraoperative complications. All sponge instrument and needle counts are verified correct at completion of the operation.     Salvatore Decent. Cornelius Moras MD

## 2011-01-21 NOTE — Anesthesia Procedure Notes (Signed)
Procedure Name: Intubation Date/Time: 01/21/2011 9:00 AM Performed by: Romie Minus Pre-anesthesia Checklist: Patient identified, Emergency Drugs available, Suction available and Patient being monitored Patient Re-evaluated:Patient Re-evaluated prior to inductionOxygen Delivery Method: Circle System Utilized Preoxygenation: Pre-oxygenation with 100% oxygen Intubation Type: IV induction Ventilation: Mask ventilation without difficulty Laryngoscope Size: Miller and 2 Grade View: Grade I Tube type: Oral Tube size: 8.0 mm Number of attempts: 1 Placement Confirmation: ETT inserted through vocal cords under direct vision and positive ETCO2 Dental Injury: Teeth and Oropharynx as per pre-operative assessment  Comments: No cuff leak with 8.0 ETT. Exchanged for 7.5 with blue stylet.

## 2011-01-21 NOTE — Progress Notes (Signed)
Patient ID: Desiree [name], female   DOB: 1924/04/24, 77 y.o.   MRN: [id number] [facility] DAILY PROGRESS NOTE                   [address]            [place] [address]          [phone]      Day of Surgery Procedure(s) (LRB): MITRAL VALVE REPAIR (MVR) (N/A)  LOS: 0 days   Subjective: Early post op  Objective: Vital signs in last 24 hours: Patient Vitals for the past 24 hrs:  BP Temp Temp src Pulse Resp SpO2  01/21/11 1730 - 97.9 F (36.6 C) Core 80  12  100 %  01/21/11 1715 - 97.7 F (36.5 C) Core 80  12  100 %  01/21/11 1700 87/53 mmHg 97.3 F (36.3 C) Core 80  12  100 %  01/21/11 1645 - 97 F (36.1 C) Core 79  23  100 %  01/21/11 1630 - 96.6 F (35.9 C) Core 80  16  100 %  01/21/11 1615 132/66 mmHg 96.3 F (35.7 C) Core 90  23  100 %  01/21/11 1605 118/53 mmHg 96.1 F (35.6 C) Core 90  12  -  01/21/11 1600 - 95.9 F (35.5 C) Core 90  12  100 %  01/21/11 1545 - 95.9 F (35.5 C) Core 90  12  100 %  01/21/11 1540 122/57 mmHg 95.9 F (35.5 C) Core 90  15  -  01/21/11 1530 - 95.7 F (35.4 C) Core 90  12  100 %  01/21/11 1515 - 95.4 F (35.2 C) Core 90  12  100 %  01/21/11 1500 - 95 F (35 C) Core 90  12  100 %  01/21/11 1445 - 94.8 F (34.9 C) Core 87  13  100 %  01/21/11 1430 - 94.8 F (34.9 C) Core 79  12  100 %  01/21/11 1415 - 94.6 F (34.8 C) Core 79  18  100 %  01/21/11 1400 - 94.3 F (34.6 C) Core 80  12  100 %  01/21/11 0648 124/63 mmHg 97.8 F (36.6 C) Oral 68  18  98 %     Hemodynamic parameters for last 24 hours: PAP: (16-28)/(10-20) 24/15 mmHg CO:  [1.8 L/min-2.5 L/min] 2.5 L/min CI:  [1.2 L/min/m2-1.7 L/min/m2] 1.7 L/min/m2  Intake/Output from previous day:   Intake/Output this shift: Total I/O In: 7009.9 [I.V.:3852.9; Blood:1707; IV Piggyback:1450] Out: 3040 [Urine:1885; Blood:625; Chest Tube:530] Current Meds: Scheduled Meds:   . acetaminophen (TYLENOL) oral liquid 160 mg/5 mL  650 mg Per Tube NOW   Or  .  acetaminophen  650 mg Rectal NOW  . acetaminophen  1,000 mg Oral Q6H   Or  . acetaminophen (TYLENOL) oral liquid 160 mg/5 mL  975 mg Per Tube Q6H  . aspirin EC  325 mg Oral Daily   Or  . aspirin  324 mg Per Tube Daily  . bisacodyl  10 mg Oral Daily   Or  . bisacodyl  10 mg Rectal Daily  . cefUROXime (ZINACEF)  IV  1.5 g Intravenous To OR  . cefUROXime (ZINACEF)  IV  1.5 g Intravenous Q12H  . dexmedetomidine (PRECEDEX) IV infusion for high rates  0.1-0.7 mcg/kg/hr Intravenous To OR  . docusate sodium  200 mg Oral Daily  . famotidine (PEPCID) IV  20 mg Intravenous Q12H  . heparin-papaverine-plasmalyte irrigation  Irrigation To OR  . insulin (NOVOLIN-R) infusion   Intravenous To OR  . levothyroxine  75 mcg Oral Daily  . magnesium sulfate infusion  4 g Intravenous Once  . metoprolol tartrate  12.5 mg Oral BID   Or  . metoprolol tartrate  12.5 mg Per Tube BID  . nitroGLYCERIN  2-200 mcg/min Intravenous To OR  . pantoprazole  40 mg Oral Q1200  . potassium chloride  10 mEq Intravenous Q1 Hr x 3  . potassium chloride  10 mEq Intravenous Once  . sodium chloride  3 mL Intravenous Q12H  . vancomycin (VANCOCIN) IVPB 1000 mg/100 mL central line  1,000 mg Intravenous Once  . vancomycin  1,250 mg Intravenous To OR  . DISCONTD: aminocaproic acid (AMICAR) for OHS   Intravenous To OR  . DISCONTD: cefUROXime (ZINACEF)  IV  750 mg Intravenous To OR  . DISCONTD: DOPamine  2-20 mcg/kg/min Intravenous To OR  . DISCONTD: epinephrine  0.5-20 mcg/min Intravenous To OR  . DISCONTD: magnesium sulfate  40 mEq Other To OR  . DISCONTD: metoprolol tartrate  12.5 mg Oral Once  . DISCONTD: phenylephrine (NEO-SYNEPHRINE) Adult infusion  30-200 mcg/min Intravenous To OR  . DISCONTD: potassium chloride  80 mEq Other To OR   Continuous Infusions:   . sodium chloride    . sodium chloride    . sodium chloride    . dexmedetomidine (PRECEDEX) IV infusion 0.3 mcg/kg/hr (01/21/11 1800)  . insulin (NOVOLIN-R)  infusion 0.7 mL/hr at 01/21/11 1557  . lactated ringers 40 mL/hr at 01/21/11 1800  . nitroGLYCERIN 15 mcg/min (01/21/11 1700)  . phenylephrine (NEO-SYNEPHRINE) Adult infusion Stopped (01/21/11 1500)   PRN Meds:.albumin human, lactated ringers, metoprolol, midazolam, morphine injection, morphine, ondansetron (ZOFRAN) IV, oxyCODONE, sodium chloride, DISCONTD: sodium chloride irrigation, DISCONTD: Surgifoam 1 Gm with 0.9% sodium chloride (4 ml) topical solution  Exam Waking up still on vent  apaced  Lab Results: CBC: Basename 01/21/11 1430 01/21/11 1358 01/21/11 1158 01/19/11 1242  WBC 11.1* -- -- 6.4  HGB 8.5* 8.5* -- --  HCT 24.8* 25.0* -- --  PLT 92* -- 121* --   BMET:  Basename 01/21/11 1358 01/21/11 1229 01/19/11 1241  NA 142 140 --  K 3.0* 4.0 --  CL -- -- 98  CO2 -- -- 24  GLUCOSE 98 115* --  BUN -- -- 17  CREATININE -- -- 0.88  CALCIUM -- -- 9.4    PT/INR:  Basename 01/21/11 1430  LABPROT 23.1*  INR 2.01*   Radiology: Dg Chest Portable 1 View  01/21/2011  *RADIOLOGY REPORT*  Clinical Data: Status post CABG  PORTABLE CHEST - 1 VIEW  Comparison: 01/19/2011  Findings: Endotracheal tube apparently positioned.  Tip of nasogastric tube terminates over the GE junction.  This could be advanced 8 cm for positioning of the side hole over the stomach. Right IJ Swan-Ganz catheter tip terminates over the main pulmonary artery.  Mediastinal drains and chest tube in place.  Right-sided pleural effusion is nearly resolved.  No pneumothorax.  Minimal prominence of the interstitial markings noted without overt edema or focal pulmonary opacity with probable retrocardiac atelectasis. Evidence of mitral valvuloplasty noted.  IMPRESSION: Expected postoperative findings as above. Retrocardiac atelectasis but no pneumothorax.  Original Report Authenticated By: Harrel Lemon, M.D.     Assessment/Plan: S/P Procedure(s) (LRB): MITRAL VALVE REPAIR (MVR) (N/A) Wean vent as tolerated Not  bleeding now, 350 first hour but now slowed, coags factors given     Delight Ovens  MD 01/21/2011 6:14 PM

## 2011-01-22 ENCOUNTER — Inpatient Hospital Stay (HOSPITAL_COMMUNITY): Payer: 59

## 2011-01-22 ENCOUNTER — Other Ambulatory Visit: Payer: Self-pay

## 2011-01-22 LAB — PREPARE PLATELET PHERESIS

## 2011-01-22 LAB — CREATININE, SERUM
GFR calc Af Amer: 49 mL/min — ABNORMAL LOW (ref >90)
GFR calc non Af Amer: 42 mL/min — ABNORMAL LOW (ref >90)

## 2011-01-22 LAB — MAGNESIUM
Magnesium: 2.9 mg/dL — ABNORMAL HIGH (ref 1.5–2.5)
Magnesium: 2.6 mg/dL — ABNORMAL HIGH (ref 1.5–2.5)

## 2011-01-22 LAB — CBC
HCT: 31.5 % — ABNORMAL LOW (ref 36.0–46.0)
Hemoglobin: 9.7 g/dL — ABNORMAL LOW (ref 12.0–15.0)
Hemoglobin: 10.4 g/dL — ABNORMAL LOW (ref 12.0–15.0)
Platelets: 146 10*3/uL — ABNORMAL LOW (ref 150–400)
RBC: 3.18 MIL/uL — ABNORMAL LOW (ref 3.87–5.11)
WBC: 15.9 10*3/uL — ABNORMAL HIGH (ref 4.0–10.5)
WBC: 17 10*3/uL — ABNORMAL HIGH (ref 4.0–10.5)

## 2011-01-22 LAB — PREPARE FRESH FROZEN PLASMA: Unit division: 0

## 2011-01-22 LAB — BASIC METABOLIC PANEL
CO2: 23 mEq/L (ref 19–32)
Calcium: 8 mg/dL — ABNORMAL LOW (ref 8.4–10.5)
GFR calc non Af Amer: 66 mL/min — ABNORMAL LOW (ref >90)
Glucose, Bld: 107 mg/dL — ABNORMAL HIGH (ref 70–99)
Potassium: 3.5 mEq/L (ref 3.5–5.1)
Sodium: 139 mEq/L (ref 135–145)

## 2011-01-22 LAB — GLUCOSE, CAPILLARY
Glucose-Capillary: 125 mg/dL — ABNORMAL HIGH (ref 70–99)
Glucose-Capillary: 102 mg/dL — ABNORMAL HIGH (ref 70–99)
Glucose-Capillary: 119 mg/dL — ABNORMAL HIGH (ref 70–99)
Glucose-Capillary: 164 mg/dL — ABNORMAL HIGH (ref 70–99)
Glucose-Capillary: 161 mg/dL — ABNORMAL HIGH (ref 70–99)

## 2011-01-22 LAB — POCT I-STAT, CHEM 8
HCT: 32 % — ABNORMAL LOW (ref 36.0–46.0)
Hemoglobin: 10.9 g/dL — ABNORMAL LOW (ref 12.0–15.0)
Potassium: 4.7 mEq/L (ref 3.5–5.1)
Sodium: 135 mEq/L (ref 135–145)

## 2011-01-22 MED ORDER — POTASSIUM CHLORIDE 10 MEQ/50ML IV SOLN
INTRAVENOUS | Status: AC
  Administered 2011-01-22: 10 meq via INTRAVENOUS
  Filled 2011-01-22: qty 50

## 2011-01-22 MED ORDER — MUPIROCIN 2 % EX OINT
TOPICAL_OINTMENT | Freq: Two times a day (BID) | CUTANEOUS | Status: AC
  Administered 2011-01-22: 1 via NASAL
  Administered 2011-01-22 – 2011-01-26 (×9): via NASAL
  Filled 2011-01-22 (×3): qty 22

## 2011-01-22 MED ORDER — POTASSIUM CHLORIDE 10 MEQ/50ML IV SOLN
10.0000 meq | INTRAVENOUS | Status: AC
  Administered 2011-01-22 (×2): 10 meq via INTRAVENOUS

## 2011-01-22 MED ORDER — POTASSIUM CHLORIDE 10 MEQ/50ML IV SOLN
10.0000 meq | INTRAVENOUS | Status: DC | PRN
  Administered 2011-01-22 (×2): 10 meq via INTRAVENOUS

## 2011-01-22 MED ORDER — POTASSIUM CHLORIDE 10 MEQ/50ML IV SOLN
INTRAVENOUS | Status: AC
  Administered 2011-01-22: 10 meq
  Filled 2011-01-22: qty 50

## 2011-01-22 MED ORDER — INSULIN ASPART 100 UNIT/ML ~~LOC~~ SOLN: 0.0000 [IU] | SUBCUTANEOUS | Status: DC

## 2011-01-22 MED ORDER — FUROSEMIDE 10 MG/ML IJ SOLN
20.0000 mg | Freq: Four times a day (QID) | INTRAMUSCULAR | Status: DC
  Administered 2011-01-22 – 2011-01-23 (×3): 20 mg via INTRAVENOUS
  Filled 2011-01-22 (×3): qty 2

## 2011-01-22 NOTE — Progress Notes (Signed)
   CARE MANAGEMENT NOTE 01/22/2011  Patient:  Desiree [name], Desiree [name]   Account Number:  [id number]  Date Initiated:  01/22/2011  Documentation initiated by:  [facility]  Subjective/Objective Assessment:   Post op MV repair.     Action/Plan:   PTA, PT INDEPENDENT, LIVES ALONE.  HAS SUPPORTIVE DAUGHTER.   Anticipated DC Date:  01/27/2011   Anticipated DC Plan:  SKILLED NURSING FACILITY  In-house referral  Clinical Social Worker      DC Planning Services  CM consult      Choice offered to / List presented to:             Status of service:  In process, will continue to follow Medicare Important Message given?   (If response is "NO", the following Medicare IM given date fields will be blank) Date Medicare IM given:   Date Additional Medicare IM given:    Discharge Disposition:    Per UR Regulation:  Reviewed for med. necessity/level of care/duration of stay  Comments:  01/22/11 Desiree Banke,RN,BSN 1345 MET WITH PT AND DAUGHTER TO DISCUSS DC PLANS.  PT WILL NEED SHORT TERM SNF AT DISCHARGE FOR REHAB PRIOR TO DC HOME. WILL OBTAIN PT/OT CONSULTS WHEN ABLE TO TOLERATE.  REFERRAL TO CSW TO FACILITATE DC TO SNF WHEN MEDICALLY STABLE FOR DISCHARGE.  LIKELY WILL PREFER [facility] AS FIRST CHOICE, PER DAUGHTER. Phone #[phone]  01-22-11 59:15am Desiree [name], RNBSN [phone] UR Completed.

## 2011-01-22 NOTE — Progress Notes (Signed)
UR Completed.  Kelcy Laible Jane [phone] 01/22/2011  

## 2011-01-22 NOTE — Progress Notes (Signed)
Patient ID: Desiree [name], female   DOB: 1924-05-29, 77 y.o.   MRN: [id number]  Filed Vitals:   01/22/11 1800 01/22/11 1900 01/22/11 1930 01/22/11 1938  BP: 104/51 108/54 104/51   Pulse: 90 90 89   Temp:    97.7 F (36.5 C)  TempSrc:    Oral  Resp: 17 19 23    Height:      Weight:      SpO2: 95% 93% 92%    A-paced at 90  sats 94% on [place]  BMET    Component Value Date/Time   NA 135 01/22/2011 1728   K 4.7 01/22/2011 1728   CL 106 01/22/2011 1728   CO2 23 01/22/2011 0330   GLUCOSE 123* 01/22/2011 1728   BUN 21 01/22/2011 1728   CREATININE 1.15* 01/22/2011 1730   CALCIUM 8.0* 01/22/2011 0330   GFRNONAA 42* 01/22/2011 1730   GFRAA 49* 01/22/2011 1730    CBC    Component Value Date/Time   WBC 17.0* 01/22/2011 1730   WBC 3.9 08/17/2005 1136   RBC 3.43* 01/22/2011 1730   RBC 3.86 08/17/2005 1136   HGB 10.4* 01/22/2011 1730   HGB 12.2 08/17/2005 1136   HCT 31.5* 01/22/2011 1730   HCT 35.6 08/17/2005 1136   PLT 149* 01/22/2011 1730   PLT 183 08/17/2005 1136   MCV 91.8 01/22/2011 1730   MCV 92.1 08/17/2005 1136   MCH 30.3 01/22/2011 1730   MCH 31.7 08/17/2005 1136   MCHC 33.0 01/22/2011 1730   MCHC 34.4 08/17/2005 1136   RDW 15.1 01/22/2011 1730   RDW 12.7 08/17/2005 1136   LYMPHSABS 0.7 01/11/2011 1010   LYMPHSABS 1.1 08/17/2005 1136   MONOABS 0.4 01/11/2011 1010   MONOABS 0.3 08/17/2005 1136   EOSABS 0.3 01/11/2011 1010   EOSABS 0.1 08/17/2005 1136   BASOSABS 0.0 01/11/2011 1010   BASOSABS 0.0 08/17/2005 1136    A/P:  Stable.  Continue present course

## 2011-01-22 NOTE — Progress Notes (Signed)
CSW completed psychosocial assessment, located in shadow chart. Pt daughter requesting SNF placement for pt at discharge. CSW will fax pt out to skilled facilities in [place] and will continue to follow for d/c planning.  Baxter Flattery, MSW [phone]

## 2011-01-22 NOTE — Progress Notes (Signed)
   CARDIOTHORACIC SURGERY PROGRESS NOTE   R1 Day Post-Op Procedure(s) (LRB): MITRAL VALVE REPAIR (MVR) (N/A)  Subjective: Looks good.  Mild soreness in chest  Objective: Vital signs: Filed Vitals:   01/22/11 0800  BP: 100/49  Pulse: 89  Temp: 98.6 F (37 C)  Resp: 21    Hemodynamics: PAP: (16-42)/(7-24) 36/18 mmHg CO:  [1.8 L/min-3 L/min] 3 L/min CI:  [1.2 L/min/m2-2 L/min/m2] 2 L/min/m2  Physical Exam:  Rhythm:   Sinus brady - AAI paced  Breath sounds: clear  Heart sounds:  RRR no murmur  Incisions:  Dressings dry  Abdomen:  Soft, non-tender  Extremities:  Warm, well-perfused   Intake/Output from previous day: 11/28 0701 - 11/29 0700 In: 8418.2 [P.O.:240; I.V.:4421.2; Blood:1707; IV Piggyback:2050] Out: 3865 [Urine:2310; Blood:625; Chest Tube:930] Intake/Output this shift: Total I/O In: 156.7 [P.O.:60; I.V.:96.7] Out: 20 [Urine:20]  Lab Results:  [facility] 01/22/11 0330 01/21/11 2043 01/21/11 2040  WBC 15.9* -- 12.6*  HGB 9.7* 9.2* --  HCT 28.5* 27.0* --  PLT 146* -- 132*   BMET:  Basename 01/22/11 0330 01/21/11 2043 01/19/11 1241  NA 139 142 --  K 3.5 4.1 --  CL 108 108 --  CO2 23 -- 24  GLUCOSE 107* 119* --  BUN 14 14 --  CREATININE 0.80 0.90 --  CALCIUM 8.0* -- 9.4    CBG (last 3)   Basename 01/22/11 0326 01/22/11 0202 01/21/11 2335  GLUCAP 102* 125* 140*   ABG    Component Value Date/Time   PHART 7.325* 01/21/2011 2123   HCO3 24.3* 01/21/2011 2123   TCO2 26 01/21/2011 2123   ACIDBASEDEF 2.0 01/21/2011 2123   O2SAT 100.0 01/21/2011 2123     Assessment/Plan: S/P Procedure(s) (LRB): MITRAL VALVE REPAIR (MVR) (N/A)  Doing well POD1 Expected mild acute blood loss anemia Mild volume excess but PA pressures low and still on low dose neo  Mobilize D/C lines Wean neo as tolerated Hold diuretics until BP increased  Nikaela Coyne H

## 2011-01-23 ENCOUNTER — Encounter (HOSPITAL_COMMUNITY): Payer: Self-pay | Admitting: Thoracic Surgery (Cardiothoracic Vascular Surgery)

## 2011-01-23 ENCOUNTER — Inpatient Hospital Stay (HOSPITAL_COMMUNITY): Payer: 59

## 2011-01-23 LAB — CBC
MCHC: 33.6 g/dL (ref 30.0–36.0)
Platelets: 123 10*3/uL — ABNORMAL LOW (ref 150–400)
RDW: 15.1 % (ref 11.5–15.5)

## 2011-01-23 LAB — TYPE AND SCREEN
ABO/RH(D): O POS
Unit division: 0
Unit division: 0

## 2011-01-23 LAB — BASIC METABOLIC PANEL
BUN: 21 mg/dL (ref 6–23)
Creatinine, Ser: 1.31 mg/dL — ABNORMAL HIGH (ref 0.50–1.10)
GFR calc Af Amer: 42 mL/min — ABNORMAL LOW (ref >90)
GFR calc non Af Amer: 36 mL/min — ABNORMAL LOW (ref >90)

## 2011-01-23 LAB — GLUCOSE, CAPILLARY

## 2011-01-23 MED ORDER — ONDANSETRON HCL 4 MG/2ML IJ SOLN
4.0000 mg | Freq: Four times a day (QID) | INTRAMUSCULAR | Status: DC | PRN
  Administered 2011-01-24 – 2011-01-28 (×7): 4 mg via INTRAVENOUS
  Filled 2011-01-23 (×8): qty 2

## 2011-01-23 MED ORDER — BISACODYL 5 MG PO TBEC
10.0000 mg | DELAYED_RELEASE_TABLET | Freq: Every day | ORAL | Status: DC | PRN
  Administered 2011-02-01: 10 mg via ORAL
  Filled 2011-01-23: qty 2

## 2011-01-23 MED ORDER — POTASSIUM CHLORIDE CRYS ER 20 MEQ PO TBCR
20.0000 meq | EXTENDED_RELEASE_TABLET | Freq: Every day | ORAL | Status: DC
  Filled 2011-01-23: qty 1

## 2011-01-23 MED ORDER — ACETAMINOPHEN 325 MG PO TABS
650.0000 mg | ORAL_TABLET | Freq: Four times a day (QID) | ORAL | Status: DC | PRN
Start: 2011-01-23 — End: 2011-02-10
  Administered 2011-01-27: 650 mg via ORAL
  Filled 2011-01-23: qty 2

## 2011-01-23 MED ORDER — WARFARIN VIDEO: Freq: Once | Status: DC

## 2011-01-23 MED ORDER — WARFARIN SODIUM 1 MG PO TABS
1.0000 mg | ORAL_TABLET | Freq: Every day | ORAL | Status: AC
  Administered 2011-01-23: 1 mg via ORAL
  Filled 2011-01-23: qty 1

## 2011-01-23 MED ORDER — ONDANSETRON HCL 4 MG PO TABS
4.0000 mg | ORAL_TABLET | Freq: Four times a day (QID) | ORAL | Status: DC | PRN
  Administered 2011-01-23 – 2011-02-09 (×5): 4 mg via ORAL
  Filled 2011-01-23 (×6): qty 1

## 2011-01-23 MED ORDER — SODIUM CHLORIDE 0.9 % IV SOLN
250.0000 mL | INTRAVENOUS | Status: DC | PRN
  Administered 2011-01-26 – 2011-02-05 (×4): 250 mL via INTRAVENOUS

## 2011-01-23 MED ORDER — ASPIRIN 81 MG PO CHEW
81.0000 mg | CHEWABLE_TABLET | Freq: Every day | ORAL | Status: DC
  Administered 2011-01-23 – 2011-01-25 (×3): 81 mg via ORAL
  Filled 2011-01-23 (×4): qty 1

## 2011-01-23 MED ORDER — OXYCODONE HCL 5 MG PO TABS
5.0000 mg | ORAL_TABLET | ORAL | Status: DC | PRN
  Administered 2011-01-23 (×3): 5 mg via ORAL
  Administered 2011-01-24: 10 mg via ORAL
  Administered 2011-01-24: 5 mg via ORAL
  Filled 2011-01-23 (×4): qty 1
  Filled 2011-01-23: qty 2

## 2011-01-23 MED ORDER — TRAMADOL HCL 50 MG PO TABS
50.0000 mg | ORAL_TABLET | ORAL | Status: DC | PRN
  Administered 2011-01-24: 50 mg via ORAL
  Administered 2011-01-24 – 2011-01-29 (×14): 100 mg via ORAL
  Administered 2011-01-30: 50 mg via ORAL
  Administered 2011-01-30 – 2011-01-31 (×4): 100 mg via ORAL
  Administered 2011-01-31: 50 mg via ORAL
  Administered 2011-02-01 – 2011-02-05 (×20): 100 mg via ORAL
  Administered 2011-02-05: 50 mg via ORAL
  Administered 2011-02-06 – 2011-02-10 (×17): 100 mg via ORAL
  Filled 2011-01-23 (×2): qty 2
  Filled 2011-01-23: qty 1
  Filled 2011-01-23 (×15): qty 2
  Filled 2011-01-23: qty 1
  Filled 2011-01-23 (×18): qty 2
  Filled 2011-01-23: qty 1
  Filled 2011-01-23 (×22): qty 2

## 2011-01-23 MED ORDER — DOCUSATE SODIUM 100 MG PO CAPS
200.0000 mg | ORAL_CAPSULE | Freq: Every day | ORAL | Status: DC
  Administered 2011-01-23 – 2011-02-10 (×17): 200 mg via ORAL
  Filled 2011-01-23 (×19): qty 2

## 2011-01-23 MED ORDER — FUROSEMIDE 40 MG PO TABS
40.0000 mg | ORAL_TABLET | Freq: Every day | ORAL | Status: DC
  Administered 2011-01-24 – 2011-02-03 (×11): 40 mg via ORAL
  Filled 2011-01-23 (×11): qty 1

## 2011-01-23 MED ORDER — PANTOPRAZOLE SODIUM 40 MG PO TBEC
40.0000 mg | DELAYED_RELEASE_TABLET | Freq: Every day | ORAL | Status: DC
  Administered 2011-01-24 – 2011-02-10 (×17): 40 mg via ORAL
  Filled 2011-01-23 (×14): qty 1

## 2011-01-23 MED ORDER — ROSUVASTATIN CALCIUM 40 MG PO TABS
40.0000 mg | ORAL_TABLET | Freq: Every day | ORAL | Status: DC
  Administered 2011-01-23: 40 mg via ORAL
  Filled 2011-01-23 (×2): qty 1

## 2011-01-23 MED ORDER — ALUM & MAG HYDROXIDE-SIMETH 400-400-40 MG/5ML PO SUSP
15.0000 mL | ORAL | Status: DC | PRN
  Filled 2011-01-23: qty 30

## 2011-01-23 MED ORDER — MOVING RIGHT ALONG BOOK
Freq: Once | Status: AC
  Administered 2011-01-23: 12:00:00
  Filled 2011-01-23: qty 1

## 2011-01-23 MED ORDER — POVIDONE-IODINE 10 % EX SOLN
1.0000 "application " | Freq: Two times a day (BID) | CUTANEOUS | Status: DC
  Administered 2011-01-23 – 2011-02-09 (×35): 1 via TOPICAL
  Filled 2011-01-23: qty 15

## 2011-01-23 MED ORDER — PATIENT'S GUIDE TO USING COUMADIN BOOK
Freq: Once | Status: AC
  Administered 2011-01-23: 14:00:00
  Filled 2011-01-23: qty 1

## 2011-01-23 MED ORDER — FUROSEMIDE 10 MG/ML IJ SOLN
20.0000 mg | Freq: Two times a day (BID) | INTRAMUSCULAR | Status: AC
  Administered 2011-01-23 (×2): 20 mg via INTRAVENOUS
  Filled 2011-01-23: qty 2

## 2011-01-23 MED ORDER — BISACODYL 10 MG RE SUPP
10.0000 mg | Freq: Every day | RECTAL | Status: DC | PRN
  Filled 2011-01-23: qty 1

## 2011-01-23 MED ORDER — FUROSEMIDE 10 MG/ML IJ SOLN
INTRAMUSCULAR | Status: AC
  Administered 2011-01-23: 20 mg via INTRAVENOUS
  Filled 2011-01-23: qty 4

## 2011-01-23 MED ORDER — AMIODARONE HCL 200 MG PO TABS
200.0000 mg | ORAL_TABLET | Freq: Every day | ORAL | Status: DC
  Administered 2011-01-23 – 2011-01-24 (×2): 200 mg via ORAL
  Filled 2011-01-23 (×2): qty 1

## 2011-01-23 MED ORDER — SODIUM CHLORIDE 0.9 % IJ SOLN
3.0000 mL | Freq: Two times a day (BID) | INTRAMUSCULAR | Status: DC
  Administered 2011-01-23 – 2011-01-25 (×5): 3 mL via INTRAVENOUS

## 2011-01-23 MED ORDER — SODIUM CHLORIDE 0.9 % IJ SOLN: 3.0000 mL | INTRAMUSCULAR | Status: DC | PRN

## 2011-01-23 MED ORDER — MAGNESIUM HYDROXIDE 400 MG/5ML PO SUSP
30.0000 mL | Freq: Every day | ORAL | Status: DC | PRN
  Administered 2011-02-07: 30 mL via ORAL
  Filled 2011-01-23: qty 30

## 2011-01-23 MED FILL — Magnesium Sulfate Inj 50%: INTRAMUSCULAR | Qty: 10 | Status: AC

## 2011-01-23 MED FILL — Potassium Chloride Inj 2 mEq/ML: INTRAVENOUS | Qty: 40 | Status: AC

## 2011-01-23 NOTE — Progress Notes (Signed)
   CARDIOTHORACIC SURGERY PROGRESS NOTE   R2 Days Post-Op Procedure(s) (LRB): MITRAL VALVE REPAIR (MVR) (N/A)  Subjective: Still feels sore but improved. Otherwise doing well.  Objective: Vital signs: Filed Vitals:   01/23/11 0800  BP: 82/38  Pulse: 70  Temp:   Resp: 20    Hemodynamics: PAP: (34)/(17) 34/17 mmHg  Physical Exam:  Rhythm:   Sinus with HR 50-60, AAI paced  Breath sounds: Diminished at bases  Heart sounds:  RRR no murmur  Incisions:  dry  Abdomen:  soft  Extremities:  warm   Intake/Output from previous day: 11/29 0701 - 11/30 0700 In: 1317.5 [P.O.:540; I.V.:575.5; IV Piggyback:202] Out: 995 [Urine:565; Chest Tube:430] Intake/Output this shift: Total I/O In: 20 [I.V.:20] Out: -   Lab Results:  Basename 01/23/11 0443 01/22/11 1730  WBC 11.4* 17.0*  HGB 9.8* 10.4*  HCT 29.2* 31.5*  PLT 123* 149*   BMET:  Basename 01/23/11 0443 01/22/11 1730 01/22/11 1728 01/22/11 0330  NA 135 -- 135 --  K 4.8 -- 4.7 --  CL 103 -- 106 --  CO2 23 -- -- 23  GLUCOSE 111* -- 123* --  BUN 21 -- 21 --  CREATININE 1.31* 1.15* -- --  CALCIUM 8.6 -- -- 8.0*    CBG (last 3)   Basename 01/23/11 0339 01/22/11 2355 01/22/11 1939  GLUCAP 102* 117* 161*   ABG    Component Value Date/Time   PHART 7.325* 01/21/2011 2123   HCO3 24.3* 01/21/2011 2123   TCO2 22 01/22/2011 1728   ACIDBASEDEF 2.0 01/21/2011 2123   O2SAT 100.0 01/21/2011 2123    CXR: Clear except left basilar opacity c/w atelectasis +/- effusion   Assessment/Plan: S/P Procedure(s) (LRB): MITRAL VALVE REPAIR (MVR) (N/A)  Doing well POD #2 overall Mild acute renal insufficiency Mobilize D/C tubes Start coumadin slowly AAI pace for now Hold beta blocker for now Transfer step down   Liz Claiborne

## 2011-01-23 NOTE — Progress Notes (Signed)
CARDIAC REHAB PHASE I   PRE:  Rate/Rhythm: 70 Pacing  BP:  Supine: 97/46  Sitting:   Standing:    SaO2: 91 RA  MODE:  Ambulation: 30 ft   POST:  Rate/Rhythem: 70 Pacing  BP:  Supine:   Sitting: 75/53  Standing:    SaO2: 93 RA 1419-1555 Assisted X 2 and used walker to ambulate. Pt very weak unsteady. Only able to get her 30 ft. She was dizzy, had to sit in chair in hall, and rolled back to room. BP in recliner 75/53 pale sweaty. Got  her something to drink and feet up BP 101/35. Reported to her nurse. Family in room and call light in reach.  Desiree [name]

## 2011-01-24 ENCOUNTER — Inpatient Hospital Stay (HOSPITAL_COMMUNITY): Payer: 59

## 2011-01-24 LAB — BASIC METABOLIC PANEL
BUN: 30 mg/dL — ABNORMAL HIGH (ref 6–23)
Chloride: 101 mEq/L (ref 96–112)
GFR calc Af Amer: 32 mL/min — ABNORMAL LOW (ref >90)
GFR calc non Af Amer: 31 mL/min — ABNORMAL LOW (ref >90)
Glucose, Bld: 135 mg/dL — ABNORMAL HIGH (ref 70–99)
Glucose, Bld: 136 mg/dL — ABNORMAL HIGH (ref 70–99)
Potassium: 5 mEq/L (ref 3.5–5.1)
Potassium: 4.9 mEq/L (ref 3.5–5.1)
Sodium: 133 mEq/L — ABNORMAL LOW (ref 135–145)

## 2011-01-24 LAB — CBC
HCT: 31.8 % — ABNORMAL LOW (ref 36.0–46.0)
Hemoglobin: 10.4 g/dL — ABNORMAL LOW (ref 12.0–15.0)
RBC: 3.4 MIL/uL — ABNORMAL LOW (ref 3.87–5.11)
RDW: 15 % (ref 11.5–15.5)
WBC: 10.5 10*3/uL (ref 4.0–10.5)

## 2011-01-24 MED ORDER — ATORVASTATIN CALCIUM 80 MG PO TABS
80.0000 mg | ORAL_TABLET | Freq: Every day | ORAL | Status: DC
  Administered 2011-01-24 – 2011-01-31 (×8): 80 mg via ORAL
  Filled 2011-01-24 (×9): qty 1

## 2011-01-24 MED ORDER — ENSURE CLINICAL ST REVIGOR PO LIQD
237.0000 mL | Freq: Three times a day (TID) | ORAL | Status: DC
  Administered 2011-01-24: 18:00:00 via ORAL
  Administered 2011-01-25: 237 mL via ORAL
  Administered 2011-01-25: 12:00:00 via ORAL
  Administered 2011-01-26 – 2011-01-30 (×10): 237 mL via ORAL
  Administered 2011-01-31 – 2011-02-03 (×4): via ORAL
  Administered 2011-02-04: 237 mL via ORAL
  Administered 2011-02-04: 11:00:00 via ORAL
  Administered 2011-02-05 – 2011-02-09 (×5): 237 mL via ORAL

## 2011-01-24 MED ORDER — WARFARIN SODIUM 1 MG PO TABS
1.0000 mg | ORAL_TABLET | Freq: Every day | ORAL | Status: DC
  Administered 2011-01-24: 1 mg via ORAL
  Filled 2011-01-24 (×2): qty 1

## 2011-01-24 MED ORDER — LACTULOSE 10 GM/15ML PO SOLN
20.0000 g | Freq: Once | ORAL | Status: AC
  Administered 2011-01-24: 20 g via ORAL
  Filled 2011-01-24: qty 30

## 2011-01-24 MED ORDER — LACTULOSE 10 GM/15ML PO SOLN
30.0000 g | Freq: Once | ORAL | Status: DC
  Filled 2011-01-24: qty 45

## 2011-01-24 NOTE — Progress Notes (Signed)
PT/OT/SLP Cancellation Note  Treatment cancelled today due to medical issues with patient which prohibited therapy. Pt with orthostatic BP limiting mobility and participation. Spoke with PT Maija: whom just finished PT eval and reports OT to reattempt at later date /time. (see PT eval progress note)  OT to continue to follow acutely and reattempt eval.   Lucile Shutters   OTR/L Pager: [phone] Office: [phone] .

## 2011-01-24 NOTE — Progress Notes (Signed)
Cardiac Rehab 1325 Pt just worked with PT. We will followup  Monday. Desiree [name] DunlapRN

## 2011-01-24 NOTE — Progress Notes (Signed)
Seen and agree with SPT note Orthostatic BPs  Supine 134/58 after activity  Sitting 96/45 EOB  Sitting after 2 min 96/34 EOB after stand pivot  Standing Pt unable to stand long enough to get BP      Toney Sang, PT [phone]

## 2011-01-24 NOTE — Progress Notes (Signed)
Physical Therapy Evaluation Patient Details Name: Desiree [name] MRN: [id number] DOB: 29-Mar-1924 Today's Date: 01/24/2011  Problem List:  Patient Active Problem List  Diagnoses  . Hypertensive heart disease without CHF  . Diverticulosis  . Anemia, mild  . Lumbar disc disease  . Carotid artery disease  . Hypothyroidism  . Hyperlipidemia  . S/P mitral valve repair    Past Medical History:  Past Medical History  Diagnosis Date  . Diverticulosis     chronic issues with consitpation and diarrhea  . Carotid artery disease   . Hypercholesterolemia   . Osteoarthritis     chronic-on multiple pain meds.  . Paroxysmal atrial fibrillation     not on coumadin-Cards=[facility]-saw him ?1 yr ago  . CHF (congestive heart failure), NYHA class II     has sudden onset decom CHF? 2/2 to PNA  . Lumbar disc disease   . Hypertensive heart disease without CHF   . Lumbar disc disease   . Heart murmur   . Pneumonia     few times  . Hypothyroidism   . Anemia   . Hypertension   . Mitral regurgitation   . Urinary tract infection     hx of UTI   Past Surgical History:  Past Surgical History  Procedure Date  . Carpal tunnel release     rt  . Rotator cuff repair     2 on right, 1 on left  . Abdominal hysterectomy   . Cataract extraction     bil  . Knee surgery     bilateral arthroscopy  . Tee without cardioversion 01/13/2011    Procedure: TRANSESOPHAGEAL ECHOCARDIOGRAM (TEE);  Surgeon: Wendall Stade, MD;  Location: [facility] ENDOSCOPY;  Service: Cardiovascular;  Laterality: Left;  . Cardiac catheterization 01/14/11  . Carotid endarterectomy     rt  . Mitral valve repair 01/21/2011    Procedure: MITRAL VALVE REPAIR (MVR);  Surgeon: Purcell Nails, MD;  Location: [facility] OR;  Service: Open Heart Surgery;  Laterality: N/A;    PT Assessment/Plan/Recommendation PT Assessment Clinical Impression Statement: Patient s/p mitral valve replacement with decreased activity tolerance and independence.   Orthostatic hypotension currently limiting mobility efforts.  Pt. agreeable to SNF at discharge and will benefit from PT in the acute setting to decrease burden of care at [facility].   PT Recommendation/Assessment: Patient will need skilled PT in the acute care venue PT Problem List: Decreased strength;Decreased activity tolerance;Decreased mobility;Decreased knowledge of use of DME;Decreased knowledge of precautions;Cardiopulmonary status limiting activity;Pain Barriers to Discharge: None PT Therapy Diagnosis : Difficulty walking;Abnormality of gait;Generalized weakness PT Plan PT Frequency: Min 3X/week PT Treatment/Interventions: DME instruction;Gait training;Functional mobility training;Patient/family education;Therapeutic activities PT Recommendation Follow Up Recommendations: Skilled nursing facility Equipment Recommended: Defer to next venue PT Goals  Acute Rehab PT Goals PT Goal Formulation: With patient Pt will Roll Supine to Right Side: with modified independence PT Goal: Rolling Supine to Right Side - Progress: Other (comment) Pt will go Supine/Side to Sit: with modified independence PT Goal: Supine/Side to Sit - Progress: Other (comment) Pt will go Sit to Supine/Side: with min assist PT Goal: Sit to Supine/Side - Progress: Other (comment) Pt will go Sit to Stand: with supervision PT Goal: Sit to Stand - Progress: Other (comment) Pt will go Stand to Sit: with supervision PT Goal: Stand to Sit - Progress: Other (comment) Pt will Transfer Bed to Chair/Chair to Bed: with supervision PT Transfer Goal: Bed to Chair/Chair to Bed - Progress: Other (comment) Pt will Ambulate:  51 - 150 feet;with supervision;with rolling walker PT Goal: Ambulate - Progress: Other (comment)  PT Evaluation Precautions/Restrictions  Precautions Precautions: Sternal;Fall (Precaution sheet issued) Prior Functioning  Home Living Lives With: Alone Type of Home: House Home Layout: One level Home Access:  Stairs to enter Entrance Stairs-Rails: Can reach both;Right (Both at front, right at rear entrance) Entrance Stairs-Number of Steps: 2 at back and 5 at front entrance [employer] Shower/Tub: Health visitor: Standard Home Adaptive Equipment: Raised toilet seat with rails;Shower chair without back Prior Function Level of Independence: Independent with basic ADLs;Independent with gait;Independent with transfers;Independent with homemaking with ambulation Driving: Yes Vocation:  Pension scheme manager) Cognition Cognition Arousal/Alertness: Awake/alert Orientation Level: Oriented X4 Sensation/Coordination   Extremity Assessment RLE Strength RLE Overall Strength: Deficits LLE Strength LLE Overall Strength: Deficits Mobility (including Balance) Bed Mobility Bed Mobility: Yes Rolling Right: 4: Min assist Rolling Right Details (indicate cue type and reason): Cues for sequencing and initiation.  Assist to upper extremities.  Right Sidelying to Sit: 3: Mod assist;HOB flat Right Sidelying to Sit Details (indicate cue type and reason): Cues for sequencing and to bring legs off edge of bed, cueing  and assist at shoulder and hips to go sidelying to sit.  Sitting - Scoot to Edge of Bed: 2: Max assist Sitting - Scoot to [employer] of Bed Details (indicate cue type and reason): Pt. unable to scoot and maintain sternal precautions.  Requiring tactile cueing to scoot hips and assist due to weakness and pain.  Sit to Supine - Right: HOB flat;1: +1 Total assist Sit to Supine - Right Details (indicate cue type and reason): Assist for upper and lower extremities.  Transfers Transfers: Yes Sit to Stand: 3: Mod assist;With upper extremity assist;From bed;From chair/3-in-1 Sit to Stand Details (indicate cue type and reason): Cues to scoot to edge, assist for anterior translation.  Stand to Sit: 3: Mod assist;To chair/3-in-1;To bed Stand to Sit Details: Pt. trying to sit prematurely without backing up to  edge of bedside commode/bed.  Stand Pivot Transfers: 3: Mod assist Stand Pivot Transfer Details (indicate cue type and reason): Cues for initiation of stepping, assist for balance.  Ambulation/Gait Ambulation/Gait: No (Unable to attempt due to orthostatic hypotension. )    Exercise    End of Session PT - End of Session Activity Tolerance: Patient limited by fatigue;Treatment limited secondary to medical complications (Comment) (Orthostatic hypotension) Patient left: in bed;with call bell in reach;with family/visitor present Nurse Communication: Mobility status for transfers General Behavior During Session: [facility] for tasks performed Cognition: [facility] for tasks performed  Laney Pastor, SPT  01/24/2011, 1:28 PM

## 2011-01-24 NOTE — Progress Notes (Addendum)
3 Days Post-Op Procedure(s) (LRB): MITRAL VALVE REPAIR (MVR) (N/A)  Subjective: Patient does not feel that well this am.  She is "sick to her stomach" but denies abdominal pain.  She reports she is very tired as well.  Objective: Vital signs in last 24 hours: Patient Vitals for the past 24 hrs:  BP Temp Temp src Pulse Resp SpO2 Height Weight  01/24/11 0424 108/64 mmHg 98.3 F (36.8 C) Oral 70  22  94 % - 146 lb 9.7 oz (66.5 kg)  01/23/11 1952 86/56 mmHg 96.7 F (35.9 C) Oral 69  24  95 % - -  01/23/11 1359 91/43 mmHg 98 F (36.7 C) Oral 70  17  93 % - -  01/23/11 1219 101/44 mmHg 98 F (36.7 C) Oral 70  16  94 % 4\' 11"  (1.499 m) 145 lb 1 oz (65.8 kg)  01/23/11 1100 91/45 mmHg - - 70  19  93 % - -  01/23/11 1000 78/45 mmHg - - 70  24  92 % - -  01/23/11 0900 84/43 mmHg - - 70  19  94 % - -  01/23/11 0814 - - - - - 0 % - -  01/23/11 0800 82/38 mmHg - - 70  20  99 % - -   Pre op weight  56.2 kg Current Weight  01/24/11 146 lb 9.7 oz (66.5 kg)      Intake/Output from previous day: 11/30 0701 - 12/01 0700 In: 260 [P.O.:240; I.V.:20] Out: 450 [Urine:340; Chest Tube:110]   Physical Exam:  Cardiovascular: RRR, no murmurs, gallops, or rubs. Pulmonary: Clear to auscultation bilaterally; no rales, wheezes, or rhonchi. Abdomen: Soft, non tender, distended, hypoactive bowels sounds. Extremities: SCDs on bilateral lower extremities. Wound: Clean and dry.  No erythema or signs of infection.  Lab Results: CBC: Basename 01/24/11 0625 01/23/11 0443  WBC 10.5 11.4*  HGB 10.4* 9.8*  HCT 31.8* 29.2*  PLT 125* 123*   BMET:  Basename 01/24/11 0625 01/23/11 0443  NA 133* 135  K 5.0 4.8  CL 101 103  CO2 23 23  GLUCOSE 135* 111*  BUN 30* 21  CREATININE 1.66* 1.31*  CALCIUM 8.6 8.6    PT/INR:  Basename 01/24/11 0625  LABPROT 17.7*  INR 1.43   ABG:  INR: Will add last result for INR, ABG once components are confirmed Will add last 4 CBG results once components are  confirmed  Assessment/Plan:  1. CV - AAI paced at 70. Continue Amiodarone 200 daily, Coumadin. 2.  Pulmonary - Encourage incentive spirometer,flutter valve. Wean O2 as tolerates. 3. Volume Overload - Being diuresed with Lasix 40 daily. 4.  Acute blood loss anemia - H/H increased from 9.8/29.2 to 10.4/31.8. 5.Thrombocytopenia-Platelets stable at 125,000. 6.GI-Nausea and decreased appetite this am. LOC for constipation,Zofran PRN nausea. Nurse reports recently gave her 2 oxycodone-nausea possibly related to this. Will stop Oxy and try Ultram. 7.Creatnine increased from 1.3 to 1.6. Will continue with daily Lasix as is volume overlaoded and re check BMET in am.  Ardelle Balls, PA 01/24/2011   I have seen and examined the patient and agree with the assessment and plan as outlined.  Will stop amiodarone due to continued nausea.  Add dietary supplements.  OWEN,CLARENCE H 01/24/2011 1:07 PM

## 2011-01-25 ENCOUNTER — Inpatient Hospital Stay (HOSPITAL_COMMUNITY): Payer: 59

## 2011-01-25 ENCOUNTER — Other Ambulatory Visit: Payer: Self-pay

## 2011-01-25 DIAGNOSIS — I2699 Other pulmonary embolism without acute cor pulmonale: Secondary | ICD-10-CM | POA: Diagnosis not present

## 2011-01-25 LAB — PROTIME-INR: INR: 1.31 (ref 0.00–1.49)

## 2011-01-25 LAB — GLUCOSE, CAPILLARY: Glucose-Capillary: 137 mg/dL — ABNORMAL HIGH (ref 70–99)

## 2011-01-25 MED ORDER — SODIUM CHLORIDE 0.9 % IJ SOLN
10.0000 mL | Freq: Two times a day (BID) | INTRAMUSCULAR | Status: DC
  Administered 2011-01-25 – 2011-01-30 (×10): 10 mL
  Administered 2011-01-31 – 2011-02-01 (×2): 3 mL
  Administered 2011-02-05: 10 mL
  Administered 2011-02-06: 3 mL
  Administered 2011-02-06 – 2011-02-07 (×2): 10 mL

## 2011-01-25 MED ORDER — DEXTROSE 5 % IV SOLN: 150.0000 mg | Freq: Once | INTRAVENOUS | Status: DC

## 2011-01-25 MED ORDER — DEXTROSE 5 % IV SOLN
60.0000 mg/h | INTRAVENOUS | Status: AC
  Administered 2011-01-25: 60 mg/h via INTRAVENOUS
  Filled 2011-01-25: qty 9

## 2011-01-25 MED ORDER — DEXTROSE 5 % IV SOLN
30.0000 mg/h | INTRAVENOUS | Status: DC
  Filled 2011-01-25: qty 9

## 2011-01-25 MED ORDER — WARFARIN SODIUM 2 MG PO TABS
2.0000 mg | ORAL_TABLET | Freq: Every day | ORAL | Status: DC
  Administered 2011-01-25 – 2011-01-29 (×5): 2 mg via ORAL
  Filled 2011-01-25 (×6): qty 1

## 2011-01-25 MED ORDER — SODIUM CHLORIDE 0.9 % IJ SOLN
10.0000 mL | INTRAMUSCULAR | Status: DC | PRN
  Administered 2011-01-26 – 2011-02-10 (×26): 10 mL

## 2011-01-25 MED ORDER — DEXTROSE 5 % IV SOLN
60.0000 mg/h | INTRAVENOUS | Status: DC
  Filled 2011-01-25: qty 9

## 2011-01-25 MED ORDER — METOPROLOL TARTRATE 25 MG PO TABS
25.0000 mg | ORAL_TABLET | Freq: Two times a day (BID) | ORAL | Status: DC
  Administered 2011-01-25: 25 mg via ORAL

## 2011-01-25 MED ORDER — METOPROLOL TARTRATE 25 MG PO TABS
25.0000 mg | ORAL_TABLET | Freq: Two times a day (BID) | ORAL | Status: DC
  Administered 2011-01-25 – 2011-02-08 (×25): 25 mg via ORAL
  Filled 2011-01-25 (×32): qty 1

## 2011-01-25 MED ORDER — DEXTROSE 5 % IV SOLN
150.0000 mg | Freq: Once | INTRAVENOUS | Status: DC
  Administered 2011-01-25: 150 mg via INTRAVENOUS
  Filled 2011-01-25: qty 3

## 2011-01-25 MED ORDER — DEXTROSE 5 % IV SOLN
30.0000 mg/h | INTRAVENOUS | Status: DC
  Administered 2011-01-26 – 2011-01-27 (×4): 30 mg/h via INTRAVENOUS
  Filled 2011-01-25 (×4): qty 9

## 2011-01-25 NOTE — Progress Notes (Addendum)
MEDICATION RELATED CONSULT NOTE - INITIAL   Pharmacy Consult to evaluate Drug-Drug Interactions with Amiodarone  77 y.o. F restarted on amiodarone drip today for Afib. The patient is on the medications listed below. Potential interactions and dose adjustments to be aware of are as follows:  1. Amiodarone-Warfarin (Class D interaction)--Amiodarone may enhance the anticoagulant effects of warfarin. Recommendation is for an empiric warfarin dosage reduction of 30-50% at the initiation of amiodarone 2. Amiodarone-Lipitor (Class D interaction)--Amiodarone may decrease the metabolism of statins. Recommendation is to reduce the dose of the statin to help reduce toxicity of statins (i.e. Rhabdomylosis) or switch to non-interacting statin (i.e. Pravachol). Consider monitoring LFTs and for myalgia 3. Amiodarone-Lopressor (Class C interaction)--Amiodarone may increase the concentration and enhance the bradycardic effects of beta-blockers. Recommendation is to monitor for s/sx of bradycardia.   Pharmacy will sign off at this time regarding this consult--let us know if you have any further questions regarding drug interactions.  Thanks! Georgina Pillion, PharmD  01/25/2011 5:42 PM   Medications:  Prescriptions prior to admission  Medication Sig Dispense Refill  . amiodarone (PACERONE) 200 MG tablet Take 1 tablet (200 mg total) by mouth 2 (two) times daily.  60 tablet  0  . aspirin 81 MG tablet Take 81 mg by mouth daily.       Marland Kitchen atorvastatin (LIPITOR) 80 MG tablet Take 80 mg by mouth at bedtime.       . furosemide (LASIX) 40 MG tablet Take 1 tablet (40 mg total) by mouth daily.  30 tablet  1  . HYDROcodone-acetaminophen (NORCO) 5-325 MG per tablet Take 1 tablet by mouth every 6 (six) hours as needed. For pain.       Marland Kitchen levothyroxine (SYNTHROID, LEVOTHROID) 75 MCG tablet Take 75 mcg by mouth daily.       . metoprolol (LOPRESSOR) 50 MG tablet Take 1 tablet (50 mg total) by mouth 2 (two) times daily.  30  tablet  1  . spironolactone (ALDACTONE) 25 MG tablet Take 1 tablet (25 mg total) by mouth daily.  30 tablet  1  . Polyethyl Glycol-Propyl Glycol (SYSTANE OP) Apply 2 drops to eye 4 (four) times daily as needed. For dry eyes.      . polyethylene glycol (MIRALAX / GLYCOLAX) packet Take 17 g by mouth daily.         Scheduled:    . amiodarone (CORDARONE) IV bolus only 150 mg/100 mL  150 mg Intravenous Once  . aspirin  81 mg Oral Daily  . atorvastatin  80 mg Oral q1800  . docusate sodium  200 mg Oral Daily  . feeding supplement  237 mL Oral TID WC  . furosemide  40 mg Oral Daily  . levothyroxine  75 mcg Oral Daily  . metoprolol tartrate  25 mg Oral BID  . mupirocin ointment   Nasal BID  . pantoprazole  40 mg Oral QAC breakfast  . povidone-iodine  1 application Topical BID  . sodium chloride  10 mL Intracatheter Q12H  . sodium chloride  3 mL Intravenous Q12H  . warfarin  2 mg Oral q1800  . warfarin   Does not apply Once  . DISCONTD: amiodarone (CORDARONE) IV bolus only 150 mg/100 mL  150 mg Intravenous Once  . DISCONTD: metoprolol tartrate  25 mg Oral BID  . DISCONTD: warfarin  1 mg Oral q1800

## 2011-01-25 NOTE — Progress Notes (Signed)
OT Cancellation Note  _x__Treatment cancelled today due to medical issues with patient which prohibited therapy- per RN, pt still in a-fib. Will re-check as schedule permits  ___ Treatment cancelled today due to patient receiving procedure or test   ___ Treatment cancelled today due to patient's refusal to participate   ___ Treatment cancelled today due to   Signature: Garrel Ridgel, OTR/L [phone]

## 2011-01-25 NOTE — Progress Notes (Signed)
01/25/2011 4:22 PM Nursing Note: Patient unable to complete walks today for day shift RN related to extreme weakness, Atrial Fib and PICC line placement. Encouraged patient to attempt walk later this pm.  Keniah Klemmer, Blanchard Kelch

## 2011-01-25 NOTE — Progress Notes (Signed)
01/25/2011 1730 Nursing Note: Patient back in Afib rate 106. Dr. Cornelius Moras made aware. Orders received. Amiodarone infusion initiated per orders and per protocol. Will continue to closely monitor patient rate and rhythm.  Andron Marrazzo, Blanchard Kelch

## 2011-01-25 NOTE — Progress Notes (Signed)
01/25/2011 6:37 PM Nursing note:  Patient phone in room unplugged per patient request. Informed patient to let RN know when she would like it plugged back in. Will continue to closely monitor patient.  Maahi Lannan, Blanchard Kelch

## 2011-01-25 NOTE — Progress Notes (Signed)
01/25/2011 7:21 PM Nursing Note: Patient back in NSR/SB 59-62. BP 94/59. Initiated AAI at 70 per verbal order Dr. Cornelius Moras earlier in shift. Oncoming RN at bedside and will continue to monitor patient rate and rhythm. Amiodarone infusion continued per protocol orders.  Desiree [name], Desiree [name]

## 2011-01-25 NOTE — Progress Notes (Addendum)
4 Days Post-Op Procedure(s) (LRB): MITRAL VALVE REPAIR (MVR) (N/A)  Subjective: Patient still with nausea and no bm.  Does not have much appetite-mostly drinking ensure. Denies abdominal pain or emesis.  Objective: Vital signs in last 24 hours: Patient Vitals for the past 24 hrs:  BP Temp Temp src Pulse Resp SpO2 Weight  01/25/11 0628 - - - - - - 134 lb 11.2 oz (61.1 kg)  01/25/11 0425 100/64 mmHg 97.9 F (36.6 C) Oral 76  18  90 % -  01/24/11 2057 100/61 mmHg 98 F (36.7 C) Oral 75  18  92 % -  01/24/11 1427 114/51 mmHg 98.6 F (37 C) Oral 70  20  95 % -  01/24/11 1301 134/58 mmHg - - - - - -  01/24/11 1256 98/42 mmHg - - 72  - 94 % -  01/24/11 1251 96/34 mmHg - - - - - -  01/24/11 1243 96/45 mmHg - - - - - -   Pre op weight  56.2 kg Current Weight  01/25/11 134 lb 11.2 oz (61.1 kg)      Intake/Output from previous day: 12/01 0701 - 12/02 0700 In: 720 [P.O.:720] Out: 750 [Urine:750]   Physical Exam:  Cardiovascular: RRR, no murmurs, gallops, or rubs. Pulmonary: Clear to auscultation bilaterally; no rales, wheezes, or rhonchi. Abdomen: Soft, non tender, distended, hypoactive bowels sounds. Extremities: SCDs on bilateral lower extremities. Wound: Clean and dry.  No erythema or signs of infection.  Lab Results: CBC:  Basename 01/24/11 0625 01/23/11 0443  WBC 10.5 11.4*  HGB 10.4* 9.8*  HCT 31.8* 29.2*  PLT 125* 123*   BMET:   Basename 01/24/11 1000 01/24/11 0625  NA 133* 133*  K 4.9 5.0  CL 101 101  CO2 22 23  GLUCOSE 136* 135*  BUN 30* 30*  CREATININE 1.50* 1.66*  CALCIUM 8.7 8.6    PT/INR:   Basename 01/25/11 0615  LABPROT 16.5*  INR 1.31   ABG:  INR: Will add last result for INR, ABG once components are confirmed Will add last 4 CBG results once components are confirmed  Assessment/Plan:  1. CV - SR.Will give Coumadin 2 mg tonight as INR slightly decreased and not on Amiodarone. 2.  Pulmonary - Encourage incentive spirometer,flutter valve.  Wean O2 as tolerates. 3. Volume Overload -Good diuresis with  Lasix 40 daily. 4.  Acute blood loss anemia - H/H increased from 9.8/29.2 to 10.4/31.8. 5.Thrombocytopenia-Platelets stable at 125,000. 6.GI-Nausea and decreased appetite again  this am.Zofran PRN nausea. Not eating much so will decrease diet to full liquids.Will order KUB. 7.Last creatinine down from 1.66 to 1.5.  Ardelle Balls, PA 01/25/2011   ZIMMERMAN,DONIELLE M 01/25/2011 7:53 AM   I have seen and examined the patient and agree with the assessment and plan as outlined.  However, Mrs Codispoti just went into AF with HR 110-120.  Will start oral metoprolol and restart Amiodarone intravenously.  Needs PICC for Amiodarone - do not give via peripheral IV  OWEN,CLARENCE H 01/25/2011 12:32 PM

## 2011-01-26 ENCOUNTER — Inpatient Hospital Stay (HOSPITAL_COMMUNITY): Payer: 59

## 2011-01-26 LAB — BLOOD GAS, ARTERIAL
Acid-base deficit: 0.6 mmol/L (ref 0.0–2.0)
O2 Saturation: 93.1 %
TCO2: 24.3 mmol/L (ref 0–100)
pCO2 arterial: 36.1 mmHg (ref 35.0–45.0)
pO2, Arterial: 63.1 mmHg — ABNORMAL LOW (ref 80.0–100.0)

## 2011-01-26 LAB — HEPARIN LEVEL (UNFRACTIONATED): Heparin Unfractionated: 0.51 IU/mL (ref 0.30–0.70)

## 2011-01-26 MED ORDER — HEPARIN SOD (PORCINE) IN D5W 100 UNIT/ML IV SOLN
850.0000 [IU]/h | INTRAVENOUS | Status: DC
  Administered 2011-01-26 – 2011-01-29 (×4): 850 [IU]/h via INTRAVENOUS
  Filled 2011-01-26 (×5): qty 250

## 2011-01-26 MED ORDER — IOHEXOL 300 MG/ML  SOLN
80.0000 mL | Freq: Once | INTRAMUSCULAR | Status: AC | PRN
  Administered 2011-01-26: 80 mL via INTRAVENOUS

## 2011-01-26 MED ORDER — HEPARIN BOLUS VIA INFUSION
3000.0000 [IU] | Freq: Once | INTRAVENOUS | Status: AC
  Administered 2011-01-26: 3000 [IU] via INTRAVENOUS
  Filled 2011-01-26: qty 3000

## 2011-01-26 NOTE — Progress Notes (Signed)
   CARE MANAGEMENT NOTE 01/26/2011  Patient:  COTINA, FREEDMAN   Account Number:  [id number]  Date Initiated:  01/22/2011  Documentation initiated by:  [facility]  Subjective/Objective Assessment:   Post op MV repair.     Action/Plan:   PTA, PT INDEPENDENT, LIVES ALONE.  HAS SUPPORTIVE DAUGHTER.   Anticipated DC Date:  01/27/2011   Anticipated DC Plan:  SKILLED NURSING FACILITY  In-house referral  Clinical Social Worker      DC Planning Services  CM consult      Choice offered to / List presented to:             Status of service:  In process, will continue to follow Medicare Important Message given?   (If response is "NO", the following Medicare IM given date fields will be blank) Date Medicare IM given:   Date Additional Medicare IM given:    Discharge Disposition:    Per UR Regulation:  Reviewed for med. necessity/level of care/duration of stay  Comments:  01/26/11 Fernado Brigante,RN,BSN 1200 PT TRANSFERRED FROM 2000 BACK TO 2300 TODAY WITH ACUTE PULMONARY EMBOLUS.  WILL FOLLOW PROGRESS. Phone #[phone]  01/22/11 Laverta Harnisch,RN,BSN 1345 MET WITH PT AND DAUGHTER TO DISCUSS DC PLANS.  PT WILL NEED SHORT TERM SNF AT DISCHARGE FOR REHAB PRIOR TO DC HOME. WILL OBTAIN PT/OT CONSULTS WHEN ABLE TO TOLERATE.  REFERRAL TO CSW TO FACILITATE DC TO SNF WHEN MEDICALLY STABLE FOR DISCHARGE.  LIKELY WILL PREFER [facility] AS FIRST CHOICE, PER DAUGHTER. Phone #[phone]  01-22-11 59:15am Avie Arenas, RNBSN [phone] UR Completed.

## 2011-01-26 NOTE — Progress Notes (Signed)
Patient is very weak and it is challenging to move her from the bed to the bedside commode due to her weakness.  She does not bear a lot of weight on her legs and she is very nervous with two people assisting her.  She does a lot of moaning and said "I feel nauseated everytime you come in the room" to me and the nurse tech.  She does not put forth a lot of effort to try and move. Try to reassure and motivate her to move. Will continue to monitor.

## 2011-01-26 NOTE — Progress Notes (Signed)
TCTS BRIEF PROGRESS NOTE   CTA confirms acute pulmonary embolus.  Mrs Portillo is stable presently on 100% oxygen by face mask.  Will transfer back to SICU for monitoring.  OWEN,CLARENCE H 01/26/2011 11:06 AM

## 2011-01-26 NOTE — Progress Notes (Signed)
Desiree [name], OTR/L Pager: [phone] 01/26/2011 .

## 2011-01-26 NOTE — Progress Notes (Signed)
CARDIAC REHAB PHASE I   PRE:  Rate/Rhythm: 70 pacing    BP: sitting 103/47    SaO2: 74 3L, 85 6L  Came to ambulate pt. Pt c/o nausea and weakness. Sitting in chair with breakfast in front of her. Actually had phlegm in her mouth that she had been holding there for a while. Spit it out when we came to help her. SaO2 74 3L. Registering well on finger, HR accurate to monitor. Put on 6L and up minimally, 85 at highest. Medical sales representative came in. Left pt in their care. Pt c/o SOB when asked. [phone]  Harriet Masson CES, ACSM

## 2011-01-26 NOTE — Progress Notes (Signed)
Patient felt nauseated after using the bedside commode. Patient moved back to the bed and was given IV zofran.  Patient was also given sips of ginger ale.  Nausea was decreased. Will continue to monitor.

## 2011-01-26 NOTE — Progress Notes (Signed)
   CARDIOTHORACIC SURGERY PROGRESS NOTE  5 Days Post-Op  S/P Procedure(s) (LRB): MITRAL VALVE REPAIR (MVR) (N/A)  Subjective: Remains very weak.  Sudden onset decreased O2 sats this morning after having BM.  Back in sinus rhythm overnight since amiodarone restarted  Objective: Vital signs in last 24 hours: Temp:  [97.5 F (36.4 C)-98.7 F (37.1 C)] 97.9 F (36.6 C) (12/03 0434) Pulse Rate:  [59-111] 70  (12/03 0434) Cardiac Rhythm:  [-] Atrial paced (12/02 2000) Resp:  [16-18] 16  (12/03 0434) BP: (94-114)/(50-65) 108/50 mmHg (12/03 0434) SpO2:  [90 %-98 %] 98 % (12/03 0434) Weight:  [69.763 kg (153 lb 12.8 oz)] 153 lb 12.8 oz (69.763 kg) (12/03 0434)  Physical Exam:  Rhythm:   Sinus/AAI paced  Breath sounds: Fairly clear  Heart sounds:  RRR no murmur  Incisions:  Clean and dry  Abdomen:  Soft non-tender  Extremities:  warm   Intake/Output from previous day: 12/02 0701 - 12/03 0700 In: 250 [P.O.:240; I.V.:10] Out: 251 [Urine:250; Stool:1] Intake/Output this shift:    Lab Results:  [facility] 01/24/11 0625  WBC 10.5  HGB 10.4*  HCT 31.8*  PLT 125*   BMET:  Basename 01/24/11 1000 01/24/11 0625  NA 133* 133*  K 4.9 5.0  CL 101 101  CO2 22 23  GLUCOSE 136* 135*  BUN 30* 30*  CREATININE 1.50* 1.66*  CALCIUM 8.7 8.6    CBG (last 3)   Basename 01/25/11 1131 01/23/11 1154  GLUCAP 137* 135*   PT/INR:   Basename 01/26/11 0500  LABPROT 18.8*  INR 1.54*    CXR:  N/A  Assessment/Plan: S/P Procedure(s) (LRB): MITRAL VALVE REPAIR (MVR) (N/A)  Acute onset hypoxemia this am.   Postope AFib/Aflutter, now sinus rhythm on amiodarone Still not quite therapeutic on coumadin. Severely weak and debilitated.  Will start IV heparin empirically and get CTA chest to r/o PE  Desiree [name] H 01/26/2011

## 2011-01-26 NOTE — Progress Notes (Signed)
Physical Therapy and Occupational Therapy Patient Details Name: ALGIE CALES MRN: [id number] DOB: [birth year] Today's Date: 01/26/2011  Noted Cardiac Rehab's note earlier this am about decreased sats, nausea, and CT ANgio to R/O PE.  Will hold PT/OT at this time and check another time.    Sunny Schlein, [place] [phone] 01/26/2011, 8:26 AM

## 2011-01-26 NOTE — Progress Notes (Signed)
UR Completed.  Rubin Dais Jane [phone] 01/26/2011  

## 2011-01-26 NOTE — Progress Notes (Signed)
Patient ID: Desiree [name], female   DOB: 01/24/1925, 77 y.o.   MRN: [id number]   Transferred back to SICU today for pulmonary embolism with hypoxemia  Filed Vitals:   01/26/11 1600 01/26/11 1602 01/26/11 1700 01/26/11 1800  BP: 127/57  141/58 136/53  Pulse: 69  70 72  Temp:  98.2 F (36.8 C)    TempSrc:  Axillary    Resp: 19  19 21   Height:      Weight:      SpO2: 99%  100% 92%    On 100% facemask O2  On heparin drip.  BMET    Component Value Date/Time   NA 133* 01/24/2011 1000   K 4.9 01/24/2011 1000   CL 101 01/24/2011 1000   CO2 22 01/24/2011 1000   GLUCOSE 136* 01/24/2011 1000   BUN 30* 01/24/2011 1000   CREATININE 1.50* 01/24/2011 1000   CALCIUM 8.7 01/24/2011 1000   GFRNONAA 31* 01/24/2011 1000   GFRAA 36* 01/24/2011 1000   Awake and alert, comfortable.  A/P:  Postop PE.  Continue heparin/coumadin

## 2011-01-26 NOTE — Progress Notes (Signed)
ANTICOAGULATION CONSULT NOTE - Initial Consult  Pharmacy Consult for Heparin Indication: rule out pulmonary embolus  No Known Allergies  Patient Measurements: Height: 4\' 11"  (149.9 cm) Weight: 153 lb 12.8 oz (69.763 kg) (Bed Scale) IBW/kg (Calculated) : 43.2  Heparin weight: 60.7 kg  Vital Signs: Temp: 97.9 F (36.6 C) (12/03 0434) Temp src: Oral (12/03 0434) BP: 108/50 mmHg (12/03 0434) Pulse Rate: 70  (12/03 0434)  Labs:  Basename 01/26/11 0500 01/25/11 0615 01/24/11 1000 01/24/11 0625  HGB -- -- -- 10.4*  HCT -- -- -- 31.8*  PLT -- -- -- 125*  APTT -- -- -- --  LABPROT 18.8* 16.5* -- 17.7*  INR 1.54* 1.31 -- 1.43  HEPARINUNFRC -- -- -- --  CREATININE -- -- 1.50* 1.66*  CKTOTAL -- -- -- --  CKMB -- -- -- --  TROPONINI -- -- -- --   Estimated Creatinine Clearance: 23.7 ml/min (by C-G formula based on Cr of 1.5).  Medical History: Past Medical History  Diagnosis Date  . Diverticulosis     chronic issues with consitpation and diarrhea  . Carotid artery disease   . Hypercholesterolemia   . Osteoarthritis     chronic-on multiple pain meds.  . Paroxysmal atrial fibrillation     not on coumadin-Cards=Tilley-saw him ?1 yr ago  . CHF (congestive heart failure), NYHA class II     has sudden onset decom CHF? 2/2 to PNA  . Lumbar disc disease   . Hypertensive heart disease without CHF   . Lumbar disc disease   . Heart murmur   . Pneumonia     few times  . Hypothyroidism   . Anemia   . Hypertension   . Mitral regurgitation   . Urinary tract infection     hx of UTI    Medications:  Scheduled:    . amiodarone (CORDARONE) IV bolus only 150 mg/100 mL  150 mg Intravenous Once  . aspirin  81 mg Oral Daily  . atorvastatin  80 mg Oral q1800  . docusate sodium  200 mg Oral Daily  . feeding supplement  237 mL Oral TID WC  . furosemide  40 mg Oral Daily  . levothyroxine  75 mcg Oral Daily  . metoprolol tartrate  25 mg Oral BID  . mupirocin ointment   Nasal BID  .  pantoprazole  40 mg Oral QAC breakfast  . povidone-iodine  1 application Topical BID  . sodium chloride  10 mL Intracatheter Q12H  . sodium chloride  3 mL Intravenous Q12H  . warfarin  2 mg Oral q1800  . warfarin   Does not apply Once  . DISCONTD: amiodarone (CORDARONE) IV bolus only 150 mg/100 mL  150 mg Intravenous Once  . DISCONTD: metoprolol tartrate  25 mg Oral BID    Assessment: 77 yo F to begin heparin to rule out PE s/p MV repair. Patient has already been on Coumadin which is being dosed by TCTS.   Goal of Therapy:  Heparin level 0.3-0.7 units/ml   Plan:  1) Heparin 3000 unit IV bolus then 850 units/hr (8.5 ml/hr) 2) Heparin level 8 hrs after started 3) Daily heparin level and CBC 4) F/U results of CT angio chest  Loura Back Danielle 01/26/2011,8:41 AM

## 2011-01-26 NOTE — Progress Notes (Signed)
ANTICOAGULATION CONSULT NOTE - follow up  Pharmacy Consult for Heparin Indication:  pulmonary embolus - confirmed 01/26/11  No Known Allergies  Patient Measurements: Height: 4\' 11"  (149.9 cm) Weight: 153 lb 12.8 oz (69.763 kg) (Bed Scale) IBW/kg (Calculated) : 43.2  Heparin weight: 60.7 kg  Labs:  Basename 01/26/11 1737 01/26/11 0500 01/25/11 0615 01/24/11 1000 01/24/11 0625  HGB -- -- -- -- 10.4*  HCT -- -- -- -- 31.8*  PLT -- -- -- -- 125*  APTT -- -- -- -- --  LABPROT -- 18.8* 16.5* -- 17.7*  INR -- 1.54* 1.31 -- 1.43  HEPARINUNFRC 0.51 -- -- -- --  CREATININE -- -- -- 1.50* 1.66*  CKTOTAL -- -- -- -- --  CKMB -- -- -- -- --  TROPONINI -- -- -- -- --   Estimated Creatinine Clearance: 23.7 ml/min (by C-G formula based on Cr of 1.5).  Assessment: Heparin level = 0.51 after 3000 units bolus and 850 units/hr for + PE s/p MV repair. Level therapeutic. No bleeding reported.  Patient has already been on Coumadin which is being dosed by TCTS.   Goal of Therapy:  Heparin level 0.3-0.7 units/ml   Plan:  1)  Continue heparin drip at 850 units/hr (8.5 ml/hr) 2)  Daily heparin level and CBC  Day Greb T 01/26/2011,6:55 PM

## 2011-01-26 NOTE — Progress Notes (Signed)
Report called to RN on 2300 to transfer pt back to ICU. Pt resting in bed on 100% nonrebreather, sat 94% upon transfer. Family aware of transfer.  Desiree [name]

## 2011-01-27 ENCOUNTER — Inpatient Hospital Stay (HOSPITAL_COMMUNITY): Payer: 59

## 2011-01-27 ENCOUNTER — Encounter: Payer: Self-pay | Admitting: Thoracic Surgery (Cardiothoracic Vascular Surgery)

## 2011-01-27 DIAGNOSIS — J90 Pleural effusion, not elsewhere classified: Secondary | ICD-10-CM

## 2011-01-27 LAB — BASIC METABOLIC PANEL
BUN: 31 mg/dL — ABNORMAL HIGH (ref 6–23)
CO2: 26 mEq/L (ref 19–32)
Calcium: 8.8 mg/dL (ref 8.4–10.5)
Chloride: 96 mEq/L (ref 96–112)
Creatinine, Ser: 1.1 mg/dL (ref 0.50–1.10)
GFR calc Af Amer: 52 mL/min — ABNORMAL LOW (ref >90)

## 2011-01-27 LAB — CBC
HCT: 32.1 % — ABNORMAL LOW (ref 36.0–46.0)
MCH: 30.4 pg (ref 26.0–34.0)
MCV: 93 fL (ref 78.0–100.0)
Platelets: 199 10*3/uL (ref 150–400)
RDW: 14.3 % (ref 11.5–15.5)

## 2011-01-27 LAB — HEPARIN LEVEL (UNFRACTIONATED): Heparin Unfractionated: 0.47 IU/mL (ref 0.30–0.70)

## 2011-01-27 NOTE — Progress Notes (Signed)
   CARDIOTHORACIC SURGERY PROGRESS NOTE   R6 Days Post-Op Procedure(s) (LRB): MITRAL VALVE REPAIR (MVR) (N/A)  Subjective: Looks and feels better.  Breathing comfortably on 50% face mask.  No further Afib/Aflutter.  Reports continued weakness and nausea.  Poor appetite.  Objective: Vital signs: BP Readings from Last 1 Encounters:  01/27/11 159/52   Pulse Readings from Last 1 Encounters:  01/27/11 74   Resp Readings from Last 1 Encounters:  01/27/11 16   Temp Readings from Last 1 Encounters:  01/27/11 97.6 F (36.4 C) Axillary    Hemodynamics:    Physical Exam:  Rhythm:   sinus  Breath sounds: Clear anterior, diminished at bases  Heart sounds:  RRR no murmur  Incisions:  Clean and dry  Abdomen:  Soft, non tender + BM yesterday  Extremities:  warm   Intake/Output from previous day: 12/03 0701 - 12/04 0700 In: 1157 [P.O.:330; I.V.:827] Out: 900 [Urine:900] Intake/Output this shift:    Lab Results:  Basename 01/27/11 0417  WBC 10.2  HGB 10.5*  HCT 32.1*  PLT 199   BMET:  Basename 01/27/11 0417 01/24/11 1000  NA 130* 133*  K 4.4 4.9  CL 96 101  CO2 26 22  GLUCOSE 123* 136*  BUN 31* 30*  CREATININE 1.10 1.50*  CALCIUM 8.8 8.7    CBG (last 3)   Basename 01/25/11 1131  GLUCAP 137*   ABG    Component Value Date/Time   PHART 7.424* 01/26/2011 0925   HCO3 23.2 01/26/2011 0925   TCO2 24.3 01/26/2011 0925   ACIDBASEDEF 0.6 01/26/2011 0925   O2SAT 93.1 01/26/2011 0925   CXR: Bilateral pleural effusions, R > L  PT/INR:  20.6/1.73  Assessment/Plan: S/P Procedure(s) (LRB): MITRAL VALVE REPAIR (MVR) (N/A)   Acute PE on heparin, coumadin  Post op AF, maintaining NSR on amiodarone  Bilateral pleural effusions, R > L  Volume excess, weight still >> baseline  Generalized weakness, debilitated state  Poor po intake, protein depleted malnutrition  Continue heparin, coumadin Continue amiodarone for now, may need to stop if po intake doesn't  improve Consider PleurX catheter(s) if effusions worsen Mobilize  Diuresis  Laporcha Marchesi H 01/27/2011 7:50 AM

## 2011-01-27 NOTE — Progress Notes (Addendum)
Occupational Therapy Evaluation Patient Details Name: Desiree [name] MRN: [id number] DOB: February 26, 1924 Today's Date: 01/27/2011  Problem List:  Patient Active Problem List  Diagnoses  . Hypertensive heart disease without CHF  . Diverticulosis  . Anemia, mild  . Lumbar disc disease  . Carotid artery disease  . Hypothyroidism  . Hyperlipidemia  . S/P mitral valve repair  . Pulmonary embolus    Past Medical History:  Past Medical History  Diagnosis Date  . Diverticulosis     chronic issues with consitpation and diarrhea  . Carotid artery disease   . Hypercholesterolemia   . Osteoarthritis     chronic-on multiple pain meds.  . Paroxysmal atrial fibrillation     not on coumadin-Cards=Tilley-saw him ?1 yr ago  . CHF (congestive heart failure), NYHA class II     has sudden onset decom CHF? 2/2 to PNA  . Lumbar disc disease   . Hypertensive heart disease without CHF   . Lumbar disc disease   . Heart murmur   . Pneumonia     few times  . Hypothyroidism   . Anemia   . Hypertension   . Mitral regurgitation   . Urinary tract infection     hx of UTI   Past Surgical History:  Past Surgical History  Procedure Date  . Carpal tunnel release     rt  . Rotator cuff repair     2 on right, 1 on left  . Abdominal hysterectomy   . Cataract extraction     bil  . Knee surgery     bilateral arthroscopy  . Tee without cardioversion 01/13/2011    Procedure: TRANSESOPHAGEAL ECHOCARDIOGRAM (TEE);  Surgeon: Wendall Stade, MD;  Location: [facility] ENDOSCOPY;  Service: Cardiovascular;  Laterality: Left;  . Cardiac catheterization 01/14/11  . Carotid endarterectomy     rt  . Mitral valve repair 01/21/2011    Procedure: MITRAL VALVE REPAIR (MVR);  Surgeon: Purcell Nails, MD;  Location: [facility] OR;  Service: Open Heart Surgery;  Laterality: N/A;    OT Assessment/Plan/Recommendation OT Assessment Clinical Impression Statement: Pt will benefit from skilled OT to reach min assist and supervision level  in acute care setting OT Recommendation/Assessment: Patient will need skilled OT in the acute care venue OT Problem List: Decreased strength;Decreased activity tolerance;Impaired balance (sitting and/or standing);Decreased knowledge of use of DME or AE;Cardiopulmonary status limiting activity;Impaired UE functional use Barriers to Discharge: Decreased caregiver support OT Therapy Diagnosis : Generalized weakness OT Plan OT Frequency: Min 1X/week OT Treatment/Interventions: Self-care/ADL training;Energy conservation;DME and/or AE instruction;Therapeutic activities;Patient/family education;Balance training OT Recommendation Follow Up Recommendations: Skilled nursing facility Equipment Recommended: Defer to next venue Individuals Consulted Consulted and Agree with Results and Recommendations: Patient OT Goals Acute Rehab OT Goals OT Goal Formulation: With patient Time For Goal Achievement: 2 weeks ADL Goals Pt Will Perform Grooming: with supervision;Standing at sink ADL Goal: Grooming - Progress: Progressing toward goals Pt Will Perform Lower Body Bathing: with min assist;Sit to stand from chair ADL Goal: Lower Body Bathing - Progress: Progressing toward goals Pt Will Perform Lower Body Dressing: Sit to stand from bed;with min assist ADL Goal: Lower Body Dressing - Progress: Progressing toward goals Pt Will Transfer to Toilet: with supervision;Ambulation;Comfort height toilet ADL Goal: Toilet Transfer - Progress: Progressing toward goals Pt Will Perform Toileting - Clothing Manipulation: with supervision;Standing ADL Goal: Toileting - Clothing Manipulation - Progress: Progressing toward goals Pt Will Perform Toileting - Hygiene: with supervision;Sit to stand from 3-in-1/toilet ADL Goal: Toileting -  Hygiene - Progress: Progressing toward goals Pt Will Perform Tub/Shower Transfer: Shower transfer;with supervision;Ambulation;Shower seat with back ADL Goal: Web designer - Progress:  Discontinued (comment) Miscellaneous OT Goals Miscellaneous OT Goal #1: pt will state 2 energy conservation techniques OT Goal: Miscellaneous Goal #1 - Progress: Progressing toward goals  OT Evaluation Precautions/Restrictions  Precautions Precautions: Sternal;Fall Restrictions Weight Bearing Restrictions: No Prior Functioning Home Living Lives With: Alone Type of Home: House Home Layout: One level Home Access: Stairs to enter Entrance Stairs-Rails: Can reach both;Right Entrance Stairs-Number of Steps: 2 at back and 5 at front entrance [employer] Shower/Tub: Health visitor: Standard Home Adaptive Equipment: Raised toilet seat with rails;Shower chair without back Additional Comments: has elevated toilet seat; has shower seat but doesn't use; brother just died and she can use his walker (if it goes low enough) Prior Function Level of Independence: Independent with basic ADLs;Independent with gait;Independent with transfers;Independent with homemaking with ambulation Driving: Yes Vocation: Other (comment) ADL ADL Eating/Feeding: Simulated;Supervision/safety Where Assessed - Eating/Feeding: Chair Grooming: Simulated;Minimal assistance Grooming Details (indicate cue type and reason): Pt. with increased fatigue Where Assessed - Grooming: Sitting, chair;Supported Upper Body Bathing: Simulated;Chest;Right arm;Left arm;Abdomen;Minimal assistance Where Assessed - Upper Body Bathing: Sitting, chair;Supported Lower Body Bathing: Simulated;Maximal assistance Where Assessed - Lower Body Bathing: Sit to stand from bed Upper Body Dressing: Performed;Minimal assistance Upper Body Dressing Details (indicate cue type and reason): With donning gown on backside Where Assessed - Upper Body Dressing: Sitting, chair;Supported Lower Body Dressing: Simulated;Maximal assistance Lower Body Dressing Details (indicate cue type and reason): Pt. unable to reach feet to complete Where  Assessed - Lower Body Dressing: Sit to stand from bed Toilet Transfer: Simulated;+2 Total assistance;Comment for patient % (pt=40%) Toilet Transfer Method: Stand pivot Toilet Transfer Equipment: Other (comment) (recliner) Toileting - Clothing Manipulation: Simulated;Maximal assistance Toileting - Clothing Manipulation Details (indicate cue type and reason): With moving gown Where Assessed - Toileting Clothing Manipulation: Sit to stand from 3-in-1 or toilet Toileting - Hygiene: Simulated;Maximal assistance Where Assessed - Toileting Hygiene: Standing Tub/Shower Transfer: Not assessed Tub/Shower Transfer Method: Not assessed ADL Comments: Pt. has bil rotator cuff problems; has difficulty sustaining arms to the top of her head however can reach 90 degrees. Pt. limited today due to fatigue from pain medications Vision/Perception  Vision - History Baseline Vision: No visual deficits Patient Visual Report: No change from baseline Vision - Assessment Eye Alignment: Within Functional Limits Vision Assessment: Vision not tested Perception Perception: Within Functional Limits Cognition Cognition Arousal/Alertness: Awake/alert Overall Cognitive Status: Appears within functional limits for tasks assessed Orientation Level: Oriented X4    Extremity Assessment RUE Assessment RUE Assessment: Exceptions to [facility] (~90degrees due to rotator cuff problems) LUE Assessment LUE Assessment: Exceptions to WFL (~90 degrees due to rotator cuff problems) Mobility  Bed Mobility Bed Mobility: Yes Rolling Right: 4: Min assist Rolling Right Details (indicate cue type and reason): Min verbal cues for sequencing and initiation Right Sidelying to Sit: 3: Mod assist;HOB flat Right Sidelying to Sit Details (indicate cue type and reason): Assist for bilateral legs off EOB Sitting - Scoot to Edge of Bed: 2: Max assist Transfers Transfers: Yes Sit to Stand: 1: +2 Total assist;Patient percentage (comment);From  bed;With upper extremity assist (pt. =50%) Sit to Stand Details (indicate cue type and reason): Max verbal cues for hand placement around heart pillow due to sternal precautions  Orthostatic BPs  Supine 157/53  Sitting 129/58  Standing 91/62  Sitting 137/58       End of  Session OT - End of Session Equipment Utilized During Treatment: Gait belt Activity Tolerance: Patient limited by pain;Patient limited by fatigue Patient left: in chair;with call bell in reach;with family/visitor present Nurse Communication: Mobility status for transfers General Behavior During Session: Lethargic Cognition: [facility] for tasks performed   Macdonald Rigor,OTR/L Pager [phone] 01/27/2011, 1:48 PM

## 2011-01-27 NOTE — Progress Notes (Signed)
Patient ID: Desiree [name], female   DOB: 1924/07/18, 77 y.o.   MRN: [id number] BP 129/53  Pulse 74  Temp(Src) 97.5 F (36.4 C) (Oral)  Resp 18  Ht 4\' 11"  (1.499 m)  Wt 65.5 kg (144 lb 6.4 oz)  BMI 29.17 kg/m2  SpO2 100% PE on heparin Thoracentesis this afternoon ~1 liter drained Continue current care

## 2011-01-27 NOTE — Plan of Care (Signed)
Problem: Discharge Progression Outcomes Goal: Activity appropriate for discharge plan Outcome: Progressing OT recommending ST-SNF at D/C and will follow acutely. Thanks!  Cassandria Anger, OTR/L Pager: [phone] 01/27/2011 .

## 2011-01-27 NOTE — Op Note (Signed)
CARDIOTHORACIC SURGERY OPERATIVE NOTE   PRE-OPERATIVE DIAGNOSIS:  right pleural effusion  POST-OPERATIVE DIAGNOSIS:  same  PROCEDURE:  right needle thoracentesis  SURGEON:  Purcell Nails, MD  ANESTHESIA:  1% lidocaine local  PROCEDURE NOTE:  Following full informed consent the patient was placed in the upright sitting position and continuously monitored with telemetry, pulse oximetry, and intermittent blood pressure recordings.  The patient's right back was prepared and draped in a sterile manner.  1% lidocaine was utilized to anesthetize the skin, subcutaneous tissues and intercostal space in the mid scapular line overlying the 9th rib.  Needle thoracentesis was performed through a small stab incision using a thoracentesis kit.  A total of 850 mL of thin serosanguinous fluid  was evacuated using a vacuum bottle trap.  A portion of the fluid was trapped separately and sent for routine culture.  The patient tolerated the procedure well and there were no complications.  A follow-up CXR was ordered.  OWEN,CLARENCE H 01/27/2011 4:27 PM

## 2011-01-27 NOTE — Progress Notes (Signed)
TCTS BRIEF PROGRESS NOTE   I updated Mrs Dauenhauer's daughter over the telephone regarding her condition.  All questions answered.  Melani Brisbane H 01/27/2011 4:18 PM

## 2011-01-27 NOTE — Progress Notes (Addendum)
INITIAL ADULT NUTRITION ASSESSMENT Date: 01/27/2011   Time: 11:43 AM  Reason for Assessment: Nutrition Risk Report  ASSESSMENT: Female 77 y.o.  Dx: S/P mitral valve repair  Hx:  Past Medical History  Diagnosis Date  . Diverticulosis     chronic issues with consitpation and diarrhea  . Carotid artery disease   . Hypercholesterolemia   . Osteoarthritis     chronic-on multiple pain meds.  . Paroxysmal atrial fibrillation     not on coumadin-Cards=Tilley-saw him ?1 yr ago  . CHF (congestive heart failure), NYHA class II     has sudden onset decom CHF? 2/2 to PNA  . Lumbar disc disease   . Hypertensive heart disease without CHF   . Lumbar disc disease   . Heart murmur   . Pneumonia     few times  . Hypothyroidism   . Anemia   . Hypertension   . Mitral regurgitation   . Urinary tract infection     hx of UTI    Related Meds:     . amiodarone (CORDARONE) IV bolus only 150 mg/100 mL  150 mg Intravenous Once  . atorvastatin  80 mg Oral q1800  . docusate sodium  200 mg Oral Daily  . feeding supplement  237 mL Oral TID WC  . furosemide  40 mg Oral Daily  . levothyroxine  75 mcg Oral Daily  . metoprolol tartrate  25 mg Oral BID  . mupirocin ointment   Nasal BID  . pantoprazole  40 mg Oral QAC breakfast  . povidone-iodine  1 application Topical BID  . sodium chloride  10 mL Intracatheter Q12H  . sodium chloride  3 mL Intravenous Q12H  . warfarin  2 mg Oral q1800  . warfarin   Does not apply Once  . DISCONTD: aspirin  81 mg Oral Daily     Ht: 4\' 11"  (149.9 cm)  Wt: 144 lb 6.4 oz (65.5 kg) -- likely higher due to volume excess  Ideal Wt: 44.6 kg % Ideal Wt: 145%   Usual Wt: --- % Usual Wt: ---  Body mass index is 29.17 kg/(m^2).  Food/Nutrition Related Hx: unintentional weight loss > 10 lbs within the last month per nutrition screen  Labs:  CMP     Component Value Date/Time   NA 130* 01/27/2011 0417   K 4.4 01/27/2011 0417   CL 96 01/27/2011 0417   CO2 26  01/27/2011 0417   GLUCOSE 123* 01/27/2011 0417   BUN 31* 01/27/2011 0417   CREATININE 1.10 01/27/2011 0417   CALCIUM 8.8 01/27/2011 0417   PROT 6.9 01/19/2011 1241   ALBUMIN 3.5 01/19/2011 1241   AST 22 01/19/2011 1241   ALT 22 01/19/2011 1241   ALKPHOS 97 01/19/2011 1241   BILITOT 0.4 01/19/2011 1241   GFRNONAA 45* 01/27/2011 0417   GFRAA 52* 01/27/2011 0417     I/O last 3 completed shifts: In: 1167 [P.O.:330; I.V.:837] Out: 901 [Urine:900; Stool:1] Total I/O In: 256.6 [P.O.:130; I.V.:126.6] Out: -     Diet Order: Full Liquid  Supplements/Tube Feeding: Ensure Clinical Strength TID  IVF:    amiodarone (CORDARONE) infusion Last Rate: 30 mg/hr (01/27/11 1000)  heparin Last Rate: 8.5 mL/hr (01/27/11 1000)    Estimated Nutritional Needs:   Kcal:1,600-1,800 Protein: 75-85 gm Fluid: 1.6-1.8 L  RD spoke briefly with pt's niece -- reports pt's appetite is rather poor; unsure re: quantity of pt's weight loss; + nausea; PO intake at 30% per flowsheet records; noted pt with  Stage 1 pressure ulcer to buttocks; suspect some level of malnutrition given weight loss hx, likely suboptimal energy intake PTA  NUTRITION DIAGNOSIS: -Inadequate oral intake (NI-2.1).  Status: Ongoing  RELATED TO: poor appetite, nausea   AS EVIDENCE BY: PO intake records, family report  MONITORING/EVALUATION(Goals): Goal: meet >90% of estimated nutrition needs to promote post-op healing & recovery Monitor: PO intake, weight, labs, wound care  EDUCATION NEEDS: -Education not appropriate at this time  INTERVENTION:  Continue Ensure Clinical Stength PO TID  If PO intake does not improve, consider initiation of nutrition support  RD to follow for nutrition care plan  Dietitian #: [phone]  DOCUMENTATION CODES Per approved criteria  -Not Applicable    Alger Memos 01/27/2011, 11:43 AM

## 2011-01-27 NOTE — Progress Notes (Signed)
ANTICOAGULATION CONSULT NOTE - Follow Up Consult  Pharmacy Consult for heparin Indication: pulmonary embolus  No Known Allergies  Patient Measurements: Height: 4\' 11"  (149.9 cm) Weight: 144 lb 6.4 oz (65.5 kg) IBW/kg (Calculated) : 43.2   Vital Signs: Temp: 97.6 F (36.4 C) (12/04 0717) Temp src: Axillary (12/04 0717) BP: 158/67 mmHg (12/04 0900) Pulse Rate: 78  (12/04 0900)  Labs:  Basename 01/27/11 0417 01/26/11 1737 01/26/11 0500 01/25/11 0615  HGB 10.5* -- -- --  HCT 32.1* -- -- --  PLT 199 -- -- --  APTT -- -- -- --  LABPROT 20.6* -- 18.8* 16.5*  INR 1.73* -- 1.54* 1.31  HEPARINUNFRC 0.47 0.51 -- --  CREATININE 1.10 -- -- --  CKTOTAL -- -- -- --  CKMB -- -- -- --  TROPONINI -- -- -- --   Estimated Creatinine Clearance: 31.3 ml/min (by C-G formula based on Cr of 1.1).   Medications:  Scheduled:    . amiodarone (CORDARONE) IV bolus only 150 mg/100 mL  150 mg Intravenous Once  . atorvastatin  80 mg Oral q1800  . docusate sodium  200 mg Oral Daily  . feeding supplement  237 mL Oral TID WC  . furosemide  40 mg Oral Daily  . levothyroxine  75 mcg Oral Daily  . metoprolol tartrate  25 mg Oral BID  . mupirocin ointment   Nasal BID  . pantoprazole  40 mg Oral QAC breakfast  . povidone-iodine  1 application Topical BID  . sodium chloride  10 mL Intracatheter Q12H  . sodium chloride  3 mL Intravenous Q12H  . warfarin  2 mg Oral q1800  . warfarin   Does not apply Once  . DISCONTD: aspirin  81 mg Oral Daily   Infusions:    . amiodarone (CORDARONE) infusion 30 mg/hr (01/27/11 0800)  . heparin 8.5 mL/hr (01/27/11 0800)    Assessment: 33 yof s/p MV repair with new acute PE confirmed by CT. Heparin level is therapeutic at 0.47. MD is dosing coumadin. No bleeding noted. H&H is stable and platelets are improving.   Goal of Therapy:  Heparin level 0.3-0.7 units/ml   Plan:  Continue heparin gtt at 850units/hr F/u AM heparin level  Raya Mckinstry, Drake Leach 01/27/2011,10:33 AM

## 2011-01-28 ENCOUNTER — Inpatient Hospital Stay (HOSPITAL_COMMUNITY): Payer: 59

## 2011-01-28 LAB — BASIC METABOLIC PANEL
BUN: 23 mg/dL (ref 6–23)
CO2: 28 mEq/L (ref 19–32)
Calcium: 8.6 mg/dL (ref 8.4–10.5)
Chloride: 95 mEq/L — ABNORMAL LOW (ref 96–112)
Creatinine, Ser: 0.92 mg/dL (ref 0.50–1.10)
Glucose, Bld: 115 mg/dL — ABNORMAL HIGH (ref 70–99)

## 2011-01-28 LAB — CBC
HCT: 31.2 % — ABNORMAL LOW (ref 36.0–46.0)
MCH: 30.7 pg (ref 26.0–34.0)
MCV: 92.9 fL (ref 78.0–100.0)
RBC: 3.36 MIL/uL — ABNORMAL LOW (ref 3.87–5.11)
WBC: 8.7 10*3/uL (ref 4.0–10.5)

## 2011-01-28 LAB — PROTIME-INR: Prothrombin Time: 24.8 seconds — ABNORMAL HIGH (ref 11.6–15.2)

## 2011-01-28 MED FILL — Heparin Sodium (Porcine) Inj 1000 Unit/ML: INTRAMUSCULAR | Qty: 60 | Status: AC

## 2011-01-28 MED FILL — Sodium Chloride Irrigation Soln 0.9%: Qty: 3000 | Status: AC

## 2011-01-28 MED FILL — Electrolyte-R (PH 7.4) Solution: INTRAVENOUS | Qty: 3000 | Status: AC

## 2011-01-28 MED FILL — Sodium Chloride IV Soln 0.9%: INTRAVENOUS | Qty: 1000 | Status: AC

## 2011-01-28 NOTE — Progress Notes (Signed)
7 Days Post-Op Procedure(s) (LRB): MITRAL VALVE REPAIR (MVR) (N/A) Subjective:  Doesn't feel well.  Hurts all over, persistent nausea and no appetite.  Objective: Vital signs in last 24 hours: Temp:  [97.4 F (36.3 C)-98.9 F (37.2 C)] 98 F (36.7 C) (12/05 0736) Pulse Rate:  [67-82] 81  (12/05 0800) Cardiac Rhythm:  [-] Heart block (12/05 0800) Resp:  [14-22] 20  (12/05 0800) BP: (93-170)/(34-70) 113/37 mmHg (12/05 0800) SpO2:  [90 %-100 %] 100 % (12/05 0800) FiO2 (%):  [40 %] 40 % (12/05 0600) Weight:  [64.4 kg (141 lb 15.6 oz)] 141 lb 15.6 oz (64.4 kg) (12/05 0600)  Hemodynamic parameters for last 24 hours:    Intake/Output from previous day: 12/04 0701 - 12/05 0700 In: 1442.8 [P.O.:430; I.V.:1012.8] Out: 130 [Urine:130] Intake/Output this shift: Total I/O In: 142.2 [P.O.:100; I.V.:42.2] Out: -   General appearance: looks weak Neurologic: intact Heart: regular rate and rhythm, S1, S2 normal, no murmur, click, rub or gallop Lungs: clear to auscultation bilaterally Extremities: edema mild in legs Wound: incision healing well  Lab Results:  Basename 01/28/11 0318 01/27/11 0417  WBC 8.7 10.2  HGB 10.3* 10.5*  HCT 31.2* 32.1*  PLT 214 199   BMET:  Basename 01/28/11 0318 01/27/11 0417  NA 131* 130*  K 4.1 4.4  CL 95* 96  CO2 28 26  GLUCOSE 115* 123*  BUN 23 31*  CREATININE 0.92 1.10  CALCIUM 8.6 8.8    PT/INR:  Basename 01/28/11 0318  LABPROT 24.8*  INR 2.20*   ABG    Component Value Date/Time   PHART 7.424* 01/26/2011 0925   HCO3 23.2 01/26/2011 0925   TCO2 24.3 01/26/2011 0925   ACIDBASEDEF 0.6 01/26/2011 0925   O2SAT 93.1 01/26/2011 0925   CBG (last 3)   Basename 01/25/11 1131  GLUCAP 137*    Assessment/Plan: S/P Procedure(s) (LRB): MITRAL VALVE REPAIR (MVR) (N/A) Mobilize Diuresis Therapeutic on coumadin.  Will keep heparin going for another day for crossover. Will dc amio since this may be causing nausea.    LOS: 7 days     Toma Arts K 01/28/2011

## 2011-01-28 NOTE — Progress Notes (Signed)
Patient ID: Desiree [name], female   DOB: November 22, 1924, 77 y.o.   MRN: [id number]  Filed Vitals:   01/28/11 1700 01/28/11 1800 01/28/11 1900 01/28/11 1948  BP: 124/53 125/77 121/50   Pulse: 79 80 82   Temp:    97.9 F (36.6 C)  TempSrc:    Oral  Resp: 20 22 23    Height:      Weight:      SpO2: 100% 100% 100%    Maintaining sinus rhythm today since amio stopped.  Urine output ok  BMET    Component Value Date/Time   NA 131* 01/28/2011 0318   K 4.1 01/28/2011 0318   CL 95* 01/28/2011 0318   CO2 28 01/28/2011 0318   GLUCOSE 115* 01/28/2011 0318   BUN 23 01/28/2011 0318   CREATININE 0.92 01/28/2011 0318   CALCIUM 8.6 01/28/2011 0318   GFRNONAA 56* 01/28/2011 0318   GFRAA 64* 01/28/2011 0318    CBC    Component Value Date/Time   WBC 8.7 01/28/2011 0318   WBC 3.9 08/17/2005 1136   RBC 3.36* 01/28/2011 0318   RBC 3.86 08/17/2005 1136   HGB 10.3* 01/28/2011 0318   HGB 12.2 08/17/2005 1136   HCT 31.2* 01/28/2011 0318   HCT 35.6 08/17/2005 1136   PLT 214 01/28/2011 0318   PLT 183 08/17/2005 1136   MCV 92.9 01/28/2011 0318   MCV 92.1 08/17/2005 1136   MCH 30.7 01/28/2011 0318   MCH 31.7 08/17/2005 1136   MCHC 33.0 01/28/2011 0318   MCHC 34.4 08/17/2005 1136   RDW 14.3 01/28/2011 0318   RDW 12.7 08/17/2005 1136   LYMPHSABS 0.7 01/11/2011 1010   LYMPHSABS 1.1 08/17/2005 1136   MONOABS 0.4 01/11/2011 1010   MONOABS 0.3 08/17/2005 1136   EOSABS 0.3 01/11/2011 1010   EOSABS 0.1 08/17/2005 1136   BASOSABS 0.0 01/11/2011 1010   BASOSABS 0.0 08/17/2005 1136    A/P:  Stable day.  Continue present course

## 2011-01-28 NOTE — Progress Notes (Signed)
ANTICOAGULATION CONSULT NOTE - Follow Up Consult  Pharmacy Consult for heparin Indication: pulmonary embolus  No Known Allergies  Patient Measurements: Height: 4\' 11"  (149.9 cm) Weight: 141 lb 15.6 oz (64.4 kg) IBW/kg (Calculated) : 43.2   Vital Signs: Temp: 98 F (36.7 C) (12/05 0736) Temp src: Oral (12/05 0736) BP: 113/37 mmHg (12/05 0800) Pulse Rate: 81  (12/05 0800)  Labs:  Basename 01/28/11 0318 01/27/11 0417 01/26/11 1737 01/26/11 0500  HGB 10.3* 10.5* -- --  HCT 31.2* 32.1* -- --  PLT 214 199 -- --  APTT -- -- -- --  LABPROT 24.8* 20.6* -- 18.8*  INR 2.20* 1.73* -- 1.54*  HEPARINUNFRC 0.47 0.47 0.51 --  CREATININE 0.92 1.10 -- --  CKTOTAL -- -- -- --  CKMB -- -- -- --  TROPONINI -- -- -- --   Estimated Creatinine Clearance: 37.2 ml/min (by C-G formula based on Cr of 0.92).   Medications:  Scheduled:    . atorvastatin  80 mg Oral q1800  . docusate sodium  200 mg Oral Daily  . feeding supplement  237 mL Oral TID WC  . furosemide  40 mg Oral Daily  . levothyroxine  75 mcg Oral Daily  . metoprolol tartrate  25 mg Oral BID  . pantoprazole  40 mg Oral QAC breakfast  . povidone-iodine  1 application Topical BID  . sodium chloride  10 mL Intracatheter Q12H  . warfarin  2 mg Oral q1800  . warfarin   Does not apply Once  . DISCONTD: amiodarone (CORDARONE) IV bolus only 150 mg/100 mL  150 mg Intravenous Once  . DISCONTD: sodium chloride  3 mL Intravenous Q12H    Assessment: 56 yof s/p MV repair with new acute PE confirmed by CT. Heparin level is therapeutic at 0.47. INR is also therapeutic at 2.2. MD wishes to continue heparin for another day for therapeutic overlap. No bleeding noted, CBC stable. Noted that amiodarone was dc'd so pt may require a higher coumadin dose.   Goal of Therapy:  Heparin level 0.3-0.7 units/ml   Plan:  Continue heparin gtt at 850units/hr F/u AM heparin level  Maylon Sailors, Drake Leach 01/28/2011,10:59 AM

## 2011-01-28 NOTE — Progress Notes (Signed)
Occupational Therapy Treatment Patient Details Name: Desiree [name] MRN: [id number] DOB: [birth year] Today's Date: 01/28/2011  OT Assessment/Plan OT Assessment/Plan Comments on Treatment Session: Pt. completed chair-bedside commode and with loose stool. Limited activity due to breakfast arriving and pt. requesting to eat. OT Plan: Discharge plan remains appropriate OT Frequency: Min 1X/week Follow Up Recommendations: Skilled nursing facility Equipment Recommended: Defer to next venue OT Goals Acute Rehab OT Goals OT Goal Formulation: With patient Time For Goal Achievement: 2 weeks ADL Goals Pt Will Perform Grooming: with supervision;Standing at sink ADL Goal: Grooming - Progress: Progressing toward goals Pt Will Perform Lower Body Bathing: with min assist;Sit to stand from chair ADL Goal: Lower Body Bathing - Progress: Not addressed Pt Will Perform Lower Body Dressing: Sit to stand from bed;with min assist ADL Goal: Lower Body Dressing - Progress: Not addressed Pt Will Transfer to Toilet: with supervision;Ambulation;Comfort height toilet ADL Goal: Toilet Transfer - Progress: Progressing toward goals Pt Will Perform Toileting - Clothing Manipulation: with supervision;Standing ADL Goal: Toileting - Clothing Manipulation - Progress: Progressing toward goals Pt Will Perform Toileting - Hygiene: with supervision;Sit to stand from 3-in-1/toilet ADL Goal: Toileting - Hygiene - Progress: Progressing toward goals Pt Will Perform Tub/Shower Transfer: Shower transfer;with supervision;Ambulation;Shower seat with back ADL Goal: Web designer - Progress: Discontinued (comment) Miscellaneous OT Goals Miscellaneous OT Goal #1: pt will state 2 energy conservation techniques OT Goal: Miscellaneous Goal #1 - Progress: Progressing toward goals  OT Treatment Precautions/Restrictions  Precautions Precautions: Sternal;Fall Restrictions Weight Bearing Restrictions: No   ADL ADL Toilet Transfer:  Performed;+2 Total assistance;Comment for patient % (pt=60%) Toilet Transfer Details (indicate cue type and reason): Min verbal cues for hand placement and transfer technique for following sternal precautions and for upright position Toilet Transfer Method: Stand pivot Toilet Transfer Equipment: Bedside commode Toileting - Clothing Manipulation: Performed;Minimal assistance Toileting - Clothing Manipulation Details (indicate cue type and reason): With moving gown Where Assessed - Toileting Clothing Manipulation: Sit to stand from 3-in-1 or toilet Toileting - Hygiene: Simulated;Maximal assistance Toileting - Hygiene Details (indicate cue type and reason): Pt. unable to reach backside to perform hygiene due to incision Where Assessed - Toileting Hygiene: Standing Mobility  Bed Mobility Bed Mobility: No Transfers Transfers: Yes Sit to Stand: 1: +2 Total assist;Patient percentage (comment);From chair/3-in-1 (pt=60% with hands on knees and rocking to complete) Sit to Stand Details (indicate cue type and reason): Max verbal cues for hands on knees Stand to Sit: 3: Mod assist;To chair/3-in-1     End of Session General Behavior During Session: [facility] for tasks performed Cognition: [facility] for tasks performed  Cassandria Anger, OTR/L Pager [phone]  01/28/2011, 9:05 AM

## 2011-01-28 NOTE — Progress Notes (Signed)
Physical Therapy Treatment Patient Details Name: Desiree [name] MRN: [id number] DOB: 13-Oct-1924 Today's Date: 01/28/2011  PT Assessment/Plan  PT - Assessment/Plan Comments on Treatment Session: pt moving slow and limited by pain and fatigue.   PT Plan: Discharge plan remains appropriate PT Frequency: Min 3X/week Follow Up Recommendations: Skilled nursing facility Equipment Recommended: Defer to next venue PT Goals  Acute Rehab PT Goals PT Goal: Rolling Supine to Right Side - Progress: Progressing toward goal PT Goal: Supine/Side to Sit - Progress: Progressing toward goal PT Goal: Sit to Supine/Side - Progress: Progressing toward goal PT Goal: Sit to Stand - Progress: Progressing toward goal PT Goal: Stand to Sit - Progress: Progressing toward goal PT Transfer Goal: Bed to Chair/Chair to Bed - Progress: Progressing toward goal PT Goal: Stand - Progress: Progressing toward goal PT Goal: Ambulate - Progress: Progressing toward goal  PT Treatment Precautions/Restrictions  Precautions Precautions: Sternal;Fall Restrictions Weight Bearing Restrictions: No Mobility (including Balance) Bed Mobility Bed Mobility: No Transfers Transfers: Yes Sit to Stand: 1: +2 Total assist;Patient percentage (comment);From chair/3-in-1 (pt 60%) Sit to Stand Details (indicate cue type and reason): Max cueing for sternal precautions and encouragement Stand to Sit: 3: Mod assist;To chair/3-in-1 Stand to Sit Details: Max cueing for sternal precautions and to control descent Ambulation/Gait Ambulation/Gait: No Wheelchair Mobility Wheelchair Mobility: No    Exercise    End of Session PT - End of Session Equipment Utilized During Treatment: Gait belt Activity Tolerance: Patient limited by fatigue;Patient limited by pain Patient left: in chair;with call bell in reach Nurse Communication: Mobility status for transfers General Behavior During Session: [facility] for tasks performed Cognition: [facility] for tasks  performed  Sunny Schlein, [place] [phone] 01/28/2011, 11:10 AM

## 2011-01-28 NOTE — Progress Notes (Signed)
UR Completed.  Jaedon Siler Jane [phone] 01/28/2011  

## 2011-01-29 LAB — PROTIME-INR: Prothrombin Time: 24.6 seconds — ABNORMAL HIGH (ref 11.6–15.2)

## 2011-01-29 LAB — GLUCOSE, CAPILLARY

## 2011-01-29 LAB — CBC
HCT: 28.3 % — ABNORMAL LOW (ref 36.0–46.0)
Hemoglobin: 9.5 g/dL — ABNORMAL LOW (ref 12.0–15.0)
MCH: 30.9 pg (ref 26.0–34.0)
MCV: 92.2 fL (ref 78.0–100.0)
Platelets: 189 10*3/uL (ref 150–400)
RBC: 3.07 MIL/uL — ABNORMAL LOW (ref 3.87–5.11)
WBC: 7.7 10*3/uL (ref 4.0–10.5)

## 2011-01-29 NOTE — Progress Notes (Signed)
ANTICOAGULATION CONSULT NOTE - Follow Up Consult  Pharmacy Consult for heparin Indication: pulmonary embolus  No Known Allergies  Patient Measurements: Height: 4\' 11"  (149.9 cm) Weight: 143 lb 8.3 oz (65.1 kg) IBW/kg (Calculated) : 43.2   Vital Signs: Temp: 98 F (36.7 C) (12/06 1150) Temp src: Oral (12/06 1150) BP: 116/45 mmHg (12/06 1400) Pulse Rate: 84  (12/06 1400)  Labs:  Basename 01/29/11 0317 01/28/11 0318 01/27/11 0417  HGB 9.5* 10.3* --  HCT 28.3* 31.2* 32.1*  PLT 189 214 199  APTT -- -- --  LABPROT 24.6* 24.8* 20.6*  INR 2.18* 2.20* 1.73*  HEPARINUNFRC 0.30 0.47 0.47  CREATININE -- 0.92 1.10  CKTOTAL -- -- --  CKMB -- -- --  TROPONINI -- -- --   Estimated Creatinine Clearance: 37.4 ml/min (by C-G formula based on Cr of 0.92).   Medications:  Infusions:    . heparin 8.5 mL/hr (01/29/11 1400)    Assessment: 8 yof s/p MV repair with acute PE confirmed by CT. Heparin level is therapeutic at 0.3. Anticipate heparin to be dc'd since today is the the 2nd day of overlap with therapeutic INR.   Goal of Therapy:  Heparin level 0.3-0.7 units/ml   Plan:  Continue heparin gtt at 850units/hr Consider DC heparin since today is the 2nd day of therapeutic overlap  Kayo Zion, Drake Leach 01/29/2011,3:15 PM

## 2011-01-29 NOTE — Progress Notes (Signed)
CSW in contact with pt daughter via email regarding pt disposition. Pt daughter has selected bed at [facility]. SNF states they will have bed available next week. CSW updating medical information with facility and CSW will continue to follow to facilitate pt transfer to SNF when pt medically ready. Baxter Flattery, MSW [phone]

## 2011-01-29 NOTE — Progress Notes (Signed)
Patient ID: Desiree [name], female   DOB: 1924-10-08, 77 y.o.   MRN: [id number] Stable day Walked 200 ft Poss to 2000 tomorrow Delight Ovens MD 01/29/2011 7:18 PM

## 2011-01-29 NOTE — Progress Notes (Signed)
8 Days Post-Op Procedure(s) (LRB): MITRAL VALVE REPAIR (MVR) (N/A) Subjective:  Nausea and appetite better Walked twice. Some dyspnea with walking.  Objective: Vital signs in last 24 hours: Temp:  [97.9 F (36.6 C)-98.2 F (36.8 C)] 98 F (36.7 C) (12/06 1500) Pulse Rate:  [78-96] 84  (12/06 1400) Cardiac Rhythm:  [-] Heart block (12/06 1500) Resp:  [14-26] 21  (12/06 1500) BP: (92-145)/(38-77) 106/41 mmHg (12/06 1500) SpO2:  [94 %-100 %] 97 % (12/06 1500) Weight:  [65.1 kg (143 lb 8.3 oz)] 143 lb 8.3 oz (65.1 kg) (12/06 0600)  Remains in sinus rhythm off amio since yesterday am    Intake/Output from previous day: 12/05 0701 - 12/06 0700 In: 1855.7 [P.O.:1337; I.V.:518.7] Out: 1702 [Urine:1700; Stool:2] Intake/Output this shift: Total I/O In: 343.5 [P.O.:200; I.V.:143.5] Out: 1100 [Urine:1100]  General appearance: alert and cooperative Heart: regular rate and rhythm Lungs: diminished breath sounds base - left Extremities: extremities normal, atraumatic, no cyanosis or edema Wound: incision healing well  Lab Results:  Basename 01/29/11 0317 01/28/11 0318  WBC 7.7 8.7  HGB 9.5* 10.3*  HCT 28.3* 31.2*  PLT 189 214   BMET:  Basename 01/28/11 0318 01/27/11 0417  NA 131* 130*  [name] 4.1 4.4  CL 95* 96  CO2 28 26  GLUCOSE 115* 123*  BUN 23 31*  CREATININE 0.92 1.10  CALCIUM 8.6 8.8    PT/INR:  Basename 01/29/11 0317  LABPROT 24.6*  INR 2.18*   ABG    Component Value Date/Time   PHART 7.424* 01/26/2011 0925   HCO3 23.2 01/26/2011 0925   TCO2 24.3 01/26/2011 0925   ACIDBASEDEF 0.6 01/26/2011 0925   O2SAT 93.1 01/26/2011 0925   CBG (last 3)  No results found for this basename: GLUCAP:3 in the last 72 hours  Assessment/Plan: S/P Procedure(s) (LRB): MITRAL VALVE REPAIR (MVR) (N/A) Mobilize Diuresis repeat cxr in am to reassess effusions Dc heparin, continue coumadin Possibly ready for transfer tomorrow    LOS: 8 days    Desiree [name] 01/29/2011

## 2011-01-30 ENCOUNTER — Inpatient Hospital Stay (HOSPITAL_COMMUNITY): Payer: 59

## 2011-01-30 LAB — CBC
HCT: 28.2 % — ABNORMAL LOW (ref 36.0–46.0)
Hemoglobin: 9.2 g/dL — ABNORMAL LOW (ref 12.0–15.0)
RBC: 3.02 MIL/uL — ABNORMAL LOW (ref 3.87–5.11)
RDW: 14.6 % (ref 11.5–15.5)
WBC: 7.2 10*3/uL (ref 4.0–10.5)

## 2011-01-30 LAB — PROTIME-INR: INR: 2.03 — ABNORMAL HIGH (ref 0.00–1.49)

## 2011-01-30 LAB — GLUCOSE, CAPILLARY: Glucose-Capillary: 114 mg/dL — ABNORMAL HIGH (ref 70–99)

## 2011-01-30 MED ORDER — WARFARIN SODIUM 2.5 MG PO TABS
2.5000 mg | ORAL_TABLET | Freq: Every day | ORAL | Status: DC
  Administered 2011-01-30 – 2011-01-31 (×2): 2.5 mg via ORAL
  Filled 2011-01-30 (×3): qty 1

## 2011-01-30 MED ORDER — ASPIRIN 81 MG PO CHEW
81.0000 mg | CHEWABLE_TABLET | Freq: Every day | ORAL | Status: DC
  Administered 2011-01-30 – 2011-02-01 (×3): 81 mg via ORAL
  Filled 2011-01-30 (×3): qty 1

## 2011-01-30 NOTE — Progress Notes (Signed)
9 Days Post-Op Procedure(s) (LRB): MITRAL VALVE REPAIR (MVR) (N/A) Subjective: No specific complaints  Objective: Vital signs in last 24 hours: Temp:  [97.5 F (36.4 C)-98.2 F (36.8 C)] 98.2 F (36.8 C) (12/07 0740) Pulse Rate:  [80-98] 94  (12/07 0800) Cardiac Rhythm:  [-] Heart block (12/07 0800) Resp:  [15-26] 21  (12/07 0800) BP: (97-125)/(40-75) 112/43 mmHg (12/07 0800) SpO2:  [90 %-100 %] 96 % (12/07 0800) Weight:  [64.3 kg (141 lb 12.1 oz)] 141 lb 12.1 oz (64.3 kg) (12/07 0651)  Hemodynamic parameters for last 24 hours:    Intake/Output from previous day: 12/06 0701 - 12/07 0700 In: 734 [P.O.:570; I.V.:164] Out: 1850 [Urine:1850] Intake/Output this shift:    General appearance: alert and cooperative Heart: regular rate and rhythm, S1, S2 normal, no murmur, click, rub or gallop Lungs: diminished breath sounds bibasilar Incisions healing well Mild bilateral leg edema  Lab Results:  Basename 01/30/11 0430 01/29/11 0317  WBC 7.2 7.7  HGB 9.2* 9.5*  HCT 28.2* 28.3*  PLT 188 189   BMET:  Basename 01/28/11 0318  NA 131*  K 4.1  CL 95*  CO2 28  GLUCOSE 115*  BUN 23  CREATININE 0.92  CALCIUM 8.6    PT/INR:  Basename 01/30/11 0430  LABPROT 23.3*  INR 2.03*   ABG    Component Value Date/Time   PHART 7.424* 01/26/2011 0925   HCO3 23.2 01/26/2011 0925   TCO2 24.3 01/26/2011 0925   ACIDBASEDEF 0.6 01/26/2011 0925   O2SAT 93.1 01/26/2011 0925   CBG (last 3)   Basename 01/29/11 2018  GLUCAP 120*    Assessment/Plan: S/P Procedure(s) (LRB): MITRAL VALVE REPAIR (MVR) (N/A) Mobilize Diuresis She is maintaining sinus rhythm off amiodarone. INR is therapeutic on coumadin Transfer to 2000 or PTCU   LOS: 9 days    BARTLE,BRYAN K 01/30/2011

## 2011-01-30 NOTE — Progress Notes (Signed)
Physical Therapy Treatment Patient Details Name: Desiree [name] MRN: [id number] DOB: [birth year] Today's Date: 01/30/2011  PT Assessment/Plan  PT - Assessment/Plan Comments on Treatment Session: Patient is moving slowly and limited by pain and fatigue.  Continue to progress pt. as able.  Will need SNF with therapy at D/C.  Patient did not meet any of goals of 01/12/11 secondary to multiple complications.  Goals revised today. PT Plan: Discharge plan remains appropriate;Frequency remains appropriate PT Frequency: Min 3X/week Follow Up Recommendations: Skilled nursing facility;24 hour supervision/assistance Equipment Recommended: Defer to next venue PT Goals  Acute Rehab PT Goals PT Goal Formulation: With patient Time For Goal Achievement: 2 weeks PT Goal: Rolling Supine to Right Side - Progress: Revised (modified due to lack of progress/goal met) PT Goal: Supine/Side to Sit - Progress: Revised (modified due to lack of progress/goal met) PT Goal: Sit to Supine/Side - Progress: Revised (modified due to lack of progress/goal met) PT Goal: Sit to Stand - Progress: Revised due to lack of progress PT Goal: Stand to Sit - Progress: Revised due to lack of progress PT Transfer Goal: Bed to Chair/Chair to Bed - Progress: Revised (modified due to lack of progress/goal met) PT Goal: Stand - Progress: Revised (modified due to lack of progress/goal met) PT Goal: Ambulate - Progress: Revised (modified due to lack of progress/goal met) Pt will Perform Home Exercise Program: with supervision, verbal cues required/provided PT Goal: Perform Home Exercise Program - Progress: Other (comment) Additional Goals Additional Goal #1: Patient to perform supine to sit with min guard assist and cues. PT Goal: Additional Goal #1 - Progress: Other (comment) Additional Goal #2: Patient to perform sit to stand with min guard assist and cues. PT Goal: Additional Goal #2 - Progress: Other (comment) Additional Goal #3: Patient  to ambulate 250 feet with RW with supervision. PT Goal: Additional Goal #3 - Progress: Other (comment)  PT Treatment Precautions/Restrictions  Precautions Precautions: Fall;Sternal Required Braces or Orthoses: No Restrictions Weight Bearing Restrictions: No Mobility (including Balance) Bed Mobility Rolling Right: Not tested (comment) Right Sidelying to Sit: Not tested (comment) Sitting - Scoot to Edge of Bed: Not tested (comment) Sit to Supine - Right: 1: +2 Total assist;Patient percentage (comment) (pt = 50%) Sit to Supine - Right Details (indicate cue type and reason): Assist for upper trunk and lower extremities Transfers Sit to Stand: 1: +2 Total assist;Patient percentage (comment);From chair/3-in-1 (pt = 60%) Sit to Stand Details (indicate cue type and reason): max cues for precautions and assist for anterior translation of pelvis Stand to Sit: 3: Mod assist;To bed Stand to Sit Details: Assist to control descent Stand Pivot Transfers: Not tested (comment) Ambulation/Gait Ambulation/Gait Assistance: 4: Min assist Ambulation/Gait Assistance Details (indicate cue type and reason): Pt. needs cues to stay close to RW and for pursed lip breathing throughout.  Pt. states she ambulated with shortened step length prior to admit. Ambulation Distance (Feet): 200 Feet Assistive device: Rolling walker Gait Pattern: Shuffle;Decreased stride length;Trunk flexed Stairs: No Wheelchair Mobility Wheelchair Mobility: No  Posture/Postural Control Posture/Postural Control: Postural limitations Postural Limitations: Pt. leans forward with flexed trunk. Balance Balance Assessed: No Exercise    End of Session PT - End of Session Equipment Utilized During Treatment: Gait belt Activity Tolerance: Patient limited by fatigue;Patient limited by pain Patient left: in bed;with call bell in reach Nurse Communication: Mobility status for ambulation General Behavior During Session: [facility] for tasks  performed Cognition: [facility] for tasks performed  INGOLD,Rossetta Kama 01/30/2011, 10:40 AM Isiah Scheel [place],PT Acute  Rehabilitation [phone] (pager)

## 2011-01-31 LAB — BODY FLUID CULTURE: Special Requests: 2

## 2011-01-31 LAB — CBC
MCH: 29.9 pg (ref 26.0–34.0)
MCHC: 31.9 g/dL (ref 30.0–36.0)
MCV: 93.8 fL (ref 78.0–100.0)
Platelets: 206 10*3/uL (ref 150–400)
RBC: 3.24 MIL/uL — ABNORMAL LOW (ref 3.87–5.11)

## 2011-01-31 NOTE — Progress Notes (Signed)
10 Days Post-Op Procedure(s) (LRB): MITRAL VALVE REPAIR (MVR) (N/A)  Subjective: Feels a little nauseated after taking meds.    Objective: Vital signs in last 24 hours: Patient Vitals for the past 24 hrs:  BP Temp Temp src Pulse Resp SpO2 Weight  01/31/11 0500 - 98.1 F (36.7 C) - - - - 64.2 kg (141 lb 8.6 oz)  01/31/11 0420 123/53 mmHg - - 92  20  92 % -  01/30/11 2229 136/50 mmHg 98 F (36.7 C) Oral 96  - 92 % -  01/30/11 1800 123/69 mmHg 98.2 F (36.8 C) Oral 87  20  91 % -  01/30/11 1628 - 97.8 F (36.6 C) Oral - - - -  01/30/11 1200 115/71 mmHg - - - - - -  01/30/11 1146 - 98 F (36.7 C) Oral - - - -   Current Weight  01/31/11 64.2 kg (141 lb 8.6 oz)     Intake/Output from previous day: 12/07 0701 - 12/08 0700 In: 240 [P.O.:240] Out: 1050 [Urine:1050]    PHYSICAL EXAM:  Heart: RRR Lungs: few crackles in bases Wound: clean and dry Extremities: + LE edema  Lab Results: CBC: Basename 01/31/11 0500 01/30/11 0430  WBC 7.2 7.2  HGB 9.7* 9.2*  HCT 30.4* 28.2*  PLT 206 188   BMET: No results found for this basename: NA:2,K:2,CL:2,CO2:2,GLUCOSE:2,BUN:2,CREATININE:2,CALCIUM:2 in the last 72 hours  PT/INR:  Basename 01/31/11 0500  LABPROT 25.8*  INR 2.31*     Assessment/Plan: S/P Procedure(s) (LRB): MITRAL VALVE REPAIR (MVR) (N/A) Vol overload/effusions- Diurese CV- AF, now SR, off Amio Coumadin for PE PT/OT    LOS: 10 days    Brianny Soulliere H 01/31/2011

## 2011-02-01 LAB — CBC
HCT: 29 % — ABNORMAL LOW (ref 36.0–46.0)
Hemoglobin: 9.4 g/dL — ABNORMAL LOW (ref 12.0–15.0)
MCH: 30.1 pg (ref 26.0–34.0)
MCHC: 32.4 g/dL (ref 30.0–36.0)
MCV: 92.9 fL (ref 78.0–100.0)
Platelets: 198 10*3/uL (ref 150–400)
RBC: 3.12 MIL/uL — ABNORMAL LOW (ref 3.87–5.11)
RDW: 14.5 % (ref 11.5–15.5)
WBC: 6.9 10*3/uL (ref 4.0–10.5)

## 2011-02-01 LAB — BASIC METABOLIC PANEL
BUN: 10 mg/dL (ref 6–23)
CO2: 32 mEq/L (ref 19–32)
Calcium: 8.6 mg/dL (ref 8.4–10.5)
Chloride: 94 mEq/L — ABNORMAL LOW (ref 96–112)
Creatinine, Ser: 0.76 mg/dL (ref 0.50–1.10)
GFR calc Af Amer: 87 mL/min — ABNORMAL LOW (ref >90)
GFR calc non Af Amer: 75 mL/min — ABNORMAL LOW (ref >90)
Glucose, Bld: 80 mg/dL (ref 70–99)
Potassium: 3.6 mEq/L (ref 3.5–5.1)
Sodium: 133 mEq/L — ABNORMAL LOW (ref 135–145)

## 2011-02-01 MED ORDER — POLYETHYLENE GLYCOL 3350 17 G PO PACK
17.0000 g | PACK | Freq: Every day | ORAL | Status: DC | PRN
  Administered 2011-02-09: 17 g via ORAL
  Filled 2011-02-01: qty 1

## 2011-02-01 MED ORDER — FLEET ENEMA 7-19 GM/118ML RE ENEM
1.0000 | ENEMA | Freq: Once | RECTAL | Status: AC
  Administered 2011-02-01: 1 via RECTAL
  Filled 2011-02-01: qty 1

## 2011-02-01 MED ORDER — WARFARIN SODIUM 1 MG PO TABS
1.0000 mg | ORAL_TABLET | Freq: Every day | ORAL | Status: DC
  Administered 2011-02-01: 1 mg via ORAL
  Filled 2011-02-01 (×2): qty 1

## 2011-02-01 NOTE — Progress Notes (Addendum)
11 Days Post-Op Procedure(s) (LRB): MITRAL VALVE REPAIR (MVR) (N/A)  Subjective: Nauseated, c/o constipation and pain this am.  Weak after ambulating.  Objective: Vital signs in last 24 hours: Patient Vitals for the past 24 hrs:  BP Temp Temp src Pulse Resp SpO2 Weight  02/01/11 0502 110/69 mmHg 97.7 F (36.5 C) Oral 86  20  93 % 63.6 kg (140 lb 3.4 oz)  01/31/11 2120 113/69 mmHg 97.6 F (36.4 C) Oral 87  20  91 % -  01/31/11 1445 105/66 mmHg 97.8 F (36.6 C) Oral 85  20  90 % -   Current Weight  02/01/11 63.6 kg (140 lb 3.4 oz)     Intake/Output from previous day: 12/08 0701 - 12/09 0700 In: 720 [P.O.:720] Out: 150 [Urine:150]    PHYSICAL EXAM:  Heart: RRR Lungs: diminished BS in bases Wound: clean and dry Extremities: +LE edema  Lab Results: CBC: Basename 02/01/11 0600 01/31/11 0500  WBC 6.9 7.2  HGB 9.4* 9.7*  HCT 29.0* 30.4*  PLT 198 206   BMET:  Basename 02/01/11 0600  NA 133*  K 3.6  CL 94*  CO2 32  GLUCOSE 80  BUN 10  CREATININE 0.76  CALCIUM 8.6    PT/INR:  Basename 02/01/11 0600  LABPROT 28.8*  INR 2.66*     Assessment/Plan: S/P Procedure(s) (LRB): MITRAL VALVE REPAIR (MVR) (N/A) GI- will RX LOC today and monitor nausea CV- Maintaining SR off Amio Coumadin for PE- INR up today- will decrease Coumadin to 1 mg as she is not eating well. Disp-SNF at d/c    LOS: 11 days    COLLINS,GINA H 02/01/2011  Pt.seen and examined. Agree with above  c/o constipation and nausea. No result with dulcolax and colace. Will try Fleet's and, if necessary, miralax

## 2011-02-02 LAB — CBC
Hemoglobin: 9.9 g/dL — ABNORMAL LOW (ref 12.0–15.0)
MCH: 30.7 pg (ref 26.0–34.0)
MCV: 93.2 fL (ref 78.0–100.0)
RBC: 3.23 MIL/uL — ABNORMAL LOW (ref 3.87–5.11)

## 2011-02-02 MED ORDER — MAGNESIUM CITRATE PO SOLN
1.0000 | Freq: Once | ORAL | Status: AC
  Administered 2011-02-02: 1 via ORAL
  Filled 2011-02-02: qty 296

## 2011-02-02 NOTE — Progress Notes (Signed)
   CARDIOTHORACIC SURGERY PROGRESS NOTE  12 Days Post-Op  S/P Procedure(s) (LRB): MITRAL VALVE REPAIR (MVR) (N/A)  Subjective: Looks and feels better, but slow progress. Still no BM despite enema yesterday.  Objective: Vital signs in last 24 hours: Temp:  [97.6 F (36.4 C)-98.1 F (36.7 C)] 97.6 F (36.4 C) (12/10 0546) Pulse Rate:  [86-92] 86  (12/10 0546) Cardiac Rhythm:  [-] Heart block (12/10 0800) Resp:  [18-20] 18  (12/10 0546) BP: (118-127)/(61-73) 122/69 mmHg (12/10 0546) SpO2:  [90 %-94 %] 93 % (12/10 0546) Weight:  [62.506 kg (137 lb 12.8 oz)] 137 lb 12.8 oz (62.506 kg) (12/10 0600)  Physical Exam:  Rhythm:   sinus  Breath sounds: clear  Heart sounds:  RRR with no murmur  Incisions:  Clean and dry  Abdomen:  soft  Extremities:  warm   Intake/Output from previous day: 12/09 0701 - 12/10 0700 In: 480 [P.O.:480] Out: 350 [Urine:350] Intake/Output this shift: Total I/O In: 240 [P.O.:240] Out: -   Lab Results:  Basename 02/02/11 0500 02/01/11 0600  WBC 7.2 6.9  HGB 9.9* 9.4*  HCT 30.1* 29.0*  PLT 209 198   BMET:  Basename 02/01/11 0600  NA 133*  K 3.6  CL 94*  CO2 32  GLUCOSE 80  BUN 10  CREATININE 0.76  CALCIUM 8.6    CBG (last 3)   Basename 02/02/11 0557 01/30/11 2214 01/30/11 1626  GLUCAP 86 123* 114*   PT/INR:   Basename 02/02/11 0500  LABPROT 36.1*  INR 3.56*    CXR:  N/A  Assessment/Plan: S/P Procedure(s) (LRB): MITRAL VALVE REPAIR (MVR) (N/A)  Slow progress but improved. INR supratherapeutic today. Constipation. Generalized weakness, malnutrition, poor po intake  Hold coumadin D/C aspirin (no indication) Try magnesium citrate Continue rehab Possible d/c to SNF 2-3 days  Desiree [name] 02/02/2011

## 2011-02-02 NOTE — Progress Notes (Signed)
CSW sent updated FL2 and progress notes to [facility] in anticipation of d/c this week. CSW in contact with pt daughter to coordinate d/c plans. CSW will continue to follow.   Baxter Flattery, MSW [phone]

## 2011-02-02 NOTE — Progress Notes (Signed)
Physical Therapy Treatment Patient Details Name: Desiree [name] MRN: [id number] DOB: Sep 19, 1924 Today's Date: 02/02/2011  PT Assessment/Plan  PT - Assessment/Plan Comments on Treatment Session: Patient is limited mostly by poor endurance for activity as well as poor strength for activity.  Will continue to progress as pt. tolerates.  Continues to need SNF.   PT Plan: Discharge plan remains appropriate;Frequency remains appropriate PT Frequency: Min 3X/week Follow Up Recommendations: 24 hour supervision/assistance;Skilled nursing facility Equipment Recommended: Defer to next venue PT Goals  Acute Rehab PT Goals PT Goal Formulation: With patient PT Goal: Rolling Supine to Right Side - Progress: Other (comment) PT Goal: Supine/Side to Sit - Progress: Other (comment) PT Goal: Sit to Supine/Side - Progress: Other (comment) PT Goal: Sit to Stand - Progress: Progressing toward goal PT Goal: Stand to Sit - Progress: Progressing toward goal PT Transfer Goal: Bed to Chair/Chair to Bed - Progress: Progressing toward goal PT Goal: Stand - Progress: Progressing toward goal PT Goal: Ambulate - Progress: Progressing toward goal PT Goal: Perform Home Exercise Program - Progress: Progressing toward goal Additional Goals PT Goal: Additional Goal #1 - Progress: Progressing toward goal PT Goal: Additional Goal #2 - Progress: Progressing toward goal PT Goal: Additional Goal #3 - Progress: Progressing toward goal  PT Treatment Precautions/Restrictions  Precautions Precautions: Fall;Sternal Required Braces or Orthoses: No Restrictions Weight Bearing Restrictions: No Mobility (including Balance) Bed Mobility Rolling Right: Not tested (comment) Right Sidelying to Sit: Not tested (comment) Sitting - Scoot to Edge of Bed: Not tested (comment) Sit to Supine - Right: Not Tested (comment) Transfers Sit to Stand: 3: Mod assist;Without upper extremity assist;From chair/3-in-1 Sit to Stand Details (indicate  cue type and reason): Needed incr. assist from low chair with pt. continuing to have difficulty with anterior translation of pelvis. Stand to Sit: 3: Mod assist;To chair/3-in-1 Stand to Sit Details: Assist to control descent. Stand Pivot Transfers: Not tested (comment) Ambulation/Gait Ambulation/Gait Assistance: 3: Mod assist Ambulation/Gait Assistance Details (indicate cue type and reason): Pt. generally unsteady today with little to no balance reactions when she has LOB.  Patient demonstrating poor endurance overall.  Patient ambulated around circle on 2000 with RW with cues to stay close to RW and for pursed lip breathing.  Pt. needed cues to take larger steps as she shuffles with somewhat narrow BOS.  Toward end of walk, pt. demonstrated knee buclkling bil feet needing moderate assist and cues to get back to chair.  Pt. reports history of back pain and "my back just gave out".   Ambulation Distance (Feet): 200 Feet Assistive device: Rolling walker Gait Pattern: Step-to pattern;Decreased stride length;Shuffle;Trunk flexed Stairs: No Wheelchair Mobility Wheelchair Mobility: No  Posture/Postural Control Posture/Postural Control: Postural limitations Postural Limitations: Flexed trunk Balance Balance Assessed: No Exercise    End of Session PT - End of Session Equipment Utilized During Treatment: Gait belt Activity Tolerance: Patient limited by fatigue;Patient limited by pain Patient left: in chair;with call bell in reach;with family/visitor present Nurse Communication: Mobility status for ambulation General Behavior During Session: [facility] for tasks performed Cognition: [facility] for tasks performed  INGOLD,Markel Kurtenbach 02/02/2011, 1:57 PM [facility] [phone] (pager)

## 2011-02-02 NOTE — Progress Notes (Signed)
CARDIAC REHAB PHASE I   PRE:  Rate/Rhythm: 90SR  BP:  Supine:   Sitting: 128/52  Standing:    SaO2: 92%RA  MODE:  Ambulation: 300 ft   POST:  Rate/Rhythem: 109  BP:  Supine:   Sitting: 139/56  Standing:    SaO2: 91%RA  [id number] Pt walked 150 ft on RA with rolling walker and asst x 2. We followed with chair. Sat down to rest at 150 ft and then able to walk another 150 ft to total 300 ft. Very tired by the end of walk and had to rest in room before getting to recliner. Call bell in reach. Did well on RA.  Desiree [name]

## 2011-02-02 NOTE — Progress Notes (Signed)
Occupational Therapy Treatment Patient Details Name: Desiree [name] MRN: [id number] DOB: [birth year] Today's Date: 02/02/2011  OT Assessment/Plan OT Assessment/Plan Comments on Treatment Session: Pt. completed toileting on bedside commode and then ambulated ~ 10' with RW to sink and back to sitting up in chair with mod verbal cues throughout and tactile cues for safety with RW when manuvering around objects. OT Plan: Discharge plan remains appropriate OT Frequency: Min 1X/week Follow Up Recommendations: Skilled nursing facility Equipment Recommended: Defer to next venue OT Goals Acute Rehab OT Goals OT Goal Formulation: With patient Time For Goal Achievement: 2 weeks ADL Goals Pt Will Perform Grooming: with supervision;Standing at sink ADL Goal: Grooming - Progress: Progressing toward goals Pt Will Perform Lower Body Bathing: with min assist;Sit to stand from chair ADL Goal: Lower Body Bathing - Progress: Not addressed Pt Will Perform Lower Body Dressing: Sit to stand from bed;with min assist ADL Goal: Lower Body Dressing - Progress: Not addressed Pt Will Transfer to Toilet: with supervision;Ambulation;Comfort height toilet ADL Goal: Toilet Transfer - Progress: Progressing toward goals Pt Will Perform Toileting - Clothing Manipulation: with supervision;Standing ADL Goal: Toileting - Clothing Manipulation - Progress: Progressing toward goals Pt Will Perform Toileting - Hygiene: with supervision;Sit to stand from 3-in-1/toilet ADL Goal: Toileting - Hygiene - Progress: Met Pt Will Perform Tub/Shower Transfer: Shower transfer;with supervision;Ambulation;Shower seat with back ADL Goal: Web designer - Progress: Not addressed Miscellaneous OT Goals Miscellaneous OT Goal #1: pt will state 2 energy conservation techniques OT Goal: Miscellaneous Goal #1 - Progress: Not met  OT Treatment Precautions/Restrictions  Precautions Precautions: Fall;Sternal Required Braces or Orthoses:  No Restrictions Weight Bearing Restrictions: No   ADL ADL Grooming: Performed;Set up;Minimal assistance Grooming Details (indicate cue type and reason): With min assist balance while standing at sink and mod verbal cues for use of RW and safety with following sternal precautions with reaching Where Assessed - Grooming: Standing at sink Toilet Transfer: Performed;Minimal assistance Toilet Transfer Details (indicate cue type and reason): mod verbal cues for hand placement on knees to follow sternal precautions Toilet Transfer Method: Stand pivot Toilet Transfer Equipment: Bedside commode Toileting - Clothing Manipulation: Performed;Moderate assistance Toileting - Clothing Manipulation Details (indicate cue type and reason): With moving gown Where Assessed - Toileting Clothing Manipulation: Sit to stand from 3-in-1 or toilet Toileting - Hygiene: Performed;Set up;Minimal assistance Toileting - Hygiene Details (indicate cue type and reason): Min assist for balance Where Assessed - Toileting Hygiene: Sit to stand from 3-in-1 or toilet Tub/Shower Transfer: Not assessed Mobility  Bed Mobility Bed Mobility: No Rolling Right: Not tested (comment) Right Sidelying to Sit: Not tested (comment) Sitting - Scoot to Edge of Bed: Not tested (comment) Sit to Supine - Right: Not Tested (comment) Transfers Transfers: Yes Sit to Stand: 3: Mod assist;Without upper extremity assist;From chair/3-in-1 Sit to Stand Details (indicate cue type and reason): Mod verbal cues for hand placement on knees Stand to Sit: 3: Mod assist;To chair/3-in-1 Stand to Sit Details: Assist to control descent. Exercises    End of Session OT - End of Session Equipment Utilized During Treatment: Gait belt Activity Tolerance: Patient limited by pain;Patient limited by fatigue Patient left: in chair;with call bell in reach;with family/visitor present Nurse Communication: Mobility status for transfers General Behavior During  Session: [facility] for tasks performed Cognition: [facility] for tasks performed  Angeldejesus Callaham, OTR/L Pager [phone]  02/02/2011, 3:08 PM

## 2011-02-03 ENCOUNTER — Inpatient Hospital Stay (HOSPITAL_COMMUNITY): Payer: 59

## 2011-02-03 DIAGNOSIS — J90 Pleural effusion, not elsewhere classified: Secondary | ICD-10-CM | POA: Diagnosis not present

## 2011-02-03 LAB — BASIC METABOLIC PANEL
CO2: 31 mEq/L (ref 19–32)
GFR calc non Af Amer: 75 mL/min — ABNORMAL LOW (ref >90)
Glucose, Bld: 75 mg/dL (ref 70–99)
Potassium: 3 mEq/L — ABNORMAL LOW (ref 3.5–5.1)
Sodium: 134 mEq/L — ABNORMAL LOW (ref 135–145)

## 2011-02-03 MED ORDER — POTASSIUM CHLORIDE 20 MEQ/15ML (10%) PO LIQD
40.0000 meq | Freq: Three times a day (TID) | ORAL | Status: DC
  Administered 2011-02-03 (×2): 40 meq via ORAL
  Filled 2011-02-03 (×5): qty 30

## 2011-02-03 MED ORDER — POTASSIUM CHLORIDE 20 MEQ/15ML (10%) PO LIQD
40.0000 meq | Freq: Two times a day (BID) | ORAL | Status: DC
  Administered 2011-02-03: 40 meq via ORAL
  Filled 2011-02-03 (×2): qty 30

## 2011-02-03 MED ORDER — FUROSEMIDE 40 MG PO TABS
40.0000 mg | ORAL_TABLET | Freq: Two times a day (BID) | ORAL | Status: DC
  Administered 2011-02-03 – 2011-02-10 (×14): 40 mg via ORAL
  Filled 2011-02-03 (×16): qty 1

## 2011-02-03 MED ORDER — METOPROLOL TARTRATE 25 MG PO TABS: 25.0000 mg | ORAL_TABLET | Freq: Two times a day (BID) | ORAL | Status: DC

## 2011-02-03 MED ORDER — FUROSEMIDE 10 MG/ML IJ SOLN
40.0000 mg | Freq: Once | INTRAMUSCULAR | Status: AC
  Administered 2011-02-03: 40 mg via INTRAVENOUS
  Filled 2011-02-03: qty 4

## 2011-02-03 MED ORDER — TRAMADOL HCL 50 MG PO TABS: 50.0000 mg | ORAL_TABLET | Freq: Four times a day (QID) | ORAL | Status: DC | PRN

## 2011-02-03 MED ORDER — FUROSEMIDE 40 MG PO TABS: 40.0000 mg | ORAL_TABLET | Freq: Every day | ORAL | Status: DC

## 2011-02-03 MED ORDER — POTASSIUM CHLORIDE 20 MEQ/15ML (10%) PO LIQD: 20.0000 meq | Freq: Two times a day (BID) | ORAL | Status: DC

## 2011-02-03 NOTE — Progress Notes (Addendum)
13 Days Post-Op  Procedure(s) (LRB): MITRAL VALVE REPAIR (MVR) (N/A) Subjective: Slowly feeling stronger  Objective  Telemetry NSR   Temp:  [98.3 F (36.8 C)-98.6 F (37 C)] 98.5 F (36.9 C) (12/11 0313) Pulse Rate:  [80-88] 85  (12/11 0313) Resp:  [18-19] 19  (12/11 0313) BP: (102-124)/(49-62) 102/49 mmHg (12/11 0313) SpO2:  [91 %-93 %] 91 % (12/11 0313) Weight:  [136 lb 7.4 oz (61.9 kg)] 136 lb 7.4 oz (61.9 kg) (12/11 0313)   Intake/Output Summary (Last 24 hours) at 02/03/11 0806 Last data filed at 02/02/11 2156  Gross per 24 hour  Intake    243 ml  Output      3 ml  Net    240 ml       General appearance: alert, cooperative and no distress Heart: regular rate and rhythm, S1, S2 normal, no murmur, click, rub or gallop Lungs: diminished in the left base, otherwise clear Abdomen: soft, non-tender; bowel sounds normal; no masses,  no organomegaly Extremities: 1+ BLE edema Wound: incisions healing well  Lab Results:  Basename 02/03/11 0500 02/01/11 0600  NA 134* 133*  K 3.0* 3.6  CL 94* 94*  CO2 31 32  GLUCOSE 75 80  BUN 9 10  CREATININE 0.77 0.76  CALCIUM 8.7 8.6  MG -- --  PHOS -- --   No results found for this basename: AST:2,ALT:2,ALKPHOS:2,BILITOT:2,PROT:2,ALBUMIN:2 in the last 72 hours No results found for this basename: LIPASE:2,AMYLASE:2 in the last 72 hours  Basename 02/02/11 0500 02/01/11 0600  WBC 7.2 6.9  NEUTROABS -- --  HGB 9.9* 9.4*  HCT 30.1* 29.0*  MCV 93.2 92.9  PLT 209 198   No results found for this basename: CKTOTAL:4,CKMB:4,TROPONINI:4 in the last 72 hours No components found with this basename: POCBNP:3 No results found for this basename: DDIMER in the last 72 hours No results found for this basename: HGBA1C in the last 72 hours No results found for this basename: CHOL,HDL,LDLCALC,TRIG,CHOLHDL in the last 72 hours No results found for this basename: TSH,T4TOTAL,FREET3,T3FREE,THYROIDAB in the last 72 hours No results found for  this basename: VITAMINB12,FOLATE,FERRITIN,TIBC,IRON,RETICCTPCT in the last 72 hours  Medications: Scheduled    . docusate sodium  200 mg Oral Daily  . feeding supplement  237 mL Oral TID WC  . furosemide  40 mg Oral Daily  . levothyroxine  75 mcg Oral Daily  . magnesium citrate  1 Bottle Oral Once  . metoprolol tartrate  25 mg Oral BID  . pantoprazole  40 mg Oral QAC breakfast  . povidone-iodine  1 application Topical BID  . sodium chloride  10 mL Intracatheter Q12H  . warfarin   Does not apply Once  . DISCONTD: aspirin  81 mg Oral Daily  . DISCONTD: warfarin  1 mg Oral q1800     Radiology/Studies:  No results found.  INR:2.94 Will add last result for INR, ABG once components are confirmed Will add last 4 CBG results once components are confirmed  Assessment/Plan: S/P Procedure(s) (LRB): MITRAL VALVE REPAIR (MVR) (N/A) 1. Feeling stronger 2. Conts diuresis, replace K+, Left effusion, may need to consider thoracentesis 3.hold coumadin today. 4. Poss SNF 1-2 days   LOS: 13 days    GOLD,WAYNE E 12/11/20128:06 AM  I have seen and examined the patient and agree with the assessment and plan as outlined.  However, given the relatively rapid re accumulation of right pleural effusion, will consider Pleur-X catheter placement on Thursday depending upon INR.  Recheck CXR tomorrow.  OWEN,CLARENCE H 02/03/2011 4:49 PM

## 2011-02-03 NOTE — Progress Notes (Signed)
CARDIAC REHAB PHASE I   PRE:  Rate/Rhythm: 83 SR    BP: sitting 120/50    SaO2: 94 RA  MODE:  Ambulation: 300 ft   POST:  Rate/Rhythm: 90 SR    BP: sitting 107/44     SaO2: 94  RA  Pt continues to be weak. C/o nausea walking, esp with cleaner smells. Had to sit x2. VSS, return to recliner. [id number]  Harriet Masson CES, ACSM

## 2011-02-03 NOTE — Progress Notes (Addendum)
The patient was put on 2L because her O2 was 88% while resting in bed. She was not symptomatic. O2 sats increased to 93% with 2L. She should easily progress back to room air. Will continue to monitor.

## 2011-02-03 NOTE — Discharge Summary (Signed)
I agree with the above discharge summary and plan for follow-up.  Desiree [name] H  

## 2011-02-03 NOTE — Progress Notes (Signed)
Patient has refused walk at this time due to pain.  Will continue to monitor.

## 2011-02-03 NOTE — Progress Notes (Signed)
Nutrition Follow-up  Diet Order:  Dysphagia 3-Thin, 25-50% intake recorded per chart. Patient reports her appetite has improved, but intake remains fair due to food preferences and tiring of menu options.   Supplement: Ensure Clinical Strength TID, patient reports drinking 2-3 daily.   Suspect that with PO intake of meals and supplements, patient is meeting estimated needs.   Meds: Scheduled Meds:   . docusate sodium  200 mg Oral Daily  . feeding supplement  237 mL Oral TID WC  . furosemide  40 mg Oral Daily  . levothyroxine  75 mcg Oral Daily  . metoprolol tartrate  25 mg Oral BID  . pantoprazole  40 mg Oral QAC breakfast  . potassium chloride  40 mEq Oral BID  . povidone-iodine  1 application Topical BID  . sodium chloride  10 mL Intracatheter Q12H  . warfarin   Does not apply Once   Continuous Infusions:  PRN Meds:.sodium chloride, acetaminophen, alum & mag hydroxide-simeth, bisacodyl, bisacodyl, magnesium hydroxide, ondansetron (ZOFRAN) IV, ondansetron, polyethylene glycol, sodium chloride, traMADol  Labs:  CMP     Component Value Date/Time   NA 134* 02/03/2011 0500   K 3.0* 02/03/2011 0500   CL 94* 02/03/2011 0500   CO2 31 02/03/2011 0500   GLUCOSE 75 02/03/2011 0500   BUN 9 02/03/2011 0500   CREATININE 0.77 02/03/2011 0500   CALCIUM 8.7 02/03/2011 0500   PROT 6.9 01/19/2011 1241   ALBUMIN 3.5 01/19/2011 1241   AST 22 01/19/2011 1241   ALT 22 01/19/2011 1241   ALKPHOS 97 01/19/2011 1241   BILITOT 0.4 01/19/2011 1241   GFRNONAA 75* 02/03/2011 0500   GFRAA 87* 02/03/2011 0500     Intake/Output Summary (Last 24 hours) at 02/03/11 1320 Last data filed at 02/03/11 0800  Gross per 24 hour  Intake    483 ml  Output      3 ml  Net    480 ml    Weight Status:  61.9 kg - 8 pound weight loss, due to fluid (s/p thoracentesis)  Nutrition Dx:  Inadequate oral intake, improving  Goal:  Meet >90% estimated nutrition needs, meeting  Intervention:   1. Continue current  diet and supplements.  2. Patient encouraged to order meals from "Additional Items" section of the menu to promote adequate intake.   Monitor:  PO intake, weight, labs, wound care   Linnell Fulling [facility] Pager #:  [phone]

## 2011-02-03 NOTE — Discharge Summary (Signed)
Desiree [name] 11/29/1924 77 y.o. [id number]  01/21/2011   Purcell Nails, MD  mitral regurgitation  HPI: (At Time of consultation)Patient is an 77 year old female from [place] with history of mitral valve prolapse followed by Dr. [name]. She also has hypertension and chronic back pain due to osteoporosis with degenerative arthritis afflicting her spine. The patient has remained remarkably physically active and completely functionally independent. 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 The patient has been under considerable stress recently do to the illness and ultimate loss of her brother who passed away recently. She has developed exertional shortness of breath relatively acutely, and was admitted to the hospital on 01/08/2011 with class IV congestive heart failure. 2-D echocardiogram performed 01/09/2011 revealed mitral regurgitation with normal left ventricular systolic function and mild left ventricular hypertrophy. The patient's symptoms improved with diuretic therapy. Transesophageal echocardiogram was performed 01/13/2011 demonstrating mitral valve prolapse with flail segment of the posterior leaflet of the mitral valve and severe (4+) mitral regurgitation. Cardiothoracic surgical consultation has been requested to consider surgical intervention.   Past Medical History   Diagnosis  Date   .  Diverticulitis      chronic issues with consitpation and diarrhea   .  Hypertension      well controlled usually   .  Hypothyroid    .  Anemia    .  Hypercholesterolemia    .  Hypothyroidism    .  Osteoarthritis      chronic-on multiple pain meds.   .  Paroxysmal atrial fibrillation      not on coumadin-Cards=Tilley-saw him ?1 yr ago   .  CHF (congestive heart failure), NYHA class II      has sudden onset decom CHF? 2/2 to PNA   .  Lumbar disc disease     Past Surgical History   Procedure  Date   .  Carotid  endarterectomy    .  Carpal tunnel release    .  Rotator cuff repair      2 on right, 1 on left   .  Abdominal hysterectomy    .  Cataract extraction    .  Knee surgery      bilateral   History reviewed. No pertinent family history.  Social History  History   Substance Use Topics   .  Smoking status:  Never Smoker   .  Smokeless tobacco:  Never Used   .  Alcohol Use:  No    Current Facility-Administered Medications   Medication  Dose  Route  Frequency  Provider  Last Rate  Last Dose   .  0.9 % sodium chloride infusion   Intravenous  Continuous  W Ashley Royalty., MD     .  acetaminophen (TYLENOL) tablet 650 mg  650 mg  Oral  Q6H PRN  Lonia Farber, MD   650 mg at 01/10/11 1715   .  aspirin chewable tablet 324 mg  324 mg  Oral  Pre-Cath  W Ashley Royalty., MD   324 mg at 01/14/11 0559   .  diazepam (VALIUM) tablet 5 mg  5 mg  Oral  On Call  W Ashley Royalty., MD     .  furosemide (LASIX) tablet 40 mg  40 mg  Oral  Daily  W Ashley Royalty., MD   40 mg at 01/14/11 0954   .  HYDROcodone-acetaminophen (NORCO) 5-325  MG per tablet 1 tablet  1 tablet  Oral  Q6H PRN  Pleas Koch, MD   1 tablet at 01/14/11 0559   .  levothyroxine (SYNTHROID, LEVOTHROID) tablet 75 mcg  75 mcg  Oral  Daily  Pleas Koch, MD   75 mcg at 01/14/11 0954   .  metoprolol (LOPRESSOR) tablet 50 mg  50 mg  Oral  BID  Pleas Koch, MD   50 mg at 01/14/11 0954   .  moxifloxacin (AVELOX) tablet 400 mg  400 mg  Oral  q1800  Anders Simmonds, NP   400 mg at 01/13/11 1822   .  pantoprazole (PROTONIX) EC tablet 40 mg  40 mg  Oral  Q1200  Shan Levans, MD   40 mg at 01/13/11 0912   .  sodium chloride 0.9 % injection 3 mL  3 mL  Intravenous  Q12H  W Ashley Royalty., MD   3 mL at 01/14/11 0954   .  spironolactone (ALDACTONE) tablet 25 mg  25 mg  Oral  Daily  Gerrit Friends. Rothbart, MD   25 mg at 01/14/11 0954   .  DISCONTD: 0.45 % sodium chloride infusion   Intravenous  Continuous  W Ashley Royalty., MD  10 mL/hr  at 01/13/11 [phone]    .  DISCONTD: 0.9 % sodium chloride infusion  250 mL  Intravenous  PRN  Pleas Koch, MD     .  DISCONTD: 0.9 % sodium chloride infusion  250 mL  Intravenous  Continuous  W Ashley Royalty., MD     .  DISCONTD: 0.9 % sodium chloride infusion  250 mL  Intravenous  Continuous  W Ashley Royalty., MD  1 mL/hr at 01/12/11 1710  250 mL at 01/12/11 1710   .  DISCONTD: aspirin EC tablet 81 mg  81 mg  Oral  Daily  Pleas Koch, MD   81 mg at 01/13/11 0911   .  DISCONTD: benzocaine (HURRICAINE) 20 % mouth spray 1 application  1 application  Mouth/Throat  PRN  W Ashley Royalty., MD     .  DISCONTD: butamben-tetracaine-benzocaine (CETACAINE) spray    PRN  Wendall Stade, MD   2 spray at 01/13/11 1358   .  DISCONTD: diazepam (VALIUM) tablet 5 mg  5 mg  Oral  On Call  W Ashley Royalty., MD     .  DISCONTD: diphenhydrAMINE (BENADRYL) 12.5 MG/5ML elixir 6.25 mg  6.25 mg  Oral  Q8H PRN  Pleas Koch, MD   12.5 mg at 01/08/11 1948   .  DISCONTD: fentaNYL (SUBLIMAZE) 0.05 MG/ML injection         .  DISCONTD: fentaNYL (SUBLIMAZE) injection 250 mcg  250 mcg  Intravenous  Once  W Ashley Royalty., MD     .  DISCONTD: furosemide (LASIX) injection 40 mg  40 mg  Intravenous  BID  Gerrit Friends. Rothbart, MD   40 mg at 01/13/11 0800   .  DISCONTD: midazolam (VERSED) 10 MG/2ML injection 10 mg  10 mg  Intravenous  Once  W Ashley Royalty., MD     .  DISCONTD: midazolam (VERSED) 10 MG/2ML injection    PRN  Wendall Stade, MD   2 mg at 01/13/11 1401   .  DISCONTD: moxifloxacin (AVELOX) tablet 400 mg  400 mg  Oral  q1800  Shan Levans, MD   400 mg at 01/12/11 1709   .  DISCONTD: sodium chloride  0.9 % injection 3 mL  3 mL  Intravenous  Q12H  W Ashley Royalty., MD     .  DISCONTD: sodium chloride 0.9 % injection 3 mL  3 mL  Intravenous  PRN  W Ashley Royalty., MD     .  DISCONTD: sodium chloride 0.9 % injection 3 mL  3 mL  Intravenous  PRN  W Ashley Royalty., MD   3 mL at 01/12/11 2134     Allergies   Allergen  Reactions   .  Celebrex (Celecoxib)  Other (See Comments)     Reaction=anemia/leukopenia   .  Diovan (Valsartan)  Other (See Comments)     Reaction=increase potassium/acute renal failure   Review of Systems: at time of consultation General: Decreased appetite, normal energy, 3-5 lb weight loss over several months  Respiratory: no cough, no wheezing, no hemoptysis, no pain with inspiration or cough  Cardiac: no chest pain or tightness, recent onset exertional SOB with resting SOB at the time of admission that has since improved, no PND, no orthopnea, recent onset mild LE edema, no palpitations, no syncope  GI: no difficulty swallowing, no hematochezia, no hematemesis, no melena, some constipation, occasional diarrhea  GU: no dysuria, no urgency, no frequency  Musculoskeletal: Chronic back pain due to arthritis, stable  Vascular: no pain suggestive of claudication  Neuro: no symptoms suggestive of TIA's, no seizures, no headaches, no peripheral neuropathy  Endocrine: Negative  HEENT: no loose teeth or painful teeth, some recent vision loss both eyes  Psych: no anxiety, some depression related to brother's recent passing  Physical Exam: at time of consultation BP 113/68  Pulse 84  Temp(Src) 98.3 F (36.8 C) (Oral)  Resp 18  Ht 5\' 3"  (1.6 m)  Wt 56.654 kg (124 lb 14.4 oz)  BMI 22.12 kg/m2  SpO2 97%  General: Elderly, frail, but well-appearing  HEENT: Unremarkable  Neck: no JVD, right scar from CEA with bruit, no adenopathy  Chest: clear to auscultation, symmetrical breath sounds, no wheezes, no rhonchi  CV: RRR, grade III/VI holosystolic murmur best at right sternal border  Abdomen: soft, non-tender, no masses  Extremities: warm, well-perfused, pulses palpable in groin, non-palpable at ankle  Rectal/GU Deferred  Neuro: Grossly non-focal and symmetrical throughout  Skin: Clean and dry, no rashes, no breakdown  Diagnostic Tests:  Both transthoracic and  transesophageal echocardiograms are reviewed. These demonstrate mitral valve prolapse with mitral regurgitation and normal left ventricular systolic function. Images are best on transesophageal echocardiogram where there is obvious flail leaflet of the posterior leaflet of the mitral valve and severe mitral regurgitation. 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 Jet of regurgitation courses anteriorly around the left atrium. There is some left atrial enlargement. There is trace aortic insufficiency. There is mild aortic valve sclerosis without aortic stenosis. There is mild left ventricular hypertrophy. Right ventricular size and function is normal. There is trivial tricuspid regurgitation.  Left and right heart catheterization performed by Dr. [name] 01/14/2011 is reviewed. This demonstrates normal coronary artery anatomy with no significant coronary artery disease. There was normal left ventricular systolic function. There was severe mitral regurgitation with moderate mitral annular calcification. Imaging of the descending thoracic and abdominal aorta demonstrates fairly significant atherosclerotic plaque although there is only mild narrowing of the distal aorta. Right heart catheterization is notable for pulmonary artery pressures measured 25/10 pulmonary Lid wedge pressure of 8.  Hospital course:  The patient was admitted and taken to the operating room where she underwent the following procedure:   Date of Procedure: 01/21/2011  Preoperative Diagnosis: Severe Mitral Regurgitation  Postoperative Diagnosis: Same  Procedure:  Mitral Valve Repair  Complex valvuloplasty including artificial Gore-tex neocord replacement x2 and closure of cleft between P2 and P3  26 mm Sorin Memo 3D ring annuloplasty Surgeon: Salvatore Decent. Cornelius Moras, MD  Assistant: Al Corpus, CSFA  Anesthesiologist: Kipp Brood, MD  Operative  Findings:  Type II dysfunction with severe mitral regurgitation  Fibroelastic deficiency type degenerative disease  Flail portion of posterior leaflet (P2)  Severe calcification of posterior mitral annulus  Normal left ventricular systolic function  No residual mitral regurgitation following successful valve repair   She tolerated the procedure well was taken to the surgical intensive care unit in stable condition.  Postoperative hospital course:   Overall the patient has progressed slowly. Initially did have some coagulopathy but this was improved over the first few hours of surgery with correction of coagulopathy. She has had a thrombocytopenia but these values are stabilizing and improving. She has had postoperative atrial fibrillation. Early she is in normal sinus rhythm. All routine lines, monitors, and drainage devices have been discontinued in the standard fashion. She did have a postoperative episode of hypoxemia with shortness of breath. Evaluation including CT angiogram was positive for pulmonary embolism. This required return back to the surgical intensive care unit for a short-term period. She has been started on Coumadin. She is currently therapeutic. Her INR is being monitored daily. She has an acute blood loss anemia which has stabilized. Most recent hematocrit is 30% on today's date. She has a postoperative volume overload but is responding to diuretics. These will be continued at time of discharge. Currently she is felt to require further rehabilitation a skilled nursing facility. These arrangements are being made. Tentatively she is felt to be stable for discharge to the skilled facility in the next 1-2 days pending further evaluation of her clinical status.      Basename 02/03/11 0500 02/01/11 0600  NA 134* 133*  K 3.0* 3.6  CL 94* 94*  CO2 31 32  GLUCOSE 75 80  BUN 9 10  CALCIUM 8.7 8.6    Basename 02/02/11 0500 02/01/11 0600  WBC 7.2 6.9  HGB 9.9* 9.4*  HCT 30.1*  29.0*  PLT 209 198    Basename 02/03/11 0500 02/02/11 0500  INR 2.94* 3.56*     Discharge Instructions:  The patient is discharged to home with extensive instructions on wound care and progressive ambulation.  They are instructed not to drive or perform any heavy lifting until returning to see the physician in his office.  Discharge Diagnosis:  mitral regurgitation  Secondary Diagnosis: Patient Active Problem List  Diagnoses  . Hypertensive heart disease without CHF  . Diverticulosis  . Anemia, mild  . Lumbar disc disease  . Carotid artery disease  . Hypothyroidism  . Hyperlipidemia  . S/P mitral valve repair  . Pulmonary embolus   Past Medical History  Diagnosis Date  . Diverticulosis     chronic issues with consitpation and diarrhea  . Carotid artery disease   . Hypercholesterolemia   . Osteoarthritis     chronic-on multiple pain meds.  . Paroxysmal atrial fibrillation     not on coumadin-Cards=T[facility]ley-saw him ?1 yr ago  . CHF (congestive heart failure), NYHA class II     has sudden onset decom CHF? 2/2 to PNA  . Lumbar disc  disease   . Hypertensive heart disease without CHF   . Lumbar disc disease   . Heart murmur   . Pneumonia     few times  . Hypothyroidism   . Anemia   . Hypertension   . Mitral regurgitation   . Urinary tract infection     hx of UTI       Desiree [name], Desiree [name]  Home Medication Instructions [id number]   Printed on:02/03/11 1444  Medication Information                    atorvastatin (LIPITOR) 80 MG tablet Take 80 mg by mouth at bedtime.            levothyroxine (SYNTHROID, LEVOTHROID) 75 MCG tablet Take 75 mcg by mouth daily.            aspirin 81 MG tablet Take 81 mg by mouth daily.            amiodarone (PACERONE) 200 MG tablet Take 1 tablet (200 mg total) by mouth 2 (two) times daily.           polyethylene glycol (MIRALAX / GLYCOLAX) packet Take 17 g by mouth daily.             Polyethyl Glycol-Propyl Glycol (SYSTANE  OP) Apply 2 drops to eye 4 (four) times daily as needed. For dry eyes.           furosemide (LASIX) 40 MG tablet Take 1 tablet (40 mg total) by mouth daily.           metoprolol tartrate (LOPRESSOR) 25 MG tablet Take 1 tablet (25 mg total) by mouth 2 (two) times daily.           potassium chloride 20 MEQ/15ML (10%) solution Take 15 mLs (20 mEq total) by mouth 2 (two) times daily.           traMADol (ULTRAM) 50 MG tablet Take 1-2 tablets (50-100 mg total) by mouth every 6 (six) hours as needed. Maximum dose= 8 tablets per day             Disposition: Discharge to a skilled nursing facility. Coumadin dosing will be determined at the time of discharge as it has been held for a couple days due to relatively high INR is greater than 3.5.  Patient's condition is Good  Gershon Crane, PA-C 02/03/2011  2:44 PM

## 2011-02-04 ENCOUNTER — Inpatient Hospital Stay (HOSPITAL_COMMUNITY): Payer: 59

## 2011-02-04 LAB — PROTIME-INR
INR: 2.16 — ABNORMAL HIGH (ref 0.00–1.49)
Prothrombin Time: 24.5 seconds — ABNORMAL HIGH (ref 11.6–15.2)

## 2011-02-04 LAB — BASIC METABOLIC PANEL
Chloride: 98 mEq/L (ref 96–112)
Creatinine, Ser: 0.85 mg/dL (ref 0.50–1.10)
GFR calc Af Amer: 71 mL/min — ABNORMAL LOW (ref >90)
Potassium: 4.3 mEq/L (ref 3.5–5.1)
Sodium: 136 mEq/L (ref 135–145)

## 2011-02-04 MED ORDER — WARFARIN SODIUM 2 MG PO TABS
2.0000 mg | ORAL_TABLET | Freq: Every day | ORAL | Status: DC
  Administered 2011-02-04 – 2011-02-06 (×3): 2 mg via ORAL
  Filled 2011-02-04 (×4): qty 1

## 2011-02-04 MED ORDER — POTASSIUM CHLORIDE CRYS ER 20 MEQ PO TBCR
40.0000 meq | EXTENDED_RELEASE_TABLET | Freq: Every day | ORAL | Status: DC
  Administered 2011-02-04 – 2011-02-10 (×7): 40 meq via ORAL
  Filled 2011-02-04 (×2): qty 2
  Filled 2011-02-04: qty 1
  Filled 2011-02-04: qty 2
  Filled 2011-02-04: qty 1
  Filled 2011-02-04 (×4): qty 2

## 2011-02-04 MED ORDER — DEXTROSE 5 % IV SOLN
1.5000 g | INTRAVENOUS | Status: DC
  Administered 2011-02-05: 1.5 g via INTRAVENOUS
  Filled 2011-02-04: qty 1.5

## 2011-02-04 NOTE — Progress Notes (Signed)
Offered patient to do third walk and at this time she refused.  Will continue to ask later on.

## 2011-02-04 NOTE — Progress Notes (Signed)
CARDIAC REHAB PHASE I   PRE:  Rate/Rhythm: 88SR  BP:  Supine:   Sitting: 115/47  Standing:    SaO2: 93%RA  MODE:  Ambulation: 450 ft   POST:  Rate/Rhythem: 99SR  BP:  Supine:   Sitting: 115/80  Standing:    SaO2: 94-96%RA  Checked in hall with each sitting rest period 0912-0952 Pt walked 450 ft on RA with rolling walker and asst x 2. Followed with chair. Pt sat three times to rest--150 ft each time. Sats remained stable on RA. No c/o nausea. To recliner with call bell. PT to see in p.m.  Duanne Limerick

## 2011-02-04 NOTE — Progress Notes (Signed)
Pt refused to walk tonight. 

## 2011-02-04 NOTE — Progress Notes (Signed)
Pt did want to walk last night. Pt walked twice during day-shift.

## 2011-02-04 NOTE — Progress Notes (Signed)
Physical Therapy Treatment Patient Details Name: Desiree [name] MRN: [id number] DOB: 01-23-1925 Today's Date: 02/04/2011  PT Assessment/Plan  PT - Assessment/Plan Comments on Treatment Session: limited by endurance PT Plan: Discharge plan remains appropriate Follow Up Recommendations: Skilled nursing facility Equipment Recommended: Defer to next venue PT Goals  Acute Rehab PT Goals PT Goal: Sit to Stand - Progress: Progressing toward goal PT Goal: Stand to Sit - Progress: Progressing toward goal PT Transfer Goal: Bed to Chair/Chair to Bed - Progress: Progressing toward goal PT Goal: Ambulate - Progress: Progressing toward goal  PT Treatment Precautions/Restrictions  Precautions Precautions: Fall;Sternal Required Braces or Orthoses: No Restrictions Weight Bearing Restrictions: No Mobility (including Balance) Bed Mobility Bed Mobility: No Transfers Transfers: Yes Sit to Stand: 4: Min assist;From chair/3-in-1;Without upper extremity assist Sit to Stand Details (indicate cue type and reason): vc's to maintain her sternal precautions Stand to Sit: 4: Min assist;With upper extremity assist;To chair/3-in-1 Stand to Sit Details: vc's to maintain her sternal precautions Ambulation/Gait Ambulation/Gait: Yes Ambulation/Gait Assistance: Other (comment);4: Min assist (to min guard A when not fatigue) Ambulation/Gait Assistance Details (indicate cue type and reason): vc's to maintain an appropriate distance from the RW; vc's for postural checks Ambulation Distance (Feet): 340 Feet (170 x2 with a sitting rest break in between) Assistive device: Rolling walker Gait Pattern: Decreased step length - right;Decreased step length - left;Decreased stride length;Shuffle (but improving gait per her niece) Stairs: No  Posture/Postural Control Postural Limitations: flexed trunk Exercise    End of Session PT - End of Session Activity Tolerance: Patient limited by fatigue Patient left: in  chair General Behavior During Session: [facility] for tasks performed Cognition: [facility] for tasks performed  Jahel Wavra, Eliseo Gum 02/04/2011, 4:50 PM  02/04/2011  [place] Bing, PT [phone] (pager)

## 2011-02-04 NOTE — Progress Notes (Addendum)
14 Days Post-Op  Procedure(s) (LRB): MITRAL VALVE REPAIR (MVR) (N/A) Subjective: Nausea and vomiting with liquid K+ last pm.  Objective  Telemetry NSR  Temp:  [97.5 F (36.4 C)-98.2 F (36.8 C)] 98 F (36.7 C) (12/12 0525) Pulse Rate:  [80-87] 87  (12/12 0525) Resp:  [19-22] 20  (12/12 0525) BP: (94-114)/(49-72) 110/64 mmHg (12/12 0525) SpO2:  [91 %-92 %] 91 % (12/12 0525) Weight:  [137 lb 2 oz (62.2 kg)] 137 lb 2 oz (62.2 kg) (12/12 0525)   Intake/Output Summary (Last 24 hours) at 02/04/11 1610 Last data filed at 02/04/11 0636  Gross per 24 hour  Intake    120 ml  Output   1650 ml  Net  -1530 ml       General appearance: alert, cooperative and no distress Heart: regular rate and rhythm and S1, S2 normal Lungs: diminished in the bases Abdomen: soft, non-tender; bowel sounds normal; no masses,  no organomegaly Extremities: 2+ edema BLE Wound: incisions healing well, without signs of infection  Lab Results:  Basename 02/04/11 0525 02/03/11 0500  NA 136 134*  K 4.3 3.0*  CL 98 94*  CO2 30 31  GLUCOSE 89 75  BUN 10 9  CREATININE 0.85 0.77  CALCIUM 9.1 8.7  MG -- --  PHOS -- --   No results found for this basename: AST:2,ALT:2,ALKPHOS:2,BILITOT:2,PROT:2,ALBUMIN:2 in the last 72 hours No results found for this basename: LIPASE:2,AMYLASE:2 in the last 72 hours  Basename 02/02/11 0500  WBC 7.2  NEUTROABS --  HGB 9.9*  HCT 30.1*  MCV 93.2  PLT 209   No results found for this basename: CKTOTAL:4,CKMB:4,TROPONINI:4 in the last 72 hours No components found with this basename: POCBNP:3 No results found for this basename: DDIMER in the last 72 hours No results found for this basename: HGBA1C in the last 72 hours No results found for this basename: CHOL,HDL,LDLCALC,TRIG,CHOLHDL in the last 72 hours No results found for this basename: TSH,T4TOTAL,FREET3,T3FREE,THYROIDAB in the last 72 hours No results found for this basename:  VITAMINB12,FOLATE,FERRITIN,TIBC,IRON,RETICCTPCT in the last 72 hours  Medications: Scheduled    . docusate sodium  200 mg Oral Daily  . feeding supplement  237 mL Oral TID WC  . furosemide  40 mg Intravenous Once  . furosemide  40 mg Oral BID  . levothyroxine  75 mcg Oral Daily  . metoprolol tartrate  25 mg Oral BID  . pantoprazole  40 mg Oral QAC breakfast  . potassium chloride  40 mEq Oral TID  . povidone-iodine  1 application Topical BID  . sodium chloride  10 mL Intracatheter Q12H  . warfarin   Does not apply Once  . DISCONTD: furosemide  40 mg Oral Daily  . DISCONTD: potassium chloride  40 mEq Oral BID     Radiology/Studies:  Dg Chest 2 View  02/03/2011  *RADIOLOGY REPORT*  Clinical Data: Post CABG.  Follow up of heart surgery.  CHEST - 2 VIEW  Comparison: 01/30/2011  Findings: Prior median sternotomy.  Right-sided PICC line unchanged.  Minimal convex right thoracolumbar spine curvature. Mild cardiomegaly.  Right-sided pleural effusion is increased and small - moderate.  Small left pleural effusion is not significantly changed. No pneumothorax.  Asymmetric interstitial edema is moderate and greater right than left.  Lower lobe predominant airspace disease is similar on the left and increased on the right.  IMPRESSION:  1.  Worsened right-sided aeration with increased pleural fluid and airspace disease. 2.  No change in left-sided pleural effusion and  probable atelectasis. 3. Worsening asymmetric interstitial edema.  Original Report Authenticated By: Consuello Bossier, M.D.    INR:2.16 Will add last result for INR, ABG once components are confirmed Will add last 4 CBG results once components are confirmed  Assessment/Plan: S/P Procedure(s) (LRB): MITRAL VALVE REPAIR (MVR) (N/A) 1. CXR reviewed, stable appearance of effusions, Dr Cornelius Moras to review for consideration of tap vs pleurx, cont diuresis. Change to pill form of K+. 2. Will need to order coumadin after decision on any  procedures.   LOS: 14 days    GOLD,WAYNE E 12/12/20128:22 AM   I have seen and examined the patient and agree with the assessment and plan as outlined.  Discussed options with regards to recurrent right pleural effusion with Mrs. Boehm including continued observation vs repeat thoracentesis vs Pleur-X catheter.  Will tentatively plan placement of right Pleur-X catheter tomorrow.  Risks and benefits discussed with high likelihood of success and low risk for complication other than risk of bleeding given her need for ongoing anticoagulation.  All questions answered.  Estefanny Moler H 02/04/2011 7:55 PM

## 2011-02-04 NOTE — Progress Notes (Signed)
Discussed in the long length of stay meeting Desiree [name] 02/04/2011  

## 2011-02-05 ENCOUNTER — Encounter (HOSPITAL_COMMUNITY)
Admission: RE | Disposition: A | Discharge status: Skilled Nursing Facility | Payer: Self-pay | Source: Ambulatory Visit | Attending: Thoracic Surgery (Cardiothoracic Vascular Surgery)

## 2011-02-05 ENCOUNTER — Encounter (HOSPITAL_COMMUNITY): Payer: Self-pay | Admitting: Certified Registered"

## 2011-02-05 ENCOUNTER — Inpatient Hospital Stay (HOSPITAL_COMMUNITY): Payer: 59 | Admitting: Certified Registered"

## 2011-02-05 ENCOUNTER — Inpatient Hospital Stay (HOSPITAL_COMMUNITY): Payer: 59

## 2011-02-05 DIAGNOSIS — J90 Pleural effusion, not elsewhere classified: Secondary | ICD-10-CM

## 2011-02-05 HISTORY — PX: CHEST TUBE INSERTION: SHX231

## 2011-02-05 LAB — PROTIME-INR
INR: 1.73 — ABNORMAL HIGH (ref 0.00–1.49)
Prothrombin Time: 20.6 seconds — ABNORMAL HIGH (ref 11.6–15.2)

## 2011-02-05 SURGERY — INSERTION, PLEURAL DRAINAGE CATHETER
Anesthesia: General | Site: Chest | Laterality: Right | Wound class: Clean Contaminated

## 2011-02-05 MED ORDER — LIDOCAINE HCL (PF) 1 % IJ SOLN
INTRAMUSCULAR | Status: DC | PRN
  Administered 2011-02-05: 15 mL

## 2011-02-05 MED ORDER — FENTANYL CITRATE 0.05 MG/ML IJ SOLN
INTRAMUSCULAR | Status: DC | PRN
  Administered 2011-02-05 (×2): 25 ug via INTRAVENOUS

## 2011-02-05 MED ORDER — FENTANYL CITRATE 0.05 MG/ML IJ SOLN
25.0000 ug | INTRAMUSCULAR | Status: DC | PRN
  Administered 2011-02-05 (×2): 50 ug via INTRAVENOUS

## 2011-02-05 MED ORDER — PROMETHAZINE HCL 25 MG/ML IJ SOLN: 6.2500 mg | INTRAMUSCULAR | Status: DC | PRN

## 2011-02-05 MED ORDER — PROPOFOL 10 MG/ML IV EMUL
INTRAVENOUS | Status: DC | PRN
  Administered 2011-02-05: 25 ug/kg/min via INTRAVENOUS

## 2011-02-05 MED ORDER — PHENYLEPHRINE HCL 10 MG/ML IJ SOLN
INTRAMUSCULAR | Status: DC | PRN
  Administered 2011-02-05: 60 ug via INTRAVENOUS

## 2011-02-05 MED ORDER — MEPERIDINE HCL 25 MG/ML IJ SOLN: 6.2500 mg | INTRAMUSCULAR | Status: DC | PRN

## 2011-02-05 MED ORDER — LACTATED RINGERS IV SOLN
INTRAVENOUS | Status: DC | PRN
  Administered 2011-02-05: 07:00:00 via INTRAVENOUS

## 2011-02-05 MED ORDER — HEPARIN SOD (PORCINE) IN D5W 100 UNIT/ML IV SOLN
1000.0000 [IU]/h | INTRAVENOUS | Status: DC
  Administered 2011-02-05: 850 [IU]/h via INTRAVENOUS
  Administered 2011-02-05: 1000 [IU]/h via INTRAVENOUS
  Filled 2011-02-05 (×3): qty 250

## 2011-02-05 SURGICAL SUPPLY — 28 items
CANISTER SUCTION 2500CC (MISCELLANEOUS) ×2 IMPLANT
CLOTH BEACON ORANGE TIMEOUT ST (SAFETY) ×2 IMPLANT
COVER SURGICAL LIGHT HANDLE (MISCELLANEOUS) ×4 IMPLANT
DERMABOND ADVANCED (GAUZE/BANDAGES/DRESSINGS) ×1
DERMABOND ADVANCED .7 DNX12 (GAUZE/BANDAGES/DRESSINGS) ×1 IMPLANT
DRAPE C-ARM 42X72 X-RAY (DRAPES) ×2 IMPLANT
DRAPE LAPAROSCOPIC ABDOMINAL (DRAPES) ×2 IMPLANT
GLOVE BIO SURGEON STRL SZ 6 (GLOVE) ×4 IMPLANT
GLOVE BIOGEL M STRL SZ7.5 (GLOVE) ×2 IMPLANT
GOWN BRE IMP SLV AUR XL STRL (GOWN DISPOSABLE) ×2 IMPLANT
GOWN STRL NON-REIN LRG LVL3 (GOWN DISPOSABLE) ×4 IMPLANT
KIT BASIN OR (CUSTOM PROCEDURE TRAY) ×2 IMPLANT
KIT PLEURX DRAIN CATH 15.5FR (DRAIN) ×2 IMPLANT
KIT ROOM TURNOVER OR (KITS) ×2 IMPLANT
NS IRRIG 1000ML POUR BTL (IV SOLUTION) ×2 IMPLANT
PACK GENERAL/GYN (CUSTOM PROCEDURE TRAY) ×2 IMPLANT
PAD ARMBOARD 7.5X6 YLW CONV (MISCELLANEOUS) ×4 IMPLANT
SET DRAINAGE LINE (MISCELLANEOUS) IMPLANT
SPONGE GAUZE 4X4 12PLY (GAUZE/BANDAGES/DRESSINGS) ×2 IMPLANT
SUT ETHILON 3 0 PS 1 (SUTURE) ×2 IMPLANT
SUT MNCRL AB 3-0 PS2 18 (SUTURE) ×2 IMPLANT
SUT SILK 2 0 SH CR/8 (SUTURE) ×2 IMPLANT
SYR BULB IRRIGATION 50ML (SYRINGE) ×2 IMPLANT
TAPE CLOTH 4X10 WHT NS (GAUZE/BANDAGES/DRESSINGS) ×2 IMPLANT
TAPE CLOTH SURG 4X10 WHT LF (GAUZE/BANDAGES/DRESSINGS) ×2 IMPLANT
TOWEL OR 17X24 6PK STRL BLUE (TOWEL DISPOSABLE) ×2 IMPLANT
TOWEL OR 17X26 10 PK STRL BLUE (TOWEL DISPOSABLE) ×2 IMPLANT
WATER STERILE IRR 1000ML POUR (IV SOLUTION) ×2 IMPLANT

## 2011-02-05 NOTE — Anesthesia Postprocedure Evaluation (Signed)
  Anesthesia Post-op Note  Patient: Desiree [name]  Procedure(s) Performed:  INSERTION PLEURAL DRAINAGE CATHETER  Patient Location: PACU  Anesthesia Type: MAC  Level of Consciousness: awake  Airway and Oxygen Therapy: Patient Spontanous Breathing and Patient connected to nasal cannula oxygen  Post-op Pain: mild  Post-op Assessment: Post-op Vital signs reviewed, Patient's Cardiovascular Status Stable, Respiratory Function Stable and Patent Airway  Post-op Vital Signs: Reviewed and stable  Complications: No apparent anesthesia complications

## 2011-02-05 NOTE — Brief Op Note (Signed)
01/21/2011 - 02/05/2011  8:39 AM  PATIENT:  Desiree [name]  77 y.o. female  PRE-OPERATIVE DIAGNOSIS:  Right pleural effusion  POST-OPERATIVE DIAGNOSIS:  Right pleural effusion  PROCEDURE:  Procedure(s): INSERTION PLEURAL DRAINAGE CATHETER (RIGHT)  SURGEON:    Purcell Nails, MD  ASSISTANTS:  n/a  ANESTHESIA:   1% lidocaine local with MAC intravenous sedation  FINDINGS: Approximately 1100 mL thin serosanguinous fluid evacuated uneventfully  COMPLICATIONS: none  PATIENT DISPOSITION:   TO SICU IN STABLE CONDITION  OWEN,CLARENCE H 02/05/2011 8:40 AM

## 2011-02-05 NOTE — Progress Notes (Signed)
ANTICOAGULATION CONSULT NOTE - Initial Consult  Pharmacy Consult for Heparin Indication: Heparin bridge for SUBtherapeutic INR in setting of (+) PE on 01/26/11  No Known Allergies  Patient Measurements: Height: 4\' 11"  (149.9 cm) Weight: 133 lb 11.2 oz (60.646 kg) IBW/kg (Calculated) : 43.2  Heparin Dosing Weight: 58 kg  Vital Signs: Temp: 98.7 F (37.1 C) (12/13 1054) Temp src: Oral (12/13 1054) BP: 114/70 mmHg (12/13 1054) Pulse Rate: 92  (12/13 1054)  Labs:  Basename 02/05/11 0515 02/04/11 0525 02/03/11 0500  HGB -- -- --  HCT -- -- --  PLT -- -- --  APTT -- -- --  LABPROT 20.6* 24.5* 31.1*  INR 1.73* 2.16* 2.94*  HEPARINUNFRC -- -- --  CREATININE -- 0.85 0.77  CKTOTAL -- -- --  CKMB -- -- --  TROPONINI -- -- --   Estimated Creatinine Clearance: 39 ml/min (by C-G formula based on Cr of 0.85).  Medical History: Past Medical History  Diagnosis Date  . Diverticulosis     chronic issues with consitpation and diarrhea  . Carotid artery disease   . Hypercholesterolemia   . Osteoarthritis     chronic-on multiple pain meds.  . Paroxysmal atrial fibrillation     not on coumadin-Cards=Tilley-saw him ?1 yr ago  . CHF (congestive heart failure), NYHA class II     has sudden onset decom CHF? 2/2 to PNA  . Lumbar disc disease   . Hypertensive heart disease without CHF   . Lumbar disc disease   . Heart murmur   . Pneumonia     few times  . Hypothyroidism   . Anemia   . Hypertension   . Mitral regurgitation   . Urinary tract infection     hx of UTI    Medications:  Prescriptions prior to admission  Medication Sig Dispense Refill  . amiodarone (PACERONE) 200 MG tablet Take 1 tablet (200 mg total) by mouth 2 (two) times daily.  60 tablet  0  . aspirin 81 MG tablet Take 81 mg by mouth daily.       Marland Kitchen atorvastatin (LIPITOR) 80 MG tablet Take 80 mg by mouth at bedtime.       Marland Kitchen HYDROcodone-acetaminophen (NORCO) 5-325 MG per tablet Take 1 tablet by mouth every 6 (six)  hours as needed. For pain.       Marland Kitchen levothyroxine (SYNTHROID, LEVOTHROID) 75 MCG tablet Take 75 mcg by mouth daily.       Marland Kitchen DISCONTD: furosemide (LASIX) 40 MG tablet Take 1 tablet (40 mg total) by mouth daily.  30 tablet  1  . DISCONTD: metoprolol (LOPRESSOR) 50 MG tablet Take 1 tablet (50 mg total) by mouth 2 (two) times daily.  30 tablet  1  . DISCONTD: spironolactone (ALDACTONE) 25 MG tablet Take 1 tablet (25 mg total) by mouth daily.  30 tablet  1  . Polyethyl Glycol-Propyl Glycol (SYSTANE OP) Apply 2 drops to eye 4 (four) times daily as needed. For dry eyes.      . polyethylene glycol (MIRALAX / GLYCOLAX) packet Take 17 g by mouth daily.         Scheduled:    . docusate sodium  200 mg Oral Daily  . feeding supplement  237 mL Oral TID WC  . furosemide  40 mg Oral BID  . levothyroxine  75 mcg Oral Daily  . metoprolol tartrate  25 mg Oral BID  . pantoprazole  40 mg Oral QAC breakfast  . potassium chloride  40  mEq Oral Daily  . povidone-iodine  1 application Topical BID  . sodium chloride  10 mL Intracatheter Q12H  . warfarin  2 mg Oral q1800  . warfarin   Does not apply Once  . DISCONTD: cefUROXime (ZINACEF)  IV  1.5 g Intravenous 60 min Pre-Op    Assessment: 77 y.o. F started on anticoagulation s/p (+) PE noted on CT angio on 01/26/11. Originally bridged with heparin to therapeutic INR with warfarin. INR has now fallen <2 as warfarin was held on 12/10 and 12/11 for Pleur-X catheter placement. Heparin to be restarted to bridge to INR >2 and then d/ced. Will not bolus given recent pleural catheter placement and elevated INR. Will start at rate previously known to be therapeutic.   Goal of Therapy:  Heparin level 0.3-0.7 units/ml   Plan:  1) Initiate heparin drip at rate of 850 units/hr (8.5 ml/hr) 2) Will continue to monitor for any signs/symptoms of bleeding and will follow up with heparin level in 8 hours  3) Daily heparin levels  Rolley Sims 02/05/2011,11:37 AM

## 2011-02-05 NOTE — Op Note (Signed)
CARDIOTHORACIC SURGERY OPERATIVE NOTE  Date of Procedure: 02/05/2011  Preoperative Diagnosis: Recurrent Right Pleural Effusion Postoperative Diagnosis: Same  Procedure: Placement of Right Pleur-X Catheter  Surgeon: Salvatore Decent. Cornelius Moras, MD Anesthesia: 1% lidocaine local with intravenous sedation (MAC)    INDICATIONS FOR SURGERY   The patient is an 77yo female who underwent mitral valve repair for severe mitral regurgitation.  Postoperatively the patient has developed recurrent symptomatic right pleural effusion despite previous thoracentesis and continued diuretic therapy.     DETAILS OF THE OPERATIVE PROCEDURE  Following full informed consent the patient was brought to the operating room where intravenous sedation was administered and the patient continuously monitored by the anesthesia staff. The right chest was prepared and draped in a sterile manner. A timeout procedure was performed.  1% lidocaine was utilized to anesthetize the skin and subcutaneous tissues. A small stab incision was made and an angiocath placed into the right pleural space near the posterior axillary line. A guidewire was advanced through the angiocath and positioned in the posterior pleural space using fluoroscopic guidance.  A Pleur-X catheter was tunneled from a second stab incision located more anteriorly to the posterior stab incision.  Dilating catheters and an introducing sheath were passed over the guidewire.  The guidewire and dilating catheter were removed and the Pleur-X catheter advanced through the introducing sheath into the pleural space.  The sheath was removed and the final position of the catheter verified using fluoroscopy.  The catheter was secured to the skin and connected to a closed suction vacuum bottle for drainage. A total of 1100 mL of thin serosanguinous fluid was evacuated.  The patient tolerated the procedure well and was transported to the PACU in stable condition.  There were no complications.      Salvatore Decent. Cornelius Moras, MD

## 2011-02-05 NOTE — Progress Notes (Signed)
CARDIAC REHAB PHASE I   PRE:  Rate/Rhythm: 90 SR  BP:  Supine: 130/53  Sitting:   Standing:    SaO2: 91 2L  MODE:  Ambulation: 300 ft   POST:  Rate/Rhythem: 100 SR  BP:  Supine:   Sitting: 109/49  Standing:    SaO2: 92 2L 1415-1450 Assisted X 2 and used walker and O2 2L to ambulate. Pt very reluctant to try to walk. Complaining of pain around drainage catheter. Discussed with RN, not time for pain medication. She tolerated walk well moans and leans forward with walking. To recliner after walk with call light in reach. She took two sitting rest stops.  Beatrix Fetters

## 2011-02-05 NOTE — Progress Notes (Signed)
  Echocardiogram 2D Echocardiogram has been performed.  Desiree [name] 02/05/2011, 5:07 PM

## 2011-02-05 NOTE — Progress Notes (Signed)
   CARDIOTHORACIC SURGERY PROGRESS NOTE  Day of Surgery  S/P Procedure(s) (LRB): INSERTION PLEURAL DRAINAGE CATHETER (Right)  Subjective: No new complaints except for pain associated with PleurX catheter  Objective: Vital signs in last 24 hours: Temp:  [97.6 F (36.4 C)-99 F (37.2 C)] 98.7 F (37.1 C) (12/13 1054) Pulse Rate:  [84-92] 92  (12/13 1054) Cardiac Rhythm:  [-] Heart block (12/13 1026) Resp:  [11-22] 20  (12/13 1054) BP: (101-137)/(50-70) 114/70 mmHg (12/13 1054) SpO2:  [90 %-100 %] 90 % (12/13 1054) Weight:  [60.646 kg (133 lb 11.2 oz)] 133 lb 11.2 oz (60.646 kg) (12/13 0543)  Physical Exam:  Rhythm:   sinus  Breath sounds: Diminished at bases, L>R  Heart sounds:  RRR, no murmur  Incisions:  Clean and dry  Abdomen:  soft  Extremities:  warm   Intake/Output from previous day: 12/12 0701 - 12/13 0700 In: 720 [P.O.:720] Out: 2540 [Urine:2540] Intake/Output this shift: Total I/O In: 400 [I.V.:400] Out: 0   Lab Results: No results found for this basename: WBC:2,HGB:2,HCT:2,PLT:2 in the last 72 hours BMET:  [facility] 02/04/11 0525 02/03/11 0500  NA 136 134*  K 4.3 3.0*  CL 98 94*  CO2 30 31  GLUCOSE 89 75  BUN 10 9  CREATININE 0.85 0.77  CALCIUM 9.1 8.7    CBG (last 3)  No results found for this basename: GLUCAP:3 in the last 72 hours PT/INR:   Basename 02/05/11 0515  LABPROT 20.6*  INR 1.73*    CXR:  Much improved aeration on right side  Assessment/Plan: S/P Procedure(s) (LRB): INSERTION PLEURAL DRAINAGE CATHETER (Right)  Mrs. Cyran is slowly improving Will need to restart heparin since INR sub therapeutic Continue coumadin 2 mg daily and d/c heparin once INR > 2.0 Mobilize Continue diuresis Possible d/c to SNF in 2-3 days if she continues to improve  Nico Syme H 02/05/2011 11:24 AM

## 2011-02-05 NOTE — Anesthesia Preprocedure Evaluation (Addendum)
Anesthesia Evaluation  Patient identified by MRN, date of birth, ID band Patient awake    Reviewed: Allergy & Precautions, H&P , NPO status , Patient's Chart, lab work & pertinent test results  Airway Mallampati: II TM Distance: >3 FB Neck ROM: Full    Dental  (+) Teeth Intact   Pulmonary  clear to auscultation        Cardiovascular hypertension, Pt. on medications +CHF + dysrhythmias Atrial Fibrillation IIRegular Normal    Neuro/Psych Negative Neurological ROS  Negative Psych ROS   GI/Hepatic negative GI ROS, Neg liver ROS,   Endo/Other  Hypothyroidism   Renal/GU negative Renal ROS     Musculoskeletal negative musculoskeletal ROS (+)   Abdominal   Peds  Hematology negative hematology ROS (+)   Anesthesia Other Findings   Reproductive/Obstetrics                           Anesthesia Physical Anesthesia Plan  ASA: III  Anesthesia Plan: MAC   Post-op Pain Management:    Induction: Intravenous  Airway Management Planned: Mask  Additional Equipment:   Intra-op Plan:   Post-operative Plan:   Informed Consent: I have reviewed the patients History and Physical, chart, labs and discussed the procedure including the risks, benefits and alternatives for the proposed anesthesia with the patient or authorized representative who has indicated his/her understanding and acceptance.     Plan Discussed with: CRNA  Anesthesia Plan Comments:        Anesthesia Quick Evaluation

## 2011-02-05 NOTE — Transfer of Care (Signed)
Immediate Anesthesia Transfer of Care Note  Patient: Desiree [name]  Procedure(s) Performed:  INSERTION PLEURAL DRAINAGE CATHETER  Patient Location: PACU  Anesthesia Type: MAC  Level of Consciousness: awake, alert  and oriented  Airway & Oxygen Therapy: Patient Spontanous Breathing and Patient connected to face mask oxygen  Post-op Assessment: Report given to PACU RN, Post -op Vital signs reviewed and stable and Patient moving all extremities X 4  Post vital signs: Reviewed and stable  Complications: No apparent anesthesia complications

## 2011-02-05 NOTE — Progress Notes (Signed)
ANTICOAGULATION CONSULT NOTE - Follow Up Consult  Pharmacy Consult for Heparin  Indication: Heparin bridge for SUBtherapeutic INR in setting of (+) PE on 01/26/11  No Known Allergies  Patient Measurements: Height: 4\' 11"  (149.9 cm) Weight: 133 lb 11.2 oz (60.646 kg) IBW/kg (Calculated) : 43.2  Adjusted Body Weight: 58kg  Vital Signs: Temp: 98.8 F (37.1 C) (12/13 2102) Temp src: Oral (12/13 2102) BP: 110/53 mmHg (12/13 2146) Pulse Rate: 89  (12/13 2146)  Labs:  Basename 02/05/11 2010 02/05/11 0515 02/04/11 0525 02/03/11 0500  HGB -- -- -- --  HCT -- -- -- --  PLT -- -- -- --  APTT -- -- -- --  LABPROT -- 20.6* 24.5* 31.1*  INR -- 1.73* 2.16* 2.94*  HEPARINUNFRC <0.10* -- -- --  CREATININE -- -- 0.85 0.77  CKTOTAL -- -- -- --  CKMB -- -- -- --  TROPONINI -- -- -- --   Estimated Creatinine Clearance: 39 ml/min (by C-G formula based on Cr of 0.85).   Assessment: 77 y.o. F with PE, restarted heparin at previous therapeutic rate (850 units/hr) this AM for subtherapeutic INR. No bolus was given d/t recent pleural catheter placement. Heparin level bellow goal.   Goal of Therapy:  Heparin level 0.3-0.7 units/ml   Plan:  Increase heparin rate to 1000 units/hr F/u AM heparin level and INR Will d/c heparin when INR > 2  Bayard Hugger Wang 02/05/2011,10:10 PM

## 2011-02-06 ENCOUNTER — Inpatient Hospital Stay (HOSPITAL_COMMUNITY): Payer: 59

## 2011-02-06 ENCOUNTER — Other Ambulatory Visit: Payer: Self-pay

## 2011-02-06 ENCOUNTER — Encounter (HOSPITAL_COMMUNITY): Payer: Self-pay | Admitting: Thoracic Surgery (Cardiothoracic Vascular Surgery)

## 2011-02-06 LAB — BASIC METABOLIC PANEL
BUN: 12 mg/dL (ref 6–23)
CO2: 31 mEq/L (ref 19–32)
Chloride: 95 mEq/L — ABNORMAL LOW (ref 96–112)
Creatinine, Ser: 1 mg/dL (ref 0.50–1.10)
GFR calc Af Amer: 58 mL/min — ABNORMAL LOW (ref >90)
Glucose, Bld: 108 mg/dL — ABNORMAL HIGH (ref 70–99)
Potassium: 3.8 mEq/L (ref 3.5–5.1)

## 2011-02-06 LAB — CBC
HCT: 30 % — ABNORMAL LOW (ref 36.0–46.0)
Hemoglobin: 9.6 g/dL — ABNORMAL LOW (ref 12.0–15.0)
MCV: 92.9 fL (ref 78.0–100.0)
RBC: 3.23 MIL/uL — ABNORMAL LOW (ref 3.87–5.11)
RDW: 14.5 % (ref 11.5–15.5)
WBC: 6.8 10*3/uL (ref 4.0–10.5)

## 2011-02-06 LAB — PROTIME-INR: INR: 2.04 — ABNORMAL HIGH (ref 0.00–1.49)

## 2011-02-06 NOTE — Progress Notes (Signed)
   CARE MANAGEMENT NOTE 02/06/2011  Patient:  Desiree [name], Desiree [name]   Account Number:  [id number]  Date Initiated:  01/22/2011  Documentation initiated by:  [facility]  Subjective/Objective Assessment:   Post op MV repair.     Action/Plan:   PTA, PT INDEPENDENT, LIVES ALONE.  HAS SUPPORTIVE DAUGHTER.   Anticipated DC Date:  02/06/2011   Anticipated DC Plan:  SKILLED NURSING FACILITY  In-house referral  Clinical Social Worker      DC Planning Services  CM consult      Choice offered to / List presented to:             Status of service:  In process, will continue to follow Medicare Important Message given?   (If response is "NO", the following Medicare IM given date fields will be blank) Date Medicare IM given:   Date Additional Medicare IM given:    Discharge Disposition:    Per UR Regulation:  Reviewed for med. necessity/level of care/duration of stay  Comments:  02/06/11 Erion Hermans,RN,BSN 1400 PT WITH SLOW PROGRESSION; PLEURX CATHETER PLACED YESTERDAY FOR RECURRENT PLEURAL EFFUSION.  PLAN DC TO SNF ON MONDAY IF STABLE OVER THE WEEKEND, PER MD. Phone #[phone]  02-03-11 9:30am Avie Arenas, RNBSN - [phone] UR Completed.back on acute, tele unit.  01-28-11 12:30pm Avie Arenas, RNBSN [phone] UR Completed.  post op thoracentesis today.  01/26/11 Marijane Trower,RN,BSN 1200 PT TRANSFERRED FROM 2000 BACK TO 2300 TODAY WITH ACUTE PULMONARY EMBOLUS.  WILL FOLLOW PROGRESS.  01/22/11 Oshen Wlodarczyk,RN,BSN 1345 MET WITH PT AND DAUGHTER TO DISCUSS DC PLANS.  PT WILL NEED SHORT TERM SNF AT DISCHARGE FOR REHAB PRIOR TO DC HOME. WILL OBTAIN PT/OT CONSULTS WHEN ABLE TO TOLERATE.  REFERRAL TO CSW TO FACILITATE DC TO SNF WHEN MEDICALLY STABLE FOR DISCHARGE.  LIKELY WILL PREFER [facility] AS FIRST CHOICE, PER DAUGHTER.   01-22-11 59:15am Avie Arenas, RNBSN [phone] UR Completed.

## 2011-02-06 NOTE — Progress Notes (Addendum)
   CARDIOTHORACIC SURGERY PROGRESS NOTE  16 Days Post-Op Procedure(s) (LRB):  MITRAL VALVE REPAIR (MVR) (N/A)  1 Day Post-Op  S/P Procedure(s) (LRB): INSERTION PLEURAL DRAINAGE CATHETER (Right)  Subjective: Feels okay. Still some soreness around PleurX but improved.  Objective: Vital signs in last 24 hours: Temp:  [97.6 F (36.4 C)-98.8 F (37.1 C)] 98.7 F (37.1 C) (12/14 0447) Pulse Rate:  [88-92] 88  (12/14 0447) Cardiac Rhythm:  [-] Normal sinus rhythm;Heart block (12/13 2100) Resp:  [11-24] 22  (12/14 0447) BP: (107-137)/(50-70) 113/64 mmHg (12/14 0447) SpO2:  [90 %-100 %] 94 % (12/14 0447) Weight:  [60.4 kg (133 lb 2.5 oz)] 133 lb 2.5 oz (60.4 kg) (12/14 0447)  Physical Exam:  Rhythm:   sinus  Breath sounds: Few basilar crackles, diminished at bases  Heart sounds:  RRR, no murmur  Incisions:  Clean and dry  Abdomen:  soft  Extremities:  Warm, edema decreased   Intake/Output from previous day: 12/13 0701 - 12/14 0700 In: 3826.8 [I.V.:3826.8] Out: 800 [Urine:800] Intake/Output this shift:    Lab Results:  Basename 02/06/11 0533  WBC 6.8  HGB 9.6*  HCT 30.0*  PLT 154   BMET:  Basename 02/06/11 0533 02/04/11 0525  NA 134* 136  K 3.8 4.3  CL 95* 98  CO2 31 30  GLUCOSE 108* 89  BUN 12 10  CREATININE 1.00 0.85  CALCIUM 8.8 9.1    CBG (last 3)  No results found for this basename: GLUCAP:3 in the last 72 hours PT/INR:   Basename 02/06/11 0533  LABPROT 23.4*  INR 2.04*    CXR:  N/A  Assessment/Plan:  Slowly improving INR > 2.0 will d/c heparin Mobilize Drain PleurX  Possibly ready for d/c to SNF by Monday  OWEN,CLARENCE H 02/06/2011 8:18 AM   CXR done late this morning reveals increased left effusion.  Will perform thoracentesis.  Discussed with Mrs. Bree who agrees.  OWEN,CLARENCE H 02/06/2011 1:44 PM

## 2011-02-06 NOTE — Progress Notes (Signed)
Per Dr Cornelius Moras, pt likely ready to d/c Monday. CSW will contact SNF to check on bed availability and will continue to follow to facilitate transfer to SNF.   Baxter Flattery, MSW [phone]

## 2011-02-06 NOTE — Progress Notes (Signed)
PT Cancellation Note   Treatment cancelled today due to cardiac rehab just finished their treatment and now for thoracentesis---x  02/06/2011  [facility], PT [phone] (pager)

## 2011-02-06 NOTE — Progress Notes (Signed)
CARDIAC REHAB PHASE I   PRE:  Rate/Rhythm: 84SR  BP:  Supine:   Sitting: 110/47  Standing:    SaO2: 97%RA  MODE:  Ambulation: 500 ft   POST:  Rate/Rhythem: 96SR  BP:  Supine:   Sitting: 123/50  Standing:    SaO2: 96%RA 1325-1358 Pt walked 500 ft on RA with rolling walker and asst x 2. Pt tolerated well and only had to sit down once to rest. To chair after walk. O2 sats good on RA. In good spirits.  Duanne Limerick

## 2011-02-06 NOTE — Progress Notes (Signed)
Drained 350 cc's of serosanguinous fluid from the right pleurex catheter.  Baird Lyons

## 2011-02-06 NOTE — Op Note (Signed)
CARDIOTHORACIC SURGERY OPERATIVE NOTE   PRE-OPERATIVE DIAGNOSIS:  left pleural effusion  POST-OPERATIVE DIAGNOSIS:  same  PROCEDURE:  left needle thoracentesis  SURGEON:  Purcell Nails, MD  ANESTHESIA:  1% lidocaine local  PROCEDURE NOTE:  Following full informed consent the patient was placed in the upright sitting position and continuously monitored with telemetry, pulse oximetry, and intermittent blood pressure recordings.  The patient's left back was prepared and draped in a sterile manner.  1% lidocaine was utilized to anesthetize the skin, subcutaneous tissues and intercostal space in the mid scapular line overlying the 9th rib.  Needle thoracentesis was performed through a small stab incision using a thoracentesis kit.  A total of 500 mL of thin serosanguinous fluid was evacuated using a vacuum bottle trap.  A portion of the fluid was trapped separately and sent for routine culture.  The patient tolerated the procedure well and there were no complications.  A follow-up CXR was ordered.  Tarrin Menn H 02/06/2011 2:27 PM

## 2011-02-07 ENCOUNTER — Inpatient Hospital Stay (HOSPITAL_COMMUNITY): Payer: 59

## 2011-02-07 LAB — BASIC METABOLIC PANEL
BUN: 12 mg/dL (ref 6–23)
Calcium: 8.6 mg/dL (ref 8.4–10.5)
GFR calc Af Amer: 63 mL/min — ABNORMAL LOW (ref >90)
GFR calc non Af Amer: 54 mL/min — ABNORMAL LOW (ref >90)
Glucose, Bld: 84 mg/dL (ref 70–99)
Potassium: 3.1 mEq/L — ABNORMAL LOW (ref 3.5–5.1)
Sodium: 134 mEq/L — ABNORMAL LOW (ref 135–145)

## 2011-02-07 LAB — CBC
Hemoglobin: 9.2 g/dL — ABNORMAL LOW (ref 12.0–15.0)
MCH: 29.7 pg (ref 26.0–34.0)
MCHC: 31.7 g/dL (ref 30.0–36.0)
RDW: 14.7 % (ref 11.5–15.5)

## 2011-02-07 LAB — PROTIME-INR
INR: 2.7 — ABNORMAL HIGH (ref 0.00–1.49)
Prothrombin Time: 29.1 seconds — ABNORMAL HIGH (ref 11.6–15.2)

## 2011-02-07 MED ORDER — POTASSIUM CHLORIDE CRYS ER 20 MEQ PO TBCR
40.0000 meq | EXTENDED_RELEASE_TABLET | Freq: Once | ORAL | Status: AC
  Administered 2011-02-07: 40 meq via ORAL

## 2011-02-07 MED ORDER — POTASSIUM CHLORIDE CRYS ER 20 MEQ PO TBCR: 40.0000 meq | EXTENDED_RELEASE_TABLET | Freq: Once | ORAL | Status: DC

## 2011-02-07 MED ORDER — WARFARIN SODIUM 1 MG PO TABS: 1.0000 mg | ORAL_TABLET | Freq: Every day | ORAL | Status: DC

## 2011-02-07 MED ORDER — WARFARIN SODIUM 2 MG PO TABS
2.0000 mg | ORAL_TABLET | Freq: Every day | ORAL | Status: DC
  Administered 2011-02-08 – 2011-02-09 (×2): 2 mg via ORAL
  Filled 2011-02-07 (×5): qty 1

## 2011-02-07 NOTE — Progress Notes (Signed)
Assessed patient's ambulation status. Patient did not want to walk tonight. Will continue to monitor. 

## 2011-02-07 NOTE — Progress Notes (Addendum)
2 Days Post-Op Procedure(s) (LRB): INSERTION PLEURAL DRAINAGE CATHETER (Right)  Subjective: Patient just tired. Hopes to go to SNF on Monday.  Objective: Vital signs in last 24 hours: Patient Vitals for the past 24 hrs:  BP Temp Temp src Pulse Resp SpO2  02/07/11 0500 106/56 mmHg 98.1 F (36.7 C) Oral 88  18  94 %  02/06/11 2146 122/55 mmHg - - 97  - -  02/06/11 2145 122/97 mmHg - - 97  - -  02/06/11 2023 101/64 mmHg 97.9 F (36.6 C) Oral 93  20  90 %  02/06/11 1324 111/63 mmHg 97 F (36.1 C) Oral 84  20  94 %   Pre op weight  56.2 kg Current Weight  02/06/11 133 lb 2.5 oz (60.4 kg)     Intake/Output from previous day: 12/14 0701 - 12/15 0700 In: 240 [P.O.:240] Out: 1801 [Urine:1800; Stool:1]   Physical Exam:  Cardiovascular: RRR, no murmurs, gallops, or rubs. Pulmonary: Slightly decreased at bases; no rales, wheezes, or rhonchi. Abdomen: Soft, non tender, bowel sounds present. Extremities: Trace bilateral lower extremity edema. Wounds: Clean and dry.  No erythema or signs of infection.  Lab Results: CBC: Basename 02/07/11 0630 02/06/11 0533  WBC 5.2 6.8  HGB 9.2* 9.6*  HCT 29.0* 30.0*  PLT 160 154   BMET:  Basename 02/07/11 0630 02/06/11 0533  NA 134* 134*  K 3.1* 3.8  CL 96 95*  CO2 31 31  GLUCOSE 84 108*  BUN 12 12  CREATININE 0.94 1.00  CALCIUM 8.6 8.8    PT/INR:  Basename 02/07/11 0630  LABPROT 29.1*  INR 2.70*   ABG:  INR: Will add last result for INR, ABG once components are confirmed Will add last 4 CBG results once components are confirmed  Assessment/Plan:  1. CV - SR. Continue with Lopressor 25 bid. Will hold Coumadin tonight as INR increased from 2.04 to 2.7 (need to remove EPW). 2.  Pulmonary - s/p left thoracentesis 12/14 (approximately 500 cc removed). Right pleurex drained  350 ccs.CXR today shows small bilateral pleural effusions (improved) and no pneumothroax. 3.  Acute blood loss anemia - H/H stable at 9.2/29. 4.Supplement  Potassium 5.Hopefully, to SNF on Monday.   Ardelle Balls, PA 02/07/2011    Chart reviewed, patient examined, agree with above. CXR looks better after left thoracentesis.  Minimal bilat effusion

## 2011-02-07 NOTE — Progress Notes (Signed)
Patient ambulated 200 ft with rolling walker and tolerated very well. Heart rate in high 90's and BP within normal range.  Pt felt good about walk. c Kwaku Mostafa, rn

## 2011-02-07 NOTE — Progress Notes (Signed)
CARDIAC REHAB PHASE I   PRE:  Rate/Rhythm: 78 SR    BP: sitting 100/48    SaO2: 97  RA  MODE:  Ambulation: 500 ft   POST:  Rate/Rhythm: 88 SR    BP: sitting 109/55     SaO2: 97 RA  Feels weaker today. Sat x2 for 500 ft, assist x1 with RW. Return to recliner.  [phone]  Harriet Masson CES, ACSM

## 2011-02-08 ENCOUNTER — Other Ambulatory Visit: Payer: Self-pay

## 2011-02-08 LAB — PROTIME-INR: INR: 2.19 — ABNORMAL HIGH (ref 0.00–1.49)

## 2011-02-08 MED ORDER — WARFARIN SODIUM 2 MG PO TABS: 2.0000 mg | ORAL_TABLET | Freq: Every day | ORAL | Status: DC

## 2011-02-08 MED ORDER — FUROSEMIDE 40 MG PO TABS: 40.0000 mg | ORAL_TABLET | Freq: Two times a day (BID) | ORAL | Status: DC

## 2011-02-08 MED ORDER — HYDROCODONE-ACETAMINOPHEN 5-325 MG PO TABS: 1.0000 | ORAL_TABLET | Freq: Four times a day (QID) | ORAL | Status: DC | PRN

## 2011-02-08 MED ORDER — METOPROLOL TARTRATE 25 MG PO TABS: 25.0000 mg | ORAL_TABLET | Freq: Two times a day (BID) | ORAL | Status: DC

## 2011-02-08 MED ORDER — POTASSIUM CHLORIDE CRYS ER 20 MEQ PO TBCR: 40.0000 meq | EXTENDED_RELEASE_TABLET | Freq: Every day | ORAL | Status: DC

## 2011-02-08 MED ORDER — PANTOPRAZOLE SODIUM 40 MG PO TBEC: 40.0000 mg | DELAYED_RELEASE_TABLET | Freq: Every day | ORAL | Status: DC

## 2011-02-08 MED ORDER — TRAMADOL HCL 50 MG PO TABS: 50.0000 mg | ORAL_TABLET | Freq: Four times a day (QID) | ORAL | Status: AC | PRN

## 2011-02-08 NOTE — Discharge Summary (Addendum)
Addendum Note to D/C  Desiree [name] is a 77 y.o. female who is most recently S/P  INSERTION RIGHT PLEURAL DRAINAGE CATHETER. She developed nausea and emesis after being given liquid Potassium supplement. In addition, she had a persistent bilateral pleural effusions (R>L). She underwent drainage of right pleural effusion and placement of right pleurex catheter by Dr. Cornelius Moras on 02/05/2011.  Her right pleurex ctheter is to be drained every other day (Tuesday, Thursday, and Saturday).She then underwent a left thoracentesis by Dr. Cornelius Moras on 02/06/2011.Approximately 500 cc was removed.She continued to progress with cardiac rehab. The patient is stable and ready for discharge to SNF, pending morning round evaluation.  Please see the attached sheets for the most current discharge medications.   Ardelle Balls PA-C 02/08/2011 10:25 AM

## 2011-02-08 NOTE — Progress Notes (Signed)
DC'd patient's epw's according to hospital protocol. Wires intact and pt tolerated well. Instructed pt to stay in bed for 1 hour or until 1130am. Vitals signs taken according to hospital policy. c Zianna Dercole, rn

## 2011-02-08 NOTE — Progress Notes (Addendum)
3 Days Post-Op Procedure(s) (LRB): INSERTION PLEURAL DRAINAGE CATHETER (Right)  Subjective:   Objective: Vital signs in last 24 hours: Patient Vitals for the past 24 hrs:  BP Temp Temp src Pulse Resp SpO2 Weight  02/08/11 0548 105/53 mmHg 98.2 F (36.8 C) Oral 88  18  97 % -  02/08/11 0243 - - - - - - 127 lb (57.607 kg)  02/07/11 2129 110/52 mmHg - - 87  - - -  02/07/11 2102 125/57 mmHg 98.3 F (36.8 C) Oral 92  19  93 % -  02/07/11 1300 98/43 mmHg 97.5 F (36.4 C) Oral 84  18  92 % -   Pre op weight  56.2 kg Current Weight  02/08/11 127 lb (57.607 kg)     Intake/Output from previous day: 12/15 0701 - 12/16 0700 In: 360 [P.O.:360] Out: 601 [Urine:600; Stool:1]   Physical Exam:  Cardiovascular: RRR, no murmurs, gallops, or rubs. Pulmonary: Slightly decreased at bases R>L; some crackles. Abdomen: Soft, non tender, bowel sounds present. Extremities: Trace bilateral lower extremity edema. Wounds: Clean and dry.  No erythema or signs of infection.  Lab Results: CBC:  Basename 02/07/11 0630 02/06/11 0533  WBC 5.2 6.8  HGB 9.2* 9.6*  HCT 29.0* 30.0*  PLT 160 154   BMET:   Basename 02/07/11 0630 02/06/11 0533  NA 134* 134*  K 3.1* 3.8  CL 96 95*  CO2 31 31  GLUCOSE 84 108*  BUN 12 12  CREATININE 0.94 1.00  CALCIUM 8.6 8.8    PT/INR:   Basename 02/08/11 0630  LABPROT 24.7*  INR 2.19*   ABG:  INR: Will add last result for INR, ABG once components are confirmed Will add last 4 CBG results once components are confirmed  Assessment/Plan:  1. CV - SR. Continue with Lopressor 25 bid.  2.  Pulmonary - s/p left thoracentesis 12/14 (approximately 500 cc removed). Right pleurex drained  12/14 350 ccs.Check CXR am. Right pleurex to be drained today.  3.  Acute blood loss anemia - H/H stable at 9.2/29. 4.INR down to 2.19 so will pull EPW today and resume Coumadin tonight. 5.Hopefully, to SNF on Monday.   Ardelle Balls, PA-C 02/08/2011    Chart  reviewed, patient examined, agree with above.

## 2011-02-09 ENCOUNTER — Inpatient Hospital Stay (HOSPITAL_COMMUNITY): Payer: 59

## 2011-02-09 LAB — BODY FLUID CULTURE: Culture: NO GROWTH

## 2011-02-09 LAB — TSH: TSH: 9.986 u[IU]/mL — ABNORMAL HIGH (ref 0.350–4.500)

## 2011-02-09 LAB — BASIC METABOLIC PANEL
CO2: 30 mEq/L (ref 19–32)
Chloride: 96 mEq/L (ref 96–112)
GFR calc Af Amer: 67 mL/min — ABNORMAL LOW (ref >90)
Potassium: 3.5 mEq/L (ref 3.5–5.1)
Sodium: 134 mEq/L — ABNORMAL LOW (ref 135–145)

## 2011-02-09 LAB — MAGNESIUM: Magnesium: 1.8 mg/dL (ref 1.5–2.5)

## 2011-02-09 MED ORDER — METOPROLOL TARTRATE 25 MG PO TABS
37.5000 mg | ORAL_TABLET | Freq: Two times a day (BID) | ORAL | Status: DC
  Administered 2011-02-09 – 2011-02-10 (×3): 37.5 mg via ORAL
  Filled 2011-02-09 (×4): qty 1

## 2011-02-09 MED ORDER — FUROSEMIDE 40 MG PO TABS: 40.0000 mg | ORAL_TABLET | Freq: Every day | ORAL | Status: DC

## 2011-02-09 MED ORDER — METOPROLOL TARTRATE 12.5 MG HALF TABLET: 37.5000 mg | ORAL_TABLET | Freq: Two times a day (BID) | ORAL | Status: DC

## 2011-02-09 MED ORDER — POTASSIUM CHLORIDE CRYS ER 20 MEQ PO TBCR: 20.0000 meq | EXTENDED_RELEASE_TABLET | Freq: Every day | ORAL | Status: DC

## 2011-02-09 NOTE — Progress Notes (Addendum)
[address]            [place] [address]          [phone]     4 Days Post-Op  Procedure(s) (LRB): INSERTION PLEURAL DRAINAGE CATHETER (Right) Subjective: Having paroxysms of atrial fibrillation  Objective  Telemetry currently NSR  Temp:  [97.9 F (36.6 C)-98 F (36.7 C)] 97.9 F (36.6 C) (12/17 0500) Pulse Rate:  [58-110] 89  (12/17 0500) Resp:  [17-19] 19  (12/17 0500) BP: (92-119)/(50-70) 116/57 mmHg (12/17 0500) SpO2:  [95 %-98 %] 97 % (12/17 0500) Weight:  [123 lb 14.4 oz (56.201 kg)] 123 lb 14.4 oz (56.201 kg) (12/17 0500)   Intake/Output Summary (Last 24 hours) at 02/09/11 0748 Last data filed at 02/09/11 0500  Gross per 24 hour  Intake    480 ml  Output   1100 ml  Net   -620 ml       General appearance: alert, cooperative and no distress Heart: regular rate and rhythm and S1, S2 normal Lungs: Crackles L base, diminished R base Abdomen: soft, non-tender; bowel sounds normal; no masses,  no organomegaly Extremities: minor lower ext edema Wound: incisions healing well  Lab Results:  Basename 02/07/11 0630  NA 134*  K 3.1*  CL 96  CO2 31  GLUCOSE 84  BUN 12  CREATININE 0.94  CALCIUM 8.6  MG --  PHOS --   No results found for this basename: AST:2,ALT:2,ALKPHOS:2,BILITOT:2,PROT:2,ALBUMIN:2 in the last 72 hours No results found for this basename: LIPASE:2,AMYLASE:2 in the last 72 hours  Basename 02/07/11 0630  WBC 5.2  NEUTROABS --  HGB 9.2*  HCT 29.0*  MCV 93.5  PLT 160   No results found for this basename: CKTOTAL:4,CKMB:4,TROPONINI:4 in the last 72 hours No components found with this basename: POCBNP:3 No results found for this basename: DDIMER in the last 72 hours No results found for this basename: HGBA1C in the last 72 hours No results found for this basename: CHOL,HDL,LDLCALC,TRIG,CHOLHDL in the last 72 hours No results found for this basename: TSH,T4TOTAL,FREET3,T3FREE,THYROIDAB in the last 72  hours No results found for this basename: VITAMINB12,FOLATE,FERRITIN,TIBC,IRON,RETICCTPCT in the last 72 hours  Medications: Scheduled    . docusate sodium  200 mg Oral Daily  . feeding supplement  237 mL Oral TID WC  . furosemide  40 mg Oral BID  . levothyroxine  75 mcg Oral Daily  . metoprolol tartrate  25 mg Oral BID  . pantoprazole  40 mg Oral QAC breakfast  . potassium chloride  40 mEq Oral Daily  . povidone-iodine  1 application Topical BID  . sodium chloride  10 mL Intracatheter Q12H  . warfarin  2 mg Oral q1800  . warfarin   Does not apply Once     Radiology/Studies:  Dg Chest 2 View  02/07/2011  *RADIOLOGY REPORT*  Clinical Data: Status post CABG.  Pleural effusions.  CHEST - 2 VIEW  Comparison: Single view of the chest 02/06/2011.  Findings: Small bilateral pleural effusions and basilar airspace disease have improved, particularly on the right.  Small bore right chest tube remains in place.  No pneumothorax.  Heart size normal.  IMPRESSION:  1.  Marked improvement in right effusion and basilar airspace disease with improved aeration also seen in the left base. 2.  Right chest tube in place.  No pneumothorax.  Original Report Authenticated By: Bernadene Bell.  D'ALESSIO, M.D.    INR:1.77 Will add last result for INR, ABG once components are confirmed Will add last 4 CBG results once components are confirmed  Assessment/Plan: S/P Procedure(s) (LRB): INSERTION PLEURAL DRAINAGE CATHETER (Right)  1. PAF, + history in past, cont coumadin and Beta blocker( will increase dose) , Will have to determine if other agents are required or is rate control and anticoagulation acceptable. 2. Re check TSH and lytes 3. Decrease lasix to once daily at d/c  Rashaad Hallstrom E 12/17/20127:48 AM    I have seen and examined the patient and agree with the assessment and plan as outlined.  One brief episode asymptomatic PAF, otherwise maintaining NSR.  Will increase beta blocker.  Overall I think she  looks good and is okay for d/c to SNF.  OWEN,CLARENCE H 02/09/2011 8:42 AM

## 2011-02-09 NOTE — Progress Notes (Addendum)
CSW advised pt is medically ready for d/c. Pt has bed available and will d/c to [facility] today. CSW in contact with pt daughter to ensure admissions paperwork at SNF is completed. Plan for d/c this afternoon.  Baxter Flattery, MSW [phone]   Unable to d/c today due to pt daughter decision this morning for different SNF. CSW working all day with [facility], but unable to [employer] authorization today. Plan is for d/c tomorrow to [facility], also per Dr Cornelius Moras request to not d/c pt in the evening hours.   Baxter Flattery, MSW [phone]

## 2011-02-09 NOTE — Progress Notes (Signed)
Entered patient room to administer her 10pm meds and patient was laying in bed. When she woke up, she thought it was morning. I was called by telemetry clerk and she was in AFIB with HR 120-140s. Patient was asymptomatic. EKG was done but patient had converted back to NSR at 2226 before it could be captured. She went back into AFIB at 2242 and then back into NSR at 2248.  Patient has been switching back and forth a few times throughout the night. Will continue to monitor.

## 2011-02-09 NOTE — Progress Notes (Signed)
Physical Therapy Treatment Patient Details Name: Desiree [name] MRN: [id number] DOB: 07-01-1924 Today's Date: 02/09/2011  PT Assessment/Plan  PT - Assessment/Plan Comments on Treatment Session: still most limited by tolerance to activity PT Plan: Discharge plan remains appropriate Follow Up Recommendations: Skilled nursing facility Equipment Recommended: Defer to next venue PT Goals  Acute Rehab PT Goals PT Goal Formulation: With patient PT Goal: Sit to Stand - Progress: Progressing toward goal PT Goal: Stand to Sit - Progress: Progressing toward goal PT Goal: Ambulate - Progress: Met  PT Treatment Precautions/Restrictions  Precautions Precautions: Fall;Sternal Required Braces or Orthoses: No Restrictions Weight Bearing Restrictions: No Mobility (including Balance) Bed Mobility Bed Mobility: No Transfers Sit to Stand: Other (comment) (min guard A) Sit to Stand Details (indicate cue type and reason): still needs verbal reinforcement to not use her UE's Stand to Sit: 5: Supervision Stand to Sit Details: vc's for not using her UE's to control her descent Ambulation/Gait Ambulation/Gait Assistance: 5: Supervision Ambulation/Gait Assistance Details (indicate cue type and reason): vc's for postural checks and occasionally to stay closer to the RW Ambulation Distance (Feet): 450 Feet Assistive device: Rolling walker Gait Pattern: Within Functional Limits  Posture/Postural Control Postural Limitations:  (trunk progressively more upright and less flexed with fatigu) Exercise    End of Session PT - End of Session Activity Tolerance: Patient tolerated treatment well Patient left: in chair Nurse Communication: Mobility status for ambulation General Behavior During Session: [facility] for tasks performed Cognition: [facility] for tasks performed  Danitra Payano, Eliseo Gum 02/09/2011, 5:37 PM  02/09/2011  [place] Bing, PT [phone] (pager)

## 2011-02-09 NOTE — Progress Notes (Signed)
CARDIAC REHAB PHASE I   PRE:  Rate/Rhythm: 89SR  BP:  Supine:   Sitting: 102/49  Standing:    SaO2: 95%RA  MODE:  Ambulation: 550 ft   POST:  Rate/Rhythem: 98SR  BP:  Supine:   Sitting: 122/50  Standing:    SaO2: 96%RA 0936-1035 Pt walked 566ft on RA with rolling walker and asst x 1. Followed with chair and pt sat twice to rest. Tolerated well. To chair with call bell. Education completed. Permission given to refer to [facility] Phase 2.  Duanne Limerick

## 2011-02-10 LAB — PROTIME-INR
INR: 1.96 — ABNORMAL HIGH (ref 0.00–1.49)
Prothrombin Time: 22.7 seconds — ABNORMAL HIGH (ref 11.6–15.2)

## 2011-02-10 MED ORDER — LEVOTHYROXINE SODIUM 125 MCG PO TABS
125.0000 ug | ORAL_TABLET | Freq: Every day | ORAL | Status: DC
Start: 2011-02-10 — End: 2011-02-10
  Filled 2011-02-10 (×2): qty 1

## 2011-02-10 NOTE — Progress Notes (Signed)
CARDIAC REHAB PHASE I   PRE:  Rate/Rhythm: 93SR  BP:  Supine:   Sitting: 120/70  Standing:    SaO2: 93%RA  MODE:  Ambulation: 700 ft   POST:  Rate/Rhythem: 92SR  BP:    Sitting: 110/70  Standing:    SaO2: 97%RA 0945-1040 Pt walked 700 ft on RA with rolling walker and asst x 1. Followed with chair and pt sat three times to rest. C/o dizziness that improved with walk. TO chair and call bell after walk.  Duanne Limerick

## 2011-02-10 NOTE — Progress Notes (Addendum)
[address]            [place] [address]          [phone]     5 Days Post-Op  Procedure(s) (LRB): INSERTION PLEURAL DRAINAGE CATHETER (Right) Subjective: Feels ok, slept pretty well  Objective  Telemetry NSR  Temp:  [98 F (36.7 C)-98.4 F (36.9 C)] 98 F (36.7 C) (12/18 0351) Pulse Rate:  [20-92] 89  (12/18 0351) Resp:  [20] 20  (12/18 0351) BP: (96-128)/(58-73) 96/58 mmHg (12/18 0351) SpO2:  [92 %-95 %] 92 % (12/18 0351) Weight:  [124 lb 6.4 oz (56.427 kg)] 124 lb 6.4 oz (56.427 kg) (12/18 0351)   Intake/Output Summary (Last 24 hours) at 02/10/11 0752 Last data filed at 02/10/11 0356  Gross per 24 hour  Intake      0 ml  Output   1400 ml  Net  -1400 ml       General appearance: alert, cooperative, fatigued and no distress Heart: regular rate and rhythm and S1, S2 normal Lungs: mildly diminished in the Left base Abdomen: soft, non-tender; bowel sounds normal; no masses,  no organomegaly Extremities: minor LE edema Wound: incisions healing well without signs of infection  Lab Results:  Basename 02/09/11 1015  NA 134*  K 3.5  CL 96  CO2 30  GLUCOSE 117*  BUN 13  CREATININE 0.89  CALCIUM 9.0  MG 1.8  PHOS --   No results found for this basename: AST:2,ALT:2,ALKPHOS:2,BILITOT:2,PROT:2,ALBUMIN:2 in the last 72 hours No results found for this basename: LIPASE:2,AMYLASE:2 in the last 72 hours No results found for this basename: WBC:2,NEUTROABS:2,HGB:2,HCT:2,MCV:2,PLT:2 in the last 72 hours No results found for this basename: CKTOTAL:4,CKMB:4,TROPONINI:4 in the last 72 hours No components found with this basename: POCBNP:3 No results found for this basename: DDIMER in the last 72 hours No results found for this basename: HGBA1C in the last 72 hours No results found for this basename: CHOL,HDL,LDLCALC,TRIG,CHOLHDL in the last 72 hours  Basename 02/09/11 1015  TSH 9.986*  T4TOTAL --  T3FREE --  THYROIDAB --   No  results found for this basename: VITAMINB12,FOLATE,FERRITIN,TIBC,IRON,RETICCTPCT in the last 72 hours  Medications: Scheduled    . docusate sodium  200 mg Oral Daily  . feeding supplement  237 mL Oral TID WC  . furosemide  40 mg Oral BID  . levothyroxine  75 mcg Oral Daily  . metoprolol tartrate  37.5 mg Oral BID  . pantoprazole  40 mg Oral QAC breakfast  . potassium chloride  40 mEq Oral Daily  . povidone-iodine  1 application Topical BID  . sodium chloride  10 mL Intracatheter Q12H  . warfarin  2 mg Oral q1800  . warfarin   Does not apply Once  . DISCONTD: metoprolol tartrate  25 mg Oral BID     Radiology/Studies:  Dg Chest 2 View  02/09/2011  *RADIOLOGY REPORT*  Clinical Data: Post right-sided Pleurx catheter placement and left sided thoracentesis  CHEST - 2 VIEW  Comparison: 02/07/2011; 02/06/2011; 02/05/2011  Findings: Grossly unchanged cardiac silhouette and mediastinal contours post median sternotomy and valve repair.  Unchanged positioning of right basilar Pleurx catheter and small right-sided effusion.  Grossly unchanged appearance of small left-sided pleural effusion.  No pneumothorax.  Stable position of right upper extremity approach PICC line with tip over the mid SVC. No change to minimal increase in left basilar heterogeneous  opacities. Unchanged bones.  Surgical clip overlies the inferior aspect of the right neck.  IMPRESSION: 1.  Stable positioning of support apparatus.  No pneumothorax. 2.  Grossly unchanged appearance of small bilateral effusions, right greater than left.  3. No change minimal increase in left basilar heterogeneous opacities, possibly atelectasis though developing infection is not excluded.  Original Report Authenticated By: Waynard Reeds, M.D.    INR:1.96 Will add last result for INR, ABG once components are confirmed Will add last 4 CBG results once components are confirmed  Assessment/Plan: S/P Procedure(s) (LRB): INSERTION PLEURAL DRAINAGE  CATHETER (Right) 1. Conts to recover slowly 2. Cont current Coumadin dose 3. With tsh 9.96 will increase supplement 4. To snf when bed available  LOS: 20 days    GOLD,WAYNE E 12/18/20127:52 AM    I have seen and examined the patient and agree with the assessment and plan as outlined.  Ready for d/c to SNF  [facility] H 02/10/2011 8:12 AM

## 2011-02-10 NOTE — Discharge Planning (Addendum)
                   [address]            [name] [address]          [phone]    This is a brief addendum to discharge summary. The patient is tentatively scheduled for discharge to a skilled nursing facility on yesterday's date. Her family has now decided to change facilities. These arrangements are currently in process. Please see the previously dictated summary.  On yesterday's date the patient did have a brief episode of atrial fibrillation. We have adjusted her beta blocker. Additionally her TSH was checked and this is elevated at 9.96. We have therefore adjusted her Synthroid dose as well.   Her Synthroid dose will now be 125 mcg by mouth daily.   Her current Lopressor dose is now 37.5 mg by mouth twice a day  Her current Lasix dose is now 40 mg by mouth daily  Her current potassium dose is now 20 mEq by mouth daily  All other medications are currently as previously dictated. Plans in followup are as previously dictated.  Gershon Crane PA-C  I agree with the above discharge summary and plan for follow-up.  OWEN,CLARENCE H

## 2011-02-10 NOTE — Progress Notes (Addendum)
Pt has accepted bed at [facility] and insurance authorization is ok. Pt to d/c to SNF this afternoon, once PICC line is out. RN will advise CSW when pt is ready for transfer by [facility]. CSW advised pt of plan. Pt daughter to complete paperwork at SNF.  Med adjustments have been sent to SNF.   Baxter Flattery, MSW [phone]  CSW called [facility] for 1330 pickup to transfer pt to [facility]. Pt daughter advised.  CSW signing off.

## 2011-02-10 NOTE — Progress Notes (Signed)
OT session attempted, however pt. Anticipates D/C to SNF today and recently completed walk with cardiac rehab and declines activity at this time.   Cassandria Anger, OTR/L Pager: [phone] 02/10/2011 .
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
## 2011-02-12 ENCOUNTER — Telehealth: Payer: Self-pay | Admitting: Surgical

## 2011-02-12 NOTE — Telephone Encounter (Signed)
I received a phone call on 02/11/2011 from her social work department. They advised me that the [facility] where [facility]e [name] was discharged to had some questions. I spoke with Desiree [name] at telephone numbers [phone] who had a lot of questions regarding the patient. Apparently they received only a sparse documentation of her hospitalization. They had a discharge summary addendum but not the full discharge summary. Additionally they lacked evidence of what procedures the patient had previously undergone. Additionally they were unaware that the patient had a Pleurx catheter and did not have the supplies associated with managing this. I discussed with her the need to contact our social work department to obtain copies of the chart. Additionally she needs to contact the hospital for assistance in obtaining the Pleurx catheter bottle system. I made Dr. Cornelius Moras aware of this phone call and he plans to discuss this with our office nurse to help assist in management of the Pleurx  issue.

## 2011-02-16 ENCOUNTER — Telehealth: Payer: Self-pay | Admitting: Thoracic Surgery (Cardiothoracic Vascular Surgery)

## 2011-02-16 NOTE — Telephone Encounter (Signed)
I received a telephone call from Mrs Kinnison's daughter asking questions about her PleurX catheter.  I explained that we had intended to drain the catheter every other day initially, and subsequently decrease the frequency as catheter output declines.  She could not give specifics about current catheter output volume.  I emphasized the fact that we don't want to remove the catheter until it's clear that she no longer needs it.  She also asked about coumadin therapy.  All questions answered.  [place] H 02/16/2011 10:36 AM

## 2011-02-26 ENCOUNTER — Other Ambulatory Visit: Payer: Self-pay | Admitting: Thoracic Surgery (Cardiothoracic Vascular Surgery)

## 2011-02-26 DIAGNOSIS — I059 Rheumatic mitral valve disease, unspecified: Secondary | ICD-10-CM

## 2011-03-02 ENCOUNTER — Ambulatory Visit (INDEPENDENT_AMBULATORY_CARE_PROVIDER_SITE_OTHER): Payer: Self-pay | Admitting: Physician Assistant

## 2011-03-02 ENCOUNTER — Ambulatory Visit
Admission: RE | Admit: 2011-03-02 | Discharge: 2011-03-02 | Disposition: A | Discharge status: Home / Self Care | Payer: Medicare Other | Source: Ambulatory Visit | Attending: Thoracic Surgery (Cardiothoracic Vascular Surgery) | Admitting: Thoracic Surgery (Cardiothoracic Vascular Surgery)

## 2011-03-02 VITALS — BP 121/70 | HR 70 | Resp 18 | Ht <= 58 in | Wt 124.0 lb

## 2011-03-02 DIAGNOSIS — I059 Rheumatic mitral valve disease, unspecified: Secondary | ICD-10-CM

## 2011-03-02 DIAGNOSIS — J90 Pleural effusion, not elsewhere classified: Secondary | ICD-10-CM

## 2011-03-02 DIAGNOSIS — Z9889 Other specified postprocedural states: Secondary | ICD-10-CM

## 2011-03-02 DIAGNOSIS — I340 Nonrheumatic mitral (valve) insufficiency: Secondary | ICD-10-CM

## 2011-03-02 DIAGNOSIS — I341 Nonrheumatic mitral (valve) prolapse: Secondary | ICD-10-CM
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
## 2011-03-04 ENCOUNTER — Other Ambulatory Visit: Payer: Self-pay | Admitting: Thoracic Surgery (Cardiothoracic Vascular Surgery)

## 2011-03-04 DIAGNOSIS — I059 Rheumatic mitral valve disease, unspecified: Secondary | ICD-10-CM

## 2011-03-09 ENCOUNTER — Ambulatory Visit
Admission: RE | Admit: 2011-03-09 | Discharge: 2011-03-09 | Disposition: A | Discharge status: Home / Self Care | Payer: 59 | Source: Ambulatory Visit | Attending: Thoracic Surgery (Cardiothoracic Vascular Surgery) | Admitting: Thoracic Surgery (Cardiothoracic Vascular Surgery)

## 2011-03-09 ENCOUNTER — Ambulatory Visit (INDEPENDENT_AMBULATORY_CARE_PROVIDER_SITE_OTHER): Payer: Self-pay | Admitting: Physician Assistant

## 2011-03-09 VITALS — BP 130/70 | HR 94 | Resp 16 | Ht <= 58 in | Wt 118.0 lb

## 2011-03-09 DIAGNOSIS — I059 Rheumatic mitral valve disease, unspecified: Secondary | ICD-10-CM

## 2011-03-09 DIAGNOSIS — Z09 Encounter for follow-up examination after completed treatment for conditions other than malignant neoplasm: Secondary | ICD-10-CM

## 2011-03-09 DIAGNOSIS — J90 Pleural effusion, not elsewhere classified: Secondary | ICD-10-CM

## 2011-03-09 NOTE — Progress Notes (Signed)
HPI: Patient returns for routine postoperative follow-up having undergone mitral valve repair by Dr. Cornelius Moras on 01/21/2011 . The patient's postoperative course was notable for atrial fibrillation and recurrent bilateral pleural effusions requiring bilateral thoracenteses and a right Pleurx catheter placement.  Since hospital discharge the patient has been cared for in the [facility].  She had issues with nausea, but when she saw Dr. [name], he discontinued her amiodarone. Since that time her nausea has improved significantly, although she still has decreased appetite. She is doing well with physical therapy and although she is still weak she currently is able to walk with walker without assistance. She will hopefully be discharged to home with her family later this week. She has had issues with her anticoagulation and although I don't have the records today according to her daughter her INR has been as high as 6 and her Coumadin was held for some time. She is currently on 2 mg daily and her INR was last checked on 1/10 although they do not know what her INR was at that time. Her Pleurx catheter is being drained on Monday Wednesday and Friday and they are getting about 150 cc of serous sanguinous fluid with each drainage session. She denies any shortness of breath and has had no pain or lower extremity edema.  Current Outpatient Prescriptions  Medication Sig Dispense Refill  . aspirin 81 MG tablet Take 81 mg by mouth daily.       Marland Kitchen atorvastatin (LIPITOR) 80 MG tablet Take 80 mg by mouth at bedtime.       . furosemide (LASIX) 40 MG tablet Take 1 tablet (40 mg total) by mouth daily.  30 tablet    . levothyroxine (SYNTHROID, LEVOTHROID) 75 MCG tablet Take 75 mcg by mouth daily.       . metoprolol tartrate (LOPRESSOR) 12.5 mg TABS Take 1.5 tablets (37.5 mg total) by mouth 2 (two) times daily.      . pantoprazole (PROTONIX) 40 MG tablet Take 1 tablet (40 mg total) by mouth daily before breakfast. Patient may  stop once discharged from SNF.      Marland Kitchen Polyethyl Glycol-Propyl Glycol (SYSTANE OP) Apply 2 drops to eye 4 (four) times daily as needed. For dry eyes.      . polyethylene glycol (MIRALAX / GLYCOLAX) packet Take 17 g by mouth daily.        . traMADol (ULTRAM) 50 MG tablet Take 50 mg by mouth every 8 (eight) hours as needed.        . warfarin (COUMADIN) 2 MG tablet Take 1 tablet (2 mg total) by mouth daily at 6 PM. Or as directed by Dr. [name]        Physical Exam: BP 130/70 HR 94 Resp 16 Wounds: clean and dry, sternum stable to palpation Heart: RRR Lungs: slightly decreased BS right base Extremities: no edema PleuRx catheter: 125 cc serosanguinous fluid drained today   Diagnostic Tests: Chest xray: small right effusion, otherwise stable  Assessment/Plan: She slowly but steadily progressing. Since her Pleurx catheter drainage is slowing we will decrease the drainage sessions to just Monday and Friday and the facility will arrange for home health nurse to do this when she is discharged home. I also plan to arrange for home health PT and I agree this is a good idea although she does seem to be much stronger. We will plan to see her back in 2 weeks with a repeat chest x-ray and hopefully we can consider removing her Pleurx  catheter in the near future. She will followup as directed with Dr. [name].

## 2011-03-16 ENCOUNTER — Ambulatory Visit (INDEPENDENT_AMBULATORY_CARE_PROVIDER_SITE_OTHER): Payer: Self-pay

## 2011-03-16 VITALS — BP 115/63 | HR 84 | Temp 97.2°F | Resp 16 | Ht 59.0 in | Wt 120.0 lb

## 2011-03-16 DIAGNOSIS — J90 Pleural effusion, not elsewhere classified: Secondary | ICD-10-CM

## 2011-03-16 NOTE — Progress Notes (Signed)
Ms. Desiree [name] returns to the office for her R pleurX to be drained...Marland Kitchen75cc was drained without difficulty or discomfort.  She will rtc Friday to see Dr. Cornelius Moras as scheduled.

## 2011-03-18 ENCOUNTER — Ambulatory Visit: Payer: 59 | Admitting: Thoracic Surgery (Cardiothoracic Vascular Surgery)

## 2011-03-19 ENCOUNTER — Other Ambulatory Visit: Payer: Self-pay | Admitting: Thoracic Surgery (Cardiothoracic Vascular Surgery)

## 2011-03-19 DIAGNOSIS — I059 Rheumatic mitral valve disease, unspecified: Secondary | ICD-10-CM

## 2011-03-20 ENCOUNTER — Ambulatory Visit
Admission: RE | Admit: 2011-03-20 | Discharge: 2011-03-20 | Disposition: A | Discharge status: Home / Self Care | Payer: 59 | Source: Ambulatory Visit | Attending: Thoracic Surgery (Cardiothoracic Vascular Surgery) | Admitting: Thoracic Surgery (Cardiothoracic Vascular Surgery)

## 2011-03-20 ENCOUNTER — Encounter: Payer: Self-pay | Admitting: Thoracic Surgery (Cardiothoracic Vascular Surgery)

## 2011-03-20 ENCOUNTER — Ambulatory Visit (INDEPENDENT_AMBULATORY_CARE_PROVIDER_SITE_OTHER): Payer: Self-pay | Admitting: Thoracic Surgery (Cardiothoracic Vascular Surgery)

## 2011-03-20 VITALS — BP 121/64 | HR 81 | Temp 97.1°F | Resp 14 | Wt 122.0 lb

## 2011-03-20 DIAGNOSIS — I059 Rheumatic mitral valve disease, unspecified: Secondary | ICD-10-CM

## 2011-03-20 DIAGNOSIS — Z9889 Other specified postprocedural states: Secondary | ICD-10-CM

## 2011-03-20 DIAGNOSIS — J90 Pleural effusion, not elsewhere classified: Secondary | ICD-10-CM

## 2011-03-20 NOTE — Progress Notes (Signed)
[address]            [name] [address]          [phone]     CARDIOTHORACIC SURGERY OFFICE NOTE  Referring Provider is Georga Hacking, MD PCP is Ancil Boozer, MD, MD   HPI:  Patient returns for follow-up s/p mitral valve repair 01/21/2011.  She was last seen here in our office on 03/09/2011.  She is now staying at home with her daughter. Overall she is made slow gradual progress. Her Pleurx catheter has been drained twice per week. She states that she seems to have more pain surrounding her Pleurx catheter, although she has always had a fair amount of pain ever since the catheter was placed. She states that over the last couple of days she has had a "tickle in her throat" but no cough. She is not having any shortness of breath. She is not having fevers or chills. She's not keeping a record of her weight nor the precise wine output from her Pleurx catheter.  She has not been sleeping well for the last few days but she has no specific reason. Home health physical therapy apparently is working with her at home health nursing care is managing her Pleurx catheter.  Her appetite apparently has improved.   Current Outpatient Prescriptions  Medication Sig Dispense Refill  . aspirin 81 MG tablet Take 81 mg by mouth daily.       Marland Kitchen atorvastatin (LIPITOR) 80 MG tablet Take 80 mg by mouth at bedtime.       . furosemide (LASIX) 40 MG tablet Take 1 tablet (40 mg total) by mouth daily.  30 tablet    . HYDROcodone-acetaminophen (VICODIN) 5-500 MG per tablet Take 1 tablet by mouth every 6 (six) hours as needed.      Marland Kitchen levothyroxine (SYNTHROID, LEVOTHROID) 75 MCG tablet Take 75 mcg by mouth daily.       . metoprolol tartrate (LOPRESSOR) 12.5 mg TABS Take 1.5 tablets (37.5 mg total) by mouth 2 (two) times daily.      . pantoprazole (PROTONIX) 40 MG tablet Take 1 tablet (40 mg total) by mouth daily before breakfast. Patient may stop once discharged from SNF.      Marland Kitchen  Polyethyl Glycol-Propyl Glycol (SYSTANE OP) Apply 2 drops to eye 4 (four) times daily as needed. For dry eyes.      . polyethylene glycol (MIRALAX / GLYCOLAX) packet Take 17 g by mouth daily.        Marland Kitchen warfarin (COUMADIN) 2 MG tablet Take 1 tablet (2 mg total) by mouth daily at 6 PM. Or as directed by Dr. [name]      . traMADol (ULTRAM) 50 MG tablet Take 50 mg by mouth every 8 (eight) hours as needed.            Physical Exam:   BP 121/64  Pulse 81  Temp 97.1 F (36.2 C)  Resp 14  Wt 122 lb (55.339 kg)  SpO2 97%  General:  Elderly, frail  Chest:   Clear, Pleurx exit site clean, sternotomy healing welll  CV:   RRR no murmur  Abdomen:  soft  Extremities:  Warm, mild LE edema  Diagnostic Tests:  Chest x-ray performed today looks clear with very small right pleural effusion. This x-ray was done prior to drainage of or Pleurx catheter. The lung fields are otherwise  clear.  Pleurx catheter is draining here in our office yielding 250 mL of thin serous fluid.   Impression:  Slow progress following mitral valve repair. Overall patient seems to be doing fairly well. She has problems with chronic pain that predated her surgery, and she has had pain related to her Pleurx catheter ever since it was placed. The Pleurx catheter is still draining a small amount of fluid which is not unexpected onto the circumstances.  Plan:  We will changed frequency of draining her catheter to 3 times a week for the next 2 weeks. I would expect that we should be able to remove the Pleurx catheter in the very near future. I suggested that she should keep track of her weight to make sure that she is not gaining weight, suggestive of the possibility of fluid retention. Have reminded the patient to avoid any sort of heavy lifting or strenuous use of her arms or shoulders. I've encouraged her to otherwise start trying to push herself physically to work on her strengthening and rehabilitation. Ultimately she would  benefit from the outpatient cardiac rehabilitation program.   Salvatore Decent. Cornelius Moras, MD 03/20/2011 11:45 AM

## 2011-03-20 NOTE — Patient Instructions (Signed)
Avoid any heavy lifting or strenuous use of arms or shoulders. Otherwise make an effort to increase physical activity and work with physical therapy. Patient may shower as long as the dressing around the Pleurx catheter can be changed. Drain Pleurx catheter 3 times per week for the next 2 weeks. Record weight every day. Record output from Pleurx catheter every time it is drained.

## 2011-04-01 ENCOUNTER — Other Ambulatory Visit: Payer: Self-pay | Admitting: Thoracic Surgery (Cardiothoracic Vascular Surgery)

## 2011-04-01 DIAGNOSIS — I059 Rheumatic mitral valve disease, unspecified: Secondary | ICD-10-CM

## 2011-04-06 ENCOUNTER — Ambulatory Visit (INDEPENDENT_AMBULATORY_CARE_PROVIDER_SITE_OTHER): Payer: Self-pay | Admitting: Physician Assistant

## 2011-04-06 ENCOUNTER — Ambulatory Visit
Admission: RE | Admit: 2011-04-06 | Discharge: 2011-04-06 | Disposition: A | Discharge status: Home / Self Care | Payer: 59 | Source: Ambulatory Visit | Attending: Thoracic Surgery (Cardiothoracic Vascular Surgery) | Admitting: Thoracic Surgery (Cardiothoracic Vascular Surgery)

## 2011-04-06 ENCOUNTER — Ambulatory Visit: Payer: 59 | Admitting: Thoracic Surgery (Cardiothoracic Vascular Surgery)

## 2011-04-06 VITALS — BP 138/71 | HR 98 | Resp 16 | Wt 124.0 lb

## 2011-04-06 DIAGNOSIS — Z09 Encounter for follow-up examination after completed treatment for conditions other than malignant neoplasm: Secondary | ICD-10-CM

## 2011-04-06 DIAGNOSIS — I059 Rheumatic mitral valve disease, unspecified: Secondary | ICD-10-CM

## 2011-04-06 DIAGNOSIS — J90 Pleural effusion, not elsewhere classified: Secondary | ICD-10-CM

## 2011-04-06 NOTE — Progress Notes (Signed)
                    [address]            [name] [address]          [phone]     HPI: Patient returns for followup from mitral valve repair on 01/21/2011. She also had a right Pleurx catheter placed for recurrent pleural effusion.  She continues to drain 150-200 cc serous fluid 3 times weekly. She denies any shortness of breath or discomfort. Her weight has remained stable between 120-124 pounds. Her lower extremity edema is decreasing. She overall is improving slowly but steadily from surgery.  Current Outpatient Prescriptions  Medication Sig Dispense Refill  . aspirin 81 MG tablet Take 81 mg by mouth daily.       Marland Kitchen atorvastatin (LIPITOR) 80 MG tablet Take 80 mg by mouth at bedtime.       . furosemide (LASIX) 40 MG tablet Take 1 tablet (40 mg total) by mouth daily.  30 tablet    . HYDROcodone-acetaminophen (VICODIN) 5-500 MG per tablet Take 1 tablet by mouth every 6 (six) hours as needed.      Marland Kitchen levothyroxine (SYNTHROID, LEVOTHROID) 75 MCG tablet Take 75 mcg by mouth daily.       . metoprolol tartrate (LOPRESSOR) 12.5 mg TABS Take 1.5 tablets (37.5 mg total) by mouth 2 (two) times daily.      Bertram Gala Glycol-Propyl Glycol (SYSTANE OP) Apply 2 drops to eye 4 (four) times daily as needed. For dry eyes.      . polyethylene glycol (MIRALAX / GLYCOLAX) packet Take 17 g by mouth daily.        Marland Kitchen warfarin (COUMADIN) 2 MG tablet Take 1 tablet (2 mg total) by mouth daily at 6 PM. Or as directed by Dr. [name]      . pantoprazole (PROTONIX) 40 MG tablet Take 1 tablet (40 mg total) by mouth daily before breakfast. Patient may stop once discharged from SNF.      Marland Kitchen traMADol (ULTRAM) 50 MG tablet Take 50 mg by mouth every 8 (eight) hours as needed.          Physical Exam: BP 138/71 HR 98 Resp 16 Wounds: Clean and dry Heart: RRR Lungs: clear Extremities: Mild RLE edema  R PleuRx catheter drained 150 cc serous fluid in our office today.   Diagnostic Tests: Chest xray:  resolved R pleural effusion  Assessment/Plan: Ms. Buttery is progressing well status post mitral valve repair. Her right Pleurx catheter drainage continues to decrease. Her weight is remaining stable and she is only mild lower extremity edema on physical exam. Dr. Cornelius Moras saw the patient today and we will plan to perform a talc pleurodesis and remove her PleuRx catheter in the near future.

## 2011-04-07 ENCOUNTER — Other Ambulatory Visit: Payer: Self-pay

## 2011-04-07 DIAGNOSIS — J90 Pleural effusion, not elsewhere classified: Secondary | ICD-10-CM

## 2011-04-07 MED ORDER — TALC 5 G PL SUSR: 3.0000 g | Freq: Once | INTRAPLEURAL | Status: DC

## 2011-04-08 ENCOUNTER — Encounter (HOSPITAL_COMMUNITY): Payer: Self-pay | Admitting: Pharmacy Technician

## 2011-04-09 ENCOUNTER — Encounter (HOSPITAL_COMMUNITY): Payer: Self-pay | Admitting: *Deleted

## 2011-04-09 ENCOUNTER — Encounter (HOSPITAL_COMMUNITY)
Admission: RE | Disposition: A | Discharge status: Home / Self Care | Payer: Self-pay | Source: Ambulatory Visit | Attending: Thoracic Surgery (Cardiothoracic Vascular Surgery)

## 2011-04-09 ENCOUNTER — Other Ambulatory Visit: Payer: Self-pay

## 2011-04-09 ENCOUNTER — Ambulatory Visit (HOSPITAL_COMMUNITY)
Admission: RE | Admit: 2011-04-09 | Discharge: 2011-04-09 | Disposition: A | Discharge status: Home / Self Care | Payer: 59 | Source: Ambulatory Visit | Attending: Thoracic Surgery (Cardiothoracic Vascular Surgery) | Admitting: Thoracic Surgery (Cardiothoracic Vascular Surgery)

## 2011-04-09 DIAGNOSIS — J90 Pleural effusion, not elsewhere classified: Secondary | ICD-10-CM

## 2011-04-09 DIAGNOSIS — Z4682 Encounter for fitting and adjustment of non-vascular catheter: Secondary | ICD-10-CM | POA: Insufficient documentation

## 2011-04-09 HISTORY — PX: REMOVAL OF PLEURAL DRAINAGE CATHETER: SHX5080

## 2011-04-09 HISTORY — PX: TALC PLEURODESIS: SHX2506

## 2011-04-09 SURGERY — REMOVAL, CLOSED DRAINAGE CATHETER SYSTEM, PLEURAL
Anesthesia: LOCAL | Site: Chest | Laterality: Right | Wound class: Clean Contaminated

## 2011-04-09 MED ORDER — TALC 5 G PL SUSR
3.0000 g | Freq: Once | INTRAPLEURAL | Status: DC
  Filled 2011-04-09: qty 5

## 2011-04-09 NOTE — Progress Notes (Signed)
                   [address]            [name] [address]          [phone]    Brief Procedure  Removal of Pleurx catheter Indication- decreasing pleural drainage, only 95 cc last drainage Prep- betadine Anest- 6 cc 2% lidocaine The catheter was removed intact using standard technique. Patient tolerated well.   Note- spoke with Dr Cornelius Moras prior to procedure , and as drainage is significantly decreasing no Talc was used at this time.

## 2011-04-09 NOTE — Discharge Instructions (Signed)
Leave dressing on for 24 hours and then may remove . May take a shower after dressing is off

## 2011-04-13 ENCOUNTER — Encounter (HOSPITAL_COMMUNITY): Payer: Self-pay | Admitting: Thoracic Surgery (Cardiothoracic Vascular Surgery)

## 2011-04-14 ENCOUNTER — Other Ambulatory Visit: Payer: Self-pay | Admitting: Thoracic Surgery (Cardiothoracic Vascular Surgery)

## 2011-04-14 DIAGNOSIS — J90 Pleural effusion, not elsewhere classified: Secondary | ICD-10-CM

## 2011-04-20 ENCOUNTER — Ambulatory Visit (INDEPENDENT_AMBULATORY_CARE_PROVIDER_SITE_OTHER): Payer: Self-pay | Admitting: Thoracic Surgery (Cardiothoracic Vascular Surgery)

## 2011-04-20 ENCOUNTER — Ambulatory Visit
Admission: RE | Admit: 2011-04-20 | Discharge: 2011-04-20 | Disposition: A | Discharge status: Home / Self Care | Payer: 59 | Source: Ambulatory Visit | Attending: Thoracic Surgery (Cardiothoracic Vascular Surgery) | Admitting: Thoracic Surgery (Cardiothoracic Vascular Surgery)

## 2011-04-20 ENCOUNTER — Encounter: Payer: Self-pay | Admitting: Thoracic Surgery (Cardiothoracic Vascular Surgery)

## 2011-04-20 VITALS — BP 144/70 | HR 94 | Resp 16 | Ht 59.0 in | Wt 124.0 lb

## 2011-04-20 DIAGNOSIS — I059 Rheumatic mitral valve disease, unspecified: Secondary | ICD-10-CM

## 2011-04-20 DIAGNOSIS — Z9889 Other specified postprocedural states: Secondary | ICD-10-CM

## 2011-04-20 DIAGNOSIS — J90 Pleural effusion, not elsewhere classified: Secondary | ICD-10-CM

## 2011-04-20 DIAGNOSIS — Z09 Encounter for follow-up examination after completed treatment for conditions other than malignant neoplasm: Secondary | ICD-10-CM

## 2011-04-20 NOTE — Progress Notes (Signed)
[address]            [name] [address]          [phone]     CARDIOTHORACIC SURGERY OFFICE NOTE  Referring Provider is Elliot Gault Eilleen Kempf., * PCP is ALM,STEPHANIE, MD, MD   HPI:  Patient returns for follow-up following elective mitral valve repair nearly 3 months ago.  Patient's early postoperative recovery was slow and she developed persistent, recurrent right pleural effusion requiring Pleurx catheter for drainage. This has resolved and her Pleurx catheter was removed last week. She returns to the office today and reports that she is getting along quite well. She is back driving an automobile and living alone at home. She notes that her breathing is much better than it was prior to her original surgery. She still has mild residual soreness across her chest but she is no longer taking any sort of pain relievers and overall she's quite pleased with how she feels at this time. She is planning on starting the cardiac rehabilitation program within the next few weeks.   Current Outpatient Prescriptions  Medication Sig Dispense Refill  . aspirin 81 MG tablet Take 81 mg by mouth daily.       Marland Kitchen atorvastatin (LIPITOR) 80 MG tablet Take 80 mg by mouth at bedtime.       . furosemide (LASIX) 40 MG tablet Take 1 tablet (40 mg total) by mouth daily.  30 tablet    . HYDROcodone-acetaminophen (VICODIN) 5-500 MG per tablet Take 1 tablet by mouth every 6 (six) hours as needed. pain      . levothyroxine (SYNTHROID, LEVOTHROID) 75 MCG tablet Take 75 mcg by mouth daily.       . metoprolol tartrate (LOPRESSOR) 25 MG tablet Take 37.5 mg by mouth 2 (two) times daily.      Bertram Gala Glycol-Propyl Glycol (SYSTANE OP) Apply 2 drops to eye 4 (four) times daily as needed. For dry eyes.      . polyethylene glycol (MIRALAX / GLYCOLAX) packet Take 17 g by mouth daily.        . potassium chloride SA (K-DUR,KLOR-CON) 20 MEQ tablet Take 20 mEq by mouth 2 (two) times daily.        Marland Kitchen warfarin (COUMADIN) 2 MG tablet Take 2-3 mg by mouth daily at 6 PM. On Monday, Wed, and Friday 3 mg , 2 mg all other days.          Physical Exam:   BP 144/70  Pulse 94  Resp 16  Ht 4\' 11"  (1.499 m)  Wt 124 lb (56.246 kg)  BMI 25.04 kg/m2  SpO2 99%  General:  Well-appearing  Chest:   Clear to auscultation with symmetrical breath sounds. The sternotomy has healed nicely.  CV:   Regular rate and rhythm without murmur  Abdomen:  Soft and nontender  Extremities:  Warm, well-perfused, and without lower extremity edema  Diagnostic Tests:  Chest x-ray performed today looks good with trivial residual pleural effusions on both sides.   Impression:  Patient appears to be progressing nicely at this point nearly 3 months out following elective mitral valve repair. Her chronic right pleural effusion has resolved.  Plan:  I've encouraged patient to continue to gradually increase her physical activity as tolerated without any particular limitations at this time. I've encouraged her to participate in the cardiac rehabilitation program. We will  plan to see her back in 3 months just make her that she continues to recover uneventfully.   Salvatore Decent. Cornelius Moras, MD 04/20/2011 4:42 PM

## 2011-04-30 ENCOUNTER — Encounter (HOSPITAL_COMMUNITY)
Admission: RE | Admit: 2011-04-30 | Discharge: 2011-04-30 | Disposition: A | Discharge status: Home / Self Care | Payer: 59 | Source: Ambulatory Visit | Attending: Cardiology | Admitting: Cardiology

## 2011-04-30 ENCOUNTER — Encounter (HOSPITAL_COMMUNITY): Payer: Self-pay

## 2011-04-30 DIAGNOSIS — R011 Cardiac murmur, unspecified: Secondary | ICD-10-CM | POA: Insufficient documentation

## 2011-04-30 DIAGNOSIS — Z9889 Other specified postprocedural states: Secondary | ICD-10-CM | POA: Insufficient documentation

## 2011-04-30 DIAGNOSIS — Z5189 Encounter for other specified aftercare: Secondary | ICD-10-CM | POA: Insufficient documentation

## 2011-04-30 DIAGNOSIS — I059 Rheumatic mitral valve disease, unspecified: Secondary | ICD-10-CM | POA: Insufficient documentation

## 2011-04-30 DIAGNOSIS — I779 Disorder of arteries and arterioles, unspecified: Secondary | ICD-10-CM | POA: Insufficient documentation

## 2011-04-30 DIAGNOSIS — E785 Hyperlipidemia, unspecified: Secondary | ICD-10-CM | POA: Insufficient documentation

## 2011-04-30 DIAGNOSIS — E039 Hypothyroidism, unspecified: Secondary | ICD-10-CM | POA: Insufficient documentation

## 2011-04-30 DIAGNOSIS — E7800 Pure hypercholesterolemia, unspecified: Secondary | ICD-10-CM | POA: Insufficient documentation

## 2011-04-30 DIAGNOSIS — I509 Heart failure, unspecified: Secondary | ICD-10-CM | POA: Insufficient documentation

## 2011-04-30 DIAGNOSIS — I119 Hypertensive heart disease without heart failure: Secondary | ICD-10-CM | POA: Insufficient documentation

## 2011-04-30 DIAGNOSIS — I4891 Unspecified atrial fibrillation: Secondary | ICD-10-CM | POA: Insufficient documentation

## 2011-04-30 NOTE — Progress Notes (Signed)
Cardiac Rehab Medication Review by a Pharmacist  Does the patient  feel that his/her medications are working for him/her?  yes  Has the patient been experiencing any side effects to the medications prescribed? no  Does the patient measure his/her own blood pressure or blood glucose at home?  yes   Does the patient have any problems obtaining medications due to transportation or finances?   no  Understanding of regimen: excellent Understanding of indications: fair Potential of compliance: good - uses a pill box    Pharmacist comments: Would like to decrease the number of medications that she is on.     Concha Norway 04/30/2011 8:43 AM

## 2011-05-04 ENCOUNTER — Encounter (HOSPITAL_COMMUNITY)
Admission: RE | Admit: 2011-05-04 | Discharge: 2011-05-04 | Disposition: A | Discharge status: Home / Self Care | Payer: 59 | Source: Ambulatory Visit | Attending: Cardiology | Admitting: Cardiology

## 2011-05-04 ENCOUNTER — Encounter (HOSPITAL_COMMUNITY): Payer: BC Managed Care – PPO

## 2011-05-05 NOTE — Progress Notes (Signed)
Pt started cardiac rehab today.  Pt tolerated light exercise without difficulty. Telemetry Normal Sinus Rhythm. SAO2 96-97% on Room air.Will continue to monitor the patient throughout  the program.

## 2011-05-06 ENCOUNTER — Encounter (HOSPITAL_COMMUNITY)
Admission: RE | Admit: 2011-05-06 | Discharge: 2011-05-06 | Disposition: A | Discharge status: Home / Self Care | Payer: 59 | Source: Ambulatory Visit | Attending: Cardiology | Admitting: Cardiology

## 2011-05-06 ENCOUNTER — Encounter (HOSPITAL_COMMUNITY): Payer: BC Managed Care – PPO

## 2011-05-07 NOTE — Progress Notes (Signed)
Desiree [name] 78 y.o. female       Nutrition Screen                                                                    YES  NO Do you live in a nursing home?  X   Do you eat out more than 3 times/week?    X If yes, how many times per week do you eat out?  Do you have food allergies?   X If yes, what are you allergic to?  Have you gained or lost more than 10 lbs without trying?               X If yes, how much weight have you lost and over what time period?  lbs gained or lost over  weeks/month  Do you want to lose weight?     X If yes, what is a goal weight or amount of weight you would like to lose?  lb  Do you eat alone most of the time?  X    Do you eat less than 2 meals/day?  X If yes, how many meals do you eat?  Do you drink more than 3 alcohol drinks/day?  X If yes, how many drinks per day?  Are you having trouble with constipation? * X  If yes, what are you doing to help relieve constipation?  Do you have financial difficulties with buying food?*    X   Are you experiencing regular nausea/ vomiting?*     X   Do you have a poor appetite? *                                        X   Do you have trouble chewing/swallowing? *   X    Pt with diagnoses of:  X MVR      X Dyslipidemia  / HDL< 40 / LDL>70 / High TG      X >58 years old  X %  Body fat >goal / Body Mass Index >25 X HTN / BP >120/80 X Pre-diabetes XCHF        Pt Risk Score   6       Diagnosis Risk Score  80       Total Risk Score   86                        X High Risk                Low Risk    HT: 59.5" Ht Readings from Last 1 Encounters:  04/30/11 4' 11.5" (1.511 m)    WT:   126.9 lb (57.7 kg) Wt Readings from Last 3 Encounters:  04/30/11 127 lb 3.3 oz (57.7 kg)  04/20/11 124 lb (56.246 kg)  04/06/11 124 lb (56.246 kg)     IBW 44.3 130%IBW BMI 25.3 37.5%body fat  Meds reviewed: Coumadin, Miralax, Potassium Chloride Past Medical History  Diagnosis Date  . Diverticulosis     chronic issues with  consitpation and diarrhea  . Carotid artery disease   .  Hypercholesterolemia   . Osteoarthritis     chronic-on multiple pain meds.  . Paroxysmal atrial fibrillation     not on coumadin-Cards=[facility]-saw him ?1 yr ago  . CHF (congestive heart failure), NYHA class II     has sudden onset decom CHF? 2/2 to PNA  . Lumbar disc disease   . Hypertensive heart disease without CHF   . Lumbar disc disease   . Heart murmur   . Pneumonia     few times  . Hypothyroidism   . Anemia   . Hypertension   . Mitral regurgitation   . Urinary tract infection     hx of UTI        Activity level: Pt is active    Wt goal: 127 lb ( 57.7 kg) Current tobacco use? No      Food/Drug Interaction? Yes If Y, which med(s)? Coumadin If yes, pt given Food/Drug Interaction handout? Yes  Labs:  Lipid Panel     Component Value Date/Time   CHOL 116 01/11/2011 0335   TRIG 57 01/11/2011 0335   HDL 39* 01/11/2011 0335   CHOLHDL 3.0 01/11/2011 0335   VLDL 11 01/11/2011 0335   LDLCALC 66 01/11/2011 0335    Lab Results  Component Value Date   HGBA1C 5.7* 01/19/2011   02/09/11 Glucose 117  LDL goal: < 70      Carotid, PVD and > 2:      HTN, HDL, family h/o, >75 yo female Estimated Daily Nutrition Needs for: ? wt  maintenance 1400-1600 Kcal , Total Fat 45-50gm, Saturated Fat 10-12 gm, Trans Fat 1.5-1.8 gm,  Sodium less than 1500 mg

## 2011-05-08 ENCOUNTER — Encounter (HOSPITAL_COMMUNITY): Payer: BC Managed Care – PPO

## 2011-05-08 ENCOUNTER — Encounter (HOSPITAL_COMMUNITY)
Admission: RE | Admit: 2011-05-08 | Discharge: 2011-05-08 | Disposition: A | Discharge status: Home / Self Care | Payer: 59 | Source: Ambulatory Visit | Attending: Cardiology | Admitting: Cardiology

## 2011-05-11 ENCOUNTER — Encounter (HOSPITAL_COMMUNITY): Payer: BC Managed Care – PPO

## 2011-05-11 ENCOUNTER — Encounter (HOSPITAL_COMMUNITY)
Admission: RE | Admit: 2011-05-11 | Discharge: 2011-05-11 | Disposition: A | Discharge status: Home / Self Care | Payer: 59 | Source: Ambulatory Visit | Attending: Cardiology | Admitting: Cardiology

## 2011-05-13 ENCOUNTER — Encounter (HOSPITAL_COMMUNITY): Payer: BC Managed Care – PPO

## 2011-05-13 ENCOUNTER — Encounter (HOSPITAL_COMMUNITY)
Admission: RE | Admit: 2011-05-13 | Discharge: 2011-05-13 | Disposition: A | Discharge status: Home / Self Care | Payer: 59 | Source: Ambulatory Visit | Attending: Cardiology | Admitting: Cardiology

## 2011-05-13 NOTE — Progress Notes (Signed)
Reviewed home exercise with pt today.  Pt plans to walk at home for exercise.  Reviewed THR, pulse, RPE, sign and symptoms, NTG use, and when to call 911 or MD.  Pt voiced understanding. Electronically signed by Harriett Sine MS on Wednesday May 13 2011 at 1613 Assisted by Ashok Pall, academic intern

## 2011-05-15 ENCOUNTER — Encounter (HOSPITAL_COMMUNITY)
Admission: RE | Admit: 2011-05-15 | Discharge: 2011-05-15 | Disposition: A | Discharge status: Home / Self Care | Payer: 59 | Source: Ambulatory Visit | Attending: Cardiology | Admitting: Cardiology

## 2011-05-15 ENCOUNTER — Encounter (HOSPITAL_COMMUNITY): Payer: BC Managed Care – PPO

## 2011-05-18 ENCOUNTER — Encounter (HOSPITAL_COMMUNITY): Payer: BC Managed Care – PPO

## 2011-05-18 ENCOUNTER — Encounter (HOSPITAL_COMMUNITY)
Admission: RE | Admit: 2011-05-18 | Discharge: 2011-05-18 | Disposition: A | Discharge status: Home / Self Care | Payer: 59 | Source: Ambulatory Visit | Attending: Cardiology | Admitting: Cardiology

## 2011-05-20 ENCOUNTER — Encounter (HOSPITAL_COMMUNITY)
Admission: RE | Admit: 2011-05-20 | Discharge: 2011-05-20 | Disposition: A | Discharge status: Home / Self Care | Payer: 59 | Source: Ambulatory Visit | Attending: Cardiology | Admitting: Cardiology

## 2011-05-20 ENCOUNTER — Encounter (HOSPITAL_COMMUNITY): Payer: BC Managed Care – PPO

## 2011-05-22 ENCOUNTER — Encounter (HOSPITAL_COMMUNITY): Payer: BC Managed Care – PPO

## 2011-05-22 ENCOUNTER — Encounter (HOSPITAL_COMMUNITY): Payer: 59

## 2011-05-25 ENCOUNTER — Encounter (HOSPITAL_COMMUNITY): Payer: BC Managed Care – PPO

## 2011-05-27 ENCOUNTER — Encounter (HOSPITAL_COMMUNITY): Payer: BC Managed Care – PPO

## 2011-05-29 ENCOUNTER — Encounter (HOSPITAL_COMMUNITY): Payer: BC Managed Care – PPO

## 2011-05-29 ENCOUNTER — Encounter (HOSPITAL_COMMUNITY)
Admission: RE | Admit: 2011-05-29 | Discharge: 2011-05-29 | Disposition: A | Discharge status: Home / Self Care | Payer: BC Managed Care – PPO | Source: Ambulatory Visit | Attending: Cardiology | Admitting: Cardiology

## 2011-05-29 DIAGNOSIS — I059 Rheumatic mitral valve disease, unspecified: Secondary | ICD-10-CM | POA: Insufficient documentation

## 2011-05-29 DIAGNOSIS — Z5189 Encounter for other specified aftercare: Secondary | ICD-10-CM | POA: Insufficient documentation

## 2011-05-29 DIAGNOSIS — E785 Hyperlipidemia, unspecified: Secondary | ICD-10-CM | POA: Insufficient documentation

## 2011-05-29 DIAGNOSIS — I4891 Unspecified atrial fibrillation: Secondary | ICD-10-CM | POA: Insufficient documentation

## 2011-05-29 DIAGNOSIS — R011 Cardiac murmur, unspecified: Secondary | ICD-10-CM | POA: Insufficient documentation

## 2011-05-29 DIAGNOSIS — E039 Hypothyroidism, unspecified: Secondary | ICD-10-CM | POA: Insufficient documentation

## 2011-05-29 DIAGNOSIS — I779 Disorder of arteries and arterioles, unspecified: Secondary | ICD-10-CM | POA: Insufficient documentation

## 2011-05-29 DIAGNOSIS — I509 Heart failure, unspecified: Secondary | ICD-10-CM | POA: Insufficient documentation

## 2011-05-29 DIAGNOSIS — Z9889 Other specified postprocedural states: Secondary | ICD-10-CM | POA: Insufficient documentation

## 2011-05-29 DIAGNOSIS — E7800 Pure hypercholesterolemia, unspecified: Secondary | ICD-10-CM | POA: Insufficient documentation

## 2011-05-29 DIAGNOSIS — I119 Hypertensive heart disease without heart failure: Secondary | ICD-10-CM | POA: Insufficient documentation

## 2011-06-01 ENCOUNTER — Encounter (HOSPITAL_COMMUNITY): Payer: BC Managed Care – PPO

## 2011-06-01 ENCOUNTER — Encounter (HOSPITAL_COMMUNITY)
Admission: RE | Admit: 2011-06-01 | Discharge: 2011-06-01 | Disposition: A | Discharge status: Home / Self Care | Payer: BC Managed Care – PPO | Source: Ambulatory Visit | Attending: Cardiology | Admitting: Cardiology

## 2011-06-01 NOTE — Progress Notes (Signed)
Desiree [name] 78 y.o. female Nutrition Note Spoke with pt.  Nutrition Plan and Nutrition Survey reviewed with pt. Pt is following Step 2 of the Therapeutic Lifestyle Changes diet. Pt's A1c/ pre-diabetes discussed. Low-sodium diet recommendations reviewed. Pt knowledgeable re: foods containing vitamin K. Consistent vitamin K intake discussed. Pt expressed understanding of the above.  Nutrition Diagnosis   Food-and nutrition-related knowledge deficit related to lack of exposure to information as related to diagnosis of: ? CVD   Nutrition RX/ Estimated Daily Nutrition Needs for: wt  maintenance 1400-1600 Kcal, 45-50 gm fat, 10-12 gm sat fat, 1.5-1.8 gm trans-fat, <1500 mg sodium Nutrition Intervention   Pt's individual nutrition plan including cholesterol goals reviewed with pt.   Benefits of adopting Therapeutic Lifestyle Changes discussed when Medficts reviewed.   Pt to attend the Portion Distortion class   Pt to attend the  ? Nutrition I class                     ? Nutrition II class   Pt given handouts for: ? pre-DM ? Consistent vit K diet ? low sodium   Continue client-centered nutrition education by RD, as part of interdisciplinary care. Goal(s)   Pt to describe the benefit of including fruits, vegetables, whole grains, and low-fat dairy products in a heart healthy meal plan. Monitor and Evaluate progress toward nutrition goal with team.  Pt Knowledgeable NW:GNFA change to moderate nutrition risk.

## 2011-06-03 ENCOUNTER — Encounter (HOSPITAL_COMMUNITY): Payer: BC Managed Care – PPO

## 2011-06-03 ENCOUNTER — Encounter (HOSPITAL_COMMUNITY)
Admission: RE | Admit: 2011-06-03 | Discharge: 2011-06-03 | Disposition: A | Discharge status: Home / Self Care | Payer: BC Managed Care – PPO | Source: Ambulatory Visit | Attending: Cardiology | Admitting: Cardiology

## 2011-06-05 ENCOUNTER — Encounter (HOSPITAL_COMMUNITY): Payer: BC Managed Care – PPO

## 2011-06-05 ENCOUNTER — Encounter (HOSPITAL_COMMUNITY)
Admission: RE | Admit: 2011-06-05 | Discharge: 2011-06-05 | Disposition: A | Discharge status: Home / Self Care | Payer: BC Managed Care – PPO | Source: Ambulatory Visit | Attending: Cardiology | Admitting: Cardiology

## 2011-06-08 ENCOUNTER — Encounter (HOSPITAL_COMMUNITY)
Admission: RE | Admit: 2011-06-08 | Discharge: 2011-06-08 | Disposition: A | Discharge status: Home / Self Care | Payer: BC Managed Care – PPO | Source: Ambulatory Visit | Attending: Cardiology | Admitting: Cardiology

## 2011-06-08 ENCOUNTER — Encounter (HOSPITAL_COMMUNITY): Payer: BC Managed Care – PPO

## 2011-06-10 ENCOUNTER — Encounter (HOSPITAL_COMMUNITY): Payer: BC Managed Care – PPO

## 2011-06-12 ENCOUNTER — Encounter (HOSPITAL_COMMUNITY)
Admission: RE | Admit: 2011-06-12 | Discharge: 2011-06-12 | Disposition: A | Discharge status: Home / Self Care | Payer: BC Managed Care – PPO | Source: Ambulatory Visit | Attending: Cardiology | Admitting: Cardiology

## 2011-06-12 ENCOUNTER — Encounter (HOSPITAL_COMMUNITY): Payer: BC Managed Care – PPO

## 2011-06-15 ENCOUNTER — Encounter (HOSPITAL_COMMUNITY): Payer: BC Managed Care – PPO

## 2011-06-15 ENCOUNTER — Encounter (HOSPITAL_COMMUNITY)
Admission: RE | Admit: 2011-06-15 | Discharge: 2011-06-15 | Disposition: A | Discharge status: Home / Self Care | Payer: BC Managed Care – PPO | Source: Ambulatory Visit | Attending: Cardiology | Admitting: Cardiology

## 2011-06-17 ENCOUNTER — Encounter (HOSPITAL_COMMUNITY)
Admission: RE | Admit: 2011-06-17 | Discharge: 2011-06-17 | Disposition: A | Discharge status: Home / Self Care | Payer: BC Managed Care – PPO | Source: Ambulatory Visit | Attending: Cardiology | Admitting: Cardiology

## 2011-06-17 ENCOUNTER — Encounter (HOSPITAL_COMMUNITY): Payer: BC Managed Care – PPO

## 2011-06-19 ENCOUNTER — Encounter (HOSPITAL_COMMUNITY): Payer: BC Managed Care – PPO

## 2011-06-19 ENCOUNTER — Encounter (HOSPITAL_COMMUNITY)
Admission: RE | Admit: 2011-06-19 | Discharge: 2011-06-19 | Disposition: A | Discharge status: Home / Self Care | Payer: BC Managed Care – PPO | Source: Ambulatory Visit | Attending: Cardiology | Admitting: Cardiology

## 2011-06-19 NOTE — Progress Notes (Signed)
Pt arrived at cardiac rehab reporting she had fallen in yard at home. Pt states she was treated and released by PCP and sustained no damaging injuries.  Pt was shaken up by the fall.  Pt reports she does not currently have life alert at home even though she lives at home alone. Pt advised would  Be appropriate to get life alert system.  Pt states she would like to look into that possibility for safety and security.

## 2011-06-22 ENCOUNTER — Encounter (HOSPITAL_COMMUNITY): Payer: BC Managed Care – PPO

## 2011-06-22 ENCOUNTER — Encounter (HOSPITAL_COMMUNITY)
Admission: RE | Admit: 2011-06-22 | Discharge: 2011-06-22 | Disposition: A | Discharge status: Home / Self Care | Payer: BC Managed Care – PPO | Source: Ambulatory Visit | Attending: Cardiology | Admitting: Cardiology

## 2011-06-24 ENCOUNTER — Encounter (HOSPITAL_COMMUNITY): Payer: BC Managed Care – PPO

## 2011-06-24 ENCOUNTER — Encounter (HOSPITAL_COMMUNITY)
Admission: RE | Admit: 2011-06-24 | Discharge: 2011-06-24 | Disposition: A | Discharge status: Home / Self Care | Payer: BC Managed Care – PPO | Source: Ambulatory Visit | Attending: Cardiology | Admitting: Cardiology

## 2011-06-24 DIAGNOSIS — I509 Heart failure, unspecified: Secondary | ICD-10-CM | POA: Insufficient documentation

## 2011-06-24 DIAGNOSIS — Z9889 Other specified postprocedural states: Secondary | ICD-10-CM | POA: Insufficient documentation

## 2011-06-24 DIAGNOSIS — E039 Hypothyroidism, unspecified: Secondary | ICD-10-CM | POA: Insufficient documentation

## 2011-06-24 DIAGNOSIS — E785 Hyperlipidemia, unspecified: Secondary | ICD-10-CM | POA: Insufficient documentation

## 2011-06-24 DIAGNOSIS — Z5189 Encounter for other specified aftercare: Secondary | ICD-10-CM | POA: Insufficient documentation

## 2011-06-24 DIAGNOSIS — E7800 Pure hypercholesterolemia, unspecified: Secondary | ICD-10-CM | POA: Insufficient documentation

## 2011-06-24 DIAGNOSIS — I119 Hypertensive heart disease without heart failure: Secondary | ICD-10-CM | POA: Insufficient documentation

## 2011-06-24 DIAGNOSIS — I779 Disorder of arteries and arterioles, unspecified: Secondary | ICD-10-CM | POA: Insufficient documentation

## 2011-06-24 DIAGNOSIS — R011 Cardiac murmur, unspecified: Secondary | ICD-10-CM | POA: Insufficient documentation

## 2011-06-24 DIAGNOSIS — I4891 Unspecified atrial fibrillation: Secondary | ICD-10-CM | POA: Insufficient documentation

## 2011-06-24 DIAGNOSIS — I059 Rheumatic mitral valve disease, unspecified: Secondary | ICD-10-CM | POA: Insufficient documentation

## 2011-06-26 ENCOUNTER — Encounter (HOSPITAL_COMMUNITY): Payer: BC Managed Care – PPO

## 2011-06-26 ENCOUNTER — Encounter (HOSPITAL_COMMUNITY)
Admission: RE | Admit: 2011-06-26 | Discharge: 2011-06-26 | Disposition: A | Discharge status: Home / Self Care | Payer: BC Managed Care – PPO | Source: Ambulatory Visit | Attending: Cardiology | Admitting: Cardiology

## 2011-06-29 ENCOUNTER — Encounter (HOSPITAL_COMMUNITY): Payer: BC Managed Care – PPO

## 2011-06-29 ENCOUNTER — Encounter (HOSPITAL_COMMUNITY)
Admission: RE | Admit: 2011-06-29 | Discharge: 2011-06-29 | Disposition: A | Discharge status: Home / Self Care | Payer: BC Managed Care – PPO | Source: Ambulatory Visit | Attending: Cardiology | Admitting: Cardiology

## 2011-07-01 ENCOUNTER — Encounter (HOSPITAL_COMMUNITY)
Admission: RE | Admit: 2011-07-01 | Discharge: 2011-07-01 | Disposition: A | Discharge status: Home / Self Care | Payer: BC Managed Care – PPO | Source: Ambulatory Visit | Attending: Cardiology | Admitting: Cardiology

## 2011-07-01 ENCOUNTER — Encounter (HOSPITAL_COMMUNITY): Payer: BC Managed Care – PPO

## 2011-07-03 ENCOUNTER — Encounter (HOSPITAL_COMMUNITY): Payer: BC Managed Care – PPO

## 2011-07-03 ENCOUNTER — Encounter (HOSPITAL_COMMUNITY)
Admission: RE | Admit: 2011-07-03 | Discharge: 2011-07-03 | Disposition: A | Discharge status: Home / Self Care | Payer: BC Managed Care – PPO | Source: Ambulatory Visit | Attending: Cardiology | Admitting: Cardiology

## 2011-07-06 ENCOUNTER — Encounter (HOSPITAL_COMMUNITY): Payer: BC Managed Care – PPO

## 2011-07-06 ENCOUNTER — Encounter (HOSPITAL_COMMUNITY)
Admission: RE | Admit: 2011-07-06 | Discharge: 2011-07-06 | Disposition: A | Discharge status: Home / Self Care | Payer: BC Managed Care – PPO | Source: Ambulatory Visit | Attending: Cardiology | Admitting: Cardiology

## 2011-07-08 ENCOUNTER — Encounter (HOSPITAL_COMMUNITY): Payer: BC Managed Care – PPO

## 2011-07-08 ENCOUNTER — Encounter (HOSPITAL_COMMUNITY)
Admission: RE | Admit: 2011-07-08 | Discharge: 2011-07-08 | Disposition: A | Discharge status: Home / Self Care | Payer: BC Managed Care – PPO | Source: Ambulatory Visit | Attending: Cardiology | Admitting: Cardiology

## 2011-07-10 ENCOUNTER — Encounter (HOSPITAL_COMMUNITY): Payer: BC Managed Care – PPO

## 2011-07-10 ENCOUNTER — Encounter (HOSPITAL_COMMUNITY)
Admission: RE | Admit: 2011-07-10 | Discharge: 2011-07-10 | Disposition: A | Discharge status: Home / Self Care | Payer: BC Managed Care – PPO | Source: Ambulatory Visit | Attending: Cardiology | Admitting: Cardiology

## 2011-07-13 ENCOUNTER — Encounter (HOSPITAL_COMMUNITY)
Admission: RE | Admit: 2011-07-13 | Discharge: 2011-07-13 | Disposition: A | Discharge status: Home / Self Care | Payer: BC Managed Care – PPO | Source: Ambulatory Visit | Attending: Cardiology | Admitting: Cardiology

## 2011-07-13 ENCOUNTER — Encounter (HOSPITAL_COMMUNITY): Payer: BC Managed Care – PPO

## 2011-07-15 ENCOUNTER — Encounter (HOSPITAL_COMMUNITY): Payer: BC Managed Care – PPO

## 2011-07-15 ENCOUNTER — Encounter (HOSPITAL_COMMUNITY)
Admission: RE | Admit: 2011-07-15 | Discharge: 2011-07-15 | Disposition: A | Discharge status: Home / Self Care | Payer: BC Managed Care – PPO | Source: Ambulatory Visit | Attending: Cardiology | Admitting: Cardiology

## 2011-07-17 ENCOUNTER — Encounter (HOSPITAL_COMMUNITY): Payer: BC Managed Care – PPO

## 2011-07-17 ENCOUNTER — Encounter (HOSPITAL_COMMUNITY)
Admission: RE | Admit: 2011-07-17 | Discharge: 2011-07-17 | Disposition: A | Discharge status: Home / Self Care | Payer: BC Managed Care – PPO | Source: Ambulatory Visit | Attending: Cardiology | Admitting: Cardiology

## 2011-07-20 ENCOUNTER — Encounter (HOSPITAL_COMMUNITY): Payer: BC Managed Care – PPO

## 2011-07-22 ENCOUNTER — Encounter (HOSPITAL_COMMUNITY): Payer: BC Managed Care – PPO

## 2011-07-22 ENCOUNTER — Encounter (HOSPITAL_COMMUNITY)
Admission: RE | Admit: 2011-07-22 | Discharge: 2011-07-22 | Disposition: A | Discharge status: Home / Self Care | Payer: BC Managed Care – PPO | Source: Ambulatory Visit | Attending: Cardiology | Admitting: Cardiology

## 2011-07-24 ENCOUNTER — Encounter (HOSPITAL_COMMUNITY)
Admission: RE | Admit: 2011-07-24 | Discharge: 2011-07-24 | Disposition: A | Discharge status: Home / Self Care | Payer: BC Managed Care – PPO | Source: Ambulatory Visit | Attending: Cardiology | Admitting: Cardiology

## 2011-07-24 ENCOUNTER — Encounter (HOSPITAL_COMMUNITY): Payer: BC Managed Care – PPO

## 2011-07-27 ENCOUNTER — Encounter (HOSPITAL_COMMUNITY): Payer: Medicare Other

## 2011-07-27 ENCOUNTER — Encounter (HOSPITAL_COMMUNITY)
Admission: RE | Admit: 2011-07-27 | Discharge: 2011-07-27 | Disposition: A | Discharge status: Home / Self Care | Payer: Medicare Other | Source: Ambulatory Visit | Attending: Cardiology | Admitting: Cardiology

## 2011-07-27 DIAGNOSIS — I4891 Unspecified atrial fibrillation: Secondary | ICD-10-CM | POA: Insufficient documentation

## 2011-07-27 DIAGNOSIS — R011 Cardiac murmur, unspecified: Secondary | ICD-10-CM | POA: Insufficient documentation

## 2011-07-27 DIAGNOSIS — I509 Heart failure, unspecified: Secondary | ICD-10-CM | POA: Insufficient documentation

## 2011-07-27 DIAGNOSIS — E039 Hypothyroidism, unspecified: Secondary | ICD-10-CM | POA: Insufficient documentation

## 2011-07-27 DIAGNOSIS — I119 Hypertensive heart disease without heart failure: Secondary | ICD-10-CM | POA: Insufficient documentation

## 2011-07-27 DIAGNOSIS — E7800 Pure hypercholesterolemia, unspecified: Secondary | ICD-10-CM | POA: Insufficient documentation

## 2011-07-27 DIAGNOSIS — I059 Rheumatic mitral valve disease, unspecified: Secondary | ICD-10-CM | POA: Insufficient documentation

## 2011-07-27 DIAGNOSIS — Z9889 Other specified postprocedural states: Secondary | ICD-10-CM | POA: Insufficient documentation

## 2011-07-27 DIAGNOSIS — I779 Disorder of arteries and arterioles, unspecified: Secondary | ICD-10-CM | POA: Insufficient documentation

## 2011-07-27 DIAGNOSIS — Z5189 Encounter for other specified aftercare: Secondary | ICD-10-CM | POA: Insufficient documentation

## 2011-07-27 DIAGNOSIS — E785 Hyperlipidemia, unspecified: Secondary | ICD-10-CM | POA: Insufficient documentation

## 2011-07-29 ENCOUNTER — Encounter (HOSPITAL_COMMUNITY): Payer: Medicare Other

## 2011-07-29 ENCOUNTER — Encounter (HOSPITAL_COMMUNITY)
Admission: RE | Admit: 2011-07-29 | Discharge: 2011-07-29 | Disposition: A | Discharge status: Home / Self Care | Payer: Medicare Other | Source: Ambulatory Visit | Attending: Cardiology | Admitting: Cardiology

## 2011-07-31 ENCOUNTER — Encounter (HOSPITAL_COMMUNITY)
Admission: RE | Admit: 2011-07-31 | Discharge: 2011-07-31 | Disposition: A | Discharge status: Home / Self Care | Payer: Medicare Other | Source: Ambulatory Visit | Attending: Cardiology | Admitting: Cardiology

## 2011-07-31 ENCOUNTER — Encounter (HOSPITAL_COMMUNITY): Payer: Medicare Other

## 2011-08-03 ENCOUNTER — Encounter: Payer: Self-pay | Admitting: Thoracic Surgery (Cardiothoracic Vascular Surgery)

## 2011-08-03 ENCOUNTER — Encounter (HOSPITAL_COMMUNITY): Payer: Medicare Other

## 2011-08-03 ENCOUNTER — Encounter (HOSPITAL_COMMUNITY)
Admission: RE | Admit: 2011-08-03 | Discharge: 2011-08-03 | Disposition: A | Discharge status: Home / Self Care | Payer: Medicare Other | Source: Ambulatory Visit | Attending: Cardiology | Admitting: Cardiology

## 2011-08-03 ENCOUNTER — Ambulatory Visit (INDEPENDENT_AMBULATORY_CARE_PROVIDER_SITE_OTHER): Payer: Medicare Other | Admitting: Thoracic Surgery (Cardiothoracic Vascular Surgery)

## 2011-08-03 VITALS — BP 180/75 | HR 80 | Resp 20 | Ht 59.0 in | Wt 129.0 lb

## 2011-08-03 DIAGNOSIS — I059 Rheumatic mitral valve disease, unspecified: Secondary | ICD-10-CM

## 2011-08-03 DIAGNOSIS — Z9889 Other specified postprocedural states: Secondary | ICD-10-CM | POA: Insufficient documentation

## 2011-08-03 NOTE — Progress Notes (Signed)
                   [address]            [name] [address]          [phone]     CARDIOTHORACIC SURGERY OFFICE NOTE  Referring Provider is Othella Boyer, MD PCP is Ancil Boozer, MD, MD   HPI:  Patient returns for followup now 6 months following mitral valve repair. She was last seen here in the office on 04/20/2011. Since then she has continued to do exceptionally well. She is back at work and she has completed the cardiac rehabilitation program. Her biggest complaint at this point relates to her severe degenerative arthritis, particularly afflicting her knees. She has had some problems maintaining stability of her Coumadin dosing, but she has not had any bleeding complications. Her breathing is quite good and she denies any problems with chest pain, shortness of breath, or palpitations. Overall she is pleased with her progress.   Current Outpatient Prescriptions  Medication Sig Dispense Refill  . atorvastatin (LIPITOR) 80 MG tablet Take 80 mg by mouth at bedtime.       . furosemide (LASIX) 40 MG tablet Take 1 tablet (40 mg total) by mouth daily.  30 tablet    . HYDROcodone-acetaminophen (VICODIN) 5-500 MG per tablet Take 1-2 tablets by mouth every 8 (eight) hours as needed. pain      . levothyroxine (SYNTHROID, LEVOTHROID) 75 MCG tablet Take 75 mcg by mouth daily.       . metoprolol tartrate (LOPRESSOR) 25 MG tablet Take 37.5 mg by mouth 2 (two) times daily.      Bertram Gala Glycol-Propyl Glycol (SYSTANE OP) Apply 2 drops to eye 4 (four) times daily as needed. For dry eyes.      . polyethylene glycol (MIRALAX / GLYCOLAX) packet Take 17 g by mouth daily.       . potassium chloride SA (K-DUR,KLOR-CON) 20 MEQ tablet Take 20 mEq by mouth 2 (two) times daily.      Marland Kitchen warfarin (COUMADIN) 2 MG tablet Take 2-3 mg by mouth daily at 6 PM. On Monday, Wed, and Friday 3 mg , 2 mg all other days.          Physical Exam:   BP 180/75  Pulse 80  Resp 20  Ht 4\' 11"  (1.499  m)  Wt 129 lb (58.514 kg)  BMI 26.05 kg/m2  SpO2 98%  General:  Well-appearing  Chest:   Clear to auscultation  CV:   Regular rate and rhythm without murmur  Incisions:  Completely healed  Abdomen:  Soft and nontender  Extremities:  Warm and well-perfused with no lower extremity edema  Diagnostic Tests:  n/a   Impression:  Patient is doing well 6 months following mitral valve repair.  She appears to be maintaining sinus rhythm and therefore potentially could be taken off of Coumadin.   Plan:  In the future the patient will call and return to see [place] as needed.     Salvatore Decent. Cornelius Moras, MD 08/03/2011 1:26 PM

## 2011-08-05 ENCOUNTER — Encounter (HOSPITAL_COMMUNITY): Payer: Medicare Other

## 2011-08-05 ENCOUNTER — Encounter (HOSPITAL_COMMUNITY)
Admission: RE | Admit: 2011-08-05 | Discharge: 2011-08-05 | Disposition: A | Discharge status: Home / Self Care | Payer: Medicare Other | Source: Ambulatory Visit | Attending: Cardiology | Admitting: Cardiology

## 2011-08-07 ENCOUNTER — Encounter (HOSPITAL_COMMUNITY): Payer: Medicare Other

## 2011-08-25 NOTE — Progress Notes (Signed)
Agree with progress report from cardiac rehab on 08/06/2011 written by Trevor Mace and and Heloise Purpura.

## 2011-09-24 HISTORY — PX: FRACTURE SURGERY: SHX138

## 2011-12-21 ENCOUNTER — Encounter: Payer: Self-pay | Admitting: Vascular Surgery

## 2011-12-22 ENCOUNTER — Ambulatory Visit (INDEPENDENT_AMBULATORY_CARE_PROVIDER_SITE_OTHER): Payer: Medicare Other | Admitting: Neurosurgery

## 2011-12-22 ENCOUNTER — Encounter: Payer: Self-pay | Admitting: Neurosurgery

## 2011-12-22 ENCOUNTER — Other Ambulatory Visit (INDEPENDENT_AMBULATORY_CARE_PROVIDER_SITE_OTHER): Payer: Medicare Other | Admitting: *Deleted

## 2011-12-22 ENCOUNTER — Ambulatory Visit: Payer: 59 | Admitting: Vascular Surgery

## 2011-12-22 VITALS — BP 144/62 | HR 72 | Resp 17 | Ht 59.0 in | Wt 128.0 lb

## 2011-12-22 DIAGNOSIS — I6529 Occlusion and stenosis of unspecified carotid artery: Secondary | ICD-10-CM

## 2011-12-22 DIAGNOSIS — Z48812 Encounter for surgical aftercare following surgery on the circulatory system: Secondary | ICD-10-CM

## 2011-12-22 NOTE — Progress Notes (Signed)
[facility] [place] Carotid Office Note  CC: Carotid surveillance Referring Physician: Early  History of Present Illness: 78 year old female patient of Dr. Arbie Cookey who status post right CEA with resection of redundant CCA in August 2005. The patient denies any signs or symptoms of CVA, TIA, amaurosis fugax or any neural deficit. The patient states she recently underwent open heart surgery and had many complications but appears to be doing well now.  Past Medical History  Diagnosis Date  . Diverticulosis     chronic issues with consitpation and diarrhea  . Carotid artery disease   . Hypercholesterolemia   . Osteoarthritis     chronic-on multiple pain meds.  . Paroxysmal atrial fibrillation     not on coumadin-Cards=T[facility]ley-saw him ?1 yr ago  . CHF (congestive heart failure), NYHA class II     has sudden onset decom CHF? 2/2 to PNA  . Lumbar disc disease   . Hypertensive heart disease without CHF   . Lumbar disc disease   . Heart murmur   . Pneumonia     few times  . Hypothyroidism   . Anemia   . Hypertension   . Mitral regurgitation   . Urinary tract infection     hx of UTI    ROS: [x]  Positive   [ ]  Denies    General: [ ]  Weight loss, [ ]  Fever, [ ]  chills Neurologic: [ ]  Dizziness, [ ]  Blackouts, [ ]  Seizure [ ]  Stroke, [ ]  "Mini stroke", [ ]  Slurred speech, [ ]  Temporary blindness; [ ]  weakness in arms or legs, [ ]  Hoarseness Cardiac: [ ]  Chest pain/pressure, [ ]  Shortness of breath at rest [ ]  Shortness of breath with exertion, [ ]  Atrial fibrillation or irregular heartbeat Vascular: [ ]  Pain in legs with walking, [ ]  Pain in legs at rest, [ ]  Pain in legs at night,  [ ]  Non-healing ulcer, [ ]  Blood clot in vein/DVT,   Pulmonary: [ ]  Home oxygen, [ ]  Productive cough, [ ]  Coughing up blood, [ ]  Asthma,  [ ]  Wheezing Musculoskeletal:  [ ]  Arthritis, [ ]  Low back pain, [ ]  Joint pain Hematologic: [ ]  Easy Bruising, [ ]  Anemia; [ ]   Hepatitis Gastrointestinal: [ ]  Blood in stool, [ ]  Gastroesophageal Reflux/heartburn, [ ]  Trouble swallowing Urinary: [ ]  chronic Kidney disease, [ ]  on HD - [ ]  MWF or [ ]  TTHS, [ ]  Burning with urination, [ ]  Difficulty urinating Skin: [ ]  Rashes, [ ]  Wounds Psychological: [ ]  Anxiety, [ ]  Depression   Social History History  Substance Use Topics  . Smoking status: Never Smoker   . Smokeless tobacco: Never Used  . Alcohol Use: No    Family History Family History  Problem Relation Age of Onset  . Cancer Mother   . Stroke Father   . Cancer Father     No Known Allergies  Current Outpatient Prescriptions  Medication Sig Dispense Refill  . atorvastatin (LIPITOR) 80 MG tablet Take 80 mg by mouth at bedtime.       . furosemide (LASIX) 40 MG tablet Take 1 tablet (40 mg total) by mouth daily.  30 tablet    . HYDROcodone-acetaminophen (VICODIN) 5-500 MG per tablet Take 1-2 tablets by mouth every 8 (eight) hours as needed. pain      . levothyroxine (SYNTHROID, LEVOTHROID) 75 MCG tablet Take 75 mcg by mouth daily.       . metoprolol tartrate (LOPRESSOR) 25 MG  tablet Take 37.5 mg by mouth 2 (two) times daily.      Bertram Gala Glycol-Propyl Glycol (SYSTANE OP) Apply 2 drops to eye 4 (four) times daily as needed. For dry eyes.      . polyethylene glycol (MIRALAX / GLYCOLAX) packet Take 17 g by mouth daily.       . potassium chloride SA (K-DUR,KLOR-CON) 20 MEQ tablet Take 20 mEq by mouth 2 (two) times daily.      Marland Kitchen warfarin (COUMADIN) 2 MG tablet Take 2-3 mg by mouth daily at 6 PM. On Monday, Wed, and Friday 3 mg , 2 mg all other days.        Physical Examination  Filed Vitals:   12/22/11 1050  BP: 144/62  Pulse: 72  Resp:     Body mass index is 25.85 kg/(m^2).  General:  WDWN in NAD Gait: Normal HEENT: WNL Eyes: Pupils equal Pulmonary: normal non-labored breathing , without Rales, rhonchi,  wheezing Cardiac: RRR, without  Murmurs, rubs or gallops; Abdomen: soft, NT, no  masses Skin: no rashes, ulcers noted  Vascular Exam Pulses: 2+ radial pulses bilaterally Carotid bruits: Carotid pulses to auscultation no bruits are heard Extremities without ischemic changes, no Gangrene , no cellulitis; no open wounds;  Musculoskeletal: no muscle wasting or atrophy   Neurologic: A&O X 3; Appropriate Affect ; SENSATION: normal; MOTOR FUNCTION:  moving all extremities equally. Speech is fluent/normal  Non-Invasive Vascular Imaging CAROTID DUPLEX 12/22/2011  Right ICA 0 - 19% stenosis Left ICA 20 - 39 % stenosis   ASSESSMENT/PLAN: Asymptomatic patient with minimal bilateral carotid stenosis. The patient will followup in one year with repeat carotid duplex. The patient's questions were encouraged and answered, she is in agreement with this plan.  Lauree Chandler ANP   Clinic MD: Early

## 2011-12-23 NOTE — Addendum Note (Signed)
Addended by: Melodye Ped C on: 12/23/2011 12:58 PM   Modules accepted: Orders

## 2012-12-13 ENCOUNTER — Other Ambulatory Visit: Payer: Self-pay | Admitting: Vascular Surgery

## 2012-12-13 DIAGNOSIS — I6529 Occlusion and stenosis of unspecified carotid artery: Secondary | ICD-10-CM

## 2012-12-19 ENCOUNTER — Encounter: Payer: Self-pay | Admitting: Family

## 2012-12-20 ENCOUNTER — Ambulatory Visit (HOSPITAL_COMMUNITY)
Admission: RE | Admit: 2012-12-20 | Discharge: 2012-12-20 | Disposition: A | Discharge status: Home / Self Care | Payer: Medicare Other | Source: Ambulatory Visit | Attending: Family | Admitting: Family

## 2012-12-20 ENCOUNTER — Encounter: Payer: Self-pay | Admitting: Family

## 2012-12-20 ENCOUNTER — Ambulatory Visit (INDEPENDENT_AMBULATORY_CARE_PROVIDER_SITE_OTHER): Payer: Medicare Other | Admitting: Family

## 2012-12-20 DIAGNOSIS — I6529 Occlusion and stenosis of unspecified carotid artery: Secondary | ICD-10-CM

## 2012-12-20 DIAGNOSIS — H5789 Other specified disorders of eye and adnexa: Secondary | ICD-10-CM

## 2012-12-20 DIAGNOSIS — Z48812 Encounter for surgical aftercare following surgery on the circulatory system: Secondary | ICD-10-CM | POA: Insufficient documentation

## 2012-12-20 NOTE — Progress Notes (Signed)
Established Carotid Patient  History of Present Illness  Desiree [name] is a 79 y.o. female patient of Dr. Arbie Cookey who status post right CEA with resection of redundant CCA in August 2005.   Patient has Negative history of TIA or stroke symptom.  The patient denies amaurosis fugax or monocular blindness.  The patient  denies facial drooping.  Pt. denies hemiplegia.  The patient denies receptive or expressive aphasia.  Pt. denies extremity weakness. She had a mitral valve repair 2 years ago, cardiologist is Dr. [name]. Patient denies New Medical or Surgical History. Is having severe leg cramps at night, states her potassium check was normal. Walks her dog twice daily. Has severe arthritis in her spine. Her left eye is red since she woke up this AM, states she sill see her ophthalmologist today, denies sudden change in vision today.  Pt Diabetic: No Pt smoker: non-smoker  Pt meds include: Statin : Yes ASA: No Other anticoagulants/antiplatelets: coumadin, taking since mitral valve repair   Past Medical History  Diagnosis Date  . Diverticulosis     chronic issues with consitpation and diarrhea  . Carotid artery disease   . Hypercholesterolemia   . Osteoarthritis     chronic-on multiple pain meds.  . Paroxysmal atrial fibrillation     not on coumadin-Cards=[facility]-saw him ?1 yr ago  . CHF (congestive heart failure), NYHA class II     has sudden onset decom CHF? 2/2 to PNA  . Lumbar disc disease   . Hypertensive heart disease without CHF   . Lumbar disc disease   . Heart murmur   . Pneumonia     few times  . Hypothyroidism   . Anemia   . Hypertension   . Mitral regurgitation   . Urinary tract infection     hx of UTI    Social History History  Substance Use Topics  . Smoking status: Never Smoker   . Smokeless tobacco: Never Used  . Alcohol Use: No    Family History Family History  Problem Relation Age of Onset  . Cancer Mother   . Stroke Father   . Cancer Father      Surgical History Past Surgical History  Procedure Laterality Date  . Carpal tunnel release      rt  . Rotator cuff repair      2 on right, 1 on left  . Abdominal hysterectomy    . Cataract extraction      bil  . Knee surgery      bilateral arthroscopy  . Tee without cardioversion  01/13/2011    Procedure: TRANSESOPHAGEAL ECHOCARDIOGRAM (TEE);  Surgeon: Wendall Stade, MD;  Location: [facility] ENDOSCOPY;  Service: Cardiovascular;  Laterality: Left;  . Cardiac catheterization  01/14/11  . Carotid endarterectomy      rt  . Mitral valve repair  01/21/2011    Procedure: MITRAL VALVE REPAIR (MVR);  Surgeon: Purcell Nails, MD;  Location: [facility] OR;  Service: Open Heart Surgery;  Laterality: N/A;  . Chest tube insertion  02/05/2011    Procedure: INSERTION PLEURAL DRAINAGE CATHETER;  Surgeon: Purcell Nails, MD;  Location: [facility] OR;  Service: Thoracic;  Laterality: Right;  . Removal of pleural drainage catheter  04/09/2011    Procedure: REMOVAL OF PLEURAL DRAINAGE CATHETER;  Surgeon: Purcell Nails, MD;  Location: [facility] OR;  Service: Thoracic;  Laterality: Right;  . Talc pleurodesis  04/09/2011    Procedure: Lurlean Nanny;  Surgeon: Purcell Nails, MD;  Location: [facility]  OR;  Service: Thoracic;  Laterality: Right;  . Fracture surgery  Aug. 2013    Right wrist     No Known Allergies  Current Outpatient Prescriptions  Medication Sig Dispense Refill  . atorvastatin (LIPITOR) 80 MG tablet Take 80 mg by mouth at bedtime.       . cefUROXime (CEFTIN) 250 MG tablet       . HYDROcodone-acetaminophen (VICODIN) 5-500 MG per tablet Take 1-2 tablets by mouth every 8 (eight) hours as needed. pain      . levothyroxine (SYNTHROID, LEVOTHROID) 75 MCG tablet Take 75 mcg by mouth daily.       . metoprolol tartrate (LOPRESSOR) 25 MG tablet Take 37.5 mg by mouth 2 (two) times daily.      Bertram Gala Glycol-Propyl Glycol (SYSTANE OP) Apply 2 drops to eye 4 (four) times daily as needed. For dry eyes.      .  polyethylene glycol (MIRALAX / GLYCOLAX) packet Take 17 g by mouth daily.       . potassium chloride SA (K-DUR,KLOR-CON) 20 MEQ tablet Take 20 mEq by mouth 2 (two) times daily.      . furosemide (LASIX) 40 MG tablet Take 1 tablet (40 mg total) by mouth daily.  30 tablet    . warfarin (COUMADIN) 2 MG tablet Take 2-3 mg by mouth daily at 6 PM. On Monday, Wed, and Friday 3 mg , 2 mg all other days.       No current facility-administered medications for this visit.    Review of Systems : [x]  Positive   [ ]  Denies  General:[ ]  Weight loss,  [ ]  Weight gain, [ ]  Loss of appetite, [ ]  Fever, [ ]  chills  Neurologic: [ ]  Dizziness, [ ]  Blackouts, [ ]  Headaches, [ ]  Seizure [ ]  Stroke, [ ]  "Mini stroke", [ ]  Slurred speech, [ ]  Temporary blindness;  [ ] weakness,  Ear/Nose/Throat: [ ]  Change in hearing, [ ]  Nose bleeds, [ ]  Hoarseness  Vascular:[ ]  Pain in legs with walking, [ ]  Pain in feet while lying flat , [ ]   Non-healing ulcer, [ ]  Blood clot in vein,    Pulmonary: [ ]  Home oxygen, [ ]   Productive cough, [ ]  Bronchitis, [ ]  Coughing up blood,  [ ]  Asthma, [ ]  Wheezing  Musculoskeletal:  [ ]  Arthritis, [ ]  Joint pain, [ ]  low back pain  Cardiac: [ ]  Chest pain, [ ]  Shortness of breath when lying flat, [ ]  Shortness of breath with exertion, [ ]  Palpitations, [ ]  Heart murmur, [ ]   Atrial fibrillation  Hematologic:[ ]  Easy Bruising, [ ]  Anemia; [ ]  Hepatitis  Psychiatric: [ ]   Depression, [ ]  Anxiety   Gastrointestinal: [ ]  Black stool, [ ]  Blood in stool, [ ]  Peptic ulcer disease,  [ ]  Gastroesophageal Reflux, [ ]  Trouble swallowing, [ ]  Diarrhea, [ ]  Constipation  Urinary: [ ]  chronic Kidney disease, [ ]  on HD, [ ]  Burning with urination, [ ]  Frequent urination, [ ]  Difficulty urinating;   Skin: [ ]  Rashes, [ ]  Wounds    Physical Examination  Filed Vitals:   12/20/12 0946  BP: 143/61  Pulse: 60  Resp:    Filed Weights   12/20/12 0943  Weight: 125 lb (56.7 kg)   Body mass  index is 24.41 kg/(m^2).  General: WDWN female in NAD GAIT: slow and deliberate Eyes: PERRLA Pulmonary:  CTAB, Negative  Rales, Negative rhonchi, & Negative wheezing.  Cardiac: regular  Rhythm ,  No Murmurs detected.  VASCULAR EXAM Carotid Bruits Left Right   Positive Negative    Aorta is not palpable. Radial pulses are 2+ palpable and  equal.                                                                                                                            LE Pulses LEFT RIGHT       POPLITEAL  not palpable   not palpable       POSTERIOR TIBIAL  not palpable   not palpable        DORSALIS PEDIS      ANTERIOR TIBIAL faintly palpable  not palpable     Gastrointestinal: soft, nontender, BS WNL, no r/g,  negative masses.  Musculoskeletal: Negative muscle atrophy/wasting. M/S 3/5 throughout, Extremities without ischemic changes.  Neurologic: A&O X 3; Appropriate Affect ; SENSATION ;normal;  Speech is normal CN 2-12 intact, Pain and light touch intact in extremities, Motor exam as listed above.   Non-Invasive Vascular Imaging CAROTID DUPLEX 12/20/2012   Right ICA: patent CEA site. Left ICA: <40% stenosis.  These findings are Unchanged from previous exam.  Assessment: Desiree [name] is a 79 y.o. female who presents with asymptomatic patent right ICA (CEA site) and <40% stenosis in left ICA. The  ICA stenosis is  Unchanged from previous exam. Brachial pressures are equivalent.  Plan: Follow-up in 1 year with Carotid Duplex scan.   I discussed in depth with the patient the nature of atherosclerosis, and emphasized the importance of maximal medical management including strict control of blood pressure, blood glucose, and lipid levels, obtaining regular exercise, and continued cessation of smoking.  The patient is aware that without maximal medical management the underlying atherosclerotic disease process will progress, limiting the benefit of any interventions. The  patient was given information about stroke prevention and what symptoms should prompt the patient to seek immediate medical care. Thank you for allowing [place] to participate in this patient's care.  Charisse March, RN, MSN, FNP-C [facility] [place] Office: [phone]  Clinic Physician: Early  12/20/2012 9:37 AM

## 2012-12-20 NOTE — Patient Instructions (Signed)
Stroke Prevention Some medical conditions and behaviors are associated with an increased chance of having a stroke. You may prevent a stroke by making healthy choices and managing medical conditions. Reduce your risk of having a stroke by:  Staying physically active. Get at least 30 minutes of activity on most or all days.  Not smoking. It may also be helpful to avoid exposure to secondhand smoke.  Limiting alcohol use. Moderate alcohol use is considered to be:  No more than 2 drinks per day for men.  No more than 1 drink per day for nonpregnant women.  Eating healthy foods.  Include 5 or more servings of fruits and vegetables a day.  Certain diets may be prescribed to address high blood pressure, high cholesterol, diabetes, or obesity.  Managing your cholesterol levels.  A low-saturated fat, low-trans fat, low-cholesterol, and high-fiber diet may control cholesterol levels.  Take any prescribed medicines to control cholesterol as directed by your caregiver.  Managing your diabetes.  A controlled-carbohydrate, controlled-sugar diet is recommended to manage diabetes.  Take any prescribed medicines to control diabetes as directed by your caregiver.  Controlling your high blood pressure (hypertension).  A low-salt (sodium), low-saturated fat, low-trans fat, and low-cholesterol diet is recommended to manage high blood pressure.  Take any prescribed medicines to control hypertension as directed by your caregiver.  Maintaining a healthy weight.  A reduced-calorie, low-sodium, low-saturated fat, low-trans fat, low-cholesterol diet is recommended to manage weight.  Stopping drug abuse.  Avoiding birth control pills.  Talk to your caregiver about the risks of taking birth control pills if you are over 35 years old, smoke, get migraines, or have ever had a blood clot.  Getting evaluated for sleep disorders (sleep apnea).  Talk to your caregiver about getting a sleep evaluation  if you snore a lot or have excessive sleepiness.  Taking medicines as directed by your caregiver.  For some people, aspirin or blood thinners (anticoagulants) are helpful in reducing the risk of forming abnormal blood clots that can lead to stroke. If you have the irregular heart rhythm of atrial fibrillation, you should be on a blood thinner unless there is a good reason you cannot take them.  Understand all your medicine instructions. SEEK IMMEDIATE MEDICAL CARE IF:   You have sudden weakness or numbness of the face, arm, or leg, especially on one side of the body.  You have sudden confusion.  You have trouble speaking (aphasia) or understanding.  You have sudden trouble seeing in one or both eyes.  You have sudden trouble walking.  You have dizziness.  You have a loss of balance or coordination.  You have a sudden, severe headache with no known cause.  You have new chest pain or an irregular heartbeat. Any of these symptoms may represent a serious problem that is an emergency. Do not wait to see if the symptoms will go away. Get medical help right away. Call your local emergency services (911 in [place] Do not drive yourself to the hospital. Document Released: 03/19/2004 Document Revised: 05/04/2011 Document Reviewed: 09/29/2010 [employer] Patient Information 2014 [employer].  

## 2013-05-29 ENCOUNTER — Emergency Department (HOSPITAL_COMMUNITY): Payer: Medicare Other

## 2013-05-29 ENCOUNTER — Encounter (HOSPITAL_COMMUNITY): Payer: Self-pay | Admitting: Emergency Medicine

## 2013-05-29 ENCOUNTER — Emergency Department (HOSPITAL_COMMUNITY)
Admission: EM | Admit: 2013-05-29 | Discharge: 2013-05-29 | Disposition: A | Discharge status: Home / Self Care | Payer: Medicare Other | Attending: Emergency Medicine | Admitting: Emergency Medicine

## 2013-05-29 DIAGNOSIS — Z862 Personal history of diseases of the blood and blood-forming organs and certain disorders involving the immune mechanism: Secondary | ICD-10-CM | POA: Insufficient documentation

## 2013-05-29 DIAGNOSIS — Y929 Unspecified place or not applicable: Secondary | ICD-10-CM | POA: Insufficient documentation

## 2013-05-29 DIAGNOSIS — Z8701 Personal history of pneumonia (recurrent): Secondary | ICD-10-CM | POA: Insufficient documentation

## 2013-05-29 DIAGNOSIS — E7800 Pure hypercholesterolemia, unspecified: Secondary | ICD-10-CM | POA: Insufficient documentation

## 2013-05-29 DIAGNOSIS — S0003XA Contusion of scalp, initial encounter: Secondary | ICD-10-CM | POA: Insufficient documentation

## 2013-05-29 DIAGNOSIS — Z79899 Other long term (current) drug therapy: Secondary | ICD-10-CM | POA: Insufficient documentation

## 2013-05-29 DIAGNOSIS — I509 Heart failure, unspecified: Secondary | ICD-10-CM | POA: Insufficient documentation

## 2013-05-29 DIAGNOSIS — Z87440 Personal history of urinary (tract) infections: Secondary | ICD-10-CM | POA: Insufficient documentation

## 2013-05-29 DIAGNOSIS — W1809XA Striking against other object with subsequent fall, initial encounter: Secondary | ICD-10-CM | POA: Insufficient documentation

## 2013-05-29 DIAGNOSIS — S0180XA Unspecified open wound of other part of head, initial encounter: Secondary | ICD-10-CM | POA: Insufficient documentation

## 2013-05-29 DIAGNOSIS — I119 Hypertensive heart disease without heart failure: Secondary | ICD-10-CM | POA: Insufficient documentation

## 2013-05-29 DIAGNOSIS — E039 Hypothyroidism, unspecified: Secondary | ICD-10-CM | POA: Insufficient documentation

## 2013-05-29 DIAGNOSIS — S0083XA Contusion of other part of head, initial encounter: Secondary | ICD-10-CM

## 2013-05-29 DIAGNOSIS — W19XXXA Unspecified fall, initial encounter: Secondary | ICD-10-CM

## 2013-05-29 DIAGNOSIS — M1990 Unspecified osteoarthritis, unspecified site: Secondary | ICD-10-CM | POA: Insufficient documentation

## 2013-05-29 DIAGNOSIS — S02401A Maxillary fracture, unspecified, initial encounter for closed fracture: Secondary | ICD-10-CM | POA: Insufficient documentation

## 2013-05-29 DIAGNOSIS — S0280XA Fracture of other specified skull and facial bones, unspecified side, initial encounter for closed fracture: Secondary | ICD-10-CM | POA: Insufficient documentation

## 2013-05-29 DIAGNOSIS — S20219A Contusion of unspecified front wall of thorax, initial encounter: Secondary | ICD-10-CM

## 2013-05-29 DIAGNOSIS — Z23 Encounter for immunization: Secondary | ICD-10-CM | POA: Insufficient documentation

## 2013-05-29 DIAGNOSIS — S0181XA Laceration without foreign body of other part of head, initial encounter: Secondary | ICD-10-CM

## 2013-05-29 DIAGNOSIS — Z9889 Other specified postprocedural states: Secondary | ICD-10-CM | POA: Insufficient documentation

## 2013-05-29 DIAGNOSIS — S1093XA Contusion of unspecified part of neck, initial encounter: Secondary | ICD-10-CM

## 2013-05-29 DIAGNOSIS — S0292XA Unspecified fracture of facial bones, initial encounter for closed fracture: Secondary | ICD-10-CM

## 2013-05-29 DIAGNOSIS — R011 Cardiac murmur, unspecified: Secondary | ICD-10-CM | POA: Insufficient documentation

## 2013-05-29 DIAGNOSIS — Y9301 Activity, walking, marching and hiking: Secondary | ICD-10-CM | POA: Insufficient documentation

## 2013-05-29 DIAGNOSIS — Z8719 Personal history of other diseases of the digestive system: Secondary | ICD-10-CM | POA: Insufficient documentation

## 2013-05-29 DIAGNOSIS — S02400A Malar fracture unspecified, initial encounter for closed fracture: Secondary | ICD-10-CM | POA: Insufficient documentation

## 2013-05-29 LAB — PROTIME-INR
INR: 2.08 — ABNORMAL HIGH (ref 0.00–1.49)
PROTHROMBIN TIME: 22.7 s — AB (ref 11.6–15.2)

## 2013-05-29 MED ORDER — HYDROCODONE-ACETAMINOPHEN 5-325 MG PO TABS
1.0000 | ORAL_TABLET | Freq: Once | ORAL | Status: AC
  Administered 2013-05-29: 1 via ORAL
  Filled 2013-05-29: qty 1

## 2013-05-29 MED ORDER — TETANUS-DIPHTH-ACELL PERTUSSIS 5-2.5-18.5 LF-MCG/0.5 IM SUSP
0.5000 mL | Freq: Once | INTRAMUSCULAR | Status: AC
  Administered 2013-05-29: 0.5 mL via INTRAMUSCULAR
  Filled 2013-05-29: qty 0.5

## 2013-05-29 NOTE — ED Notes (Signed)
MD at bedside. 

## 2013-05-29 NOTE — ED Notes (Signed)
CT notified pt ready for scan 

## 2013-05-29 NOTE — ED Notes (Signed)
Admitted via [facility], pt walking into [facility] to get prescriptions and fell and hit head. Small hematoma on R forehead with R facial tenderness. Pt on Coumadin. Patient stated she did not lose consciousness but does not remember falling until she hit the ground. BP via EMS 190/70 HR 90

## 2013-05-29 NOTE — ED Notes (Signed)
Pt c/o right sided chest pain, swelling noted at left chest.

## 2013-05-29 NOTE — ED Provider Notes (Addendum)
CSN: [id number]     Arrival date & time 05/29/13  1821 History   First MD Initiated Contact with Patient 05/29/13 [place]     Chief Complaint  Patient presents with  . Fall    Fall on concrete [facility]  . Headache     (Consider location/radiation/quality/duration/timing/severity/associated sxs/prior Treatment) Patient is a 80 y.o. female presenting with fall and headaches. The history is provided by the patient.  Fall This is a new ([facility] and thinks her feet got tangled) problem. The current episode started 1 to 2 hours ago. The problem occurs constantly. The problem has not changed since onset.Associated symptoms include headaches. Pertinent negatives include no chest pain and no shortness of breath. Associated symptoms comments: Right sided c-spine pain and right facial pain and cut.  No LOC.. Nothing aggravates the symptoms. Nothing relieves the symptoms. She has tried nothing for the symptoms. The treatment provided no relief.  Headache Associated symptoms: no dizziness     Past Medical History  Diagnosis Date  . Diverticulosis     chronic issues with consitpation and diarrhea  . Carotid artery disease   . Hypercholesterolemia   . Osteoarthritis     chronic-on multiple pain meds.  . Paroxysmal atrial fibrillation     not on coumadin-Cards=[facility]-saw him ?1 yr ago  . CHF (congestive heart failure), NYHA class II     has sudden onset decom CHF? 2/2 to PNA  . Lumbar disc disease   . Hypertensive heart disease without CHF   . Lumbar disc disease   . Heart murmur   . Pneumonia     few times  . Hypothyroidism   . Anemia   . Hypertension   . Mitral regurgitation   . Urinary tract infection     hx of UTI  . Irregular heart beat    Past Surgical History  Procedure Laterality Date  . Carpal tunnel release      rt  . Rotator cuff repair      2 on right, 1 on left  . Abdominal hysterectomy    . Cataract extraction      bil  . Knee surgery     bilateral arthroscopy  . Tee without cardioversion  01/13/2011    Procedure: TRANSESOPHAGEAL ECHOCARDIOGRAM (TEE);  Surgeon: Josue Hector, MD;  Location: [place];  Service: Cardiovascular;  Laterality: Left;  . Cardiac catheterization  01/14/11  . Mitral valve repair  01/21/2011    Procedure: MITRAL VALVE REPAIR (MVR);  Surgeon: Rexene Alberts, MD;  Location: [place];  Service: Open Heart Surgery;  Laterality: N/A;  . Chest tube insertion  02/05/2011    Procedure: INSERTION PLEURAL DRAINAGE CATHETER;  Surgeon: Rexene Alberts, MD;  Location: [place];  Service: Thoracic;  Laterality: Right;  . Removal of pleural drainage catheter  04/09/2011    Procedure: REMOVAL OF PLEURAL DRAINAGE CATHETER;  Surgeon: Rexene Alberts, MD;  Location: [place];  Service: Thoracic;  Laterality: Right;  . Talc pleurodesis  04/09/2011    Procedure: Pietro Cassis;  Surgeon: Rexene Alberts, MD;  Location: [facility] OR;  Service: Thoracic;  Laterality: Right;  . Fracture surgery  Aug. 2013    Right wrist   . Carotid endarterectomy Right 10-09-03    with resection of redundant CCA   Family History  Problem Relation Age of Onset  . Cancer Mother   . Stroke Father   . Cancer Father   . Diabetes Sister   .  Heart disease Sister     Heart Disease before age 62  . Cancer Brother   . Heart disease Sister    History  Substance Use Topics  . Smoking status: Never Smoker   . Smokeless tobacco: Never Used  . Alcohol Use: No   OB History   Grav Para Term Preterm Abortions TAB SAB Ect Mult Living                 Review of Systems  Respiratory: Negative for shortness of breath.   Cardiovascular: Negative for chest pain.  Neurological: Positive for headaches. Negative for dizziness, weakness and light-headedness.  All other systems reviewed and are negative.      Allergies  Review of patient's allergies indicates no known allergies.  Home Medications   Current Outpatient Rx  Name  Route  Sig  Dispense   Refill  . atorvastatin (LIPITOR) 80 MG tablet   Oral   Take 80 mg by mouth at bedtime.          Marland Kitchen HYDROcodone-acetaminophen (VICODIN) 5-500 MG per tablet   Oral   Take 1-2 tablets by mouth every 8 (eight) hours as needed. pain         . levothyroxine (SYNTHROID, LEVOTHROID) 75 MCG tablet   Oral   Take 75 mcg by mouth daily.          . metoprolol tartrate (LOPRESSOR) 25 MG tablet   Oral   Take 37.5 mg by mouth 2 (two) times daily.         Vladimir Faster Glycol-Propyl Glycol (SYSTANE OP)   Ophthalmic   Apply 2 drops to eye 4 (four) times daily as needed. For dry eyes.         . polyethylene glycol (MIRALAX / GLYCOLAX) packet   Oral   Take 17 g by mouth daily.          . potassium chloride SA (K-DUR,KLOR-CON) 20 MEQ tablet   Oral   Take 20 mEq by mouth 2 (two) times daily.         Marland Kitchen EXPIRED: furosemide (LASIX) 40 MG tablet   Oral   Take 1 tablet (40 mg total) by mouth daily.   30 tablet      . EXPIRED: warfarin (COUMADIN) 2 MG tablet   Oral   Take 2-3 mg by mouth daily at 6 PM. On Monday, Wed, and Friday 3 mg , 2 mg all other days.          BP 157/54  Pulse 70  Temp(Src) 98 F (36.7 C) (Oral)  Resp 12  SpO2 100% Physical Exam  Nursing note and vitals reviewed. Constitutional: She is oriented to person, place, and time. She appears well-developed and well-nourished. No distress.  HENT:  Head: Normocephalic. Head is with contusion and with laceration.    Mouth/Throat: Oropharynx is clear and moist.  Eyes: Conjunctivae and EOM are normal. Pupils are equal, round, and reactive to light.  Neck: Normal range of motion. Neck supple. Muscular tenderness present. No spinous process tenderness present.    Cardiovascular: Normal rate, regular rhythm and intact distal pulses.   No murmur heard. Pulmonary/Chest: Effort normal and breath sounds normal. No respiratory distress. She has no wheezes. She has no rales.  Abdominal: Soft. She exhibits no distension.  There is no tenderness. There is no rebound and no guarding.  Musculoskeletal: Normal range of motion. She exhibits no edema and no tenderness.       Right shoulder: Normal.  Left shoulder: Normal.       Right hip: Normal.       Left hip: Normal.       Right knee: Normal.       Left knee: Normal.  Neurological: She is alert and oriented to person, place, and time.  Skin: Skin is warm and dry. No rash noted. No erythema.  Psychiatric: She has a normal mood and affect. Her behavior is normal.    ED Course  Procedures (including critical care time) Labs Review Labs Reviewed  PROTIME-INR - Abnormal; Notable for the following:    Prothrombin Time 22.7 (*)    INR 2.08 (*)    All other components within normal limits   Imaging Review Ct Head Wo Contrast  05/29/2013   ADDENDUM REPORT: 05/29/2013 22:48  ADDENDUM: The note that this exam was also supposed to include a maxillofacial bone and cervical spine CT which will be dictated as follows:  MAXILLOFACIAL CT AND CERVICAL SPINE CT 05/29/2013:  COMPARISON:  Cervical spine CT 11/30/2003  FINDINGS: Examination demonstrates a subtle minimally displaced fracture involving lateral wall of the right orbit. There is also a minimally displaced fracture of the lateral wall of the right maxillary sinus extending inferiorly to the junction of the maxilla. There is an air-fluid level over the right maxillary sinus compatible hemorrhagic debris. Remainder of the sinuses are within normal. No additional facial bone fractures are identified. There is moderate degenerate change of the left temporomandibular joint. There are degenerative changes of the spine.  Vertebral body alignment and heights are within normal. There is moderate spondylosis. There is moderate disc space narrowing from the C3-4 level to the C6-7 level. There is subtle 2-3 mm anterior subluxation of C7 on T1 unchanged and degenerative in nature. Prevertebral soft tissues are within normal. The  atlantoaxial articulation is unremarkable. There is uncovertebral joint spurring. There is no acute fracture. Remainder the exam is unremarkable.  IMPRESSION: Nondisplaced fracture of the lateral wall the right orbit as well as the lateral wall of the right maxillary sinus extending inferiorly to the junction of the maxilla. Associated hemorrhagic debris within the right maxillary sinus.  No acute cervical spine injury.  Moderate spondylosis throughout the cervical spine with multilevel disc disease. Subtle stable anterior subluxation of C7 on T1 degenerative in nature.   Electronically Signed   By: Marin Olp M.D.   On: 05/29/2013 22:48   05/29/2013   CLINICAL DATA:  Fall, anticoagulation.  EXAM: CT HEAD WITHOUT CONTRAST  TECHNIQUE: Contiguous axial images were obtained from the base of the skull through the vertex without intravenous contrast.  COMPARISON:  11/30/2003  FINDINGS: Ventricles, cisterns and other CSF spaces are within normal. There is mild chronic ischemic microvascular disease present. There is no mass, mass effect, shift of midline structures or acute hemorrhage. There is no evidence of acute infarction. Small air-fluid level over the right maxillary sinus is present. There is a subtle fracture involving the lateral wall of the right orbit as well as lateral wall the right maxillary sinus extending inferiorly to the level of the maxilla. Remaining bony structures are unremarkable.  IMPRESSION: No acute intracranial findings.  Subtle fracture involving the lateral wall of the right orbit as well as the lateral wall of the right maxillary sinus extending inferiorly towards the maxilla. Associated air-fluid level within the right maxillary sinus.  Mild chronic ischemic microvascular disease.  Electronically Signed: By: Marin Olp M.D. On: 05/29/2013 21:24   Dg Chest Port 1  View  05/29/2013   CLINICAL DATA:  Chest pain.  EXAM: PORTABLE CHEST - 1 VIEW  COMPARISON:  Chest radiograph performed  04/20/2011  FINDINGS: The lungs are well-aerated and clear. There is no evidence of focal opacification, pleural effusion or pneumothorax.  The cardiomediastinal silhouette is borderline normal in size. The patient is status post median sternotomy. An aortic valve replacement is noted. No acute osseous abnormalities are seen. There is mild chronic deformity of the left humeral head.  IMPRESSION: No acute cardiopulmonary process seen.   Electronically Signed   By: Garald Balding M.D.   On: 05/29/2013 22:44     EKG Interpretation None     LACERATION REPAIR Performed by: Blanchie Dessert Authorized by: Blanchie Dessert Consent: Verbal consent obtained. Risks and benefits: risks, benefits and alternatives were discussed Consent given by: patient Patient identity confirmed: provided demographic data Prepped and Draped in normal sterile fashion Wound explored  Laceration Location: eyebrow  Laceration Length: 1cm  No Foreign Bodies seen or palpated  Anesthesia:none Irrigation method: syringe Amount of cleaning: standard  Skin closure: dermabond  Number of sutures: dermabond  Technique: dermabond  Patient tolerance: Patient tolerated the procedure well with no immediate complications.  MDM   Final diagnoses:  Fall  Facial fracture  Facial laceration  Chest wall contusion    [facility] noted to the right side of the face and also complaining of a headache and right-sided neck pain. Patient takes Coumadin for paroxysmal atrial fibrillation and states 10 days ago her INR was checked and was 2.5.  Patient has no evidence of injury below the neck and able to range all extremities and joints without any pain.  INR, head CT, C-spine CT and facial CT pending. Patient's laceration repaired with Dermabond and tetanus updated.  10:54 PM INR is 2.08 and imaging positive for 2 nondisplaced facial fx but o/w normal.  On re-eval pt now having pain in  the chest with contusion but neg for rib fx.  Will d/c home with family.  Pt has pain meds at thome.   Blanchie Dessert, MD 05/29/13 2947  Blanchie Dessert, MD 05/29/13 2259

## 2013-06-12 ENCOUNTER — Ambulatory Visit: Payer: Medicare Other | Attending: Family Medicine

## 2013-06-12 DIAGNOSIS — IMO0001 Reserved for inherently not codable concepts without codable children: Secondary | ICD-10-CM | POA: Insufficient documentation

## 2013-06-12 DIAGNOSIS — R279 Unspecified lack of coordination: Secondary | ICD-10-CM | POA: Insufficient documentation

## 2013-06-12 DIAGNOSIS — R262 Difficulty in walking, not elsewhere classified: Secondary | ICD-10-CM | POA: Insufficient documentation

## 2013-06-12 DIAGNOSIS — R293 Abnormal posture: Secondary | ICD-10-CM | POA: Insufficient documentation

## 2013-06-14 ENCOUNTER — Ambulatory Visit: Payer: Medicare Other | Admitting: Physical Therapy

## 2013-06-21 ENCOUNTER — Ambulatory Visit: Payer: Medicare Other | Admitting: Rehabilitative and Restorative Service Providers"

## 2013-06-23 ENCOUNTER — Ambulatory Visit: Payer: Medicare Other | Attending: Family Medicine | Admitting: Rehabilitative and Restorative Service Providers"

## 2013-06-23 DIAGNOSIS — R279 Unspecified lack of coordination: Secondary | ICD-10-CM | POA: Insufficient documentation

## 2013-06-23 DIAGNOSIS — R293 Abnormal posture: Secondary | ICD-10-CM | POA: Insufficient documentation

## 2013-06-23 DIAGNOSIS — R262 Difficulty in walking, not elsewhere classified: Secondary | ICD-10-CM | POA: Insufficient documentation

## 2013-06-23 DIAGNOSIS — IMO0001 Reserved for inherently not codable concepts without codable children: Secondary | ICD-10-CM | POA: Insufficient documentation

## 2013-06-28 ENCOUNTER — Ambulatory Visit: Payer: Medicare Other

## 2013-06-30 ENCOUNTER — Ambulatory Visit: Payer: Medicare Other

## 2013-07-04 ENCOUNTER — Ambulatory Visit: Payer: Medicare Other

## 2013-07-07 ENCOUNTER — Ambulatory Visit: Payer: Medicare Other

## 2013-07-11 ENCOUNTER — Ambulatory Visit: Payer: Medicare Other

## 2013-07-14 ENCOUNTER — Ambulatory Visit: Payer: Medicare Other

## 2013-12-18 ENCOUNTER — Encounter: Payer: Self-pay | Admitting: Family

## 2013-12-19 ENCOUNTER — Ambulatory Visit (INDEPENDENT_AMBULATORY_CARE_PROVIDER_SITE_OTHER): Payer: Medicare Other | Admitting: Family

## 2013-12-19 ENCOUNTER — Encounter: Payer: Self-pay | Admitting: Family

## 2013-12-19 ENCOUNTER — Ambulatory Visit (HOSPITAL_COMMUNITY)
Admission: RE | Admit: 2013-12-19 | Discharge: 2013-12-19 | Disposition: A | Discharge status: Home / Self Care | Payer: Medicare Other | Source: Ambulatory Visit | Attending: Family | Admitting: Family

## 2013-12-19 VITALS — BP 141/69 | HR 64 | Resp 16 | Ht 59.0 in | Wt 123.8 lb

## 2013-12-19 DIAGNOSIS — I6523 Occlusion and stenosis of bilateral carotid arteries: Secondary | ICD-10-CM | POA: Diagnosis present

## 2013-12-19 DIAGNOSIS — Z48812 Encounter for surgical aftercare following surgery on the circulatory system: Secondary | ICD-10-CM

## 2013-12-19 DIAGNOSIS — M79662 Pain in left lower leg: Secondary | ICD-10-CM

## 2013-12-19 DIAGNOSIS — I6529 Occlusion and stenosis of unspecified carotid artery: Secondary | ICD-10-CM | POA: Insufficient documentation

## 2013-12-19 NOTE — Progress Notes (Signed)
Established Carotid Patient   History of Present Illness  Desiree [name] is a 80 y.o. female patient of Dr. Donnetta Hutching returns today for follow up who is status post right CEA with resection of redundant CCA in August 2005.  Patient has Negative history of TIA or stroke symptoms. The patient denies amaurosis fugax or monocular blindness. The patient denies facial drooping.  Pt. denies hemiplegia. The patient denies receptive or expressive aphasia. Pt. denies extremity weakness.  She had a mitral valve repair 2 years ago, cardiologist is Dr. Wynonia Lawman.  Patient denies New Medical or Surgical History.   She walks her dog twice daily, over an hour daily.  She has severe arthritis in her spine, sees Dr. Carloyn Manner, neurologist, who has told her that the pain in her legs is from her back issues.  One leg is shorter than another, this was discovered since she has had a back evaluation. She fell within the last year, does not know how the fall happened.   Pt states her feet have not felt normal since she had a CABG in 2012, "they feel like they are half asleep sometimes", feet do not feel as bad during the day.  Pt Diabetic: No  Pt smoker: non-smoker  Pt meds include:  Statin : Yes  ASA: No  Other anticoagulants/antiplatelets: coumadin, taking since mitral valve repair   Past Medical History  Diagnosis Date  . Diverticulosis     chronic issues with consitpation and diarrhea  . Carotid artery disease   . Hypercholesterolemia   . Osteoarthritis     chronic-on multiple pain meds.  . Paroxysmal atrial fibrillation     not on coumadin-Cards=[facility]-saw him ?1 yr ago  . CHF (congestive heart failure), NYHA class II     has sudden onset decom CHF? 2/2 to PNA  . Lumbar disc disease   . Hypertensive heart disease without CHF   . Lumbar disc disease   . Heart murmur   . Pneumonia     few times  . Hypothyroidism   . Anemia   . Hypertension   . Mitral regurgitation   . Urinary tract infection     hx of  UTI  . Irregular heart beat     Social History History  Substance Use Topics  . Smoking status: Never Smoker   . Smokeless tobacco: Never Used  . Alcohol Use: No    Family History Family History  Problem Relation Age of Onset  . Cancer Mother   . Stroke Father   . Cancer Father   . Diabetes Sister   . Heart disease Sister     Heart Disease before age 65  . Cancer Brother   . Heart disease Sister     Surgical History Past Surgical History  Procedure Laterality Date  . Carpal tunnel release      rt  . Rotator cuff repair      2 on right, 1 on left  . Abdominal hysterectomy    . Cataract extraction      bil  . Knee surgery      bilateral arthroscopy  . Tee without cardioversion  01/13/2011    Procedure: TRANSESOPHAGEAL ECHOCARDIOGRAM (TEE);  Surgeon: Josue Hector, MD;  Location: [place];  Service: Cardiovascular;  Laterality: Left;  . Cardiac catheterization  01/14/11  . Mitral valve repair  01/21/2011    Procedure: MITRAL VALVE REPAIR (MVR);  Surgeon: Rexene Alberts, MD;  Location: [place];  Service: Open Heart Surgery;  Laterality: N/A;  . Chest tube insertion  02/05/2011    Procedure: INSERTION PLEURAL DRAINAGE CATHETER;  Surgeon: Rexene Alberts, MD;  Location: [place];  Service: Thoracic;  Laterality: Right;  . Removal of pleural drainage catheter  04/09/2011    Procedure: REMOVAL OF PLEURAL DRAINAGE CATHETER;  Surgeon: Rexene Alberts, MD;  Location: [place];  Service: Thoracic;  Laterality: Right;  . Talc pleurodesis  04/09/2011    Procedure: Pietro Cassis;  Surgeon: Rexene Alberts, MD;  Location: [facility] OR;  Service: Thoracic;  Laterality: Right;  . Fracture surgery  Aug. 2013    Right wrist   . Carotid endarterectomy Right 10-09-03    with resection of redundant CCA    No Known Allergies  Current Outpatient Prescriptions  Medication Sig Dispense Refill  . atorvastatin (LIPITOR) 80 MG tablet Take 80 mg by mouth at bedtime.       . furosemide (LASIX) 40  MG tablet Take 1 tablet (40 mg total) by mouth daily.  30 tablet    . HYDROcodone-acetaminophen (VICODIN) 5-500 MG per tablet Take 1-2 tablets by mouth every 8 (eight) hours as needed. pain      . levothyroxine (SYNTHROID, LEVOTHROID) 75 MCG tablet Take 75 mcg by mouth daily.       . metoprolol tartrate (LOPRESSOR) 25 MG tablet Take 37.5 mg by mouth 2 (two) times daily.      Vladimir Faster Glycol-Propyl Glycol (SYSTANE OP) Apply 2 drops to eye 4 (four) times daily as needed. For dry eyes.      . polyethylene glycol (MIRALAX / GLYCOLAX) packet Take 17 g by mouth daily.       . potassium chloride SA (K-DUR,KLOR-CON) 20 MEQ tablet Take 20 mEq by mouth 2 (two) times daily.      Marland Kitchen warfarin (COUMADIN) 2 MG tablet Take 2-3 mg by mouth daily at 6 PM. On Monday, Wed, and Friday 3 mg , 2 mg all other days.       No current facility-administered medications for this visit.    Review of Systems : See HPI for pertinent positives and negatives.  Physical Examination  Filed Vitals:   12/19/13 0948 12/19/13 0951  BP: 131/68 141/69  Pulse: 65 64  Resp:  16  Height:  4\' 11"  (1.499 m)  Weight:  123 lb 12.8 oz (56.155 kg)  SpO2:  99%   Body mass index is 24.99 kg/(m^2).  General: WDWN female in NAD  GAIT: slow and deliberate  Eyes: PERRLA  Pulmonary: CTAB, Negative Rales, Negative rhonchi, & Negative wheezing.  Cardiac: Regular rhythm , mitral valve click detected, murmur appreciated.   VASCULAR EXAM  Carotid Bruits  Left  Right    Positive  positive  Aorta is not palpable.  Radial pulses are 2+ palpable and equal.  LE Pulses  LEFT  RIGHT   FEMORAL 2+ 1+  POPLITEAL  not palpable  not palpable   POSTERIOR TIBIAL  not palpable  not palpable   DORSALIS PEDIS  ANTERIOR TIBIAL  not palpable  faintlypalpable    Gastrointestinal: soft, nontender, BS WNL, no r/g, no palpated masses.  Musculoskeletal: Negative muscle atrophy/wasting. M/S 3/5 throughout, Extremities without ischemic changes.   Neurologic: A&O X 3; Appropriate Affect ; SENSATION ;normal;  Speech is normal  CN 2-12 intact, Pain and light touch intact in extremities, Motor exam as listed above.   Non-Invasive Vascular Imaging CAROTID DUPLEX 12/19/2013   CEREBROVASCULAR DUPLEX EVALUATION    INDICATION: Carotid endarterectomy  PREVIOUS INTERVENTION(S): Right carotid endarterectomy with redundant CCA resection in 2005     DUPLEX EXAM:     RIGHT  LEFT  Peak Systolic Velocities (cm/s) End Diastolic Velocities (cm/s) Plaque LOCATION Peak Systolic Velocities (cm/s) End Diastolic Velocities (cm/s) Plaque  63 13  CCA PROXIMAL 66 12   78 15  CCA MID 70 16   115 20 HT CCA DISTAL 70 17 HT  137 0  ECA 360 0 HT  103 21  ICA PROXIMAL 99 24 CP  102 25  ICA MID 83 24   78 22  ICA DISTAL 76 27     Not Calculated ICA / CCA Ratio (PSV) 1.4  Antegrade Vertebral Flow Antegrade  850 Brachial Systolic Pressure (mmHg) 277  Multiphasic (subclavian artery) Brachial Artery Waveforms Multiphasic (subclavian artery)    Plaque Morphology:  HM = Homogeneous, HT = Heterogeneous, CP = Calcific Plaque, SP = Smooth Plaque, IP = Irregular Plaque     ADDITIONAL FINDINGS:   No significant stenosis of the right external or bilateral common carotid arteries.   Left external carotid artery stenosis noted.    IMPRESSION: 1. Patent right carotid endarterectomy site with no right internal carotid artery stenosis. 2. Doppler velocities suggest a less than 40% stenosis of the left proximal internal carotid artery.    Compared to the previous exam:  No significant change noted when compared to the previous exam on 12/20/12.        Assessment: JANAYLA MARIK is a 80 y.o. female who is status post right CEA with resection of redundant CCA in August 2005.  Patient has Negative history of TIA or stroke symptoms. Today's carotid Duplex indicates a patent right carotid endarterectomy site with no right internal carotid artery stenosis and a  less than 40% stenosis of the left proximal internal carotid artery. No significant change noted when compared to the previous exam on 12/20/12.   Plan: Follow-up in 1 year with Carotid Duplex scan.   I discussed in depth with the patient the nature of atherosclerosis, and emphasized the importance of maximal medical management including strict control of blood pressure, blood glucose, and lipid levels, obtaining regular exercise, and continued cessation of smoking.  The patient is aware that without maximal medical management the underlying atherosclerotic disease process will progress, limiting the benefit of any interventions. The patient was given information about stroke prevention and what symptoms should prompt the patient to seek immediate medical care. Thank you for allowing [place] to participate in this patient's care.  Clemon Chambers, RN, MSN, FNP-C [facility] Office: [phone]  Clinic Physician: Early  12/19/2013 9:41 AM

## 2013-12-19 NOTE — Patient Instructions (Signed)
Stroke Prevention Some medical conditions and behaviors are associated with an increased chance of having a stroke. You may prevent a stroke by making healthy choices and managing medical conditions. HOW CAN I REDUCE MY RISK OF HAVING A STROKE?   Stay physically active. Get at least 30 minutes of activity on most or all days.  Do not smoke. It may also be helpful to avoid exposure to secondhand smoke.  Limit alcohol use. Moderate alcohol use is considered to be:  No more than 2 drinks per day for men.  No more than 1 drink per day for nonpregnant women.  Eat healthy foods. This involves:  Eating 5 or more servings of fruits and vegetables a day.  Making dietary changes that address high blood pressure (hypertension), high cholesterol, diabetes, or obesity.  Manage your cholesterol levels.  Making food choices that are high in fiber and low in saturated fat, trans fat, and cholesterol may control cholesterol levels.  Take any prescribed medicines to control cholesterol as directed by your health care provider.  Manage your diabetes.  Controlling your carbohydrate and sugar intake is recommended to manage diabetes.  Take any prescribed medicines to control diabetes as directed by your health care provider.  Control your hypertension.  Making food choices that are low in salt (sodium), saturated fat, trans fat, and cholesterol is recommended to manage hypertension.  Take any prescribed medicines to control hypertension as directed by your health care provider.  Maintain a healthy weight.  Reducing calorie intake and making food choices that are low in sodium, saturated fat, trans fat, and cholesterol are recommended to manage weight.  Stop drug abuse.  Avoid taking birth control pills.  Talk to your health care provider about the risks of taking birth control pills if you are over 35 years old, smoke, get migraines, or have ever had a blood clot.  Get evaluated for sleep  disorders (sleep apnea).  Talk to your health care provider about getting a sleep evaluation if you snore a lot or have excessive sleepiness.  Take medicines only as directed by your health care provider.  For some people, aspirin or blood thinners (anticoagulants) are helpful in reducing the risk of forming abnormal blood clots that can lead to stroke. If you have the irregular heart rhythm of atrial fibrillation, you should be on a blood thinner unless there is a good reason you cannot take them.  Understand all your medicine instructions.  Make sure that other conditions (such as anemia or atherosclerosis) are addressed. SEEK IMMEDIATE MEDICAL CARE IF:   You have sudden weakness or numbness of the face, arm, or leg, especially on one side of the body.  Your face or eyelid droops to one side.  You have sudden confusion.  You have trouble speaking (aphasia) or understanding.  You have sudden trouble seeing in one or both eyes.  You have sudden trouble walking.  You have dizziness.  You have a loss of balance or coordination.  You have a sudden, severe headache with no known cause.  You have new chest pain or an irregular heartbeat. Any of these symptoms may represent a serious problem that is an emergency. Do not wait to see if the symptoms will go away. Get medical help at once. Call your local emergency services (911 in [place] Do not drive yourself to the hospital. Document Released: 03/19/2004 Document Revised: 06/26/2013 Document Reviewed: 08/12/2012 [employer] Patient Information 2015 [employer]. This information is not intended to replace advice given   to you by your health care provider. Make sure you discuss any questions you have with your health care provider.  

## 2013-12-19 NOTE — Addendum Note (Signed)
Addended by: Mena Goes on: 12/19/2013 03:40 PM   Modules accepted: Orders

## 2014-01-31 ENCOUNTER — Encounter (HOSPITAL_COMMUNITY): Payer: Self-pay | Admitting: Cardiology

## 2014-03-15 ENCOUNTER — Telehealth: Payer: Self-pay | Admitting: Internal Medicine

## 2014-03-15 NOTE — Telephone Encounter (Signed)
S/W PATIENT AND GAVE NP APPT FOR 02/15 @ 1:45 W/DR. [place] D-PANCYTOPENIA

## 2014-04-09 ENCOUNTER — Ambulatory Visit (HOSPITAL_BASED_OUTPATIENT_CLINIC_OR_DEPARTMENT_OTHER): Payer: PPO

## 2014-04-09 ENCOUNTER — Ambulatory Visit (HOSPITAL_BASED_OUTPATIENT_CLINIC_OR_DEPARTMENT_OTHER): Payer: PPO | Admitting: Internal Medicine

## 2014-04-09 ENCOUNTER — Other Ambulatory Visit: Payer: Self-pay | Admitting: Internal Medicine

## 2014-04-09 ENCOUNTER — Encounter: Payer: Self-pay | Admitting: Internal Medicine

## 2014-04-09 VITALS — BP 164/59 | HR 70 | Temp 97.6°F | Resp 18 | Ht 59.0 in | Wt 123.8 lb

## 2014-04-09 DIAGNOSIS — D61818 Other pancytopenia: Secondary | ICD-10-CM

## 2014-04-09 DIAGNOSIS — D539 Nutritional anemia, unspecified: Secondary | ICD-10-CM

## 2014-04-09 DIAGNOSIS — D649 Anemia, unspecified: Secondary | ICD-10-CM

## 2014-04-09 LAB — COMPREHENSIVE METABOLIC PANEL (CC13)
ALT: 21 U/L (ref 0–55)
AST: 24 U/L (ref 5–34)
Albumin: 3.9 g/dL (ref 3.5–5.0)
Alkaline Phosphatase: 77 U/L (ref 40–150)
Anion Gap: 9 meq/L (ref 3–11)
BUN: 25.4 mg/dL (ref 7.0–26.0)
CO2: 27 meq/L (ref 22–29)
Calcium: 9.5 mg/dL (ref 8.4–10.4)
Chloride: 107 meq/L (ref 98–109)
Creatinine: 0.9 mg/dL (ref 0.6–1.1)
EGFR: 60 ml/min/1.73 m2 — ABNORMAL LOW
Glucose: 76 mg/dL (ref 70–140)
Potassium: 4.7 meq/L (ref 3.5–5.1)
Sodium: 143 meq/L (ref 136–145)
Total Bilirubin: 0.4 mg/dL (ref 0.20–1.20)
Total Protein: 6.9 g/dL (ref 6.4–8.3)

## 2014-04-09 LAB — CBC WITH DIFFERENTIAL/PLATELET
BASO%: 1.1 % (ref 0.0–2.0)
Basophils Absolute: 0 10e3/uL (ref 0.0–0.1)
EOS%: 1.4 % (ref 0.0–7.0)
Eosinophils Absolute: 0.1 10e3/uL (ref 0.0–0.5)
HCT: 34.7 % — ABNORMAL LOW (ref 34.8–46.6)
HGB: 11.1 g/dL — ABNORMAL LOW (ref 11.6–15.9)
LYMPH%: 22.8 % (ref 14.0–49.7)
MCH: 30.2 pg (ref 25.1–34.0)
MCHC: 31.9 g/dL (ref 31.5–36.0)
MCV: 94.9 fL (ref 79.5–101.0)
MONO#: 0.4 10e3/uL (ref 0.1–0.9)
MONO%: 8.9 % (ref 0.0–14.0)
NEUT#: 2.7 10e3/uL (ref 1.5–6.5)
NEUT%: 65.8 % (ref 38.4–76.8)
Platelets: 154 10e3/uL (ref 145–400)
RBC: 3.66 10e6/uL — ABNORMAL LOW (ref 3.70–5.45)
RDW: 13.1 % (ref 11.2–14.5)
WBC: 4.1 10e3/uL (ref 3.9–10.3)
lymph#: 0.9 10e3/uL (ref 0.9–3.3)

## 2014-04-09 LAB — IRON AND TIBC CHCC
%SAT: 23 % (ref 21–57)
IRON: 71 ug/dL (ref 41–142)
TIBC: 305 ug/dL (ref 236–444)
UIBC: 235 ug/dL (ref 120–384)

## 2014-04-09 LAB — LACTATE DEHYDROGENASE (CC13): LDH: 224 U/L (ref 125–245)

## 2014-04-09 LAB — FERRITIN CHCC: Ferritin: 67 ng/mL (ref 9–269)

## 2014-04-09 NOTE — Progress Notes (Signed)
Desiree [name] [phone]   [phone]  CONSULT NOTE  REFERRING PHYSICIAN: Dr. London [name]  REASON FOR CONSULTATION:  81 years old white female with pancytopenia.  HPI Desiree [name] is a 81 y.o. female with past medical history significant for multiple medical problems including history of hypertension, coronary artery disease, congestive heart failure, hypothyroidism, paroxysmal atrial fibrillation, osteoarthritis and dyslipidemia. The [name] was seen recently by her primary care physician for routine evaluation and CBC performed on 03/08/2014 showed low white blood count of 3.4, hemoglobin 10.8, hematocrit 34.3% and platelets count 139,000. The [name] was referred to me today for evaluation and recommendation regarding these abnormalities. When seen today she denied having any specific complaints except for low back pain and she is currently on hydrocodone. The [name] denied having any significant fatigue or weakness. Denied having any significant weight loss or night sweats. She has occasional dizzy spells. She denied having any bleeding issues. Her last colonoscopy was performed several years ago by Dr. Cristina [name]. She was seen for evaluation of anemia in October 2005 and bone marrow biopsy and aspirate at that time showed a hypercellular marrow for age with trilineage hematopoiesis. She has no other significant complaints today. The [name] denied having any significant chest pain, shortness breath, cough or hemoptysis. She has no nausea or vomiting. She is not on any iron supplements. Family history significant for mother was diagnosed with Pellegra and father died from cerebral hemorrhage. The [name] is a widow and has 2 children. She was accompanied by her daughter Desiree [name]. She used to work at a funeral home. She has no history of smoking, alcohol or drug abuse.  HPI  Past Medical History  Diagnosis Date  . Diverticulosis     chronic issues with  consitpation and diarrhea  . Carotid artery disease   . Hypercholesterolemia   . Osteoarthritis     chronic-on multiple pain meds.  . Paroxysmal atrial fibrillation     not on Desiree [name]-Cards=Desiree [name]-saw him ?1 yr ago  . CHF (congestive heart failure), NYHA class II     has sudden onset decom CHF? 2/2 to PNA  . Lumbar disc disease   . Hypertensive heart disease without CHF   . Lumbar disc disease   . Heart murmur   . Pneumonia     few times  . Hypothyroidism   . Anemia   . Hypertension   . Mitral regurgitation   . Urinary tract infection     hx of UTI  . Irregular heart beat     Past Surgical History  Procedure Laterality Date  . Carpal tunnel release      rt  . Rotator cuff repair      2 on right, 1 on left  . Abdominal hysterectomy    . Cataract extraction      bil  . Knee surgery      bilateral arthroscopy  . Tee without cardioversion  01/13/2011    Procedure: TRANSESOPHAGEAL ECHOCARDIOGRAM (TEE);  Surgeon: Desiree Hector, MD;  Location: [facility];  Service: Cardiovascular;  Laterality: Left;  . Cardiac catheterization  01/14/11  . Mitral valve repair  01/21/2011    Procedure: MITRAL VALVE REPAIR (MVR);  Surgeon: Desiree Alberts, MD;  Location: [facility];  Service: Open Heart Surgery;  Laterality: N/A;  . Chest tube insertion  02/05/2011    Procedure: INSERTION PLEURAL DRAINAGE CATHETER;  Surgeon: Desiree Alberts, MD;  Location: [facility];  Service: Thoracic;  Laterality: Right;  .  Removal of pleural drainage catheter  04/09/2011    Procedure: REMOVAL OF PLEURAL DRAINAGE CATHETER;  Surgeon: Desiree Alberts, MD;  Location: [facility];  Service: Thoracic;  Laterality: Right;  . Talc pleurodesis  04/09/2011    Procedure: Desiree [name];  Surgeon: Desiree Alberts, MD;  Location: [facility] OR;  Service: Thoracic;  Laterality: Right;  . Fracture surgery  Aug. 2013    Right wrist   . Carotid endarterectomy Right 10-09-03    with resection of redundant CCA  . Eye surgery    . Left and right  heart catheterization with coronary angiogram N/A 01/14/2011    Procedure: LEFT AND RIGHT HEART CATHETERIZATION WITH CORONARY ANGIOGRAM;  Surgeon: Desiree Reedy, MD;  Location: [facility] CATH LAB;  Service: Cardiovascular;  Laterality: N/A;    Family History  Problem Relation Age of Onset  . Cancer Mother   . Stroke Father   . Cancer Father   . Diabetes Sister   . Heart disease Sister     Heart Disease before age 67  . Cancer Brother   . Heart disease Sister     Social History History  Substance Use Topics  . Smoking status: Never Smoker   . Smokeless tobacco: Never Used  . Alcohol Use: No    No Known Allergies  Current Outpatient Prescriptions  Medication Sig Dispense Refill  . atorvastatin (LIPITOR) 80 MG tablet Take 80 mg by mouth at bedtime.     Marland Kitchen levothyroxine (SYNTHROID, LEVOTHROID) 75 MCG tablet Take 75 mcg by mouth daily.     . metoprolol tartrate (LOPRESSOR) 25 MG tablet Take 37.5 mg by mouth 2 (two) times daily.    Desiree [name] Glycol-Propyl Glycol (Desiree [name]) Apply 2 drops to eye 4 (four) times daily as needed. For dry eyes.    . polyethylene glycol (Desiree [name]) packet Take 17 g by mouth daily.     . potassium chloride SA (Desiree [name]) 20 MEQ tablet Take 20 mEq by mouth 2 (two) times daily.    Marland Kitchen warfarin (Desiree [name]) 2 MG tablet Take 2-3 mg by mouth daily at 6 PM. On Monday, Wed, and Friday 3 mg , 2 mg all other days.    . furosemide (Desiree [name]) 40 MG tablet Take 1 tablet (40 mg total) by mouth daily. 30 tablet    No current facility-administered medications for this visit.    Review of Systems  Constitutional: negative Eyes: negative Ears, nose, mouth, throat, and face: negative Respiratory: negative Cardiovascular: negative Gastrointestinal: negative Genitourinary:negative Integument/breast: negative Hematologic/lymphatic: negative Musculoskeletal:positive for back pain Neurological: negative Behavioral/Psych: negative Endocrine:  negative Allergic/Immunologic: negative  Physical Exam  XKG:YJEHU, healthy, no distress, well nourished and well developed SKIN: skin color, texture, turgor are normal, no rashes or significant lesions HEAD: Normocephalic, No masses, lesions, tenderness or abnormalities EYES: normal, PERRLA EARS: External ears normal, Canals clear OROPHARYNX:no exudate and no erythema  NECK: supple, no adenopathy LYMPH:  no palpable lymphadenopathy, no hepatosplenomegaly BREAST:not examined LUNGS: clear to auscultation , and palpation HEART: regular rate & rhythm and no murmurs ABDOMEN:abdomen soft, non-tender, normal bowel sounds and no masses or organomegaly BACK: Back symmetric, no curvature., No CVA tenderness EXTREMITIES:no joint deformities, effusion, or inflammation, no edema, no skin discoloration  NEURO: alert & oriented x 3 with fluent speech, no focal motor/sensory deficits  PERFORMANCE STATUS: ECOG 1  LABORATORY DATA: Lab Results  Component Value Date   WBC 4.1 04/09/2014   HGB 11.1* 04/09/2014   HCT 34.7* 04/09/2014   MCV  94.9 04/09/2014   PLT 154 04/09/2014      Chemistry      Component Value Date/Time   NA 143 04/09/2014 1347   NA 134* 02/09/2011 1015   K 4.7 04/09/2014 1347   K 3.5 02/09/2011 1015   CL 96 02/09/2011 1015   CO2 27 04/09/2014 1347   CO2 30 02/09/2011 1015   BUN 25.4 04/09/2014 1347   BUN 13 02/09/2011 1015   CREATININE 0.9 04/09/2014 1347   CREATININE 0.89 02/09/2011 1015      Component Value Date/Time   CALCIUM 9.5 04/09/2014 1347   CALCIUM 9.0 02/09/2011 1015   ALKPHOS 77 04/09/2014 1347   ALKPHOS 97 01/19/2011 1241   AST 24 04/09/2014 1347   AST 22 01/19/2011 1241   ALT 21 04/09/2014 1347   ALT 22 01/19/2011 1241   BILITOT 0.40 04/09/2014 1347   BILITOT 0.4 01/19/2011 1241       RADIOGRAPHIC STUDIES: No results found.  ASSESSMENT: This is a very pleasant 81 years old white female presented for evaluation of pancytopenia. Repeat CBC  today showed improvement and the total white blood count as well as platelets count. The [name] continues to have mild anemia most likely secondary to nutritional deficiency.   PLAN: I had a lengthy discussion with the [name] and her daughter today about her current condition. I ordered several studies for evaluation of her anemia including repeat CBC, comprehensive metabolic panel, LDH, serum erythropoietin, iron study, ferritin, serum folate, serum vitamin B 12 level as well as serum protein electrophoreses. The [name] is currently asymptomatic except for mild fatigue. Her previous bone marrow biopsy and aspirate several years ago was unremarkable. The [name] is not interested in any further intervention or diagnostic procedure for her condition. I recommended for her to continue on observation for now. I will call the [name] with recommendation if she has any significant abnormalities in the pending blood work. She will continue her routine follow-up visit by her primary care physician and I would see her on an as-needed basis. She was advised to call immediately if she has any concerning symptoms in the interval. The [name] voices understanding of current disease status and treatment options and is in agreement with the current care plan.  All questions were answered. The [name] knows to call the clinic with any problems, questions or concerns. We can certainly see the [name] much sooner if necessary.  Thank you so much for allowing me to participate in the care of Desiree [name]. I will continue to follow up the [name] with you and assist in her care.  I spent 40 minutes counseling the [name] face to face. The total time spent in the appointment was 55 minutes.  Disclaimer: This note was dictated with voice recognition software. Similar sounding words can inadvertently be transcribed and may not be corrected upon review.   Nyjae Hodge K. April 09, 2014, 2:33 PM

## 2014-04-11 LAB — IGG, IGA, IGM
IGA: 285 mg/dL (ref 69–380)
IGG (IMMUNOGLOBIN G), SERUM: 854 mg/dL (ref 690–1700)
IgM, Serum: 553 mg/dL — ABNORMAL HIGH (ref 52–322)

## 2014-04-11 LAB — PROTEIN ELECTROPHORESIS, SERUM, WITH REFLEX
ALBUMIN ELP: 55.8 % (ref 55.8–66.1)
ALPHA-1-GLOBULIN: 5.1 % — AB (ref 2.9–4.9)
Alpha-2-Globulin: 11.2 % (ref 7.1–11.8)
BETA 2: 4.9 % (ref 3.2–6.5)
Beta Globulin: 6.1 % (ref 4.7–7.2)
Gamma Globulin: 16.9 % (ref 11.1–18.8)
TOTAL PROTEIN, SERUM ELECTROPHOR: 6.8 g/dL (ref 6.0–8.3)

## 2014-04-11 LAB — ERYTHROPOIETIN: Erythropoietin: 19.8 m[IU]/mL — ABNORMAL HIGH (ref 2.6–18.5)

## 2014-04-11 LAB — VITAMIN B12: VITAMIN B 12: 734 pg/mL (ref 211–911)

## 2014-04-11 LAB — FOLATE: Folate: >20 ng/mL

## 2014-04-11 LAB — IFE INTERPRETATION

## 2014-10-07 ENCOUNTER — Emergency Department (HOSPITAL_COMMUNITY): Payer: PPO

## 2014-10-07 ENCOUNTER — Emergency Department (HOSPITAL_COMMUNITY)
Admission: EM | Admit: 2014-10-07 | Discharge: 2014-10-08 | Disposition: A | Discharge status: Home / Self Care | Payer: PPO | Attending: Emergency Medicine | Admitting: Emergency Medicine

## 2014-10-07 ENCOUNTER — Encounter (HOSPITAL_COMMUNITY): Payer: Self-pay | Admitting: Emergency Medicine

## 2014-10-07 DIAGNOSIS — Y92009 Unspecified place in unspecified non-institutional (private) residence as the place of occurrence of the external cause: Secondary | ICD-10-CM | POA: Diagnosis not present

## 2014-10-07 DIAGNOSIS — Y9389 Activity, other specified: Secondary | ICD-10-CM | POA: Insufficient documentation

## 2014-10-07 DIAGNOSIS — S0181XA Laceration without foreign body of other part of head, initial encounter: Secondary | ICD-10-CM | POA: Diagnosis present

## 2014-10-07 DIAGNOSIS — R011 Cardiac murmur, unspecified: Secondary | ICD-10-CM | POA: Diagnosis not present

## 2014-10-07 DIAGNOSIS — S01119A Laceration without foreign body of unspecified eyelid and periocular area, initial encounter: Secondary | ICD-10-CM

## 2014-10-07 DIAGNOSIS — S01111A Laceration without foreign body of right eyelid and periocular area, initial encounter: Secondary | ICD-10-CM | POA: Insufficient documentation

## 2014-10-07 DIAGNOSIS — Z862 Personal history of diseases of the blood and blood-forming organs and certain disorders involving the immune mechanism: Secondary | ICD-10-CM | POA: Insufficient documentation

## 2014-10-07 DIAGNOSIS — I110 Hypertensive heart disease with heart failure: Secondary | ICD-10-CM | POA: Insufficient documentation

## 2014-10-07 DIAGNOSIS — M1990 Unspecified osteoarthritis, unspecified site: Secondary | ICD-10-CM | POA: Diagnosis not present

## 2014-10-07 DIAGNOSIS — W07XXXA Fall from chair, initial encounter: Secondary | ICD-10-CM | POA: Diagnosis not present

## 2014-10-07 DIAGNOSIS — Z87440 Personal history of urinary (tract) infections: Secondary | ICD-10-CM | POA: Insufficient documentation

## 2014-10-07 DIAGNOSIS — Z9889 Other specified postprocedural states: Secondary | ICD-10-CM | POA: Diagnosis not present

## 2014-10-07 DIAGNOSIS — S0990XA Unspecified injury of head, initial encounter: Secondary | ICD-10-CM | POA: Diagnosis not present

## 2014-10-07 DIAGNOSIS — Z8701 Personal history of pneumonia (recurrent): Secondary | ICD-10-CM | POA: Insufficient documentation

## 2014-10-07 DIAGNOSIS — I509 Heart failure, unspecified: Secondary | ICD-10-CM | POA: Insufficient documentation

## 2014-10-07 DIAGNOSIS — I10 Essential (primary) hypertension: Secondary | ICD-10-CM | POA: Diagnosis not present

## 2014-10-07 DIAGNOSIS — E039 Hypothyroidism, unspecified: Secondary | ICD-10-CM | POA: Insufficient documentation

## 2014-10-07 DIAGNOSIS — E780 Pure hypercholesterolemia: Secondary | ICD-10-CM | POA: Insufficient documentation

## 2014-10-07 DIAGNOSIS — Y998 Other external cause status: Secondary | ICD-10-CM | POA: Insufficient documentation

## 2014-10-07 DIAGNOSIS — Z79899 Other long term (current) drug therapy: Secondary | ICD-10-CM | POA: Insufficient documentation

## 2014-10-07 DIAGNOSIS — W19XXXA Unspecified fall, initial encounter: Secondary | ICD-10-CM

## 2014-10-07 DIAGNOSIS — I480 Paroxysmal atrial fibrillation: Secondary | ICD-10-CM | POA: Insufficient documentation

## 2014-10-07 MED ORDER — LIDOCAINE-EPINEPHRINE (PF) 2 %-1:200000 IJ SOLN: 10.0000 mL | Freq: Once | INTRAMUSCULAR | Status: AC

## 2014-10-07 MED ORDER — LIDOCAINE-EPINEPHRINE (PF) 2 %-1:200000 IJ SOLN
INTRAMUSCULAR | Status: AC
  Administered 2014-10-07: 20 mL
  Filled 2014-10-07: qty 20

## 2014-10-07 NOTE — ED Notes (Addendum)
Pt from home c/o falling out of the chair while sleeping and hitting the right side of head. Pt is on coumadin. Pt alert and oriented. Bleeding controlled.

## 2014-10-07 NOTE — ED Provider Notes (Signed)
CSN: [id number]     Arrival date & time 10/07/14  2143 History   First MD Initiated Contact with Patient 10/07/14 2215     Chief Complaint  Patient presents with  . Fall  . Head Laceration     (Consider location/radiation/quality/duration/timing/severity/associated sxs/prior Treatment) Patient is a 81 y.o. female presenting with fall and scalp laceration. The history is provided by the patient, medical records and a relative. No language interpreter was used.  Fall Associated symptoms include headaches. Pertinent negatives include no abdominal pain, chest pain, coughing, diaphoresis, fatigue, fever, nausea, numbness, rash, vomiting or weakness.  Head Laceration Associated symptoms include headaches. Pertinent negatives include no abdominal pain, chest pain, coughing, diaphoresis, fatigue, fever, nausea, numbness, rash, vomiting or weakness.     Desiree [name] is a 81 y.o. female  with a hx of carotid artery disease, osteoarthritis, A. fib, CHF, hypertension, anemia, mitral valve regurgitation presents to the Emergency Department complaining of acute laceration to the right eyebrow approximately 1 hour prior to arrival. Patient reports she fell asleep in her chair and awoke she was falling out of the chair, striking her head on the end table. Patient is anticoagulated on Coumadin and reports compliance with this medication.  She denies no loss of consciousness. Patient immediately called her daughter who reports the patient was alert and oriented. Patient's daughter reports that patient was ambulatory without assistance or ataxia. Patient and daughter report slow movements and chronic pain throughout most of her body due to osteoarthritis. Patient reports associated headache but denies blurred vision, numbness, tingling or weakness in any of her extremities. Nothing seems to make her symptoms better or worse.   Past Medical History  Diagnosis Date  . Diverticulosis     chronic issues with  consitpation and diarrhea  . Carotid artery disease   . Hypercholesterolemia   . Osteoarthritis     chronic-on multiple pain meds.  . Paroxysmal atrial fibrillation     not on coumadin-Cards=[facility]-saw him ?1 yr ago  . CHF (congestive heart failure), NYHA class II     has sudden onset decom CHF? 2/2 to PNA  . Lumbar disc disease   . Hypertensive heart disease without CHF   . Lumbar disc disease   . Heart murmur   . Pneumonia     few times  . Hypothyroidism   . Anemia   . Hypertension   . Mitral regurgitation   . Urinary tract infection     hx of UTI  . Irregular heart beat    Past Surgical History  Procedure Laterality Date  . Carpal tunnel release      rt  . Rotator cuff repair      2 on right, 1 on left  . Abdominal hysterectomy    . Cataract extraction      bil  . Knee surgery      bilateral arthroscopy  . Tee without cardioversion  01/13/2011    Procedure: TRANSESOPHAGEAL ECHOCARDIOGRAM (TEE);  Surgeon: Josue Hector, MD;  Location: [facility];  Service: Cardiovascular;  Laterality: Left;  . Cardiac catheterization  01/14/11  . Mitral valve repair  01/21/2011    Procedure: MITRAL VALVE REPAIR (MVR);  Surgeon: Rexene Alberts, MD;  Location: [facility];  Service: Open Heart Surgery;  Laterality: N/A;  . Chest tube insertion  02/05/2011    Procedure: INSERTION PLEURAL DRAINAGE CATHETER;  Surgeon: Rexene Alberts, MD;  Location: [facility];  Service: Thoracic;  Laterality: Right;  . Removal of  pleural drainage catheter  04/09/2011    Procedure: REMOVAL OF PLEURAL DRAINAGE CATHETER;  Surgeon: Rexene Alberts, MD;  Location: [place];  Service: Thoracic;  Laterality: Right;  . Talc pleurodesis  04/09/2011    Procedure: Pietro Cassis;  Surgeon: Rexene Alberts, MD;  Location: [facility] OR;  Service: Thoracic;  Laterality: Right;  . Fracture surgery  Aug. 2013    Right wrist   . Carotid endarterectomy Right 10-09-03    with resection of redundant CCA  . Eye surgery    . Left and right  heart catheterization with coronary angiogram N/A 01/14/2011    Procedure: LEFT AND RIGHT HEART CATHETERIZATION WITH CORONARY ANGIOGRAM;  Surgeon: Jacolyn Reedy, MD;  Location: [facility] CATH LAB;  Service: Cardiovascular;  Laterality: N/A;   Family History  Problem Relation Age of Onset  . Cancer Mother   . Stroke Father   . Cancer Father   . Diabetes Sister   . Heart disease Sister     Heart Disease before age 76  . Cancer Brother   . Heart disease Sister    Social History  Substance Use Topics  . Smoking status: Never Smoker   . Smokeless tobacco: Never Used  . Alcohol Use: No   OB History    No data available     Review of Systems  Constitutional: Negative for fever, diaphoresis, appetite change, fatigue and unexpected weight change.  HENT: Negative for mouth sores.   Eyes: Negative for visual disturbance.  Respiratory: Negative for cough, chest tightness, shortness of breath and wheezing.   Cardiovascular: Negative for chest pain.  Gastrointestinal: Negative for nausea, vomiting, abdominal pain, diarrhea and constipation.  Endocrine: Negative for polydipsia, polyphagia and polyuria.  Genitourinary: Negative for dysuria, urgency, frequency and hematuria.  Musculoskeletal: Negative for back pain and neck stiffness.  Skin: Positive for wound. Negative for rash.  Allergic/Immunologic: Negative for immunocompromised state.  Neurological: Positive for headaches. Negative for syncope, weakness, light-headedness and numbness.  Hematological: Does not bruise/bleed easily.  Psychiatric/Behavioral: Negative for sleep disturbance. The patient is not nervous/anxious.       Allergies  Celebrex and Diovan  Home Medications   Prior to Admission medications   Medication Sig Start Date End Date Taking? Authorizing Provider  atorvastatin (LIPITOR) 40 MG tablet Take 40 mg by mouth daily at 6 PM.   Yes Historical Provider, MD  BIOTIN FORTE PO Take 1 tablet by mouth every other day.    Yes Historical Provider, MD  cholecalciferol (VITAMIN D) 1000 UNITS tablet Take 1,000 Units by mouth daily.   Yes Historical Provider, MD  fesoterodine (TOVIAZ) 8 MG TB24 tablet Take 8 mg by mouth daily.   Yes Historical Provider, MD  HYDROcodone-acetaminophen (NORCO/VICODIN) 5-325 MG per tablet Take 1-2 tablets by mouth every 6 (six) hours as needed for moderate pain.   Yes Historical Provider, MD  levothyroxine (SYNTHROID, LEVOTHROID) 75 MCG tablet Take 75 mcg by mouth daily.    Yes Historical Provider, MD  metoprolol tartrate (LOPRESSOR) 25 MG tablet Take 25 mg by mouth 2 (two) times daily.    Yes Historical Provider, MD  Polyethyl Glycol-Propyl Glycol (SYSTANE OP) Apply 2 drops to eye 4 (four) times daily as needed. For dry eyes.   Yes Historical Provider, MD  polyethylene glycol (MIRALAX / GLYCOLAX) packet Take 17 g by mouth daily.    Yes Historical Provider, MD  potassium chloride SA (K-DUR,KLOR-CON) 20 MEQ tablet Take 20 mEq by mouth 2 (two) times daily.  Yes Historical Provider, MD  furosemide (LASIX) 40 MG tablet Take 1 tablet (40 mg total) by mouth daily. Patient not taking: Reported on 10/07/2014 02/09/11 02/09/12  John Giovanni, PA-C  warfarin (COUMADIN) 2 MG tablet Take 2-3 mg by mouth daily at 6 PM. On Monday, Wed, and Friday 3 mg , 2 mg all other days. 02/08/11 04/09/14  Donielle Liston Alba, PA-C   BP 162/57 mmHg  Pulse 87  Temp(Src) 97.9 F (36.6 C) (Oral)  Resp 18  SpO2 98% Physical Exam  Constitutional: She is oriented to person, place, and time. She appears well-developed and well-nourished. No distress.  HENT:  Head: Normocephalic.  Nose: Nose normal.  Mouth/Throat: Oropharynx is clear and moist.  2.5 cm laceration to the right eyebrow  Eyes: Conjunctivae and EOM are normal. Pupils are equal, round, and reactive to light. No scleral icterus.  No horizontal, vertical or rotational nystagmus  Neck: Normal range of motion. Neck supple.  Full active and passive ROM without  pain No midline or paraspinal tenderness No nuchal rigidity or meningeal signs  Cardiovascular: Normal rate, regular rhythm and intact distal pulses.   Pulmonary/Chest: Effort normal and breath sounds normal. No respiratory distress. She has no wheezes. She has no rales.  Abdominal: Soft. Bowel sounds are normal. There is no tenderness. There is no rebound and no guarding.  Musculoskeletal: Normal range of motion.  Lymphadenopathy:    She has no cervical adenopathy.  Neurological: She is alert and oriented to person, place, and time. She has normal reflexes. No cranial nerve deficit. She exhibits normal muscle tone. Coordination normal.  Mental Status:  Alert, oriented, thought content appropriate. Speech fluent without evidence of aphasia. Able to follow 2 step commands without difficulty.  Cranial Nerves:  II:  Peripheral visual fields grossly normal, pupils equal, round, reactive to light III,IV, VI: ptosis not present, extra-ocular motions intact bilaterally  V,VII: smile symmetric, facial light touch sensation equal VIII: hearing grossly normal bilaterally  IX,X: midline uvula rise  XI: bilateral shoulder shrug equal and strong XII: midline tongue extension  Motor:  5/5 in upper and lower extremities bilaterally including strong and equal grip strength and dorsiflexion/plantar flexion Sensory: Pinprick and light touch normal in all extremities.  Deep Tendon Reflexes: 2+ and symmetric  Cerebellar: normal finger-to-nose with bilateral upper extremities Gait: normal gait deferred at this time CV: distal pulses palpable throughout   Skin: Skin is warm and dry. No rash noted. She is not diaphoretic.  Psychiatric: She has a normal mood and affect. Her behavior is normal. Judgment and thought content normal.  Nursing note and vitals reviewed.   ED Course  LACERATION REPAIR Date/Time: 10/08/2014 12:53 AM Performed by: Abigail Butts Authorized by: Abigail Butts Consent:  Verbal consent obtained. Risks and benefits: risks, benefits and alternatives were discussed Consent given by: patient Patient understanding: patient states understanding of the procedure being performed Patient consent: the patient's understanding of the procedure matches consent given Procedure consent: procedure consent matches procedure scheduled Relevant documents: relevant documents present and verified Site marked: the operative site was marked Required items: required blood products, implants, devices, and special equipment available Patient identity confirmed: verbally with patient and arm band Time out: Immediately prior to procedure a "time out" was called to verify the correct patient, procedure, equipment, support staff and site/side marked as required. Body area: head/neck Location details: right eyebrow Laceration length: 2.5 cm Foreign bodies: no foreign bodies Tendon involvement: none Nerve involvement: none Vascular damage: no Anesthesia: local  infiltration Local anesthetic: lidocaine 2% with epinephrine Anesthetic total: 3 ml Patient sedated: no Preparation: Patient was prepped and draped in the usual sterile fashion. Irrigation solution: saline Irrigation method: syringe Amount of cleaning: extensive Skin closure: 6-0 Prolene Number of sutures: 3 Technique: simple Approximation: close Approximation difficulty: complex Dressing: 4x4 sterile gauze Patient tolerance: Patient tolerated the procedure well with no immediate complications   (including critical care time) Labs Review Labs Reviewed - No data to display  Imaging Review Ct Head Wo Contrast  10/08/2014   CLINICAL DATA:  Fall with head injury. Coumadin use. Initial encounter.  EXAM: CT HEAD WITHOUT CONTRAST  CT CERVICAL SPINE WITHOUT CONTRAST  TECHNIQUE: Multidetector CT imaging of the head and cervical spine was performed following the standard protocol without intravenous contrast. Multiplanar CT  image reconstructions of the cervical spine were also generated.  COMPARISON:  05/29/2013  FINDINGS: CT HEAD FINDINGS  Skull and Sinuses:Noted remote fracture of the lateral wall right orbit. No acute fracture identified.  Orbits: Cataract resections and senescent globe calcifications. No acute or traumatic findings.  Brain: No evidence of acute infarction, hemorrhage, hydrocephalus, or mass lesion/mass effect.  Generalized cortical atrophy. Moderate chronic small vessel disease with ischemic gliosis confluent around the lateral ventricles. Senescent changes are stable from 2015.  CT CERVICAL SPINE FINDINGS  No evidence of acute fracture or traumatic malalignment.  Degenerative disc narrowing is diffuse and advanced, with uncovertebral spurring causing foraminal stenosis that is most advanced on the left at C5-6 and C3-4. Facet arthropathy is also diffuse and advanced, most notable is C7-T1 where there is anterolisthesis of 3 mm.  No gross cervical canal hematoma or prevertebral edema.  IMPRESSION: No evidence of acute intracranial or cervical spine injury.   Electronically Signed   By: Monte Fantasia M.D.   On: 10/08/2014 00:37   Ct Cervical Spine Wo Contrast  10/08/2014   CLINICAL DATA:  Fall with head injury. Coumadin use. Initial encounter.  EXAM: CT HEAD WITHOUT CONTRAST  CT CERVICAL SPINE WITHOUT CONTRAST  TECHNIQUE: Multidetector CT imaging of the head and cervical spine was performed following the standard protocol without intravenous contrast. Multiplanar CT image reconstructions of the cervical spine were also generated.  COMPARISON:  05/29/2013  FINDINGS: CT HEAD FINDINGS  Skull and Sinuses:Noted remote fracture of the lateral wall right orbit. No acute fracture identified.  Orbits: Cataract resections and senescent globe calcifications. No acute or traumatic findings.  Brain: No evidence of acute infarction, hemorrhage, hydrocephalus, or mass lesion/mass effect.  Generalized cortical atrophy.  Moderate chronic small vessel disease with ischemic gliosis confluent around the lateral ventricles. Senescent changes are stable from 2015.  CT CERVICAL SPINE FINDINGS  No evidence of acute fracture or traumatic malalignment.  Degenerative disc narrowing is diffuse and advanced, with uncovertebral spurring causing foraminal stenosis that is most advanced on the left at C5-6 and C3-4. Facet arthropathy is also diffuse and advanced, most notable is C7-T1 where there is anterolisthesis of 3 mm.  No gross cervical canal hematoma or prevertebral edema.  IMPRESSION: No evidence of acute intracranial or cervical spine injury.   Electronically Signed   By: Monte Fantasia M.D.   On: 10/08/2014 00:37   I, Markita Stcharles, Jarrett Soho, personally reviewed and evaluated these images and lab results as part of my medical decision-making.   EKG Interpretation None      MDM   Final diagnoses:  Fall at home, initial encounter  Laceration of eyebrow and forehead, initial encounter   Desiree [name]  presents after fall from her chair, striking the right side of her for head reading a small laceration. Patient is anticoagulated on Coumadin. Will obtain CT head neck and repair laceration.  12:54 AM Pt remains neurologically intact.  CT without acute abnormality.  Pt ambulates with steady gait.  Pressure irrigation performed. Wound explored and base of wound visualized in a bloodless field without evidence of foreign body.  Laceration occurred < 8 hours prior to repair which was well tolerated. Tdap updated.  Pt has no comorbidities to effect normal wound healing. Pt discharged without antibiotics.  Discussed suture home care with patient and answered questions. Pt to follow-up for wound check and suture removal in 7 days; they are to return to the ED sooner for signs of infection. Pt is hemodynamically stable with no complaints prior to dc.    Jarrett Soho Quandre Polinski, PA-C 10/08/14 [id number]  Merrily Pew, MD 10/09/14 0110

## 2014-10-08 MED ORDER — TETANUS-DIPHTH-ACELL PERTUSSIS 5-2.5-18.5 LF-MCG/0.5 IM SUSP
0.5000 mL | Freq: Once | INTRAMUSCULAR | Status: DC
  Filled 2014-10-08: qty 0.5

## 2014-10-08 MED ORDER — ACETAMINOPHEN 500 MG PO TABS
1000.0000 mg | ORAL_TABLET | Freq: Once | ORAL | Status: DC
  Filled 2014-10-08: qty 2

## 2014-10-08 NOTE — Discharge Instructions (Signed)

## 2014-10-08 NOTE — ED Provider Notes (Signed)
Medical screening examination/treatment/procedure(s) were conducted as a shared visit with non-physician practitioner(s) and myself.  I personally evaluated the patient during the encounter.  Fall from chair when she fell asleep sustaining a laceration to right eyebrow. CT done to evaluate for bleed was negative. Rest of her workup was negative as well. I was present for the laceration repair. Neurologic exam grossly intact. Heart lungs were normal as well. Patient stable for discharge home.   EKG Interpretation None        Merrily Pew, MD 10/09/14 [phone]

## 2014-10-17 ENCOUNTER — Encounter: Payer: Self-pay | Admitting: Neurology

## 2014-10-17 ENCOUNTER — Ambulatory Visit (INDEPENDENT_AMBULATORY_CARE_PROVIDER_SITE_OTHER): Payer: PPO | Admitting: Neurology

## 2014-10-17 VITALS — BP 122/64 | HR 78 | Resp 16 | Wt 123.0 lb

## 2014-10-17 DIAGNOSIS — R413 Other amnesia: Secondary | ICD-10-CM

## 2014-10-17 DIAGNOSIS — R404 Transient alteration of awareness: Secondary | ICD-10-CM | POA: Diagnosis not present

## 2014-10-17 NOTE — Progress Notes (Signed)
NEUROLOGY CONSULTATION NOTE  Desiree [name] MRN: [id number] DOB: Aug 11, 1924  Referring provider: Dr. London Pepper Primary care provider: Dr. London Pepper  Reason for consult:  Memory loss  Dear Dr Orland Mustard:  Thank you for your kind referral of Desiree [name] for consultation of the above symptoms. Although her history is well known to you, please allow me to reiterate it for the purpose of our medical record. The patient was accompanied to the clinic by her daughter who also provides collateral information. Records and images were personally reviewed where available.  HISTORY OF PRESENT ILLNESS: This is an 81 year old right-handed woman with a history of atrial fibrillation, mitral valve repair on chronic anticoagulation with Coumadin, hypertension, hyperlipidemia, right carotid stenosis, chronic back pain, presenting for worsening memory and confusion. The patient feels that she has great memory. She does have problems remembering names or would forget what she went to do, but denies getting lost driving, no missed bill payments or missed medications. She lives by herself. Her daughter wrote a 2-page letter detailing her concerns which I will summarize as follows. She states her mother is not aware or is unwilling to admit she has issues with processing information, simple instructions, short term memory, or compromised judgement. She only agreed to come to today's visit after 2 recent events. The first occurred last 09/24/14 after she got cortisone injections to her knees. That evening, her daughter called her and noted she was very disoriented and confused. She thought it was a different day and was adamant about it. She was fully dressed and had woken up in her bed, which is unusual for her to take naps in her bed. She did not understand why she had done this. She called her 45 minutes later and seemed better. The next incident occurred on 10/06/14 around 45 minutes after they had left her house, she  called and was very upset, confused, reporting bleeding on her face. They came back and found one of her eyeglass lenses on the floor about 5 feet away from her recliner. The frames were bent out of shape. She did not know what had happened. She was brought to the ER where head CT was done which I personally reviewed, showing generalized diffuse atrophy with ventricular dilatation and moderate chronic microvascular disease confluent around the lateral ventricles, stable from 2015. No acute changes seen. She was discharged home back to mental baseline but reporting headaches. Prior to these events, family has noticed difficulty doing tasks that are familiar to her. She does have trouble with new technology, which is not unusual with regards to the iPhone, but her daughter is concerned that she cannot set the timer on her stove despite repeated lessons and detailed illustration. She constantly loses her keys and pocketbook. She repeats the same stories. A few months ago, the patient was very concerned that her bottle of hydrocodone had disappeared. They found the pills in her nightstand but could not find the bottle. She has demonstrated poor judgement in many situations and is very moody and depressed but would not admit to it. She is teary, in constant pain. She is fiercely independent and resilient.   She has occasional headaches and reports she is about to have one right now, on the left side and vertex. She started having brief headaches since a fall a year ago. No associated nausea, vomiting, photo or phonophobia. She has frequent falls, last bad fall was 3 months ago. She reports her balance is not  good. She feels dizzy upon standing, she usually sits and the sensation goes away. She has a different sensation in her feet that she cannot describe, stating "they have never felt like they were my feet since the heart surgery, just strange." She has described pins and needles to her daughter, and states that  lotion can help. She denies any foot pain. She has chronic back pain and has been on hydrocodone for 20 years.She denies any dysarthria, dysphagia, bowel/bladder dysfunction. She does not sleep well. She denies any alcohol intake or significant head injuries. Her younger sister had memory issues. Her brother had seizures. She had a normal birth and early development, no history of CNS infections, febrile seizures, or neurosurgical procedures.   Laboratory Data: Lab Results  Component Value Date   WBC 4.1 04/09/2014   HGB 11.1* 04/09/2014   HCT 34.7* 04/09/2014   MCV 94.9 04/09/2014   PLT 154 04/09/2014     Chemistry      Component Value Date/Time   NA 143 04/09/2014 1347   NA 134* 02/09/2011 1015   K 4.7 04/09/2014 1347   K 3.5 02/09/2011 1015   CL 96 02/09/2011 1015   CO2 27 04/09/2014 1347   CO2 30 02/09/2011 1015   BUN 25.4 04/09/2014 1347   BUN 13 02/09/2011 1015   CREATININE 0.9 04/09/2014 1347   CREATININE 0.89 02/09/2011 1015      Component Value Date/Time   CALCIUM 9.5 04/09/2014 1347   CALCIUM 9.0 02/09/2011 1015   ALKPHOS 77 04/09/2014 1347   ALKPHOS 97 01/19/2011 1241   AST 24 04/09/2014 1347   AST 22 01/19/2011 1241   ALT 21 04/09/2014 1347   ALT 22 01/19/2011 1241   BILITOT 0.40 04/09/2014 1347   BILITOT 0.4 01/19/2011 1241     Lab Results  Component Value Date   TSH 9.986* 02/09/2011   Lab Results  Component Value Date   [id number] 04/09/2014     PAST MEDICAL HISTORY: Past Medical History  Diagnosis Date  . Diverticulosis     chronic issues with consitpation and diarrhea  . Carotid artery disease   . Hypercholesterolemia   . Osteoarthritis     chronic-on multiple pain meds.  . Paroxysmal atrial fibrillation     not on coumadin-Cards=[facility]-saw him ?1 yr ago  . CHF (congestive heart failure), NYHA class II     has sudden onset decom CHF? 2/2 to PNA  . Lumbar disc disease   . Hypertensive heart disease without CHF   . Lumbar disc disease    . Heart murmur   . Pneumonia     few times  . Hypothyroidism   . Anemia   . Hypertension   . Mitral regurgitation   . Urinary tract infection     hx of UTI  . Irregular heart beat     PAST SURGICAL HISTORY: Past Surgical History  Procedure Laterality Date  . Carpal tunnel release      rt  . Rotator cuff repair      2 on right, 1 on left  . Abdominal hysterectomy    . Cataract extraction      bil  . Knee surgery      bilateral arthroscopy  . Tee without cardioversion  01/13/2011    Procedure: TRANSESOPHAGEAL ECHOCARDIOGRAM (TEE);  Surgeon: Josue Hector, MD;  Location: [place];  Service: Cardiovascular;  Laterality: Left;  . Cardiac catheterization  01/14/11  . Mitral valve repair  01/21/2011  Procedure: MITRAL VALVE REPAIR (MVR);  Surgeon: Rexene Alberts, MD;  Location: [place];  Service: Open Heart Surgery;  Laterality: N/A;  . Chest tube insertion  02/05/2011    Procedure: INSERTION PLEURAL DRAINAGE CATHETER;  Surgeon: Rexene Alberts, MD;  Location: [place];  Service: Thoracic;  Laterality: Right;  . Removal of pleural drainage catheter  04/09/2011    Procedure: REMOVAL OF PLEURAL DRAINAGE CATHETER;  Surgeon: Rexene Alberts, MD;  Location: [place];  Service: Thoracic;  Laterality: Right;  . Talc pleurodesis  04/09/2011    Procedure: Pietro Cassis;  Surgeon: Rexene Alberts, MD;  Location: [facility] OR;  Service: Thoracic;  Laterality: Right;  . Fracture surgery  Aug. 2013    Right wrist   . Carotid endarterectomy Right 10-09-03    with resection of redundant CCA  . Eye surgery    . Left and right heart catheterization with coronary angiogram N/A 01/14/2011    Procedure: LEFT AND RIGHT HEART CATHETERIZATION WITH CORONARY ANGIOGRAM;  Surgeon: Jacolyn Reedy, MD;  Location: [facility] CATH LAB;  Service: Cardiovascular;  Laterality: N/A;    MEDICATIONS: Current Outpatient Prescriptions on File Prior to Visit  Medication Sig Dispense Refill  . atorvastatin (LIPITOR) 40 MG  tablet Take 40 mg by mouth daily at 6 PM.    . BIOTIN FORTE PO Take 1 tablet by mouth daily. 1000 mcg    . cholecalciferol (VITAMIN D) 1000 UNITS tablet Take 1,000 Units by mouth daily.    . fesoterodine (TOVIAZ) 8 MG TB24 tablet Take 8 mg by mouth daily.    Marland Kitchen HYDROcodone-acetaminophen (NORCO/VICODIN) 5-325 MG per tablet Take 1-2 tablets by mouth every 6 (six) hours as needed for moderate pain.    Marland Kitchen levothyroxine (SYNTHROID, LEVOTHROID) 75 MCG tablet Take 75 mcg by mouth daily.     . metoprolol tartrate (LOPRESSOR) 25 MG tablet Take 25 mg by mouth 2 (two) times daily.     Vladimir Faster Glycol-Propyl Glycol (SYSTANE OP) Apply 2 drops to eye 4 (four) times daily as needed. For dry eyes.    . polyethylene glycol (MIRALAX / GLYCOLAX) packet Take 17 g by mouth daily.     Marland Kitchen warfarin (COUMADIN) 2 MG tablet Takes 1.5 tablets 4 days a week & 1 tablet 3 days a week    . furosemide (LASIX) 40 MG tablet Take 1 tablet (40 mg total) by mouth daily. (Patient not taking: Reported on 10/17/2014) 30 tablet   . potassium chloride SA (K-DUR,KLOR-CON) 20 MEQ tablet Take 20 mEq by mouth 2 (two) times daily.     No current facility-administered medications on file prior to visit.    ALLERGIES: Allergies  Allergen Reactions  . Celebrex [Celecoxib] Other (See Comments)    Reaction=anemia/leukopenia  . Diovan [Valsartan] Other (See Comments)    Reaction=increase potassium/acute renal failure    FAMILY HISTORY: Family History  Problem Relation Age of Onset  . Cancer Mother   . Stroke Father   . Cancer Father   . Diabetes Sister   . Heart disease Sister     Heart Disease before age 36  . Cancer Brother   . Heart disease Sister     SOCIAL HISTORY: Social History   Social History  . Marital Status: Widowed    Spouse Name: N/A  . Number of Children: 2  . Years of Education: N/A   Occupational History  . Nurse, learning disability [employer]   Social History Main Topics  .  Smoking status: Never  Smoker   . Smokeless tobacco: Never Used  . Alcohol Use: No  . Drug Use: No  . Sexual Activity: Yes    Birth Control/ Protection: Post-menopausal   Other Topics Concern  . Not on file   Social History Narrative   Lives alone   Works full time   Still drives and very active usually    REVIEW OF SYSTEMS: Constitutional: No fevers, chills, or sweats, no generalized fatigue, change in appetite Eyes: No visual changes, double vision, eye pain Ear, nose and throat: No hearing loss, ear pain, nasal congestion, sore throat Cardiovascular: No chest pain, palpitations Respiratory:  No shortness of breath at rest or with exertion, wheezes GastrointestinaI: No nausea, vomiting, diarrhea, abdominal pain, fecal incontinence Genitourinary:  No dysuria, urinary retention or frequency Musculoskeletal:  No neck pain,+ back pain Integumentary: No rash, pruritus, skin lesions Neurological: as above Psychiatric: + depression, insomnia, anxiety Endocrine: No palpitations, fatigue, diaphoresis, mood swings, change in appetite, change in weight, increased thirst Hematologic/Lymphatic:  No anemia, purpura, petechiae. Allergic/Immunologic: no itchy/runny eyes, nasal congestion, recent allergic reactions, rashes  PHYSICAL EXAM: Filed Vitals:   10/17/14 1352  BP: 122/64  Pulse: 78  Resp: 16   General: No acute distress Head:  Normocephalic, bruise under the right eye Eyes: Fundoscopic exam shows bilateral sharp discs, no vessel changes, exudates, or hemorrhages Neck: supple, no paraspinal tenderness, full range of motion Back: No paraspinal tenderness Heart: regular rate and rhythm Lungs: Clear to auscultation bilaterally. Vascular: No carotid bruits. Skin/Extremities: No rash, no edema Neurological Exam: Mental status: alert and oriented to person, place, and time, no dysarthria or aphasia, Fund of knowledge is appropriate.  Recent and remote memory are intact.  Attention and concentration are  normal.    Able to name objects and repeat phrases. Made 3 attempts at clock drawing, third attempt 5/5. MMSE - Mini Mental State Exam 10/18/2014  Orientation to time 5  Orientation to Place 5  Registration 3  Attention/ Calculation 4  Recall 2  Language- name 2 objects 2  Language- repeat 1  Language- follow 3 step command 3  Language- read & follow direction 1  Write a sentence 1  Copy design 1  Total score 28   Cranial nerves: CN I: not tested CN II: pupils equal, round and reactive to light, visual fields intact, fundi unremarkable. CN III, IV, VI:  full range of motion, no nystagmus, no ptosis CN V: facial sensation intact CN VII: upper and lower face symmetric CN VIII: hearing intact to finger rub CN IX, X: gag intact, uvula midline CN XI: sternocleidomastoid and trapezius muscles intact CN XII: tongue midline Bulk & Tone: normal, no fasciculations. Motor: 5/5 throughout with no pronator drift. Sensation: intact to light touch, cold, pin, vibration and joint position sense.  No extinction to double simultaneous stimulation.  Romberg test negative Deep Tendon Reflexes: brisk +2 throughout, no ankle clonus Plantar responses: downgoing bilaterally Cerebellar: no incoordination on finger to nose, heel to shin. No dysdiadochokinesia Gait: narrow-based and steady, able to tandem walk adequately. Tremor: none  IMPRESSION: This is an 81 year old right-handed woman with a history of atrial fibrillation, mitral valve repair on chronic anticoagulation, hypertension, hyperlipidemia, peripheral vascular disease, chronic back pain, presenting for memory loss, difficulties processing information, and 2 distinct confusional episodes. Her MMSE today is normal 28/30, however by family accounts, she does have mild cognitive impairment. CT head shows cortical atrophy and moderate chronic microvascular disease. Fluctuations in  cognition can be seen early in the course of Dementia with Lewy bodies,  episodes can last seconds to days, and interspersed with periods of near-normal function. Less likely TIA and seizure. Routine EEG will be ordered. She is not interested in starting any medication at this point, we discussed continued monitoring of her symptoms, including home and driving safety. She will follow-up in 6 months, consideration for starting cholinesterase inhibitors such as Aricept will be done.   Thank you for allowing me to participate in the care of this patient. Please do not hesitate to call for any questions or concerns.   Ellouise Newer, M.D.  CC: Dr. Orland Mustard

## 2014-10-17 NOTE — Patient Instructions (Addendum)
1. Schedule routine EEG 2. Continue all your medications 3. Monitor home and driving safety with family 4. Follow-up in 6 months, call our office for any changes

## 2014-10-18 ENCOUNTER — Encounter: Payer: Self-pay | Admitting: Neurology

## 2014-10-18 DIAGNOSIS — R413 Other amnesia: Secondary | ICD-10-CM | POA: Insufficient documentation

## 2014-10-18 DIAGNOSIS — R404 Transient alteration of awareness: Secondary | ICD-10-CM | POA: Insufficient documentation

## 2014-11-01 ENCOUNTER — Telehealth: Payer: Self-pay | Admitting: Neurology

## 2014-11-01 NOTE — Telephone Encounter (Signed)
I spoke with Baldo Ash. She states patient had a f/u visit today with her neurosurgeon Dr. Glenna Fellows. She states that he suggested that the patient have some type of test for hydrocephalus and they were told to call you to get the test set-up. I did ask her to call Dr. Rex Kras office and have them fax over her ov note for today for you to review and once the note was reviewed we would be back in contact with her with further advisement.

## 2014-11-01 NOTE — Telephone Encounter (Signed)
Pt daughter [name] called and states that she would like to talk to some one about a test that her mom needs please call [phone]

## 2014-11-05 ENCOUNTER — Ambulatory Visit (INDEPENDENT_AMBULATORY_CARE_PROVIDER_SITE_OTHER): Payer: PPO | Admitting: Neurology

## 2014-11-05 DIAGNOSIS — R404 Transient alteration of awareness: Secondary | ICD-10-CM

## 2014-11-05 DIAGNOSIS — R413 Other amnesia: Secondary | ICD-10-CM | POA: Diagnosis not present

## 2014-11-05 NOTE — Telephone Encounter (Signed)
Patent scheduled for this week.

## 2014-11-05 NOTE — Telephone Encounter (Signed)
Records reviewed, Dr. Carloyn Manner expressed concern about NPH, however unable to do LP to test for gait response due to Coumadin. Schedule f/u visit in office to discuss. THanks

## 2014-11-06 ENCOUNTER — Ambulatory Visit (INDEPENDENT_AMBULATORY_CARE_PROVIDER_SITE_OTHER): Payer: PPO | Admitting: Neurology

## 2014-11-06 ENCOUNTER — Encounter: Payer: Self-pay | Admitting: Neurology

## 2014-11-06 VITALS — BP 122/74 | HR 66 | Resp 16 | Wt 120.0 lb

## 2014-11-06 DIAGNOSIS — R2681 Unsteadiness on feet: Secondary | ICD-10-CM | POA: Diagnosis not present

## 2014-11-06 DIAGNOSIS — R413 Other amnesia: Secondary | ICD-10-CM | POA: Diagnosis not present

## 2014-11-06 NOTE — Progress Notes (Signed)
NEUROLOGY FOLLOW UP OFFICE NOTE  Desiree [name] [id number]  HISTORY OF PRESENT ILLNESS: I had the pleasure of seeing Desiree [name] in follow-up in the neurology clinic on 11/06/2014. She is again accompanied by her daughter who helps supplement the history today. The patient was last seen 3 weeks ago for concern of memory loss. Her MMSE was normal 28/30, by history symptoms suggestive of mild cognitive impairment. She had 2 episodes of confusion. Her routine EEG is normal. Since her last visit, she had seen her neurosurgeon Dr. Carloyn Manner, who expressed concern about normal pressure hydrocephalus. He had reviewed her head CT without contrast and compared it to scan done a year ago, and felt that the ventricles are larger, third ventricle round, and appears as if there was some transependymal migration. She had reported incontinence, and gait was noted to be very slow with short steps. She has been having urinary incontinence for the past 3 years, but states it has worsened last year. She has been seeing Urology and diagnosed with bladder spasms, Lisbeth Ply is helping, she was much worse when she ran out of medication. She has been on Coumadin for mitral valve repair. She denies any headaches currently, no vision changes, focal numbness/tingling/weakness but again reports her feet feel strange. No falls since last visit.  HPI 10/17/14: This is an 81 yo RH woman with a history of atrial fibrillation, mitral valve repair on chronic anticoagulation with Coumadin, hypertension, hyperlipidemia, right carotid stenosis, chronic back pain, who presented for worsening memory and confusion. The patient feels that she has great memory. She does have problems remembering names or would forget what she went to do, but denies getting lost driving, no missed bill payments or missed medications. She lives by herself. Her daughter wrote a 2-page letter detailing her concerns which I will summarize as follows. She states her mother is not  aware or is unwilling to admit she has issues with processing information, simple instructions, short term memory, or compromised judgement. She only agreed to come to today's visit after 2 recent events. The first occurred last 09/24/14 after she got cortisone injections to her knees. That evening, her daughter called her and noted she was very disoriented and confused. She thought it was a different day and was adamant about it. She was fully dressed and had woken up in her bed, which is unusual for her to take naps in her bed. She did not understand why she had done this. She called her 45 minutes later and seemed better. The next incident occurred on 10/06/14 around 45 minutes after they had left her house, she called and was very upset, confused, reporting bleeding on her face. They came back and found one of her eyeglass lenses on the floor about 5 feet away from her recliner. The frames were bent out of shape. She did not know what had happened. She was brought to the ER where head CT was done which I personally reviewed, showing generalized diffuse atrophy with ventricular dilatation and moderate chronic microvascular disease confluent around the lateral ventricles, stable from 2015. No acute changes seen. She was discharged home back to mental baseline but reporting headaches. Prior to these events, family has noticed difficulty doing tasks that are familiar to her. She does have trouble with new technology, which is not unusual with regards to the iPhone, but her daughter is concerned that she cannot set the timer on her stove despite repeated lessons and detailed illustration. She constantly loses her keys  and pocketbook. She repeats the same stories. A few months ago, the patient was very concerned that her bottle of hydrocodone had disappeared. They found the pills in her nightstand but could not find the bottle. She has demonstrated poor judgement in many situations and is very moody and depressed but  would not admit to it. She is teary, in constant pain. She is fiercely independent and resilient. She does not sleep well. She denies any alcohol intake or significant head injuries. Her younger sister had memory issues. Her brother had seizures. She had a normal birth and early development, no history of CNS infections, febrile seizures, or neurosurgical procedures.   PAST MEDICAL HISTORY: Past Medical History  Diagnosis Date  . Diverticulosis     chronic issues with consitpation and diarrhea  . Carotid artery disease   . Hypercholesterolemia   . Osteoarthritis     chronic-on multiple pain meds.  . Paroxysmal atrial fibrillation     not on coumadin-Cards=[facility]-saw him ?1 yr ago  . CHF (congestive heart failure), NYHA class II     has sudden onset decom CHF? 2/2 to PNA  . Lumbar disc disease   . Hypertensive heart disease without CHF   . Lumbar disc disease   . Heart murmur   . Pneumonia     few times  . Hypothyroidism   . Anemia   . Hypertension   . Mitral regurgitation   . Urinary tract infection     hx of UTI  . Irregular heart beat     MEDICATIONS: Current Outpatient Prescriptions on File Prior to Visit  Medication Sig Dispense Refill  . atorvastatin (LIPITOR) 40 MG tablet Take 40 mg by mouth daily at 6 PM.    . BIOTIN FORTE PO Take 1 tablet by mouth daily. 1000 mcg    . cholecalciferol (VITAMIN D) 1000 UNITS tablet Take 1,000 Units by mouth daily.    Marland Kitchen HYDROcodone-acetaminophen (NORCO/VICODIN) 5-325 MG per tablet Take 1-2 tablets by mouth every 6 (six) hours as needed for moderate pain.    Marland Kitchen levothyroxine (SYNTHROID, LEVOTHROID) 75 MCG tablet Take 75 mcg by mouth daily.     . metoprolol tartrate (LOPRESSOR) 25 MG tablet Take 25 mg by mouth 2 (two) times daily.     Vladimir Faster Glycol-Propyl Glycol (SYSTANE OP) Apply 2 drops to eye 4 (four) times daily as needed. For dry eyes.    . polyethylene glycol (MIRALAX / GLYCOLAX) packet Take 17 g by mouth daily.     . potassium  chloride SA (K-DUR,KLOR-CON) 20 MEQ tablet Take 20 mEq by mouth 2 (two) times daily.    Marland Kitchen warfarin (COUMADIN) 2 MG tablet Takes 1.5 tablets 4 days a week & 1 tablet 3 days a week    . fesoterodine (TOVIAZ) 8 MG TB24 tablet Take 8 mg by mouth daily.     No current facility-administered medications on file prior to visit.    ALLERGIES: Allergies  Allergen Reactions  . Celebrex [Celecoxib] Other (See Comments)    Reaction=anemia/leukopenia  . Diovan [Valsartan] Other (See Comments)    Reaction=increase potassium/acute renal failure    FAMILY HISTORY: Family History  Problem Relation Age of Onset  . Cancer Mother   . Stroke Father   . Cancer Father   . Diabetes Sister   . Heart disease Sister     Heart Disease before age 25  . Cancer Brother   . Heart disease Sister     SOCIAL HISTORY: Social History  Social History  . Marital Status: Widowed    Spouse Name: N/A  . Number of Children: 2  . Years of Education: N/A   Occupational History  . Nurse, learning disability [employer]   Social History Main Topics  . Smoking status: Never Smoker   . Smokeless tobacco: Never Used  . Alcohol Use: No  . Drug Use: No  . Sexual Activity: Yes    Birth Control/ Protection: Post-menopausal   Other Topics Concern  . Not on file   Social History Narrative   Lives alone   Works full time   Still drives and very active usually    REVIEW OF SYSTEMS: Constitutional: No fevers, chills, or sweats, no generalized fatigue, change in appetite Eyes: No visual changes, double vision, eye pain Ear, nose and throat: No hearing loss, ear pain, nasal congestion, sore throat Cardiovascular: No chest pain, palpitations Respiratory:  No shortness of breath at rest or with exertion, wheezes GastrointestinaI: No nausea, vomiting, diarrhea, abdominal pain, fecal incontinence Genitourinary:  No dysuria, urinary retention or frequency Musculoskeletal:  + neck pain, back pain Integumentary:  No rash, pruritus, skin lesions Neurological: as above Psychiatric: No depression, insomnia, anxiety Endocrine: No palpitations, fatigue, diaphoresis, mood swings, change in appetite, change in weight, increased thirst Hematologic/Lymphatic:  No anemia, purpura, petechiae. Allergic/Immunologic: no itchy/runny eyes, nasal congestion, recent allergic reactions, rashes  PHYSICAL EXAM: Filed Vitals:   11/06/14 0850  BP: 122/74  Pulse: 66  Resp: 16   General: No acute distress Head:  Normocephalic/atraumatic Neck: supple, no paraspinal tenderness, full range of motion Heart:  Regular rate and rhythm Lungs:  Clear to auscultation bilaterally Back: No paraspinal tenderness Skin/Extremities: No rash, no edema Neurological Exam: alert and oriented to person, place, and time. No aphasia or dysarthria. Fund of knowledge is appropriate.  Recent and remote memory are intact. 2/3 delayed recall. Attention and concentration are normal.  Able to name objects and repeat phrases. Cranial nerves: Pupils equal, round, reactive to light.  Fundoscopic exam unremarkable, no papilledema. Extraocular movements intact with no nystagmus. Visual fields full. Facial sensation intact. No facial asymmetry. Tongue, uvula, palate midline.  Motor: Bulk and tone normal, muscle strength 5/5 throughout with no pronator drift.  Sensation to light touch, temperature and vibration intact.  No extinction to double simultaneous stimulation.  Deep tendon reflexes brisk +2 throughout, toes downgoing.  Finger to nose testing intact.  Gait slow and cautious, favors left knee, takes small steps but does not clearly have magnetic gait  IMPRESSION: This is an 81 yo RH woman with a history of atrial fibrillation, mitral valve repair on chronic anticoagulation, hypertension, hyperlipidemia, peripheral vascular disease, chronic back pain, who presented with memory loss. MMSE normal 28/30, history suggestive of mild cognitive impairment. She  has been dealing with urinary incontinence for the past 3 years, worse in the past year. She has also been having gait difficulties with frequent falls. She does not have the classic magnetic gait, but does have short small steps. Concern for NPH was raised, I discussed head CT with radiology, the third ventricle is noted to be round compared to prior scan, however radiology felt that the size of the ventricles were unchanged and commensurate to degree of atrophy. I discussed with the patient and her daughter the diagnostic tests for NPH, we will have to check if an MRI brain is possible with her mitral valve repair. We discussed that a Lumbar tap test is usually done with assessment of  gait before and after CSF removal, however with her being on Coumadin, I am hesitant about this. We have agreed to proceed with less invasive tests first, Neuropsychological evaluation will be ordered to assess for any frontal-subcortical dysfunction typically seen with NPH. She will be referred to PT for gait assessment. She will follow-up in 2 months and knows to call our office for any changes.   Thank you for allowing me to participate in her care.  Please do not hesitate to call for any questions or concerns.  The duration of this appointment visit was 24 minutes of face-to-face time with the patient.  Greater than 50% of this time was spent in counseling, explanation of diagnosis, planning of further management, and coordination of care.   Ellouise Newer, M.D.   CC: Dr. Orland Mustard, Dr. Carloyn Manner

## 2014-11-06 NOTE — Patient Instructions (Signed)
1. Refer to PT for Gait assessment, question NPH 2. Refer to [facility] for Neurocognitive evaluation 3. Follow-up in 2 months

## 2014-11-07 ENCOUNTER — Encounter: Payer: Self-pay | Admitting: Neurology

## 2014-11-07 DIAGNOSIS — R2681 Unsteadiness on feet: Secondary | ICD-10-CM | POA: Insufficient documentation

## 2014-11-07 NOTE — Procedures (Signed)
ELECTROENCEPHALOGRAM REPORT  Date of Study: 11/05/2014  Patient's Name: Desiree [name] MRN: [id number] Date of Birth: 06-16-1924  Referring Provider: Dr. Ellouise Newer  Clinical History: This is an 81 year old woman with 2 confusional episodes.  Medications: Lipitor, Biotin Forte, Vitamin D, Toviaz, Norco/Vicodin, Lopessor, Lasix, K-Dur/KlorCon, Synthroid  Technical Summary: A multichannel digital EEG recording measured by the international 10-20 system with electrodes applied with paste and impedances below 5000 ohms performed in our laboratory with EKG monitoring in an awake and asleep patient.  Hyperventilation was not performed. Photic stimulation was performed.  The digital EEG was referentially recorded, reformatted, and digitally filtered in a variety of bipolar and referential montages for optimal display.    Description: The patient is awake and asleep during the recording.  During maximal wakefulness, there is a symmetric, medium voltage 9 Hz posterior dominant rhythm that attenuates with eye opening.  The record is symmetric.  During drowsiness and sleep, there is an increase in theta and delta slowing of the background.  Vertex waves and symmetric sleep spindles were seen.  Photic stimulation did not elicit any abnormalities.  The patient was noted to wake up in the middle of the test confused but quickly reoriented. There were no epileptiform discharges or electrographic seizures seen.   EKG lead was unremarkable.  Impression: This awake and asleep EEG is normal.    Clinical Correlation: A normal EEG does not exclude a clinical diagnosis of epilepsy. Clinical correlation is advised.   Ellouise Newer, M.D.

## 2014-11-09 ENCOUNTER — Ambulatory Visit: Payer: PPO | Admitting: Neurology

## 2014-11-21 ENCOUNTER — Emergency Department (HOSPITAL_COMMUNITY): Payer: PPO

## 2014-11-21 ENCOUNTER — Observation Stay (HOSPITAL_COMMUNITY)
Admission: EM | Admit: 2014-11-21 | Discharge: 2014-11-23 | Disposition: A | Discharge status: Home / Self Care | Payer: PPO | Attending: General Surgery | Admitting: General Surgery

## 2014-11-21 ENCOUNTER — Encounter (HOSPITAL_COMMUNITY): Payer: Self-pay

## 2014-11-21 DIAGNOSIS — Z7901 Long term (current) use of anticoagulants: Secondary | ICD-10-CM | POA: Diagnosis not present

## 2014-11-21 DIAGNOSIS — S270XXA Traumatic pneumothorax, initial encounter: Principal | ICD-10-CM | POA: Insufficient documentation

## 2014-11-21 DIAGNOSIS — S20211A Contusion of right front wall of thorax, initial encounter: Secondary | ICD-10-CM | POA: Diagnosis not present

## 2014-11-21 DIAGNOSIS — I119 Hypertensive heart disease without heart failure: Secondary | ICD-10-CM | POA: Diagnosis not present

## 2014-11-21 DIAGNOSIS — M25512 Pain in left shoulder: Secondary | ICD-10-CM | POA: Diagnosis not present

## 2014-11-21 DIAGNOSIS — Z952 Presence of prosthetic heart valve: Secondary | ICD-10-CM | POA: Diagnosis not present

## 2014-11-21 DIAGNOSIS — D62 Acute posthemorrhagic anemia: Secondary | ICD-10-CM | POA: Insufficient documentation

## 2014-11-21 DIAGNOSIS — E785 Hyperlipidemia, unspecified: Secondary | ICD-10-CM | POA: Insufficient documentation

## 2014-11-21 DIAGNOSIS — J939 Pneumothorax, unspecified: Secondary | ICD-10-CM

## 2014-11-21 DIAGNOSIS — E039 Hypothyroidism, unspecified: Secondary | ICD-10-CM | POA: Insufficient documentation

## 2014-11-21 DIAGNOSIS — E780 Pure hypercholesterolemia: Secondary | ICD-10-CM | POA: Diagnosis not present

## 2014-11-21 DIAGNOSIS — I2510 Atherosclerotic heart disease of native coronary artery without angina pectoris: Secondary | ICD-10-CM | POA: Diagnosis not present

## 2014-11-21 DIAGNOSIS — I480 Paroxysmal atrial fibrillation: Secondary | ICD-10-CM | POA: Diagnosis not present

## 2014-11-21 DIAGNOSIS — S2249XA Multiple fractures of ribs, unspecified side, initial encounter for closed fracture: Secondary | ICD-10-CM | POA: Diagnosis present

## 2014-11-21 DIAGNOSIS — S2231XA Fracture of one rib, right side, initial encounter for closed fracture: Secondary | ICD-10-CM | POA: Insufficient documentation

## 2014-11-21 DIAGNOSIS — R079 Chest pain, unspecified: Secondary | ICD-10-CM | POA: Insufficient documentation

## 2014-11-21 DIAGNOSIS — S272XXA Traumatic hemopneumothorax, initial encounter: Secondary | ICD-10-CM

## 2014-11-21 DIAGNOSIS — S2241XA Multiple fractures of ribs, right side, initial encounter for closed fracture: Secondary | ICD-10-CM

## 2014-11-21 HISTORY — DX: Malignant (primary) neoplasm, unspecified: C80.1

## 2014-11-21 LAB — PROTIME-INR
INR: 3.54 — AB (ref 0.00–1.49)
PROTHROMBIN TIME: 34.6 s — AB (ref 11.6–15.2)

## 2014-11-21 LAB — ETHANOL

## 2014-11-21 LAB — I-STAT CHEM 8, ED
BUN: 26 mg/dL — ABNORMAL HIGH (ref 6–20)
CALCIUM ION: 1.18 mmol/L (ref 1.13–1.30)
CHLORIDE: 102 mmol/L (ref 101–111)
Creatinine, Ser: 0.9 mg/dL (ref 0.44–1.00)
GLUCOSE: 87 mg/dL (ref 65–99)
HCT: 43 % (ref 36.0–46.0)
Hemoglobin: 14.6 g/dL (ref 12.0–15.0)
Potassium: 5 mmol/L (ref 3.5–5.1)
SODIUM: 138 mmol/L (ref 135–145)
TCO2: 28 mmol/L (ref 0–100)

## 2014-11-21 LAB — COMPREHENSIVE METABOLIC PANEL
ALBUMIN: 3.5 g/dL (ref 3.5–5.0)
ALK PHOS: 75 U/L (ref 38–126)
ALT: 33 U/L (ref 14–54)
ANION GAP: 7 (ref 5–15)
AST: 44 U/L — ABNORMAL HIGH (ref 15–41)
BILIRUBIN TOTAL: 1.1 mg/dL (ref 0.3–1.2)
BUN: 18 mg/dL (ref 6–20)
CALCIUM: 9.2 mg/dL (ref 8.9–10.3)
CO2: 26 mmol/L (ref 22–32)
Chloride: 105 mmol/L (ref 101–111)
Creatinine, Ser: 0.81 mg/dL (ref 0.44–1.00)
GLUCOSE: 88 mg/dL (ref 65–99)
POTASSIUM: 5.3 mmol/L — AB (ref 3.5–5.1)
Sodium: 138 mmol/L (ref 135–145)
TOTAL PROTEIN: 6.3 g/dL — AB (ref 6.5–8.1)

## 2014-11-21 LAB — CBC
HEMATOCRIT: 35.4 % — AB (ref 36.0–46.0)
HEMOGLOBIN: 11.4 g/dL — AB (ref 12.0–15.0)
MCH: 30.9 pg (ref 26.0–34.0)
MCHC: 32.2 g/dL (ref 30.0–36.0)
MCV: 95.9 fL (ref 78.0–100.0)
Platelets: 145 10*3/uL — ABNORMAL LOW (ref 150–400)
RBC: 3.69 MIL/uL — AB (ref 3.87–5.11)
RDW: 13.3 % (ref 11.5–15.5)
WBC: 3.6 10*3/uL — AB (ref 4.0–10.5)

## 2014-11-21 LAB — SAMPLE TO BLOOD BANK

## 2014-11-21 LAB — CDS SEROLOGY

## 2014-11-21 MED ORDER — IOHEXOL 300 MG/ML  SOLN
80.0000 mL | Freq: Once | INTRAMUSCULAR | Status: AC | PRN
  Administered 2014-11-21: 80 mL via INTRAVENOUS

## 2014-11-21 MED ORDER — HYDROCODONE-ACETAMINOPHEN 5-325 MG PO TABS: 1.0000 | ORAL_TABLET | ORAL | Status: DC | PRN

## 2014-11-21 MED ORDER — HYDROCODONE-ACETAMINOPHEN 5-325 MG PO TABS
2.0000 | ORAL_TABLET | ORAL | Status: DC | PRN
  Administered 2014-11-21 – 2014-11-22 (×2): 2 via ORAL
  Filled 2014-11-21 (×2): qty 2

## 2014-11-21 MED ORDER — SODIUM CHLORIDE 0.9 % IV SOLN
INTRAVENOUS | Status: DC
  Administered 2014-11-21: 22:00:00 via INTRAVENOUS

## 2014-11-21 MED ORDER — LEVOTHYROXINE SODIUM 50 MCG PO TABS
75.0000 ug | ORAL_TABLET | Freq: Every day | ORAL | Status: DC
  Administered 2014-11-22 – 2014-11-23 (×2): 75 ug via ORAL
  Filled 2014-11-21 (×4): qty 1

## 2014-11-21 MED ORDER — FENTANYL CITRATE (PF) 100 MCG/2ML IJ SOLN
50.0000 ug | Freq: Once | INTRAMUSCULAR | Status: DC
  Filled 2014-11-21: qty 2

## 2014-11-21 MED ORDER — ONDANSETRON HCL 4 MG PO TABS: 4.0000 mg | ORAL_TABLET | Freq: Four times a day (QID) | ORAL | Status: DC | PRN

## 2014-11-21 MED ORDER — ONDANSETRON HCL 4 MG/2ML IJ SOLN: 4.0000 mg | Freq: Four times a day (QID) | INTRAMUSCULAR | Status: DC | PRN

## 2014-11-21 MED ORDER — METOPROLOL TARTRATE 25 MG PO TABS
25.0000 mg | ORAL_TABLET | Freq: Two times a day (BID) | ORAL | Status: DC
  Administered 2014-11-21 – 2014-11-23 (×4): 25 mg via ORAL
  Filled 2014-11-21 (×4): qty 1

## 2014-11-21 MED ORDER — HYDROCODONE-ACETAMINOPHEN 5-325 MG PO TABS
1.0000 | ORAL_TABLET | Freq: Once | ORAL | Status: AC
  Administered 2014-11-21: 1 via ORAL
  Filled 2014-11-21: qty 1

## 2014-11-21 MED ORDER — MORPHINE SULFATE (PF) 2 MG/ML IV SOLN: 2.0000 mg | INTRAVENOUS | Status: DC | PRN

## 2014-11-21 MED ORDER — POLYVINYL ALCOHOL 1.4 % OP SOLN
1.0000 [drp] | Freq: Four times a day (QID) | OPHTHALMIC | Status: DC | PRN
  Filled 2014-11-21: qty 15

## 2014-11-21 MED ORDER — POLYETHYL GLYCOL-PROPYL GLYCOL 0.4-0.3 % OP GEL
Freq: Four times a day (QID) | OPHTHALMIC | Status: DC | PRN
  Filled 2014-11-21: qty 10

## 2014-11-21 MED ORDER — PHYTONADIONE 5 MG PO TABS
2.5000 mg | ORAL_TABLET | ORAL | Status: AC
  Administered 2014-11-21: 2.5 mg via ORAL
  Filled 2014-11-21: qty 1

## 2014-11-21 MED ORDER — ATORVASTATIN CALCIUM 40 MG PO TABS
40.0000 mg | ORAL_TABLET | Freq: Every day | ORAL | Status: DC
  Administered 2014-11-21 – 2014-11-22 (×2): 40 mg via ORAL
  Filled 2014-11-21 (×2): qty 1

## 2014-11-21 NOTE — ED Notes (Signed)
The pot does not want fentanyl for pain.  She wants vicodin
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
## 2014-11-21 NOTE — ED Notes (Signed)
Pt alert she reports that she feels ok  c-collar in place

## 2014-11-21 NOTE — ED Notes (Signed)
The pt returned from c-t  Pain med given up to bedside commode
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
## 2014-11-22 ENCOUNTER — Observation Stay (HOSPITAL_COMMUNITY): Payer: PPO

## 2014-11-22 DIAGNOSIS — D62 Acute posthemorrhagic anemia: Secondary | ICD-10-CM | POA: Diagnosis present

## 2014-11-22 LAB — CBC
HEMATOCRIT: 32 % — AB (ref 36.0–46.0)
Hemoglobin: 10.2 g/dL — ABNORMAL LOW (ref 12.0–15.0)
MCH: 30.5 pg (ref 26.0–34.0)
MCHC: 31.9 g/dL (ref 30.0–36.0)
MCV: 95.8 fL (ref 78.0–100.0)
PLATELETS: 120 10*3/uL — AB (ref 150–400)
RBC: 3.34 MIL/uL — ABNORMAL LOW (ref 3.87–5.11)
RDW: 13.4 % (ref 11.5–15.5)
WBC: 3.7 10*3/uL — AB (ref 4.0–10.5)

## 2014-11-22 LAB — BASIC METABOLIC PANEL
ANION GAP: 6 (ref 5–15)
BUN: 12 mg/dL (ref 6–20)
CALCIUM: 9.1 mg/dL (ref 8.9–10.3)
CO2: 27 mmol/L (ref 22–32)
Chloride: 107 mmol/L (ref 101–111)
Creatinine, Ser: 0.68 mg/dL (ref 0.44–1.00)
GFR calc non Af Amer: >60 mL/min (ref >60)
Glucose, Bld: 91 mg/dL (ref 65–99)
Potassium: 3.9 mmol/L (ref 3.5–5.1)
Sodium: 140 mmol/L (ref 135–145)

## 2014-11-22 LAB — PROTIME-INR
INR: 2.79 — AB (ref 0.00–1.49)
Prothrombin Time: 29 seconds — ABNORMAL HIGH (ref 11.6–15.2)

## 2014-11-22 MED ORDER — TRAMADOL HCL 50 MG PO TABS
50.0000 mg | ORAL_TABLET | Freq: Four times a day (QID) | ORAL | Status: DC | PRN
  Administered 2014-11-22: 100 mg via ORAL
  Filled 2014-11-22: qty 2

## 2014-11-22 MED ORDER — WARFARIN SODIUM 5 MG PO TABS
2.5000 mg | ORAL_TABLET | Freq: Every day | ORAL | Status: DC
  Administered 2014-11-22: 2.5 mg via ORAL
  Filled 2014-11-22: qty 1

## 2014-11-22 MED ORDER — WARFARIN - PHYSICIAN DOSING INPATIENT: Freq: Every day | Status: DC

## 2014-11-22 MED ORDER — DARIFENACIN HYDROBROMIDE ER 7.5 MG PO TB24
7.5000 mg | ORAL_TABLET | Freq: Every day | ORAL | Status: DC
  Administered 2014-11-22 – 2014-11-23 (×2): 7.5 mg via ORAL
  Filled 2014-11-22 (×2): qty 1

## 2014-11-22 MED ORDER — MORPHINE SULFATE (PF) 2 MG/ML IV SOLN: 2.0000 mg | INTRAVENOUS | Status: DC | PRN

## 2014-11-22 MED ORDER — POLYETHYLENE GLYCOL 3350 17 G PO PACK
17.0000 g | PACK | Freq: Every day | ORAL | Status: DC
  Administered 2014-11-22: 17 g via ORAL
  Filled 2014-11-22 (×2): qty 1

## 2014-11-22 MED ORDER — DOCUSATE SODIUM 100 MG PO CAPS
100.0000 mg | ORAL_CAPSULE | Freq: Two times a day (BID) | ORAL | Status: DC
  Administered 2014-11-22 (×2): 100 mg via ORAL
  Filled 2014-11-22 (×3): qty 1
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
## 2014-11-23 ENCOUNTER — Observation Stay (HOSPITAL_COMMUNITY): Payer: PPO

## 2014-11-23 LAB — CBC
HCT: 32.5 % — ABNORMAL LOW (ref 36.0–46.0)
HEMOGLOBIN: 10.7 g/dL — AB (ref 12.0–15.0)
MCH: 31.7 pg (ref 26.0–34.0)
MCHC: 32.9 g/dL (ref 30.0–36.0)
MCV: 96.2 fL (ref 78.0–100.0)
Platelets: 123 10*3/uL — ABNORMAL LOW (ref 150–400)
RBC: 3.38 MIL/uL — AB (ref 3.87–5.11)
RDW: 13.3 % (ref 11.5–15.5)
WBC: 3 10*3/uL — AB (ref 4.0–10.5)

## 2014-11-23 LAB — PROTIME-INR
INR: 1.9 — ABNORMAL HIGH (ref 0.00–1.49)
PROTHROMBIN TIME: 21.7 s — AB (ref 11.6–15.2)

## 2014-11-23 MED ORDER — TRAMADOL HCL 50 MG PO TABS: 50.0000 mg | ORAL_TABLET | Freq: Four times a day (QID) | ORAL | Status: DC | PRN
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
## 2014-11-23 NOTE — Discharge Instructions (Signed)
I prescribed a new pain medication (tramadol) while you were in the hospital and it seemed to work for your pain. I like this medication better than the hydrocodone you're on at home. Please feel free to discuss with your primary care provider the pros and cons of changing if it works for you. If not, you can revert to taking the hydrocodone for your pain. You should not take them together.

## 2014-11-23 NOTE — Evaluation (Signed)
  Occupational Therapy Evaluation Patient Details Name: TABATHIA KNOCHE MRN: [id number] DOB: 1924-05-08 Today's Date: December 08, 2014    History of Present Illness     Clinical Impression   Pt admitted to hospital due to reason stated above. Pt currently with functional limitiations due to the deficits listed below (see OT problem list). Prior to admission pt was independent with ADLs, however requires some assistance for housekeeping. Pt currently requires supervision for safety with ADLs engagement. Vision assessment performed pt demonstrated Rt eye deficits with convergence, visual pursuits, occular motion, saccades and visual field. Pt denied education regarding ADLs/IADLs and vision assessment. Pt reports daughter will provide assistance as needed. At this time no further acute needs required, however therapist recommends MD speak with pt regarding driving due to visual deficits.    Follow Up Recommendations       Equipment Recommendations       Recommendations for Other Services       Precautions / Restrictions        Mobility Bed Mobility                  Transfers                      Balance                                            ADL                                               Vision     Perception     Praxis      Pertinent Vitals/Pain       Hand Dominance     Extremity/Trunk Assessment             Communication     Cognition                           General Comments       Exercises       Shoulder Instructions      Home Living                                          Prior Functioning/Environment               OT Diagnosis:     OT Problem List:     OT Treatment/Interventions:      OT Goals(Current goals can be found in the care plan section)    OT Frequency:     Barriers to D/C:            Co-evaluation              End of  Session    Activity Tolerance:   Patient left:     Time:  -    Charges:    G-Codes:    Lin Landsman 12/08/14, 3:36 PM
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
## 2014-12-25 ENCOUNTER — Inpatient Hospital Stay (INDEPENDENT_AMBULATORY_CARE_PROVIDER_SITE_OTHER)
Admission: RE | Admit: 2014-12-25 | Discharge: 2014-12-25 | Disposition: A | Discharge status: Home / Self Care | Payer: PPO | Source: Ambulatory Visit | Attending: Family | Admitting: Family

## 2014-12-25 ENCOUNTER — Other Ambulatory Visit: Payer: Self-pay | Admitting: Family

## 2014-12-25 DIAGNOSIS — I6523 Occlusion and stenosis of bilateral carotid arteries: Secondary | ICD-10-CM

## 2014-12-25 DIAGNOSIS — Z48812 Encounter for surgical aftercare following surgery on the circulatory system: Secondary | ICD-10-CM | POA: Diagnosis not present

## 2014-12-26 ENCOUNTER — Ambulatory Visit (HOSPITAL_COMMUNITY)
Admission: RE | Admit: 2014-12-26 | Discharge: 2014-12-26 | Disposition: A | Discharge status: Home / Self Care | Payer: PPO | Source: Ambulatory Visit | Attending: Vascular Surgery | Admitting: Vascular Surgery

## 2014-12-26 DIAGNOSIS — I10 Essential (primary) hypertension: Secondary | ICD-10-CM | POA: Diagnosis not present

## 2014-12-26 DIAGNOSIS — I6523 Occlusion and stenosis of bilateral carotid arteries: Secondary | ICD-10-CM | POA: Diagnosis present

## 2014-12-26 DIAGNOSIS — I6522 Occlusion and stenosis of left carotid artery: Secondary | ICD-10-CM | POA: Insufficient documentation

## 2014-12-26 DIAGNOSIS — E7800 Pure hypercholesterolemia, unspecified: Secondary | ICD-10-CM | POA: Insufficient documentation

## 2014-12-26 DIAGNOSIS — Z48812 Encounter for surgical aftercare following surgery on the circulatory system: Secondary | ICD-10-CM | POA: Insufficient documentation

## 2014-12-28 ENCOUNTER — Encounter: Payer: Self-pay | Admitting: Family

## 2015-01-01 ENCOUNTER — Encounter: Payer: Self-pay | Admitting: Family

## 2015-01-01 ENCOUNTER — Ambulatory Visit (INDEPENDENT_AMBULATORY_CARE_PROVIDER_SITE_OTHER): Payer: PPO | Admitting: Family

## 2015-01-01 VITALS — BP 141/67 | HR 69 | Ht 64.0 in | Wt 123.5 lb

## 2015-01-01 DIAGNOSIS — Z48812 Encounter for surgical aftercare following surgery on the circulatory system: Secondary | ICD-10-CM | POA: Diagnosis not present

## 2015-01-01 DIAGNOSIS — Z9889 Other specified postprocedural states: Secondary | ICD-10-CM | POA: Diagnosis not present

## 2015-01-01 NOTE — Patient Instructions (Signed)
Stroke Prevention Some medical conditions and behaviors are associated with an increased chance of having a stroke. You may prevent a stroke by making healthy choices and managing medical conditions. HOW CAN I REDUCE MY RISK OF HAVING A STROKE?   Stay physically active. Get at least 30 minutes of activity on most or all days.  Do not smoke. It may also be helpful to avoid exposure to secondhand smoke.  Limit alcohol use. Moderate alcohol use is considered to be:  No more than 2 drinks per day for men.  No more than 1 drink per day for nonpregnant women.  Eat healthy foods. This involves:  Eating 5 or more servings of fruits and vegetables a day.  Making dietary changes that address high blood pressure (hypertension), high cholesterol, diabetes, or obesity.  Manage your cholesterol levels.  Making food choices that are high in fiber and low in saturated fat, trans fat, and cholesterol may control cholesterol levels.  Take any prescribed medicines to control cholesterol as directed by your health care provider.  Manage your diabetes.  Controlling your carbohydrate and sugar intake is recommended to manage diabetes.  Take any prescribed medicines to control diabetes as directed by your health care provider.  Control your hypertension.  Making food choices that are low in salt (sodium), saturated fat, trans fat, and cholesterol is recommended to manage hypertension.  Ask your health care provider if you need treatment to lower your blood pressure. Take any prescribed medicines to control hypertension as directed by your health care provider.  If you are 18-39 years of age, have your blood pressure checked every 3-5 years. If you are 40 years of age or older, have your blood pressure checked every year.  Maintain a healthy weight.  Reducing calorie intake and making food choices that are low in sodium, saturated fat, trans fat, and cholesterol are recommended to manage  weight.  Stop drug abuse.  Avoid taking birth control pills.  Talk to your health care provider about the risks of taking birth control pills if you are over 35 years old, smoke, get migraines, or have ever had a blood clot.  Get evaluated for sleep disorders (sleep apnea).  Talk to your health care provider about getting a sleep evaluation if you snore a lot or have excessive sleepiness.  Take medicines only as directed by your health care provider.  For some people, aspirin or blood thinners (anticoagulants) are helpful in reducing the risk of forming abnormal blood clots that can lead to stroke. If you have the irregular heart rhythm of atrial fibrillation, you should be on a blood thinner unless there is a good reason you cannot take them.  Understand all your medicine instructions.  Make sure that other conditions (such as anemia or atherosclerosis) are addressed. SEEK IMMEDIATE MEDICAL CARE IF:   You have sudden weakness or numbness of the face, arm, or leg, especially on one side of the body.  Your face or eyelid droops to one side.  You have sudden confusion.  You have trouble speaking (aphasia) or understanding.  You have sudden trouble seeing in one or both eyes.  You have sudden trouble walking.  You have dizziness.  You have a loss of balance or coordination.  You have a sudden, severe headache with no known cause.  You have new chest pain or an irregular heartbeat. Any of these symptoms may represent a serious problem that is an emergency. Do not wait to see if the symptoms will   go away. Get medical help at once. Call your local emergency services (911 in [place] Do not drive yourself to the hospital.   This information is not intended to replace advice given to you by your health care provider. Make sure you discuss any questions you have with your health care provider.   Document Released: 03/19/2004 Document Revised: 03/02/2014 Document Reviewed:  08/12/2012 Elsevier Interactive Patient Education 2016 Elsevier Inc.  

## 2015-01-01 NOTE — Progress Notes (Signed)
Established Carotid Patient   History of Present Illness  Desiree [name] is a 81 y.o. female patient of Dr. Donnetta Hutching returns today for follow up who is status post right CEA with resection of redundant CCA in August 2005.  Patient has Negative history of TIA or stroke symptoms. The patient denies amaurosis fugax or monocular blindness. The patient denies facial drooping.  Pt. denies hemiplegia. The patient denies receptive or expressive aphasia. Pt. denies extremity weakness.  She had a mitral valve repair a few years ago, cardiologist is Dr. Wynonia Lawman.  Patient reports an [facility] November 21, 2014. She was treated at [facility], states she had a punctured lung and fractured rib. She was recently diagnosed with vascular cognitive disorder. She walks her dog twice daily, over an hour daily.  She has severe arthritis in her spine, sees Dr. Carloyn Manner, neurosurgeon, who has told her that the pain in her legs is from her back issues.  One leg is shorter than another, this was discovered since she has had a back evaluation. She fell within the last year, does not know how the fall happened.  Pt states her feet have not felt normal since she had a CABG in 2012, "they feel like they are half asleep sometimes", feet do not feel as bad during the day.  Pt Diabetic: No  Pt smoker: non-smoker   Pt meds include:  Statin : Yes  ASA: No  Other anticoagulants/antiplatelets: coumadin, taking since mitral valve repair    Past Medical History  Diagnosis Date  . Diverticulosis     chronic issues with consitpation and diarrhea  . Carotid artery disease   . Hypercholesterolemia   . Osteoarthritis     chronic-on multiple pain meds.  . Paroxysmal atrial fibrillation     not on coumadin-Cards=Tilley-saw him ?1 yr ago  . CHF (congestive heart failure), NYHA class II     has sudden onset decom CHF? 2/2 to PNA  . Lumbar disc disease   . Hypertensive heart disease without CHF   . Lumbar disc disease   . Heart  murmur   . Pneumonia     few times  . Hypothyroidism   . Anemia   . Hypertension   . Mitral regurgitation   . Urinary tract infection     hx of UTI  . Irregular heart beat   . Cancer     skin cancer removed from top of head.per pt    Social History Social History  Substance Use Topics  . Smoking status: Never Smoker   . Smokeless tobacco: Never Used  . Alcohol Use: No    Family History Family History  Problem Relation Age of Onset  . Cancer Mother   . Stroke Father   . Cancer Father   . Diabetes Sister   . Heart disease Sister     Heart Disease before age 104  . Cancer Brother   . Heart disease Sister     Surgical History Past Surgical History  Procedure Laterality Date  . Carpal tunnel release      rt  . Rotator cuff repair      2 on right, 1 on left  . Abdominal hysterectomy    . Cataract extraction      bil  . Knee surgery      bilateral arthroscopy  . Tee without cardioversion  01/13/2011    Procedure: TRANSESOPHAGEAL ECHOCARDIOGRAM (TEE);  Surgeon: Josue Hector, MD;  Location: [place];  Service: Cardiovascular;  Laterality:  Left;  . Cardiac catheterization  01/14/11  . Mitral valve repair  01/21/2011    Procedure: MITRAL VALVE REPAIR (MVR);  Surgeon: Rexene Alberts, MD;  Location: [place];  Service: Open Heart Surgery;  Laterality: N/A;  . Chest tube insertion  02/05/2011    Procedure: INSERTION PLEURAL DRAINAGE CATHETER;  Surgeon: Rexene Alberts, MD;  Location: [place];  Service: Thoracic;  Laterality: Right;  . Removal of pleural drainage catheter  04/09/2011    Procedure: REMOVAL OF PLEURAL DRAINAGE CATHETER;  Surgeon: Rexene Alberts, MD;  Location: [place];  Service: Thoracic;  Laterality: Right;  . Talc pleurodesis  04/09/2011    Procedure: Pietro Cassis;  Surgeon: Rexene Alberts, MD;  Location: [facility] OR;  Service: Thoracic;  Laterality: Right;  . Fracture surgery  Aug. 2013    Right wrist   . Carotid endarterectomy Right 10-09-03    with  resection of redundant CCA  . Eye surgery    . Left and right heart catheterization with coronary angiogram N/A 01/14/2011    Procedure: LEFT AND RIGHT HEART CATHETERIZATION WITH CORONARY ANGIOGRAM;  Surgeon: Jacolyn Reedy, MD;  Location: [facility] CATH LAB;  Service: Cardiovascular;  Laterality: N/A;    Allergies  Allergen Reactions  . Celebrex [Celecoxib] Other (See Comments)    Reaction=anemia/leukopenia  . Diovan [Valsartan] Other (See Comments)    Reaction=increase potassium/acute renal failure    Current Outpatient Prescriptions  Medication Sig Dispense Refill  . atorvastatin (LIPITOR) 40 MG tablet Take 40 mg by mouth daily at 6 PM.    . cholecalciferol (VITAMIN D) 1000 UNITS tablet Take 1,000 Units by mouth daily.    Marland Kitchen levothyroxine (SYNTHROID, LEVOTHROID) 75 MCG tablet Take 75 mcg by mouth daily.     . metoprolol tartrate (LOPRESSOR) 25 MG tablet Take 25 mg by mouth 2 (two) times daily.     Vladimir Faster Glycol-Propyl Glycol (SYSTANE OP) Apply 2 drops to eye 4 (four) times daily as needed. For dry eyes.    . polyethylene glycol (MIRALAX / GLYCOLAX) packet Take 17 g by mouth every other day.     . solifenacin (VESICARE) 5 MG tablet Take 5 mg by mouth daily.    . traMADol (ULTRAM) 50 MG tablet Take 1-2 tablets (50-100 mg total) by mouth every 6 (six) hours as needed (Pain). 100 tablet 0  . warfarin (COUMADIN) 2.5 MG tablet Take 2.5 mg by mouth daily at 6 PM.     No current facility-administered medications for this visit.    Review of Systems : See HPI for pertinent positives and negatives.  Physical Examination  Filed Vitals:   01/01/15 1544 01/01/15 1546  BP: 146/70 141/67  Pulse: 69   Height: 5\' 4"  (1.626 m)   Weight: 123 lb 8 oz (56.019 kg)   SpO2: 99%    Body mass index is 21.19 kg/(m^2).   General: WDWN female in NAD  GAIT: slow and deliberate  Eyes: PERRLA  Pulmonary: CTAB, no rales,  rhonchi, or wheezing.  Cardiac: Regular rhythm, mitral valve click  detected, murmur appreciated.   VASCULAR EXAM  Carotid Bruits  Left  Right    Positive  positive  Aorta is not palpable.  Radial pulses are 2+ palpable and equal.  LE Pulses  LEFT  RIGHT   FEMORAL 2+ 1+  POPLITEAL  not palpable  not palpable   POSTERIOR TIBIAL  not palpable  not palpable   DORSALIS PEDIS  ANTERIOR TIBIAL  not palpable  faintlypalpable  Gastrointestinal: soft, nontender, BS WNL, no r/g, no palpated masses.  Musculoskeletal: Negative muscle atrophy/wasting. M/S 3/5 throughout, Extremities without ischemic changes.  Neurologic: A&O X 3; Appropriate Affect, Speech is normal  CN 2-12 intact, Pain and light touch intact in extremities, Motor exam as listed above.         Non-Invasive Vascular Imaging CAROTID DUPLEX 12/26/2014   CEREBROVASCULAR DUPLEX EVALUATION     INDICATION: Carotid artery disease    PREVIOUS INTERVENTION(S): Right carotid endarterectomy with redundant common carotid artery  resection  2005    DUPLEX EXAM: Carotid duplex    RIGHT  LEFT  Peak Systolic Velocities (cm/s) End Diastolic Velocities (cm/s) Plaque LOCATION Peak Systolic Velocities (cm/s) End Diastolic Velocities (cm/s) Plaque  71 11 - CCA PROXIMAL 86 14 HT  94 14 - CCA MID 84 15 HM  127 23 HT CCA DISTAL 100 21 HT  149 25 HT ECA 384 63 HT  112 19 HT ICA PROXIMAL 94 21 CP  89 22 HM ICA MID 81 20 -  81 18 - ICA DISTAL 83 24 -    N/A ICA / CCA Ratio (PSV) 1.1  Antegrade Vertebral Flow Antegrade  - Brachial Systolic Pressure (mmHg) -  Triphasic Brachial Artery Waveforms Triphasic    Plaque Morphology:  HM = Homogeneous, HT = Heterogeneous, CP = Calcific Plaque, SP = Smooth Plaque, IP = Irregular Plaque     ADDITIONAL FINDINGS:     IMPRESSION: 1. Patent right carotid endarterectomy site with no evidence of right internal carotid artery stenosis 2. Less than 40% left internal carotid artery stenosis. 3. Left external carotid artery stenosis.     Compared to the previous exam:  No significant change since exam of 12/19/2013      Assessment: VADIS SLABACH is a 81 y.o. female who is status post right CEA with resection of redundant CCA in August 2005.  She has no history of stroke or TIA. Today's carotid duplex suggests a patent right carotid endarterectomy site with no evidence of right internal carotid artery stenosis and less than 40% left internal carotid artery stenosis.No significant change since exam of 12/19/2013.    Plan: Follow-up in 1 year with Carotid Duplex scan.   I discussed in depth with the patient the nature of atherosclerosis, and emphasized the importance of maximal medical management including strict control of blood pressure, blood glucose, and lipid levels, obtaining regular exercise, and continued cessation of smoking.  The patient is aware that without maximal medical management the underlying atherosclerotic disease process will progress, limiting the benefit of any interventions. The patient was given information about stroke prevention and what symptoms should prompt the patient to seek immediate medical care. Thank you for allowing [place] to participate in this patient's care.  Clemon Chambers, RN, MSN, FNP-C [facility] Office: [phone]  Clinic Physician: Early  01/01/2015 3:22 PM

## 2015-01-07 ENCOUNTER — Encounter: Payer: Self-pay | Admitting: Neurology

## 2015-01-07 ENCOUNTER — Ambulatory Visit (INDEPENDENT_AMBULATORY_CARE_PROVIDER_SITE_OTHER): Payer: PPO | Admitting: Neurology

## 2015-01-07 VITALS — BP 122/68 | HR 75 | Resp 14 | Wt 122.0 lb

## 2015-01-07 DIAGNOSIS — F09 Unspecified mental disorder due to known physiological condition: Secondary | ICD-10-CM | POA: Diagnosis not present

## 2015-01-07 DIAGNOSIS — F0150 Vascular dementia without behavioral disturbance: Secondary | ICD-10-CM | POA: Insufficient documentation

## 2015-01-07 DIAGNOSIS — I999 Unspecified disorder of circulatory system: Secondary | ICD-10-CM | POA: Diagnosis not present

## 2015-01-07 DIAGNOSIS — F01A0 Vascular dementia, mild, without behavioral disturbance, psychotic disturbance, mood disturbance, and anxiety: Secondary | ICD-10-CM | POA: Insufficient documentation

## 2015-01-07 MED ORDER — RIVASTIGMINE 4.6 MG/24HR TD PT24: 4.6000 mg | MEDICATED_PATCH | Freq: Every day | TRANSDERMAL | Status: DC

## 2015-01-07 NOTE — Patient Instructions (Addendum)
1. Start Exelon patch 4.6mg : Apply patch to skin every 24 hours 2. Recommendation is no driving 3. Follow-up in 3 months, call for any problems
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
## 2015-03-01 DIAGNOSIS — I480 Paroxysmal atrial fibrillation: Secondary | ICD-10-CM | POA: Diagnosis not present

## 2015-03-01 DIAGNOSIS — E784 Other hyperlipidemia: Secondary | ICD-10-CM | POA: Diagnosis not present

## 2015-03-01 DIAGNOSIS — I6529 Occlusion and stenosis of unspecified carotid artery: Secondary | ICD-10-CM | POA: Diagnosis not present

## 2015-03-01 DIAGNOSIS — Z954 Presence of other heart-valve replacement: Secondary | ICD-10-CM | POA: Diagnosis not present

## 2015-03-01 DIAGNOSIS — Z7901 Long term (current) use of anticoagulants: Secondary | ICD-10-CM | POA: Diagnosis not present

## 2015-03-01 DIAGNOSIS — I119 Hypertensive heart disease without heart failure: Secondary | ICD-10-CM | POA: Diagnosis not present

## 2015-03-01 DIAGNOSIS — I739 Peripheral vascular disease, unspecified: Secondary | ICD-10-CM | POA: Diagnosis not present

## 2015-03-11 DIAGNOSIS — M549 Dorsalgia, unspecified: Secondary | ICD-10-CM | POA: Diagnosis not present

## 2015-03-11 DIAGNOSIS — I10 Essential (primary) hypertension: Secondary | ICD-10-CM | POA: Diagnosis not present

## 2015-03-11 DIAGNOSIS — D649 Anemia, unspecified: Secondary | ICD-10-CM | POA: Diagnosis not present

## 2015-03-11 DIAGNOSIS — M130 Polyarthritis, unspecified: Secondary | ICD-10-CM | POA: Diagnosis not present

## 2015-03-11 DIAGNOSIS — E039 Hypothyroidism, unspecified: Secondary | ICD-10-CM | POA: Diagnosis not present

## 2015-03-11 DIAGNOSIS — S81802A Unspecified open wound, left lower leg, initial encounter: Secondary | ICD-10-CM | POA: Diagnosis not present

## 2015-03-11 DIAGNOSIS — E785 Hyperlipidemia, unspecified: Secondary | ICD-10-CM | POA: Diagnosis not present

## 2015-03-22 DIAGNOSIS — I739 Peripheral vascular disease, unspecified: Secondary | ICD-10-CM | POA: Diagnosis not present

## 2015-03-22 DIAGNOSIS — Z7901 Long term (current) use of anticoagulants: Secondary | ICD-10-CM | POA: Diagnosis not present

## 2015-03-22 DIAGNOSIS — L03116 Cellulitis of left lower limb: Secondary | ICD-10-CM | POA: Diagnosis not present

## 2015-03-22 DIAGNOSIS — I6529 Occlusion and stenosis of unspecified carotid artery: Secondary | ICD-10-CM | POA: Diagnosis not present

## 2015-03-22 DIAGNOSIS — I480 Paroxysmal atrial fibrillation: Secondary | ICD-10-CM | POA: Diagnosis not present

## 2015-03-22 DIAGNOSIS — Z954 Presence of other heart-valve replacement: Secondary | ICD-10-CM | POA: Diagnosis not present

## 2015-03-22 DIAGNOSIS — I119 Hypertensive heart disease without heart failure: Secondary | ICD-10-CM | POA: Diagnosis not present

## 2015-03-22 DIAGNOSIS — E784 Other hyperlipidemia: Secondary | ICD-10-CM | POA: Diagnosis not present

## 2015-03-28 DIAGNOSIS — H43823 Vitreomacular adhesion, bilateral: Secondary | ICD-10-CM | POA: Diagnosis not present

## 2015-03-29 DIAGNOSIS — Z0000 Encounter for general adult medical examination without abnormal findings: Secondary | ICD-10-CM | POA: Diagnosis not present

## 2015-03-29 DIAGNOSIS — N390 Urinary tract infection, site not specified: Secondary | ICD-10-CM | POA: Diagnosis not present

## 2015-03-29 DIAGNOSIS — N3941 Urge incontinence: Secondary | ICD-10-CM | POA: Diagnosis not present

## 2015-04-17 ENCOUNTER — Ambulatory Visit: Payer: PPO | Admitting: Podiatry

## 2015-04-17 DIAGNOSIS — E784 Other hyperlipidemia: Secondary | ICD-10-CM | POA: Diagnosis not present

## 2015-04-17 DIAGNOSIS — B9620 Unspecified Escherichia coli [E. coli] as the cause of diseases classified elsewhere: Secondary | ICD-10-CM | POA: Diagnosis not present

## 2015-04-17 DIAGNOSIS — N390 Urinary tract infection, site not specified: Secondary | ICD-10-CM | POA: Diagnosis not present

## 2015-04-17 DIAGNOSIS — Z7901 Long term (current) use of anticoagulants: Secondary | ICD-10-CM | POA: Diagnosis not present

## 2015-04-17 DIAGNOSIS — I739 Peripheral vascular disease, unspecified: Secondary | ICD-10-CM | POA: Diagnosis not present

## 2015-04-17 DIAGNOSIS — I480 Paroxysmal atrial fibrillation: Secondary | ICD-10-CM | POA: Diagnosis not present

## 2015-04-17 DIAGNOSIS — L03116 Cellulitis of left lower limb: Secondary | ICD-10-CM | POA: Diagnosis not present

## 2015-04-17 DIAGNOSIS — N3941 Urge incontinence: Secondary | ICD-10-CM | POA: Diagnosis not present

## 2015-04-17 DIAGNOSIS — I6529 Occlusion and stenosis of unspecified carotid artery: Secondary | ICD-10-CM | POA: Diagnosis not present

## 2015-04-17 DIAGNOSIS — Z954 Presence of other heart-valve replacement: Secondary | ICD-10-CM | POA: Diagnosis not present

## 2015-04-17 DIAGNOSIS — I119 Hypertensive heart disease without heart failure: Secondary | ICD-10-CM | POA: Diagnosis not present

## 2015-04-17 DIAGNOSIS — Z0000 Encounter for general adult medical examination without abnormal findings: Secondary | ICD-10-CM | POA: Diagnosis not present

## 2015-04-19 ENCOUNTER — Ambulatory Visit (INDEPENDENT_AMBULATORY_CARE_PROVIDER_SITE_OTHER): Payer: PPO | Admitting: Neurology

## 2015-04-19 ENCOUNTER — Encounter: Payer: Self-pay | Admitting: Neurology

## 2015-04-19 VITALS — BP 120/58 | HR 84 | Ht 59.0 in | Wt 123.0 lb

## 2015-04-19 DIAGNOSIS — F01A0 Vascular dementia, mild, without behavioral disturbance, psychotic disturbance, mood disturbance, and anxiety: Secondary | ICD-10-CM

## 2015-04-19 DIAGNOSIS — F0150 Vascular dementia without behavioral disturbance: Secondary | ICD-10-CM

## 2015-04-19 DIAGNOSIS — F09 Unspecified mental disorder due to known physiological condition: Secondary | ICD-10-CM

## 2015-04-19 DIAGNOSIS — I999 Unspecified disorder of circulatory system: Secondary | ICD-10-CM | POA: Diagnosis not present

## 2015-04-19 MED ORDER — RIVASTIGMINE 9.5 MG/24HR TD PT24: 9.5000 mg | MEDICATED_PATCH | Freq: Every day | TRANSDERMAL | Status: DC

## 2015-04-19 NOTE — Patient Instructions (Signed)
1. Start taking new prescription for Exelon 9.6mg /24hr once you finish with current prescription 2. Continue to monitor mood, discuss with PCP if needed 3. Physical exercise and brain stimulation exercises are important for brain health 4. Follow-up in 1 year

## 2015-04-19 NOTE — Progress Notes (Signed)
NEUROLOGY FOLLOW UP OFFICE NOTE  Desiree [name] [id number]  HISTORY OF PRESENT ILLNESS: I had the pleasure of seeing Desiree [name] in follow-up in the neurology clinic on 04/19/2015. She is accompanied by her 2 daughters who helps supplement the history today. On her last visit, we had discussed Neuropsychological evaluation indicating Vascular Dementia. She was started on an Exelon patch but states she does not use this daily. She continues to have difficulty accepting the diagnosis and became tearful again in the office today. Her family reports mood issues, she gets angry easily. She continues to live alone, her daughter comes frequently to check on her. She denies any headaches, dizziness, diplopia, focal numbness/tingling/weakness, no falls. She is not driving.  HPI 10/17/14: This is an 82 yo RH woman with a history of atrial fibrillation, mitral valve repair on chronic anticoagulation with Coumadin, hypertension, hyperlipidemia, right carotid stenosis, chronic back pain, who presented for worsening memory and confusion. The patient feels that she has great memory. She does have problems remembering names or would forget what she went to do, but denies getting lost driving, no missed bill payments or missed medications. She lives by herself. Her daughter wrote a 2-page letter detailing her concerns which I will summarize as follows. She states her mother is not aware or is unwilling to admit she has issues with processing information, simple instructions, short term memory, or compromised judgement. She only agreed to come to today's visit after 2 recent events. The first occurred last 09/24/14 after she got cortisone injections to her knees. That evening, her daughter called her and noted she was very disoriented and confused. She thought it was a different day and was adamant about it. She was fully dressed and had woken up in her bed, which is unusual for her to take naps in her bed. She did not  understand why she had done this. She called her 45 minutes later and seemed better. The next incident occurred on 10/06/14 around 45 minutes after they had left her house, she called and was very upset, confused, reporting bleeding on her face. They came back and found one of her eyeglass lenses on the floor about 5 feet away from her recliner. The frames were bent out of shape. She did not know what had happened. She was brought to the ER where head CT was done which I personally reviewed, showing generalized diffuse atrophy with ventricular dilatation and moderate chronic microvascular disease confluent around the lateral ventricles, stable from 2015. No acute changes seen. She was discharged home back to mental baseline but reporting headaches. Prior to these events, family has noticed difficulty doing tasks that are familiar to her. She does have trouble with new technology, which is not unusual with regards to the iPhone, but her daughter is concerned that she cannot set the timer on her stove despite repeated lessons and detailed illustration. She constantly loses her keys and pocketbook. She repeats the same stories. A few months ago, the patient was very concerned that her bottle of hydrocodone had disappeared. They found the pills in her nightstand but could not find the bottle. She has demonstrated poor judgement in many situations and is very moody and depressed but would not admit to it. She is teary, in constant pain. She is fiercely independent and resilient. She does not sleep well. She denies any alcohol intake or significant head injuries. Her younger sister had memory issues. Her brother had seizures. She had a normal birth and early  development, no history of CNS infections, febrile seizures, or neurosurgical procedures.   Diagnostic Data: Neuropsychological evaluation at [facility]: Overall, her neurocognitive profile was felt to be functionally consistent with a diagnosis of Major Vascular  Neurocognitive Disorder. It was felt that NPH was unlikely, as well as Dementia with Lewy Bodies. Given the cerebrovascular changes and ischemia on neuroimaging, vascular disease seems a more probable etiology. Recommendation was consideration for nootropics to help with cognitive efficiency, as well as refraining from driving. Certain memory strategies were given.   PAST MEDICAL HISTORY: Past Medical History  Diagnosis Date  . Diverticulosis     chronic issues with consitpation and diarrhea  . Carotid artery disease ([place])   . Hypercholesterolemia   . Osteoarthritis     chronic-on multiple pain meds.  . Paroxysmal atrial fibrillation (HCC)     not on coumadin-Cards=[facility]-saw him ?1 yr ago  . CHF (congestive heart failure), NYHA class II (HCC)     has sudden onset decom CHF? 2/2 to PNA  . Lumbar disc disease   . Hypertensive heart disease without CHF   . Lumbar disc disease   . Heart murmur   . Pneumonia     few times  . Hypothyroidism   . Anemia   . Hypertension   . Mitral regurgitation   . Urinary tract infection     hx of UTI  . Irregular heart beat   . Cancer ([place])     skin cancer removed from top of head.per pt    MEDICATIONS: Current Outpatient Prescriptions on File Prior to Visit  Medication Sig Dispense Refill  . atorvastatin (LIPITOR) 40 MG tablet Take 40 mg by mouth daily at 6 PM.    . cholecalciferol (VITAMIN D) 1000 UNITS tablet Take 1,000 Units by mouth daily.    Marland Kitchen HYDROcodone-acetaminophen (NORCO/VICODIN) 5-325 MG tablet Takes 2 tablets every 6 hours  0  . levothyroxine (SYNTHROID, LEVOTHROID) 75 MCG tablet Take 75 mcg by mouth daily.     . metoprolol tartrate (LOPRESSOR) 25 MG tablet Take 25 mg by mouth 2 (two) times daily.     Vladimir Faster Glycol-Propyl Glycol (SYSTANE OP) Apply 2 drops to eye 4 (four) times daily as needed. For dry eyes.    . polyethylene glycol (MIRALAX / GLYCOLAX) packet Take 17 g by mouth every other day.     . rivastigmine (EXELON) 4.6  mg/24hr Place 1 patch (4.6 mg total) onto the skin daily. 30 patch 12  . solifenacin (VESICARE) 5 MG tablet Take 5 mg by mouth daily.    . TOVIAZ 8 MG TB24 tablet 8 mg. Take 1 tablet daily  1  . warfarin (COUMADIN) 2.5 MG tablet Take 2.5 mg by mouth daily at 6 PM.     No current facility-administered medications on file prior to visit.    ALLERGIES: Allergies  Allergen Reactions  . Celebrex [Celecoxib] Other (See Comments)    Reaction=anemia/leukopenia  . Diovan [Valsartan] Other (See Comments)    Reaction=increase potassium/acute renal failure    FAMILY HISTORY: Family History  Problem Relation Age of Onset  . Cancer Mother   . Stroke Father   . Cancer Father   . Diabetes Sister   . Heart disease Sister     Heart Disease before age 9  . Cancer Brother   . Heart disease Sister     SOCIAL HISTORY: Social History   Social History  . Marital Status: Widowed    Spouse Name: N/A  . Number of Children:  2  . Years of Education: N/A   Occupational History  . Nurse, learning disability [employer]   Social History Main Topics  . Smoking status: Never Smoker   . Smokeless tobacco: Never Used  . Alcohol Use: No  . Drug Use: No  . Sexual Activity: Yes    Birth Control/ Protection: Post-menopausal   Other Topics Concern  . Not on file   Social History Narrative   Lives alone   Works full time   Still drives and very active usually    REVIEW OF SYSTEMS: Constitutional: No fevers, chills, or sweats, no generalized fatigue, change in appetite Eyes: No visual changes, double vision, eye pain Ear, nose and throat: No hearing loss, ear pain, nasal congestion, sore throat Cardiovascular: No chest pain, palpitations Respiratory:  No shortness of breath at rest or with exertion, wheezes GastrointestinaI: No nausea, vomiting, diarrhea, abdominal pain, fecal incontinence Genitourinary:  No dysuria, urinary retention or frequency Musculoskeletal:  + neck pain, back  pain Integumentary: No rash, pruritus, skin lesions Neurological: as above Psychiatric: No depression, insomnia, anxiety Endocrine: No palpitations, fatigue, diaphoresis, mood swings, change in appetite, change in weight, increased thirst Hematologic/Lymphatic:  No anemia, purpura, petechiae. Allergic/Immunologic: no itchy/runny eyes, nasal congestion, recent allergic reactions, rashes  PHYSICAL EXAM: Filed Vitals:   04/19/15 1510  BP: 120/58  Pulse: 84   General: No acute distress, became tearful during the visit Head:  Normocephalic/atraumatic Neck: supple, no paraspinal tenderness, full range of motion Heart:  Regular rate and rhythm Lungs:  Clear to auscultation bilaterally Back: No paraspinal tenderness Skin/Extremities: No rash, no edema Neurological Exam: alert and oriented to person, place, and time. No aphasia or dysarthria. Fund of knowledge is appropriate.  Recent and remote memory are intact. 2/3 delayed recall. Attention and concentration are normal.  Able to name objects and repeat phrases. Cranial nerves: Pupils equal, round, reactive to light. Extraocular movements intact with no nystagmus. Visual fields full. Facial sensation intact. No facial asymmetry. Tongue, uvula, palate midline.  Motor: Bulk and tone normal, muscle strength 5/5 throughout with no pronator drift.  Sensation to light touch, temperature and vibration intact.  No extinction to double simultaneous stimulation.  Deep tendon reflexes brisk +2 throughout, toes downgoing.  Finger to nose testing intact.  Gait slow and cautious, favors left knee (similar to prior).  IMPRESSION: This is an 82 yo RH woman with a history of atrial fibrillation, mitral valve repair on chronic anticoagulation, hypertension, hyperlipidemia, peripheral vascular disease, chronic back pain, who presented with memory loss. She underwent Neuropsychological evaluation indicating a diagnosis of Major Vascular Neurocognitive Disorder (vascular  dementia). MMSE during their evaluation was 20/30. She was started on the Exelon patch which she is overall tolerating but not using as instructed. She will increase to 9.6mg /24hr patch daily. We again discussed the diagnosis of mild dementia, she again became upset and tearful. We discussed expectations from the Exelon patch. We also discussed effects of mood on memory, she will discuss this with her PCP. We again discussed driving and recommendation from Neuropsychological evaluation not to drive. We discussed home safety and needing more help at home, which she is resistant to. She will follow-up in 1 year and knows to call our office for any changes.   Thank you for allowing me to participate in her care.  Please do not hesitate to call for any questions or concerns.  The duration of this appointment visit was 25 minutes of face-to-face time with the  patient.  Greater than 50% of this time was spent in counseling, explanation of diagnosis, planning of further management, and coordination of care.   Ellouise Newer, M.D.   CC: Dr. Orland Mustard

## 2015-05-01 ENCOUNTER — Ambulatory Visit: Payer: PPO | Admitting: Neurology

## 2015-05-01 DIAGNOSIS — G912 (Idiopathic) normal pressure hydrocephalus: Secondary | ICD-10-CM | POA: Diagnosis not present

## 2015-05-01 DIAGNOSIS — M47816 Spondylosis without myelopathy or radiculopathy, lumbar region: Secondary | ICD-10-CM | POA: Diagnosis not present

## 2015-05-02 DIAGNOSIS — N3941 Urge incontinence: Secondary | ICD-10-CM | POA: Diagnosis not present

## 2015-05-03 DIAGNOSIS — I739 Peripheral vascular disease, unspecified: Secondary | ICD-10-CM | POA: Diagnosis not present

## 2015-05-03 DIAGNOSIS — E784 Other hyperlipidemia: Secondary | ICD-10-CM | POA: Diagnosis not present

## 2015-05-03 DIAGNOSIS — I480 Paroxysmal atrial fibrillation: Secondary | ICD-10-CM | POA: Diagnosis not present

## 2015-05-03 DIAGNOSIS — Z954 Presence of other heart-valve replacement: Secondary | ICD-10-CM | POA: Diagnosis not present

## 2015-05-03 DIAGNOSIS — Z7901 Long term (current) use of anticoagulants: Secondary | ICD-10-CM | POA: Diagnosis not present

## 2015-05-03 DIAGNOSIS — L03116 Cellulitis of left lower limb: Secondary | ICD-10-CM | POA: Diagnosis not present

## 2015-05-03 DIAGNOSIS — I6529 Occlusion and stenosis of unspecified carotid artery: Secondary | ICD-10-CM | POA: Diagnosis not present

## 2015-05-03 DIAGNOSIS — I119 Hypertensive heart disease without heart failure: Secondary | ICD-10-CM | POA: Diagnosis not present

## 2015-05-07 ENCOUNTER — Encounter: Payer: Self-pay | Admitting: Sports Medicine

## 2015-05-07 ENCOUNTER — Ambulatory Visit (INDEPENDENT_AMBULATORY_CARE_PROVIDER_SITE_OTHER): Payer: PPO | Admitting: Sports Medicine

## 2015-05-07 VITALS — BP 131/72 | HR 83 | Resp 16 | Ht 59.0 in | Wt 120.0 lb

## 2015-05-07 DIAGNOSIS — B351 Tinea unguium: Secondary | ICD-10-CM

## 2015-05-07 DIAGNOSIS — M79675 Pain in left toe(s): Secondary | ICD-10-CM | POA: Diagnosis not present

## 2015-05-07 DIAGNOSIS — M79674 Pain in right toe(s): Secondary | ICD-10-CM

## 2015-05-07 DIAGNOSIS — L600 Ingrowing nail: Secondary | ICD-10-CM | POA: Diagnosis not present

## 2015-05-07 DIAGNOSIS — Z7901 Long term (current) use of anticoagulants: Secondary | ICD-10-CM | POA: Diagnosis not present
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
## 2015-05-09 DIAGNOSIS — N3941 Urge incontinence: Secondary | ICD-10-CM | POA: Diagnosis not present

## 2015-05-16 DIAGNOSIS — N3941 Urge incontinence: Secondary | ICD-10-CM | POA: Diagnosis not present

## 2015-05-20 DIAGNOSIS — R0602 Shortness of breath: Secondary | ICD-10-CM | POA: Diagnosis not present

## 2015-05-20 DIAGNOSIS — Z7901 Long term (current) use of anticoagulants: Secondary | ICD-10-CM | POA: Diagnosis not present

## 2015-05-20 DIAGNOSIS — I119 Hypertensive heart disease without heart failure: Secondary | ICD-10-CM | POA: Diagnosis not present

## 2015-05-20 DIAGNOSIS — I739 Peripheral vascular disease, unspecified: Secondary | ICD-10-CM | POA: Diagnosis not present

## 2015-05-20 DIAGNOSIS — I6529 Occlusion and stenosis of unspecified carotid artery: Secondary | ICD-10-CM | POA: Diagnosis not present

## 2015-05-20 DIAGNOSIS — E784 Other hyperlipidemia: Secondary | ICD-10-CM | POA: Diagnosis not present

## 2015-05-20 DIAGNOSIS — Z954 Presence of other heart-valve replacement: Secondary | ICD-10-CM | POA: Diagnosis not present

## 2015-05-20 DIAGNOSIS — L03116 Cellulitis of left lower limb: Secondary | ICD-10-CM | POA: Diagnosis not present

## 2015-05-20 DIAGNOSIS — R609 Edema, unspecified: Secondary | ICD-10-CM | POA: Diagnosis not present

## 2015-05-20 DIAGNOSIS — I480 Paroxysmal atrial fibrillation: Secondary | ICD-10-CM | POA: Diagnosis not present

## 2015-05-23 DIAGNOSIS — N3941 Urge incontinence: Secondary | ICD-10-CM | POA: Diagnosis not present

## 2015-05-27 DIAGNOSIS — R600 Localized edema: Secondary | ICD-10-CM | POA: Diagnosis not present

## 2015-05-30 DIAGNOSIS — N3941 Urge incontinence: Secondary | ICD-10-CM | POA: Diagnosis not present

## 2015-05-31 ENCOUNTER — Ambulatory Visit (HOSPITAL_COMMUNITY)
Admission: RE | Admit: 2015-05-31 | Discharge: 2015-05-31 | Disposition: A | Discharge status: Home / Self Care | Payer: PPO | Source: Ambulatory Visit | Attending: Vascular Surgery | Admitting: Vascular Surgery

## 2015-05-31 ENCOUNTER — Other Ambulatory Visit: Payer: Self-pay | Admitting: Cardiology

## 2015-05-31 DIAGNOSIS — R609 Edema, unspecified: Secondary | ICD-10-CM | POA: Diagnosis not present

## 2015-05-31 DIAGNOSIS — L03116 Cellulitis of left lower limb: Secondary | ICD-10-CM | POA: Diagnosis not present

## 2015-05-31 DIAGNOSIS — E784 Other hyperlipidemia: Secondary | ICD-10-CM | POA: Diagnosis not present

## 2015-05-31 DIAGNOSIS — I119 Hypertensive heart disease without heart failure: Secondary | ICD-10-CM | POA: Diagnosis not present

## 2015-05-31 DIAGNOSIS — I509 Heart failure, unspecified: Secondary | ICD-10-CM | POA: Insufficient documentation

## 2015-05-31 DIAGNOSIS — I6529 Occlusion and stenosis of unspecified carotid artery: Secondary | ICD-10-CM | POA: Diagnosis not present

## 2015-05-31 DIAGNOSIS — I110 Hypertensive heart disease with heart failure: Secondary | ICD-10-CM | POA: Diagnosis not present

## 2015-05-31 DIAGNOSIS — Z954 Presence of other heart-valve replacement: Secondary | ICD-10-CM | POA: Diagnosis not present

## 2015-05-31 DIAGNOSIS — R0602 Shortness of breath: Secondary | ICD-10-CM | POA: Diagnosis not present

## 2015-05-31 DIAGNOSIS — E7800 Pure hypercholesterolemia, unspecified: Secondary | ICD-10-CM | POA: Diagnosis not present

## 2015-05-31 DIAGNOSIS — I480 Paroxysmal atrial fibrillation: Secondary | ICD-10-CM | POA: Diagnosis not present

## 2015-05-31 DIAGNOSIS — R600 Localized edema: Secondary | ICD-10-CM | POA: Diagnosis not present

## 2015-05-31 DIAGNOSIS — Z7901 Long term (current) use of anticoagulants: Secondary | ICD-10-CM | POA: Diagnosis not present

## 2015-05-31 DIAGNOSIS — I739 Peripheral vascular disease, unspecified: Secondary | ICD-10-CM | POA: Diagnosis not present

## 2015-06-03 DIAGNOSIS — R350 Frequency of micturition: Secondary | ICD-10-CM | POA: Diagnosis not present

## 2015-06-04 ENCOUNTER — Encounter: Payer: Self-pay | Admitting: Neurology

## 2015-06-04 ENCOUNTER — Ambulatory Visit (INDEPENDENT_AMBULATORY_CARE_PROVIDER_SITE_OTHER): Payer: PPO | Admitting: Neurology

## 2015-06-04 VITALS — BP 112/60 | HR 77 | Ht 59.0 in | Wt 121.0 lb

## 2015-06-04 DIAGNOSIS — F09 Unspecified mental disorder due to known physiological condition: Secondary | ICD-10-CM

## 2015-06-04 DIAGNOSIS — G609 Hereditary and idiopathic neuropathy, unspecified: Secondary | ICD-10-CM | POA: Diagnosis not present

## 2015-06-04 DIAGNOSIS — I999 Unspecified disorder of circulatory system: Secondary | ICD-10-CM

## 2015-06-04 DIAGNOSIS — B351 Tinea unguium: Secondary | ICD-10-CM

## 2015-06-04 DIAGNOSIS — F0150 Vascular dementia without behavioral disturbance: Secondary | ICD-10-CM

## 2015-06-04 DIAGNOSIS — F01A0 Vascular dementia, mild, without behavioral disturbance, psychotic disturbance, mood disturbance, and anxiety: Secondary | ICD-10-CM

## 2015-06-04 MED ORDER — GABAPENTIN 100 MG PO CAPS
ORAL_CAPSULE | ORAL | Status: DC
Start: 2015-06-04 — End: 2015-12-06

## 2015-06-04 NOTE — Progress Notes (Signed)
NEUROLOGY FOLLOW UP OFFICE NOTE  Desiree [name] [id number]  HISTORY OF PRESENT ILLNESS: I had the pleasure of seeing Desiree [name] in the neurology clinic on 06/04/2015. Desiree [name] was last seen 2 months ago for vascular dementia and Exelon patch dose was increased. Desiree [name] presents for an earlier visit for evaluation of neuropathy. Desiree [name] is again accompanied by Desiree [name] Desiree [name] who helps supplement the history today. They report that burning, numbness/tingling in Desiree [name] feet have been ongoing for the past year, worse at night. It involves Desiree [name] foot up to Desiree [name] ankles. Hands are unaffected. Desiree [name] has chronic back pain and takes hydrocodone. Desiree [name] denies any falls and ambulates with a cane or walker.   Desiree [name] is very uncomfortable with Desiree [name] various aches and pains, and states Desiree [name] Desiree [name] does not understand Desiree [name]. Desiree [name] continues to have incontinence and needs to use the bathroom multiple times at night. They report being in the middle of a 12-week treatment regimen that was affected by another UTI. Desiree [name] Desiree [name] tells Desiree [name] to let me know about the past weekend, Desiree [name] reports Desiree [name] had a horrible headache and nausea Saturday night, and had a lot of stomach cramps that kept Desiree [name] up all night. At around 2am Desiree [name] heard someone say "bring me some toilet paper," so Desiree [name] went to the door and then asked herself what Desiree [name] was doing knowing there was no one else in the house. Desiree [name] Desiree [name] reports Desiree [name] is a little confused at times, but has been off and on antibiotics for UTIs. After drinking 12 oz of water, Desiree [name] felt a little better.   Laboratory Data: Lab Results  Component Value Date   WBC 3.0* 11/23/2014   HGB 10.7* 11/23/2014   HCT 32.5* 11/23/2014   MCV 96.2 11/23/2014   PLT 123* 11/23/2014     Chemistry      Component Value Date/Time   NA 140 11/22/2014 0312   NA 143 04/09/2014 1347   K 3.9 11/22/2014 0312   K 4.7 04/09/2014 1347   CL 107 11/22/2014 0312   CO2 27 11/22/2014 0312   CO2 27 04/09/2014 1347   BUN 12  11/22/2014 0312   BUN 25.4 04/09/2014 1347   CREATININE 0.68 11/22/2014 0312   CREATININE 0.9 04/09/2014 1347      Component Value Date/Time   CALCIUM 9.1 11/22/2014 0312   CALCIUM 9.5 04/09/2014 1347   ALKPHOS 75 11/21/2014 1600   ALKPHOS 77 04/09/2014 1347   AST 44* 11/21/2014 1600   AST 24 04/09/2014 1347   ALT 33 11/21/2014 1600   ALT 21 04/09/2014 1347   BILITOT 1.1 11/21/2014 1600   BILITOT 0.40 04/09/2014 1347      Component     Latest Ref Rng 04/09/2014  Total Protein, Serum Electrophoresis     6.0 - 8.3 g/dL 6.8  Albumin ELP     55.8 - 66.1 % 55.8  Alpha-1-Globulin     2.9 - 4.9 % 5.1 (H)  Alpha-2-Globulin     7.1 - 11.8 % 11.2  Beta Globulin     4.7 - 7.2 % 6.1  Beta 2     3.2 - 6.5 % 4.9  Gamma Globulin     11.1 - 18.8 % 16.9  M-SPIKE, %      NOT DET  SPE Interp.      *  COMMENT (PROTEIN ELECTROPHOR)      *  Folate      >20.0  Vitamin B12     211 -  911 pg/mL 734  Immunofix Electr Int      *    HPI 10/17/14: This is an 82 yo RH woman with a history of atrial fibrillation, mitral valve repair on chronic anticoagulation with Coumadin, hypertension, hyperlipidemia, right carotid stenosis, chronic back pain, who presented for worsening memory and confusion. The Desiree [name] feels that Desiree [name] has great memory. Desiree [name] does have problems remembering names or would forget what Desiree [name] went to do, but denies getting lost driving, no missed bill payments or missed medications. Desiree [name] lives by herself. Desiree [name] Desiree [name] wrote a 2-page letter detailing Desiree [name] concerns which I will summarize as follows. Desiree [name] states Desiree [name] mother is not aware or is unwilling to admit Desiree [name] has issues with processing information, simple instructions, short term memory, or compromised judgement. Desiree [name] only agreed to come to today's visit after 2 recent events. The first occurred last 09/24/14 after Desiree [name] got cortisone injections to Desiree [name] knees. That evening, Desiree [name] Desiree [name] called Desiree [name] and noted Desiree [name] was very disoriented and confused.  Desiree [name] thought it was a different day and was adamant about it. Desiree [name] was fully dressed and had woken up in Desiree [name] bed, which is unusual for Desiree [name] to take naps in Desiree [name] bed. Desiree [name] did not understand why Desiree [name] had done this. Desiree [name] called Desiree [name] 45 minutes later and seemed better. The next incident occurred on 10/06/14 around 45 minutes after they had left Desiree [name] house, Desiree [name] called and was very upset, confused, reporting bleeding on Desiree [name] face. They came back and found one of Desiree [name] eyeglass lenses on the floor about 5 feet away from Desiree [name] recliner. The frames were bent out of shape. Desiree [name] did not know what had happened. Desiree [name] was brought to the ER where head CT was done which I personally reviewed, showing generalized diffuse atrophy with ventricular dilatation and moderate chronic microvascular disease confluent around the lateral ventricles, stable from 2015. No acute changes seen. Desiree [name] was discharged home back to mental baseline but reporting headaches. Prior to these events, family has noticed difficulty doing tasks that are familiar to Desiree [name]. Desiree [name] does have trouble with new technology, which is not unusual with regards to the iPhone, but Desiree [name] Desiree [name] is concerned that Desiree [name] cannot set the timer on Desiree [name] stove despite repeated lessons and detailed illustration. Desiree [name] constantly loses Desiree [name] keys and pocketbook. Desiree [name] repeats the same stories. A few months ago, the Desiree [name] was very concerned that Desiree [name] bottle of hydrocodone had disappeared. They found the pills in Desiree [name] nightstand but could not find the bottle. Desiree [name] has demonstrated poor judgement in many situations and is very moody and depressed but would not admit to it. Desiree [name] is teary, in constant pain. Desiree [name] is fiercely independent and resilient. Desiree [name] does not sleep well. Desiree [name] denies any alcohol intake or significant head injuries. Desiree [name] younger sister had memory issues. Desiree [name] brother had seizures. Desiree [name] had a normal birth and early development, no history of CNS infections, febrile seizures, or neurosurgical procedures.    Diagnostic Data: Neuropsychological evaluation at [facility]: Overall, Desiree [name] neurocognitive profile was felt to be functionally consistent with a diagnosis of Major Vascular Neurocognitive Disorder. It was felt that NPH was unlikely, as well as Dementia with Lewy Bodies. Given the cerebrovascular changes and ischemia on neuroimaging, vascular disease seems a more probable etiology. Recommendation was consideration for nootropics to help with cognitive efficiency, as well as refraining from driving. Certain memory strategies were given.   PAST MEDICAL HISTORY: Past Medical History  Diagnosis Date  . Diverticulosis     chronic issues with  consitpation and diarrhea  . Carotid artery disease ([place])   . Hypercholesterolemia   . Osteoarthritis     chronic-on multiple pain meds.  . Paroxysmal atrial fibrillation (HCC)     not on coumadin-Cards=[facility]-saw him ?1 yr ago  . CHF (congestive heart failure), NYHA class II (HCC)     has sudden onset decom CHF? 2/2 to PNA  . Lumbar disc disease   . Hypertensive heart disease without CHF   . Lumbar disc disease   . Heart murmur   . Pneumonia     few times  . Hypothyroidism   . Anemia   . Hypertension   . Mitral regurgitation   . Urinary tract infection     hx of UTI  . Irregular heart beat   . Cancer ([place])     skin cancer removed from top of head.per pt    MEDICATIONS: Current Outpatient Prescriptions on File Prior to Visit  Medication Sig Dispense Refill  . atorvastatin (LIPITOR) 40 MG tablet Take 40 mg by mouth daily at 6 PM.    . cholecalciferol (VITAMIN D) 1000 UNITS tablet Take 1,000 Units by mouth daily.    Marland Kitchen HYDROcodone-acetaminophen (NORCO/VICODIN) 5-325 MG tablet Takes 2 tablets every 6 hours  0  . levothyroxine (SYNTHROID, LEVOTHROID) 75 MCG tablet Take 75 mcg by mouth daily.     . metoprolol tartrate (LOPRESSOR) 25 MG tablet Take 25 mg by mouth 2 (two) times daily.     Vladimir Faster Glycol-Propyl Glycol (SYSTANE OP) Apply 2 drops  to eye 4 (four) times daily as needed. For dry eyes.    . polyethylene glycol (MIRALAX / GLYCOLAX) packet Take 17 g by mouth every other day.     . rivastigmine (EXELON) 9.5 mg/24hr Place 1 patch (9.5 mg total) onto the skin daily. 30 patch 12  . solifenacin (VESICARE) 5 MG tablet Take 5 mg by mouth daily.    . TOVIAZ 8 MG TB24 tablet 8 mg. Take 1 tablet daily  1  . warfarin (COUMADIN) 2.5 MG tablet Take 2.5 mg by mouth daily at 6 PM.     No current facility-administered medications on file prior to visit.    ALLERGIES: Allergies  Allergen Reactions  . Celebrex [Celecoxib] Other (See Comments)    Reaction=anemia/leukopenia  . Diovan [Valsartan] Other (See Comments)    Reaction=increase potassium/acute renal failure    FAMILY HISTORY: Family History  Problem Relation Age of Onset  . Cancer Mother   . Stroke Father   . Cancer Father   . Diabetes Sister   . Heart disease Sister     Heart Disease before age 21  . Cancer Brother   . Heart disease Sister     SOCIAL HISTORY: Social History   Social History  . Marital Status: Widowed    Spouse Name: N/A  . Number of Children: 2  . Years of Education: N/A   Occupational History  . Nurse, learning disability [employer]   Social History Main Topics  . Smoking status: Never Smoker   . Smokeless tobacco: Never Used  . Alcohol Use: No  . Drug Use: No  . Sexual Activity: Yes    Birth Control/ Protection: Post-menopausal   Other Topics Concern  . Not on file   Social History Narrative   Lives alone   Works full time   Still drives and very active usually    REVIEW OF SYSTEMS: Constitutional: No fevers, chills, or sweats, no generalized fatigue,  change in appetite Eyes: No visual changes, double vision, eye pain Ear, nose and throat: No hearing loss, ear pain, nasal congestion, sore throat Cardiovascular: No chest pain, palpitations Respiratory:  No shortness of breath at rest or with exertion,  wheezes GastrointestinaI: No nausea, vomiting, diarrhea, abdominal pain, fecal incontinence Genitourinary:  No dysuria, urinary retention or frequency Musculoskeletal:  + neck pain, back pain Integumentary: No rash, pruritus, skin lesions Neurological: as above Psychiatric: No depression, insomnia, anxiety Endocrine: No palpitations, fatigue, diaphoresis, mood swings, change in appetite, change in weight, increased thirst Hematologic/Lymphatic:  No anemia, purpura, petechiae. Allergic/Immunologic: no itchy/runny eyes, nasal congestion, recent allergic reactions, rashes  PHYSICAL EXAM: Filed Vitals:   06/04/15 1003  BP: 112/60  Pulse: 77   General: No acute distress Head:  Normocephalic/atraumatic Neck: supple, no paraspinal tenderness, full range of motion Heart:  Regular rate and rhythm Lungs:  Clear to auscultation bilaterally Back: No paraspinal tenderness Skin/Extremities: No rash, no edema Neurological Exam: alert and oriented to person, place, and time. No aphasia or dysarthria. Fund of knowledge is appropriate.  Recent and remote memory are intact. 2/3 delayed recall. Attention and concentration are normal.  Able to name objects and repeat phrases. Cranial nerves: Pupils equal, round, reactive to light. Extraocular movements intact with no nystagmus. Visual fields full. Facial sensation intact. No facial asymmetry. Tongue, uvula, palate midline.  Motor: Bulk and tone normal, muscle strength 5/5 throughout with no pronator drift.  Sensation decreased to pin in a stocking distribution to ankles bilaterally, decreased vibration to knees bilaterally. No extinction to double simultaneous stimulation.  Deep tendon reflexes brisk +2 throughout, toes downgoing.  Finger to nose testing intact.  Gait slow and cautious, favors left knee (similar to prior). Negative Romberg test.  IMPRESSION: This is an 82 yo RH woman with a history of atrial fibrillation, mitral valve repair on chronic  anticoagulation, hypertension, hyperlipidemia, peripheral vascular disease, chronic back pain, who initially presented for memory loss, Neuropsychological evaluation indicated Major Vascular Neurocognitive Disorder (vascular dementia). Desiree [name] is on Exelon 9.6mg /24hr patch daily. Desiree [name] presents today for a different symptoms of burning and paresthesias in both feet, history and exam consistent with peripheral neuropathy. Bloodwork done a year ago by Desiree [name] podiatrist for neuropathy was unremarkable. We discussed symptomatic treatment, Desiree [name] will start low dose gabapentin 100mg  qhs x 1 week, then uptitrate every week by 100mg  to dose of 300mg  qhs. Side effects were discussed, we may further increase dose if tolerated. Desiree [name] will [name] in 5 months and knows to call our office for any changes.   Thank you for allowing me to participate in Desiree [name] care.  Please do not hesitate to call for any questions or concerns.  The duration of this appointment visit was 25 minutes of face-to-face time with the Desiree [name].  Greater than 50% of this time was spent in counseling, explanation of diagnosis, planning of further management, and coordination of care.   Ellouise Newer, M.D.   CC: Dr. Orland Mustard

## 2015-06-04 NOTE — Patient Instructions (Signed)
1. Start gabapentin 100mg : Take 1 capsule at night for 1 week, then increase to 2 capsules at night for 1 week, then increase to 3 capsules at night and continue 2. Continue all your other medications 3. Follow-up in 5 months, call for any problems

## 2015-06-06 DIAGNOSIS — N3941 Urge incontinence: Secondary | ICD-10-CM | POA: Diagnosis not present

## 2015-06-11 ENCOUNTER — Ambulatory Visit (INDEPENDENT_AMBULATORY_CARE_PROVIDER_SITE_OTHER): Payer: PPO | Admitting: Sports Medicine

## 2015-06-11 ENCOUNTER — Encounter: Payer: Self-pay | Admitting: Sports Medicine

## 2015-06-11 DIAGNOSIS — Z7901 Long term (current) use of anticoagulants: Secondary | ICD-10-CM

## 2015-06-11 DIAGNOSIS — M79675 Pain in left toe(s): Secondary | ICD-10-CM

## 2015-06-11 DIAGNOSIS — M79674 Pain in right toe(s): Secondary | ICD-10-CM

## 2015-06-11 DIAGNOSIS — B351 Tinea unguium: Secondary | ICD-10-CM

## 2015-06-11 DIAGNOSIS — L600 Ingrowing nail: Secondary | ICD-10-CM
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
## 2015-06-13 DIAGNOSIS — N3941 Urge incontinence: Secondary | ICD-10-CM | POA: Diagnosis not present

## 2015-06-20 DIAGNOSIS — N3941 Urge incontinence: Secondary | ICD-10-CM | POA: Diagnosis not present

## 2015-06-26 DIAGNOSIS — M25511 Pain in right shoulder: Secondary | ICD-10-CM | POA: Diagnosis not present

## 2015-06-26 DIAGNOSIS — M170 Bilateral primary osteoarthritis of knee: Secondary | ICD-10-CM | POA: Diagnosis not present

## 2015-06-27 DIAGNOSIS — I739 Peripheral vascular disease, unspecified: Secondary | ICD-10-CM | POA: Diagnosis not present

## 2015-06-27 DIAGNOSIS — E784 Other hyperlipidemia: Secondary | ICD-10-CM | POA: Diagnosis not present

## 2015-06-27 DIAGNOSIS — R0602 Shortness of breath: Secondary | ICD-10-CM | POA: Diagnosis not present

## 2015-06-27 DIAGNOSIS — Z7901 Long term (current) use of anticoagulants: Secondary | ICD-10-CM | POA: Diagnosis not present

## 2015-06-27 DIAGNOSIS — I480 Paroxysmal atrial fibrillation: Secondary | ICD-10-CM | POA: Diagnosis not present

## 2015-06-27 DIAGNOSIS — I6529 Occlusion and stenosis of unspecified carotid artery: Secondary | ICD-10-CM | POA: Diagnosis not present

## 2015-06-27 DIAGNOSIS — I119 Hypertensive heart disease without heart failure: Secondary | ICD-10-CM | POA: Diagnosis not present

## 2015-06-27 DIAGNOSIS — R609 Edema, unspecified: Secondary | ICD-10-CM | POA: Diagnosis not present

## 2015-06-27 DIAGNOSIS — Z954 Presence of other heart-valve replacement: Secondary | ICD-10-CM | POA: Diagnosis not present

## 2015-06-27 DIAGNOSIS — L03116 Cellulitis of left lower limb: Secondary | ICD-10-CM | POA: Diagnosis not present

## 2015-06-27 DIAGNOSIS — R600 Localized edema: Secondary | ICD-10-CM | POA: Diagnosis not present

## 2015-06-27 DIAGNOSIS — N3941 Urge incontinence: Secondary | ICD-10-CM | POA: Diagnosis not present

## 2015-07-03 DIAGNOSIS — M170 Bilateral primary osteoarthritis of knee: Secondary | ICD-10-CM | POA: Diagnosis not present

## 2015-07-04 DIAGNOSIS — N3941 Urge incontinence: Secondary | ICD-10-CM | POA: Diagnosis not present

## 2015-07-09 DIAGNOSIS — N3941 Urge incontinence: Secondary | ICD-10-CM | POA: Diagnosis not present

## 2015-07-09 DIAGNOSIS — M170 Bilateral primary osteoarthritis of knee: Secondary | ICD-10-CM | POA: Diagnosis not present

## 2015-07-16 ENCOUNTER — Ambulatory Visit: Payer: PPO | Admitting: Sports Medicine

## 2015-07-18 DIAGNOSIS — N3941 Urge incontinence: Secondary | ICD-10-CM | POA: Diagnosis not present

## 2015-07-24 DIAGNOSIS — I6529 Occlusion and stenosis of unspecified carotid artery: Secondary | ICD-10-CM | POA: Diagnosis not present

## 2015-07-24 DIAGNOSIS — Z7901 Long term (current) use of anticoagulants: Secondary | ICD-10-CM | POA: Diagnosis not present

## 2015-07-24 DIAGNOSIS — R600 Localized edema: Secondary | ICD-10-CM | POA: Diagnosis not present

## 2015-07-24 DIAGNOSIS — I739 Peripheral vascular disease, unspecified: Secondary | ICD-10-CM | POA: Diagnosis not present

## 2015-07-24 DIAGNOSIS — I480 Paroxysmal atrial fibrillation: Secondary | ICD-10-CM | POA: Diagnosis not present

## 2015-07-24 DIAGNOSIS — R0602 Shortness of breath: Secondary | ICD-10-CM | POA: Diagnosis not present

## 2015-07-24 DIAGNOSIS — L03116 Cellulitis of left lower limb: Secondary | ICD-10-CM | POA: Diagnosis not present

## 2015-07-24 DIAGNOSIS — E784 Other hyperlipidemia: Secondary | ICD-10-CM | POA: Diagnosis not present

## 2015-07-24 DIAGNOSIS — R609 Edema, unspecified: Secondary | ICD-10-CM | POA: Diagnosis not present

## 2015-07-24 DIAGNOSIS — Z954 Presence of other heart-valve replacement: Secondary | ICD-10-CM | POA: Diagnosis not present

## 2015-07-24 DIAGNOSIS — I119 Hypertensive heart disease without heart failure: Secondary | ICD-10-CM | POA: Diagnosis not present

## 2015-07-29 ENCOUNTER — Ambulatory Visit (INDEPENDENT_AMBULATORY_CARE_PROVIDER_SITE_OTHER): Payer: PPO | Admitting: Sports Medicine

## 2015-07-29 ENCOUNTER — Encounter: Payer: Self-pay | Admitting: Sports Medicine

## 2015-07-29 DIAGNOSIS — B351 Tinea unguium: Secondary | ICD-10-CM

## 2015-07-29 DIAGNOSIS — M79674 Pain in right toe(s): Secondary | ICD-10-CM | POA: Diagnosis not present

## 2015-07-29 DIAGNOSIS — M79675 Pain in left toe(s): Secondary | ICD-10-CM

## 2015-07-29 DIAGNOSIS — Z7901 Long term (current) use of anticoagulants: Secondary | ICD-10-CM

## 2015-07-29 DIAGNOSIS — L600 Ingrowing nail: Secondary | ICD-10-CM
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
## 2015-08-07 DIAGNOSIS — I480 Paroxysmal atrial fibrillation: Secondary | ICD-10-CM | POA: Diagnosis not present

## 2015-08-07 DIAGNOSIS — R0602 Shortness of breath: Secondary | ICD-10-CM | POA: Diagnosis not present

## 2015-08-07 DIAGNOSIS — L03116 Cellulitis of left lower limb: Secondary | ICD-10-CM | POA: Diagnosis not present

## 2015-08-07 DIAGNOSIS — Z7901 Long term (current) use of anticoagulants: Secondary | ICD-10-CM | POA: Diagnosis not present

## 2015-08-07 DIAGNOSIS — R609 Edema, unspecified: Secondary | ICD-10-CM | POA: Diagnosis not present

## 2015-08-07 DIAGNOSIS — I6529 Occlusion and stenosis of unspecified carotid artery: Secondary | ICD-10-CM | POA: Diagnosis not present

## 2015-08-07 DIAGNOSIS — E784 Other hyperlipidemia: Secondary | ICD-10-CM | POA: Diagnosis not present

## 2015-08-07 DIAGNOSIS — R600 Localized edema: Secondary | ICD-10-CM | POA: Diagnosis not present

## 2015-08-07 DIAGNOSIS — I119 Hypertensive heart disease without heart failure: Secondary | ICD-10-CM | POA: Diagnosis not present

## 2015-08-07 DIAGNOSIS — I739 Peripheral vascular disease, unspecified: Secondary | ICD-10-CM | POA: Diagnosis not present

## 2015-08-07 DIAGNOSIS — Z954 Presence of other heart-valve replacement: Secondary | ICD-10-CM | POA: Diagnosis not present

## 2015-08-08 DIAGNOSIS — N3941 Urge incontinence: Secondary | ICD-10-CM | POA: Diagnosis not present

## 2015-08-13 DIAGNOSIS — R600 Localized edema: Secondary | ICD-10-CM | POA: Diagnosis not present

## 2015-08-13 DIAGNOSIS — R0602 Shortness of breath: Secondary | ICD-10-CM | POA: Diagnosis not present

## 2015-08-13 DIAGNOSIS — I739 Peripheral vascular disease, unspecified: Secondary | ICD-10-CM | POA: Diagnosis not present

## 2015-08-13 DIAGNOSIS — I119 Hypertensive heart disease without heart failure: Secondary | ICD-10-CM | POA: Diagnosis not present

## 2015-08-13 DIAGNOSIS — R609 Edema, unspecified: Secondary | ICD-10-CM | POA: Diagnosis not present

## 2015-08-13 DIAGNOSIS — Z954 Presence of other heart-valve replacement: Secondary | ICD-10-CM | POA: Diagnosis not present

## 2015-08-13 DIAGNOSIS — I480 Paroxysmal atrial fibrillation: Secondary | ICD-10-CM | POA: Diagnosis not present

## 2015-08-13 DIAGNOSIS — E784 Other hyperlipidemia: Secondary | ICD-10-CM | POA: Diagnosis not present

## 2015-08-13 DIAGNOSIS — Z7901 Long term (current) use of anticoagulants: Secondary | ICD-10-CM | POA: Diagnosis not present

## 2015-08-13 DIAGNOSIS — I6529 Occlusion and stenosis of unspecified carotid artery: Secondary | ICD-10-CM | POA: Diagnosis not present

## 2015-08-13 DIAGNOSIS — L03116 Cellulitis of left lower limb: Secondary | ICD-10-CM | POA: Diagnosis not present

## 2015-08-15 DIAGNOSIS — D1801 Hemangioma of skin and subcutaneous tissue: Secondary | ICD-10-CM | POA: Diagnosis not present

## 2015-08-15 DIAGNOSIS — Z85828 Personal history of other malignant neoplasm of skin: Secondary | ICD-10-CM | POA: Diagnosis not present

## 2015-08-15 DIAGNOSIS — L821 Other seborrheic keratosis: Secondary | ICD-10-CM | POA: Diagnosis not present

## 2015-08-16 DIAGNOSIS — M1711 Unilateral primary osteoarthritis, right knee: Secondary | ICD-10-CM | POA: Diagnosis not present

## 2015-08-16 DIAGNOSIS — M25511 Pain in right shoulder: Secondary | ICD-10-CM | POA: Diagnosis not present

## 2015-08-20 DIAGNOSIS — G912 (Idiopathic) normal pressure hydrocephalus: Secondary | ICD-10-CM | POA: Diagnosis not present

## 2015-08-20 DIAGNOSIS — M47816 Spondylosis without myelopathy or radiculopathy, lumbar region: Secondary | ICD-10-CM | POA: Diagnosis not present

## 2015-08-28 DIAGNOSIS — I480 Paroxysmal atrial fibrillation: Secondary | ICD-10-CM | POA: Diagnosis not present

## 2015-08-28 DIAGNOSIS — R609 Edema, unspecified: Secondary | ICD-10-CM | POA: Diagnosis not present

## 2015-08-28 DIAGNOSIS — R0602 Shortness of breath: Secondary | ICD-10-CM | POA: Diagnosis not present

## 2015-08-28 DIAGNOSIS — I119 Hypertensive heart disease without heart failure: Secondary | ICD-10-CM | POA: Diagnosis not present

## 2015-08-28 DIAGNOSIS — L03116 Cellulitis of left lower limb: Secondary | ICD-10-CM | POA: Diagnosis not present

## 2015-08-28 DIAGNOSIS — E784 Other hyperlipidemia: Secondary | ICD-10-CM | POA: Diagnosis not present

## 2015-08-28 DIAGNOSIS — Z954 Presence of other heart-valve replacement: Secondary | ICD-10-CM | POA: Diagnosis not present

## 2015-08-28 DIAGNOSIS — I739 Peripheral vascular disease, unspecified: Secondary | ICD-10-CM | POA: Diagnosis not present

## 2015-08-28 DIAGNOSIS — Z7901 Long term (current) use of anticoagulants: Secondary | ICD-10-CM | POA: Diagnosis not present

## 2015-08-28 DIAGNOSIS — I6529 Occlusion and stenosis of unspecified carotid artery: Secondary | ICD-10-CM | POA: Diagnosis not present

## 2015-08-28 DIAGNOSIS — R600 Localized edema: Secondary | ICD-10-CM | POA: Diagnosis not present

## 2015-08-29 DIAGNOSIS — N3941 Urge incontinence: Secondary | ICD-10-CM | POA: Diagnosis not present

## 2015-09-02 DIAGNOSIS — H26492 Other secondary cataract, left eye: Secondary | ICD-10-CM | POA: Diagnosis not present

## 2015-09-02 DIAGNOSIS — L03116 Cellulitis of left lower limb: Secondary | ICD-10-CM | POA: Diagnosis not present

## 2015-09-02 DIAGNOSIS — R600 Localized edema: Secondary | ICD-10-CM | POA: Diagnosis not present

## 2015-09-02 DIAGNOSIS — E784 Other hyperlipidemia: Secondary | ICD-10-CM | POA: Diagnosis not present

## 2015-09-02 DIAGNOSIS — I6529 Occlusion and stenosis of unspecified carotid artery: Secondary | ICD-10-CM | POA: Diagnosis not present

## 2015-09-02 DIAGNOSIS — I511 Rupture of chordae tendineae, not elsewhere classified: Secondary | ICD-10-CM | POA: Diagnosis not present

## 2015-09-02 DIAGNOSIS — Z954 Presence of other heart-valve replacement: Secondary | ICD-10-CM | POA: Diagnosis not present

## 2015-09-02 DIAGNOSIS — H35373 Puckering of macula, bilateral: Secondary | ICD-10-CM | POA: Diagnosis not present

## 2015-09-02 DIAGNOSIS — I739 Peripheral vascular disease, unspecified: Secondary | ICD-10-CM | POA: Diagnosis not present

## 2015-09-02 DIAGNOSIS — H04123 Dry eye syndrome of bilateral lacrimal glands: Secondary | ICD-10-CM | POA: Diagnosis not present

## 2015-09-02 DIAGNOSIS — I119 Hypertensive heart disease without heart failure: Secondary | ICD-10-CM | POA: Diagnosis not present

## 2015-09-02 DIAGNOSIS — I480 Paroxysmal atrial fibrillation: Secondary | ICD-10-CM | POA: Diagnosis not present

## 2015-09-02 DIAGNOSIS — Z9841 Cataract extraction status, right eye: Secondary | ICD-10-CM | POA: Diagnosis not present

## 2015-09-02 DIAGNOSIS — Z7901 Long term (current) use of anticoagulants: Secondary | ICD-10-CM | POA: Diagnosis not present

## 2015-09-11 DIAGNOSIS — R0602 Shortness of breath: Secondary | ICD-10-CM | POA: Diagnosis not present

## 2015-09-11 DIAGNOSIS — R609 Edema, unspecified: Secondary | ICD-10-CM | POA: Diagnosis not present

## 2015-09-11 DIAGNOSIS — Z7901 Long term (current) use of anticoagulants: Secondary | ICD-10-CM | POA: Diagnosis not present

## 2015-09-11 DIAGNOSIS — E784 Other hyperlipidemia: Secondary | ICD-10-CM | POA: Diagnosis not present

## 2015-09-11 DIAGNOSIS — L03116 Cellulitis of left lower limb: Secondary | ICD-10-CM | POA: Diagnosis not present

## 2015-09-11 DIAGNOSIS — I6529 Occlusion and stenosis of unspecified carotid artery: Secondary | ICD-10-CM | POA: Diagnosis not present

## 2015-09-11 DIAGNOSIS — Z954 Presence of other heart-valve replacement: Secondary | ICD-10-CM | POA: Diagnosis not present

## 2015-09-11 DIAGNOSIS — I739 Peripheral vascular disease, unspecified: Secondary | ICD-10-CM | POA: Diagnosis not present

## 2015-09-11 DIAGNOSIS — I480 Paroxysmal atrial fibrillation: Secondary | ICD-10-CM | POA: Diagnosis not present

## 2015-09-11 DIAGNOSIS — R600 Localized edema: Secondary | ICD-10-CM | POA: Diagnosis not present

## 2015-09-11 DIAGNOSIS — I119 Hypertensive heart disease without heart failure: Secondary | ICD-10-CM | POA: Diagnosis not present

## 2015-09-16 ENCOUNTER — Ambulatory Visit (INDEPENDENT_AMBULATORY_CARE_PROVIDER_SITE_OTHER): Payer: PPO | Admitting: Sports Medicine

## 2015-09-16 ENCOUNTER — Encounter: Payer: Self-pay | Admitting: Sports Medicine

## 2015-09-16 DIAGNOSIS — M79675 Pain in left toe(s): Secondary | ICD-10-CM

## 2015-09-16 DIAGNOSIS — M79674 Pain in right toe(s): Secondary | ICD-10-CM

## 2015-09-16 DIAGNOSIS — B351 Tinea unguium: Secondary | ICD-10-CM

## 2015-09-16 DIAGNOSIS — L600 Ingrowing nail: Secondary | ICD-10-CM

## 2015-09-16 DIAGNOSIS — Z7901 Long term (current) use of anticoagulants: Secondary | ICD-10-CM
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
## 2015-09-17 DIAGNOSIS — H35373 Puckering of macula, bilateral: Secondary | ICD-10-CM | POA: Diagnosis not present

## 2015-09-17 DIAGNOSIS — H43823 Vitreomacular adhesion, bilateral: Secondary | ICD-10-CM | POA: Diagnosis not present

## 2015-09-17 DIAGNOSIS — H43813 Vitreous degeneration, bilateral: Secondary | ICD-10-CM | POA: Diagnosis not present

## 2015-09-18 DIAGNOSIS — H02401 Unspecified ptosis of right eyelid: Secondary | ICD-10-CM | POA: Diagnosis not present

## 2015-09-18 DIAGNOSIS — H43813 Vitreous degeneration, bilateral: Secondary | ICD-10-CM | POA: Diagnosis not present

## 2015-09-18 DIAGNOSIS — H26492 Other secondary cataract, left eye: Secondary | ICD-10-CM | POA: Diagnosis not present

## 2015-09-19 DIAGNOSIS — N3941 Urge incontinence: Secondary | ICD-10-CM | POA: Diagnosis not present

## 2015-09-25 DIAGNOSIS — I739 Peripheral vascular disease, unspecified: Secondary | ICD-10-CM | POA: Diagnosis not present

## 2015-09-25 DIAGNOSIS — R600 Localized edema: Secondary | ICD-10-CM | POA: Diagnosis not present

## 2015-09-25 DIAGNOSIS — Z954 Presence of other heart-valve replacement: Secondary | ICD-10-CM | POA: Diagnosis not present

## 2015-09-25 DIAGNOSIS — R1031 Right lower quadrant pain: Secondary | ICD-10-CM | POA: Diagnosis not present

## 2015-09-25 DIAGNOSIS — I480 Paroxysmal atrial fibrillation: Secondary | ICD-10-CM | POA: Diagnosis not present

## 2015-09-25 DIAGNOSIS — I119 Hypertensive heart disease without heart failure: Secondary | ICD-10-CM | POA: Diagnosis not present

## 2015-09-25 DIAGNOSIS — E785 Hyperlipidemia, unspecified: Secondary | ICD-10-CM | POA: Diagnosis not present

## 2015-09-25 DIAGNOSIS — M130 Polyarthritis, unspecified: Secondary | ICD-10-CM | POA: Diagnosis not present

## 2015-09-25 DIAGNOSIS — Z7901 Long term (current) use of anticoagulants: Secondary | ICD-10-CM | POA: Diagnosis not present

## 2015-09-25 DIAGNOSIS — E784 Other hyperlipidemia: Secondary | ICD-10-CM | POA: Diagnosis not present

## 2015-09-25 DIAGNOSIS — Z0000 Encounter for general adult medical examination without abnormal findings: Secondary | ICD-10-CM | POA: Diagnosis not present

## 2015-09-25 DIAGNOSIS — D649 Anemia, unspecified: Secondary | ICD-10-CM | POA: Diagnosis not present

## 2015-09-25 DIAGNOSIS — R609 Edema, unspecified: Secondary | ICD-10-CM | POA: Diagnosis not present

## 2015-09-25 DIAGNOSIS — I10 Essential (primary) hypertension: Secondary | ICD-10-CM | POA: Diagnosis not present

## 2015-09-25 DIAGNOSIS — I6529 Occlusion and stenosis of unspecified carotid artery: Secondary | ICD-10-CM | POA: Diagnosis not present

## 2015-09-25 DIAGNOSIS — E039 Hypothyroidism, unspecified: Secondary | ICD-10-CM | POA: Diagnosis not present

## 2015-09-25 DIAGNOSIS — L03116 Cellulitis of left lower limb: Secondary | ICD-10-CM | POA: Diagnosis not present

## 2015-09-25 DIAGNOSIS — R0602 Shortness of breath: Secondary | ICD-10-CM | POA: Diagnosis not present

## 2015-09-25 DIAGNOSIS — E559 Vitamin D deficiency, unspecified: Secondary | ICD-10-CM | POA: Diagnosis not present

## 2015-09-26 ENCOUNTER — Other Ambulatory Visit: Payer: Self-pay | Admitting: Family Medicine

## 2015-09-26 DIAGNOSIS — R1031 Right lower quadrant pain: Secondary | ICD-10-CM

## 2015-09-27 ENCOUNTER — Ambulatory Visit
Admission: RE | Admit: 2015-09-27 | Discharge: 2015-09-27 | Disposition: A | Discharge status: Home / Self Care | Payer: PPO | Source: Ambulatory Visit | Attending: Family Medicine | Admitting: Family Medicine

## 2015-09-27 DIAGNOSIS — R1031 Right lower quadrant pain: Secondary | ICD-10-CM | POA: Diagnosis not present

## 2015-09-27 DIAGNOSIS — Z961 Presence of intraocular lens: Secondary | ICD-10-CM | POA: Diagnosis not present

## 2015-09-30 ENCOUNTER — Other Ambulatory Visit: Payer: PPO

## 2015-10-02 DIAGNOSIS — Z954 Presence of other heart-valve replacement: Secondary | ICD-10-CM | POA: Diagnosis not present

## 2015-10-02 DIAGNOSIS — I119 Hypertensive heart disease without heart failure: Secondary | ICD-10-CM | POA: Diagnosis not present

## 2015-10-02 DIAGNOSIS — E784 Other hyperlipidemia: Secondary | ICD-10-CM | POA: Diagnosis not present

## 2015-10-02 DIAGNOSIS — I6529 Occlusion and stenosis of unspecified carotid artery: Secondary | ICD-10-CM | POA: Diagnosis not present

## 2015-10-02 DIAGNOSIS — I739 Peripheral vascular disease, unspecified: Secondary | ICD-10-CM | POA: Diagnosis not present

## 2015-10-02 DIAGNOSIS — Z7901 Long term (current) use of anticoagulants: Secondary | ICD-10-CM | POA: Diagnosis not present

## 2015-10-02 DIAGNOSIS — I480 Paroxysmal atrial fibrillation: Secondary | ICD-10-CM | POA: Diagnosis not present

## 2015-10-02 DIAGNOSIS — L03116 Cellulitis of left lower limb: Secondary | ICD-10-CM | POA: Diagnosis not present

## 2015-10-02 DIAGNOSIS — R609 Edema, unspecified: Secondary | ICD-10-CM | POA: Diagnosis not present

## 2015-10-02 DIAGNOSIS — R600 Localized edema: Secondary | ICD-10-CM | POA: Diagnosis not present

## 2015-10-02 DIAGNOSIS — R0602 Shortness of breath: Secondary | ICD-10-CM | POA: Diagnosis not present

## 2015-10-07 ENCOUNTER — Ambulatory Visit
Admission: RE | Admit: 2015-10-07 | Discharge: 2015-10-07 | Disposition: A | Discharge status: Home / Self Care | Payer: PPO | Source: Ambulatory Visit | Attending: Family Medicine | Admitting: Family Medicine

## 2015-10-07 DIAGNOSIS — R1031 Right lower quadrant pain: Secondary | ICD-10-CM | POA: Diagnosis not present

## 2015-10-10 DIAGNOSIS — M1611 Unilateral primary osteoarthritis, right hip: Secondary | ICD-10-CM | POA: Diagnosis not present

## 2015-10-10 DIAGNOSIS — N3941 Urge incontinence: Secondary | ICD-10-CM | POA: Diagnosis not present

## 2015-10-16 DIAGNOSIS — I739 Peripheral vascular disease, unspecified: Secondary | ICD-10-CM | POA: Diagnosis not present

## 2015-10-16 DIAGNOSIS — L03116 Cellulitis of left lower limb: Secondary | ICD-10-CM | POA: Diagnosis not present

## 2015-10-16 DIAGNOSIS — I480 Paroxysmal atrial fibrillation: Secondary | ICD-10-CM | POA: Diagnosis not present

## 2015-10-16 DIAGNOSIS — E784 Other hyperlipidemia: Secondary | ICD-10-CM | POA: Diagnosis not present

## 2015-10-16 DIAGNOSIS — Z954 Presence of other heart-valve replacement: Secondary | ICD-10-CM | POA: Diagnosis not present

## 2015-10-16 DIAGNOSIS — R600 Localized edema: Secondary | ICD-10-CM | POA: Diagnosis not present

## 2015-10-16 DIAGNOSIS — Z7901 Long term (current) use of anticoagulants: Secondary | ICD-10-CM | POA: Diagnosis not present

## 2015-10-16 DIAGNOSIS — I6529 Occlusion and stenosis of unspecified carotid artery: Secondary | ICD-10-CM | POA: Diagnosis not present

## 2015-10-16 DIAGNOSIS — I119 Hypertensive heart disease without heart failure: Secondary | ICD-10-CM | POA: Diagnosis not present

## 2015-10-16 DIAGNOSIS — R609 Edema, unspecified: Secondary | ICD-10-CM | POA: Diagnosis not present

## 2015-10-16 DIAGNOSIS — R0602 Shortness of breath: Secondary | ICD-10-CM | POA: Diagnosis not present

## 2015-10-25 DIAGNOSIS — M25511 Pain in right shoulder: Secondary | ICD-10-CM | POA: Diagnosis not present

## 2015-10-25 DIAGNOSIS — M25512 Pain in left shoulder: Secondary | ICD-10-CM | POA: Diagnosis not present

## 2015-10-25 DIAGNOSIS — M545 Low back pain: Secondary | ICD-10-CM | POA: Diagnosis not present

## 2015-10-29 ENCOUNTER — Ambulatory Visit (INDEPENDENT_AMBULATORY_CARE_PROVIDER_SITE_OTHER): Payer: PPO | Admitting: Sports Medicine

## 2015-10-29 ENCOUNTER — Encounter: Payer: Self-pay | Admitting: Sports Medicine

## 2015-10-29 DIAGNOSIS — B351 Tinea unguium: Secondary | ICD-10-CM | POA: Diagnosis not present

## 2015-10-29 DIAGNOSIS — M79674 Pain in right toe(s): Secondary | ICD-10-CM

## 2015-10-29 DIAGNOSIS — L600 Ingrowing nail: Secondary | ICD-10-CM

## 2015-10-29 DIAGNOSIS — M79675 Pain in left toe(s): Secondary | ICD-10-CM

## 2015-10-29 DIAGNOSIS — Z7901 Long term (current) use of anticoagulants: Secondary | ICD-10-CM
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
## 2015-10-31 DIAGNOSIS — N3941 Urge incontinence: Secondary | ICD-10-CM | POA: Diagnosis not present

## 2015-11-12 DIAGNOSIS — I119 Hypertensive heart disease without heart failure: Secondary | ICD-10-CM | POA: Diagnosis not present

## 2015-11-12 DIAGNOSIS — L03116 Cellulitis of left lower limb: Secondary | ICD-10-CM | POA: Diagnosis not present

## 2015-11-12 DIAGNOSIS — I739 Peripheral vascular disease, unspecified: Secondary | ICD-10-CM | POA: Diagnosis not present

## 2015-11-12 DIAGNOSIS — I480 Paroxysmal atrial fibrillation: Secondary | ICD-10-CM | POA: Diagnosis not present

## 2015-11-12 DIAGNOSIS — E784 Other hyperlipidemia: Secondary | ICD-10-CM | POA: Diagnosis not present

## 2015-11-12 DIAGNOSIS — Z7901 Long term (current) use of anticoagulants: Secondary | ICD-10-CM | POA: Diagnosis not present

## 2015-11-12 DIAGNOSIS — R0602 Shortness of breath: Secondary | ICD-10-CM | POA: Diagnosis not present

## 2015-11-12 DIAGNOSIS — R609 Edema, unspecified: Secondary | ICD-10-CM | POA: Diagnosis not present

## 2015-11-12 DIAGNOSIS — Z954 Presence of other heart-valve replacement: Secondary | ICD-10-CM | POA: Diagnosis not present

## 2015-11-12 DIAGNOSIS — I511 Rupture of chordae tendineae, not elsewhere classified: Secondary | ICD-10-CM | POA: Diagnosis not present

## 2015-11-12 DIAGNOSIS — R600 Localized edema: Secondary | ICD-10-CM | POA: Diagnosis not present

## 2015-11-12 DIAGNOSIS — I6529 Occlusion and stenosis of unspecified carotid artery: Secondary | ICD-10-CM | POA: Diagnosis not present

## 2015-11-19 ENCOUNTER — Ambulatory Visit: Payer: PPO | Admitting: Neurology

## 2015-11-21 DIAGNOSIS — N3941 Urge incontinence: Secondary | ICD-10-CM | POA: Diagnosis not present

## 2015-12-03 DIAGNOSIS — Z23 Encounter for immunization: Secondary | ICD-10-CM | POA: Diagnosis not present

## 2015-12-06 ENCOUNTER — Encounter: Payer: Self-pay | Admitting: Neurology

## 2015-12-06 ENCOUNTER — Ambulatory Visit (INDEPENDENT_AMBULATORY_CARE_PROVIDER_SITE_OTHER): Payer: PPO | Admitting: Neurology

## 2015-12-06 VITALS — BP 128/62 | HR 69 | Temp 97.6°F | Ht 59.0 in | Wt 118.2 lb

## 2015-12-06 DIAGNOSIS — G609 Hereditary and idiopathic neuropathy, unspecified: Secondary | ICD-10-CM | POA: Diagnosis not present

## 2015-12-06 DIAGNOSIS — F0150 Vascular dementia without behavioral disturbance: Secondary | ICD-10-CM

## 2015-12-06 DIAGNOSIS — F01A0 Vascular dementia, mild, without behavioral disturbance, psychotic disturbance, mood disturbance, and anxiety: Secondary | ICD-10-CM

## 2015-12-06 MED ORDER — RIVASTIGMINE 9.5 MG/24HR TD PT24: 9.5000 mg | MEDICATED_PATCH | Freq: Every day | TRANSDERMAL | 12 refills | Status: DC

## 2015-12-06 MED ORDER — GABAPENTIN 100 MG PO CAPS: ORAL_CAPSULE | ORAL | 6 refills | Status: DC

## 2015-12-06 NOTE — Progress Notes (Signed)
NEUROLOGY FOLLOW UP OFFICE NOTE  Desiree [name] [id number]  HISTORY OF PRESENT ILLNESS: I had the pleasure of seeing Desiree [name] in follow-up in the neurology clinic on 12/06/2015. She was last seen 6 months ago for vascular dementia and continues on Exelon patch without side effects. She feels her memory has not changed a lot, but notices she forgets things. Her daughter feels it has progressed a tiny bit, she calls people she has known well "whatchamacallit," she does not recall that food was in the toaster. She does not drive. She is in charge of her medications and can tell me what she is taking pretty accurately. She was started on gabapentin on her last visit for painful neuropathy. Her daughter states that she has not complained too much about the pain since starting the medication, but it still bothers her at night. It has helped her sleep more, however she has other issues (chronic musculoskeletal pain and urinary frequency) that keeps her up at night. The urinary issues have improved somewhat, she does not have any more accidents during the day, but still gets up 3 times at night. She has been receiving electrical stimulation treatments every 3 weeks which seems to help. Family sees her everyday, she is able to fix her meals independently. She is able to dress and bathe but per daughter could get more help with dressing. She denies any falls.   HPI 10/17/14: This is an 82 yo RH woman with a history of atrial fibrillation, mitral valve repair on chronic anticoagulation with Coumadin, hypertension, hyperlipidemia, right carotid stenosis, chronic back pain, who presented for worsening memory and confusion. The patient feels that she has great memory. She does have problems remembering names or would forget what she went to do, but denies getting lost driving, no missed bill payments or missed medications. She lives by herself. Her daughter wrote a 2-page letter detailing her concerns which I will  summarize as follows. She states her mother is not aware or is unwilling to admit she has issues with processing information, simple instructions, short term memory, or compromised judgement. She only agreed to come to today's visit after 2 recent events. The first occurred last 09/24/14 after she got cortisone injections to her knees. That evening, her daughter called her and noted she was very disoriented and confused. She thought it was a different day and was adamant about it. She was fully dressed and had woken up in her bed, which is unusual for her to take naps in her bed. She did not understand why she had done this. She called her 45 minutes later and seemed better. The next incident occurred on 10/06/14 around 45 minutes after they had left her house, she called and was very upset, confused, reporting bleeding on her face. They came back and found one of her eyeglass lenses on the floor about 5 feet away from her recliner. The frames were bent out of shape. She did not know what had happened. She was brought to the ER where head CT was done which I personally reviewed, showing generalized diffuse atrophy with ventricular dilatation and moderate chronic microvascular disease confluent around the lateral ventricles, stable from 2015. No acute changes seen. She was discharged home back to mental baseline but reporting headaches. Prior to these events, family has noticed difficulty doing tasks that are familiar to her. She does have trouble with new technology, which is not unusual with regards to the iPhone, but her daughter is concerned that  she cannot set the timer on her stove despite repeated lessons and detailed illustration. She constantly loses her keys and pocketbook. She repeats the same stories. A few months ago, the patient was very concerned that her bottle of hydrocodone had disappeared. They found the pills in her nightstand but could not find the bottle. She has demonstrated poor judgement in many  situations and is very moody and depressed but would not admit to it. She is teary, in constant pain. She is fiercely independent and resilient. She does not sleep well. She denies any alcohol intake or significant head injuries. Her younger sister had memory issues. Her brother had seizures. She had a normal birth and early development, no history of CNS infections, febrile seizures, or neurosurgical procedures.   Diagnostic Data: Neuropsychological evaluation at [facility]: Overall, her neurocognitive profile was felt to be functionally consistent with a diagnosis of Major Vascular Neurocognitive Disorder. It was felt that NPH was unlikely, as well as Dementia with Lewy Bodies. Given the cerebrovascular changes and ischemia on neuroimaging, vascular disease seems a more probable etiology. Recommendation was consideration for nootropics to help with cognitive efficiency, as well as refraining from driving. Certain memory strategies were given.   PAST MEDICAL HISTORY: Past Medical History:  Diagnosis Date  . Anemia   . Cancer ([place])    skin cancer removed from top of head.per pt  . Carotid artery disease ([place])   . CHF (congestive heart failure), NYHA class II ([facility])    has sudden onset decom CHF? 2/2 to PNA  . Diverticulosis    chronic issues with consitpation and diarrhea  . Heart murmur   . Hypercholesterolemia   . Hypertension   . Hypertensive heart disease without CHF   . Hypothyroidism   . Irregular heart beat   . Lumbar disc disease   . Lumbar disc disease   . Mitral regurgitation   . Osteoarthritis    chronic-on multiple pain meds.  . Paroxysmal atrial fibrillation ([facility])    not on coumadin-Cards=[facility]-saw him ?1 yr ago  . Pneumonia    few times  . Urinary tract infection    hx of UTI    MEDICATIONS: Current Outpatient Prescriptions on File Prior to Visit  Medication Sig Dispense Refill  . atorvastatin (LIPITOR) 40 MG tablet Take 40 mg by mouth daily at 6 PM.    .  cholecalciferol (VITAMIN D) 1000 UNITS tablet Take 1,000 Units by mouth daily.    . furosemide (LASIX) 20 MG tablet Take 20 mg by mouth as needed. Reported on 06/04/2015    . gabapentin (NEURONTIN) 100 MG capsule Take 1 capsule at night for 1 week, then increase to 2 capsules at night for 1 week, then increase to 3 capsules at night and continue 90 capsule 6  . HYDROcodone-acetaminophen (NORCO/VICODIN) 5-325 MG tablet Takes 2 tablets every 6 hours  0  . levothyroxine (SYNTHROID, LEVOTHROID) 75 MCG tablet Take 75 mcg by mouth daily.     . metoprolol tartrate (LOPRESSOR) 25 MG tablet Take 25 mg by mouth 2 (two) times daily.     Vladimir Faster Glycol-Propyl Glycol (SYSTANE OP) Apply 2 drops to eye 4 (four) times daily as needed. For dry eyes.    . polyethylene glycol (MIRALAX / GLYCOLAX) packet Take 17 g by mouth every other day.     . rivastigmine (EXELON) 9.5 mg/24hr Place 1 patch (9.5 mg total) onto the skin daily. 30 patch 12  . solifenacin (VESICARE) 5 MG tablet Take 5 mg  by mouth daily.    . TOVIAZ 8 MG TB24 tablet 8 mg. Take 1 tablet daily  1  . warfarin (COUMADIN) 2.5 MG tablet Take 2.5 mg by mouth daily at 6 PM.     No current facility-administered medications on file prior to visit.     ALLERGIES: Allergies  Allergen Reactions  . Celebrex [Celecoxib] Other (See Comments)    Reaction=anemia/leukopenia  . Diovan [Valsartan] Other (See Comments)    Reaction=increase potassium/acute renal failure    FAMILY HISTORY: Family History  Problem Relation Age of Onset  . Cancer Mother   . Stroke Father   . Cancer Father   . Diabetes Sister   . Heart disease Sister     Heart Disease before age 50  . Cancer Brother   . Heart disease Sister     SOCIAL HISTORY: Social History   Social History  . Marital status: Widowed    Spouse name: N/A  . Number of children: 2  . Years of education: N/A   Occupational History  . Nurse, learning disability [employer]   Social History Main  Topics  . Smoking status: Never Smoker  . Smokeless tobacco: Never Used  . Alcohol use No  . Drug use: No  . Sexual activity: Yes    Birth control/ protection: Post-menopausal   Other Topics Concern  . Not on file   Social History Narrative   Lives alone   Works full time   Still drives and very active usually    REVIEW OF SYSTEMS: Constitutional: No fevers, chills, or sweats, no generalized fatigue, change in appetite Eyes: No visual changes, double vision, eye pain Ear, nose and throat: No hearing loss, ear pain, nasal congestion, sore throat Cardiovascular: No chest pain, palpitations Respiratory:  No shortness of breath at rest or with exertion, wheezes GastrointestinaI: No nausea, vomiting, diarrhea, abdominal pain, fecal incontinence Genitourinary:  No dysuria, urinary retention or frequency Musculoskeletal:  + neck pain, back pain Integumentary: No rash, pruritus, skin lesions Neurological: as above Psychiatric: No depression, insomnia, anxiety Endocrine: No palpitations, fatigue, diaphoresis, mood swings, change in appetite, change in weight, increased thirst Hematologic/Lymphatic:  No anemia, purpura, petechiae. Allergic/Immunologic: no itchy/runny eyes, nasal congestion, recent allergic reactions, rashes  PHYSICAL EXAM: Vitals:   12/06/15 1543  BP: 128/62  Pulse: 69  Temp: 97.6 F (36.4 C)   General: No acute distress Head:  Normocephalic/atraumatic Neck: supple, no paraspinal tenderness, full range of motion Heart:  Regular rate and rhythm Lungs:  Clear to auscultation bilaterally Back: No paraspinal tenderness Skin/Extremities: No rash, no edema Neurological Exam: alert and oriented to person, place, and time. No aphasia or dysarthria. Fund of knowledge is appropriate.  Recent and remote memory are intact. 2/3 delayed recall. Attention and concentration are normal.  Able to name objects and repeat phrases. CDT 5/5 MMSE - Mini Mental State Exam 12/06/2015  10/18/2014  Orientation to time 4 5  Orientation to Place 5 5  Registration 3 3  Attention/ Calculation 5 4  Recall 2 2  Language- name 2 objects 2 2  Language- repeat 1 1  Language- follow 3 step command 3 3  Language- read & follow direction 1 1  Write a sentence 1 1  Copy design 0 1  Total score 27 28   Cranial nerves: Pupils equal, round, reactive to light. Extraocular movements intact with no nystagmus. Visual fields full. Facial sensation intact. No facial asymmetry. Tongue, uvula, palate midline.  Motor: Bulk  and tone normal, muscle strength 5/5 throughout with no pronator drift.  Sensation decreased to pin in a stocking distribution to ankles bilaterally, decreased vibration to knees bilaterally. No extinction to double simultaneous stimulation.  Deep tendon reflexes brisk +2 throughout, toes downgoing.  Finger to nose testing intact.  Gait slow and cautious with walker, favors left knee (similar to prior). Negative Romberg test.  IMPRESSION: This is an 82 yo RH woman with a history of atrial fibrillation, mitral valve repair on chronic anticoagulation, hypertension, hyperlipidemia, peripheral vascular disease, chronic back pain, who initially presented for memory loss, Neuropsychological evaluation indicated Major Vascular Neurocognitive Disorder (vascular dementia). She is on Exelon 9.6mg /24hr patch daily, MMSE today 27/30, continue current dose. She has noticed possibly some improvement with gabapentin but daughter is hesitant to take daytime medication due to potential drowsiness. She will try increasing to 400mg  qhs and let us know if more helpful. She will follow-up in 6 months and knows to call our office for any changes.   Thank you for allowing me to participate in her care.  Please do not hesitate to call for any questions or concerns.  The duration of this appointment visit was 25 minutes of face-to-face time with the patient.  Greater than 50% of this time was spent in  counseling, explanation of diagnosis, planning of further management, and coordination of care.   Ellouise Newer, M.D.   CC: Dr. Orland Mustard

## 2015-12-06 NOTE — Patient Instructions (Signed)
1. Continue Exelon patch 2. Try increasing gabapentin 100mg : Take 4 capsules at night. After a week, if you notice a difference, call our office so we can send a new prescription with updated dose. If no difference, go back to 3 capsules at night. 3. Follow-up in 6 months, call for any changes

## 2015-12-10 ENCOUNTER — Ambulatory Visit (INDEPENDENT_AMBULATORY_CARE_PROVIDER_SITE_OTHER): Payer: PPO | Admitting: Sports Medicine

## 2015-12-10 ENCOUNTER — Encounter: Payer: Self-pay | Admitting: Sports Medicine

## 2015-12-10 DIAGNOSIS — L600 Ingrowing nail: Secondary | ICD-10-CM

## 2015-12-10 DIAGNOSIS — M79674 Pain in right toe(s): Secondary | ICD-10-CM

## 2015-12-10 DIAGNOSIS — M79675 Pain in left toe(s): Secondary | ICD-10-CM | POA: Diagnosis not present

## 2015-12-10 DIAGNOSIS — B351 Tinea unguium: Secondary | ICD-10-CM | POA: Diagnosis not present

## 2015-12-10 DIAGNOSIS — M1711 Unilateral primary osteoarthritis, right knee: Secondary | ICD-10-CM | POA: Diagnosis not present

## 2015-12-10 DIAGNOSIS — M25561 Pain in right knee: Secondary | ICD-10-CM | POA: Diagnosis not present
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
## 2015-12-11 DIAGNOSIS — E784 Other hyperlipidemia: Secondary | ICD-10-CM | POA: Diagnosis not present

## 2015-12-11 DIAGNOSIS — I480 Paroxysmal atrial fibrillation: Secondary | ICD-10-CM | POA: Diagnosis not present

## 2015-12-11 DIAGNOSIS — L03116 Cellulitis of left lower limb: Secondary | ICD-10-CM | POA: Diagnosis not present

## 2015-12-11 DIAGNOSIS — I6529 Occlusion and stenosis of unspecified carotid artery: Secondary | ICD-10-CM | POA: Diagnosis not present

## 2015-12-11 DIAGNOSIS — R600 Localized edema: Secondary | ICD-10-CM | POA: Diagnosis not present

## 2015-12-11 DIAGNOSIS — Z954 Presence of other heart-valve replacement: Secondary | ICD-10-CM | POA: Diagnosis not present

## 2015-12-11 DIAGNOSIS — I119 Hypertensive heart disease without heart failure: Secondary | ICD-10-CM | POA: Diagnosis not present

## 2015-12-11 DIAGNOSIS — R609 Edema, unspecified: Secondary | ICD-10-CM | POA: Diagnosis not present

## 2015-12-11 DIAGNOSIS — I739 Peripheral vascular disease, unspecified: Secondary | ICD-10-CM | POA: Diagnosis not present

## 2015-12-11 DIAGNOSIS — R0602 Shortness of breath: Secondary | ICD-10-CM | POA: Diagnosis not present

## 2015-12-11 DIAGNOSIS — Z7901 Long term (current) use of anticoagulants: Secondary | ICD-10-CM | POA: Diagnosis not present

## 2015-12-11 DIAGNOSIS — I511 Rupture of chordae tendineae, not elsewhere classified: Secondary | ICD-10-CM | POA: Diagnosis not present

## 2015-12-12 DIAGNOSIS — N3941 Urge incontinence: Secondary | ICD-10-CM | POA: Diagnosis not present

## 2015-12-18 ENCOUNTER — Telehealth: Payer: Self-pay

## 2015-12-18 DIAGNOSIS — I6529 Occlusion and stenosis of unspecified carotid artery: Secondary | ICD-10-CM | POA: Diagnosis not present

## 2015-12-18 DIAGNOSIS — Z7901 Long term (current) use of anticoagulants: Secondary | ICD-10-CM | POA: Diagnosis not present

## 2015-12-18 DIAGNOSIS — I511 Rupture of chordae tendineae, not elsewhere classified: Secondary | ICD-10-CM | POA: Diagnosis not present

## 2015-12-18 DIAGNOSIS — Z954 Presence of other heart-valve replacement: Secondary | ICD-10-CM | POA: Diagnosis not present

## 2015-12-18 DIAGNOSIS — I480 Paroxysmal atrial fibrillation: Secondary | ICD-10-CM | POA: Diagnosis not present

## 2015-12-18 DIAGNOSIS — I119 Hypertensive heart disease without heart failure: Secondary | ICD-10-CM | POA: Diagnosis not present

## 2015-12-18 DIAGNOSIS — I739 Peripheral vascular disease, unspecified: Secondary | ICD-10-CM | POA: Diagnosis not present

## 2015-12-18 DIAGNOSIS — R0602 Shortness of breath: Secondary | ICD-10-CM | POA: Diagnosis not present

## 2015-12-18 DIAGNOSIS — R600 Localized edema: Secondary | ICD-10-CM | POA: Diagnosis not present

## 2015-12-18 DIAGNOSIS — R609 Edema, unspecified: Secondary | ICD-10-CM | POA: Diagnosis not present

## 2015-12-18 DIAGNOSIS — E784 Other hyperlipidemia: Secondary | ICD-10-CM | POA: Diagnosis not present

## 2015-12-18 DIAGNOSIS — L03116 Cellulitis of left lower limb: Secondary | ICD-10-CM | POA: Diagnosis not present

## 2015-12-18 NOTE — Telephone Encounter (Signed)
Patient's daughter called while at another doctors appt to verify that patient is prescribed Gabapentin 100mg  4 capsules at night.

## 2015-12-20 DIAGNOSIS — I480 Paroxysmal atrial fibrillation: Secondary | ICD-10-CM | POA: Diagnosis not present

## 2015-12-20 DIAGNOSIS — E784 Other hyperlipidemia: Secondary | ICD-10-CM | POA: Diagnosis not present

## 2015-12-20 DIAGNOSIS — I511 Rupture of chordae tendineae, not elsewhere classified: Secondary | ICD-10-CM | POA: Diagnosis not present

## 2015-12-20 DIAGNOSIS — R600 Localized edema: Secondary | ICD-10-CM | POA: Diagnosis not present

## 2015-12-20 DIAGNOSIS — I739 Peripheral vascular disease, unspecified: Secondary | ICD-10-CM | POA: Diagnosis not present

## 2015-12-20 DIAGNOSIS — Z954 Presence of other heart-valve replacement: Secondary | ICD-10-CM | POA: Diagnosis not present

## 2015-12-20 DIAGNOSIS — I6529 Occlusion and stenosis of unspecified carotid artery: Secondary | ICD-10-CM | POA: Diagnosis not present

## 2015-12-20 DIAGNOSIS — Z7901 Long term (current) use of anticoagulants: Secondary | ICD-10-CM | POA: Diagnosis not present

## 2015-12-20 DIAGNOSIS — R609 Edema, unspecified: Secondary | ICD-10-CM | POA: Diagnosis not present

## 2015-12-20 DIAGNOSIS — I119 Hypertensive heart disease without heart failure: Secondary | ICD-10-CM | POA: Diagnosis not present

## 2015-12-20 DIAGNOSIS — L03116 Cellulitis of left lower limb: Secondary | ICD-10-CM | POA: Diagnosis not present

## 2015-12-20 DIAGNOSIS — R0602 Shortness of breath: Secondary | ICD-10-CM | POA: Diagnosis not present

## 2015-12-23 DIAGNOSIS — R600 Localized edema: Secondary | ICD-10-CM | POA: Diagnosis not present

## 2015-12-23 DIAGNOSIS — I480 Paroxysmal atrial fibrillation: Secondary | ICD-10-CM | POA: Diagnosis not present

## 2015-12-23 DIAGNOSIS — R609 Edema, unspecified: Secondary | ICD-10-CM | POA: Diagnosis not present

## 2015-12-23 DIAGNOSIS — L03116 Cellulitis of left lower limb: Secondary | ICD-10-CM | POA: Diagnosis not present

## 2015-12-23 DIAGNOSIS — I6529 Occlusion and stenosis of unspecified carotid artery: Secondary | ICD-10-CM | POA: Diagnosis not present

## 2015-12-23 DIAGNOSIS — E784 Other hyperlipidemia: Secondary | ICD-10-CM | POA: Diagnosis not present

## 2015-12-23 DIAGNOSIS — I119 Hypertensive heart disease without heart failure: Secondary | ICD-10-CM | POA: Diagnosis not present

## 2015-12-23 DIAGNOSIS — Z7901 Long term (current) use of anticoagulants: Secondary | ICD-10-CM | POA: Diagnosis not present

## 2015-12-23 DIAGNOSIS — R0602 Shortness of breath: Secondary | ICD-10-CM | POA: Diagnosis not present

## 2015-12-23 DIAGNOSIS — I511 Rupture of chordae tendineae, not elsewhere classified: Secondary | ICD-10-CM | POA: Diagnosis not present

## 2015-12-23 DIAGNOSIS — I739 Peripheral vascular disease, unspecified: Secondary | ICD-10-CM | POA: Diagnosis not present

## 2015-12-23 DIAGNOSIS — Z954 Presence of other heart-valve replacement: Secondary | ICD-10-CM | POA: Diagnosis not present

## 2015-12-26 DIAGNOSIS — Z1231 Encounter for screening mammogram for malignant neoplasm of breast: Secondary | ICD-10-CM | POA: Diagnosis not present

## 2016-01-02 DIAGNOSIS — N3941 Urge incontinence: Secondary | ICD-10-CM | POA: Diagnosis not present

## 2016-01-07 DIAGNOSIS — R609 Edema, unspecified: Secondary | ICD-10-CM | POA: Diagnosis not present

## 2016-01-07 DIAGNOSIS — E784 Other hyperlipidemia: Secondary | ICD-10-CM | POA: Diagnosis not present

## 2016-01-07 DIAGNOSIS — I511 Rupture of chordae tendineae, not elsewhere classified: Secondary | ICD-10-CM | POA: Diagnosis not present

## 2016-01-07 DIAGNOSIS — R0602 Shortness of breath: Secondary | ICD-10-CM | POA: Diagnosis not present

## 2016-01-07 DIAGNOSIS — I119 Hypertensive heart disease without heart failure: Secondary | ICD-10-CM | POA: Diagnosis not present

## 2016-01-07 DIAGNOSIS — Z954 Presence of other heart-valve replacement: Secondary | ICD-10-CM | POA: Diagnosis not present

## 2016-01-07 DIAGNOSIS — I6529 Occlusion and stenosis of unspecified carotid artery: Secondary | ICD-10-CM | POA: Diagnosis not present

## 2016-01-07 DIAGNOSIS — R600 Localized edema: Secondary | ICD-10-CM | POA: Diagnosis not present

## 2016-01-07 DIAGNOSIS — I739 Peripheral vascular disease, unspecified: Secondary | ICD-10-CM | POA: Diagnosis not present

## 2016-01-07 DIAGNOSIS — Z7901 Long term (current) use of anticoagulants: Secondary | ICD-10-CM | POA: Diagnosis not present

## 2016-01-07 DIAGNOSIS — L03116 Cellulitis of left lower limb: Secondary | ICD-10-CM | POA: Diagnosis not present

## 2016-01-07 DIAGNOSIS — I480 Paroxysmal atrial fibrillation: Secondary | ICD-10-CM | POA: Diagnosis not present

## 2016-01-08 ENCOUNTER — Encounter: Payer: Self-pay | Admitting: Family

## 2016-01-08 DIAGNOSIS — R351 Nocturia: Secondary | ICD-10-CM | POA: Diagnosis not present

## 2016-01-08 DIAGNOSIS — N3941 Urge incontinence: Secondary | ICD-10-CM | POA: Diagnosis not present

## 2016-01-13 DIAGNOSIS — I480 Paroxysmal atrial fibrillation: Secondary | ICD-10-CM | POA: Diagnosis not present

## 2016-01-13 DIAGNOSIS — Z954 Presence of other heart-valve replacement: Secondary | ICD-10-CM | POA: Diagnosis not present

## 2016-01-13 DIAGNOSIS — R609 Edema, unspecified: Secondary | ICD-10-CM | POA: Diagnosis not present

## 2016-01-13 DIAGNOSIS — L03116 Cellulitis of left lower limb: Secondary | ICD-10-CM | POA: Diagnosis not present

## 2016-01-13 DIAGNOSIS — I511 Rupture of chordae tendineae, not elsewhere classified: Secondary | ICD-10-CM | POA: Diagnosis not present

## 2016-01-13 DIAGNOSIS — E784 Other hyperlipidemia: Secondary | ICD-10-CM | POA: Diagnosis not present

## 2016-01-13 DIAGNOSIS — I739 Peripheral vascular disease, unspecified: Secondary | ICD-10-CM | POA: Diagnosis not present

## 2016-01-13 DIAGNOSIS — I119 Hypertensive heart disease without heart failure: Secondary | ICD-10-CM | POA: Diagnosis not present

## 2016-01-13 DIAGNOSIS — Z7901 Long term (current) use of anticoagulants: Secondary | ICD-10-CM | POA: Diagnosis not present

## 2016-01-13 DIAGNOSIS — R600 Localized edema: Secondary | ICD-10-CM | POA: Diagnosis not present

## 2016-01-13 DIAGNOSIS — R0602 Shortness of breath: Secondary | ICD-10-CM | POA: Diagnosis not present

## 2016-01-13 DIAGNOSIS — I6529 Occlusion and stenosis of unspecified carotid artery: Secondary | ICD-10-CM | POA: Diagnosis not present

## 2016-01-14 ENCOUNTER — Ambulatory Visit: Payer: PPO | Admitting: Family

## 2016-01-14 ENCOUNTER — Inpatient Hospital Stay (HOSPITAL_COMMUNITY): Admission: RE | Admit: 2016-01-14 | Payer: PPO | Source: Ambulatory Visit

## 2016-01-21 ENCOUNTER — Ambulatory Visit: Payer: PPO | Admitting: Sports Medicine

## 2016-01-21 ENCOUNTER — Ambulatory Visit (INDEPENDENT_AMBULATORY_CARE_PROVIDER_SITE_OTHER): Payer: PPO | Admitting: Sports Medicine

## 2016-01-21 ENCOUNTER — Encounter: Payer: Self-pay | Admitting: Sports Medicine

## 2016-01-21 DIAGNOSIS — M79674 Pain in right toe(s): Secondary | ICD-10-CM | POA: Diagnosis not present

## 2016-01-21 DIAGNOSIS — M79675 Pain in left toe(s): Secondary | ICD-10-CM

## 2016-01-21 DIAGNOSIS — B351 Tinea unguium: Secondary | ICD-10-CM | POA: Diagnosis not present

## 2016-01-21 DIAGNOSIS — M7741 Metatarsalgia, right foot: Secondary | ICD-10-CM

## 2016-01-21 DIAGNOSIS — L600 Ingrowing nail: Secondary | ICD-10-CM

## 2016-01-21 DIAGNOSIS — R269 Unspecified abnormalities of gait and mobility: Secondary | ICD-10-CM

## 2016-01-21 DIAGNOSIS — M7742 Metatarsalgia, left foot: Secondary | ICD-10-CM
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
## 2016-01-22 DIAGNOSIS — I739 Peripheral vascular disease, unspecified: Secondary | ICD-10-CM | POA: Diagnosis not present

## 2016-01-22 DIAGNOSIS — R0602 Shortness of breath: Secondary | ICD-10-CM | POA: Diagnosis not present

## 2016-01-22 DIAGNOSIS — I119 Hypertensive heart disease without heart failure: Secondary | ICD-10-CM | POA: Diagnosis not present

## 2016-01-22 DIAGNOSIS — R609 Edema, unspecified: Secondary | ICD-10-CM | POA: Diagnosis not present

## 2016-01-22 DIAGNOSIS — Z954 Presence of other heart-valve replacement: Secondary | ICD-10-CM | POA: Diagnosis not present

## 2016-01-22 DIAGNOSIS — L03116 Cellulitis of left lower limb: Secondary | ICD-10-CM | POA: Diagnosis not present

## 2016-01-22 DIAGNOSIS — I511 Rupture of chordae tendineae, not elsewhere classified: Secondary | ICD-10-CM | POA: Diagnosis not present

## 2016-01-22 DIAGNOSIS — Z7901 Long term (current) use of anticoagulants: Secondary | ICD-10-CM | POA: Diagnosis not present

## 2016-01-22 DIAGNOSIS — I480 Paroxysmal atrial fibrillation: Secondary | ICD-10-CM | POA: Diagnosis not present

## 2016-01-22 DIAGNOSIS — I6529 Occlusion and stenosis of unspecified carotid artery: Secondary | ICD-10-CM | POA: Diagnosis not present

## 2016-01-22 DIAGNOSIS — R600 Localized edema: Secondary | ICD-10-CM | POA: Diagnosis not present

## 2016-01-22 DIAGNOSIS — E784 Other hyperlipidemia: Secondary | ICD-10-CM | POA: Diagnosis not present

## 2016-01-23 DIAGNOSIS — N3941 Urge incontinence: Secondary | ICD-10-CM | POA: Diagnosis not present

## 2016-02-07 DIAGNOSIS — Z954 Presence of other heart-valve replacement: Secondary | ICD-10-CM | POA: Diagnosis not present

## 2016-02-07 DIAGNOSIS — R600 Localized edema: Secondary | ICD-10-CM | POA: Diagnosis not present

## 2016-02-07 DIAGNOSIS — I480 Paroxysmal atrial fibrillation: Secondary | ICD-10-CM | POA: Diagnosis not present

## 2016-02-07 DIAGNOSIS — I119 Hypertensive heart disease without heart failure: Secondary | ICD-10-CM | POA: Diagnosis not present

## 2016-02-07 DIAGNOSIS — L03116 Cellulitis of left lower limb: Secondary | ICD-10-CM | POA: Diagnosis not present

## 2016-02-07 DIAGNOSIS — E784 Other hyperlipidemia: Secondary | ICD-10-CM | POA: Diagnosis not present

## 2016-02-07 DIAGNOSIS — I6529 Occlusion and stenosis of unspecified carotid artery: Secondary | ICD-10-CM | POA: Diagnosis not present

## 2016-02-07 DIAGNOSIS — I739 Peripheral vascular disease, unspecified: Secondary | ICD-10-CM | POA: Diagnosis not present

## 2016-02-07 DIAGNOSIS — R0602 Shortness of breath: Secondary | ICD-10-CM | POA: Diagnosis not present

## 2016-02-07 DIAGNOSIS — Z7901 Long term (current) use of anticoagulants: Secondary | ICD-10-CM | POA: Diagnosis not present

## 2016-02-07 DIAGNOSIS — I511 Rupture of chordae tendineae, not elsewhere classified: Secondary | ICD-10-CM | POA: Diagnosis not present

## 2016-02-07 DIAGNOSIS — R609 Edema, unspecified: Secondary | ICD-10-CM | POA: Diagnosis not present

## 2016-02-13 DIAGNOSIS — N3941 Urge incontinence: Secondary | ICD-10-CM | POA: Diagnosis not present

## 2016-02-21 DIAGNOSIS — Z954 Presence of other heart-valve replacement: Secondary | ICD-10-CM | POA: Diagnosis not present

## 2016-02-21 DIAGNOSIS — I480 Paroxysmal atrial fibrillation: Secondary | ICD-10-CM | POA: Diagnosis not present

## 2016-02-21 DIAGNOSIS — I739 Peripheral vascular disease, unspecified: Secondary | ICD-10-CM | POA: Diagnosis not present

## 2016-02-21 DIAGNOSIS — R0602 Shortness of breath: Secondary | ICD-10-CM | POA: Diagnosis not present

## 2016-02-21 DIAGNOSIS — R609 Edema, unspecified: Secondary | ICD-10-CM | POA: Diagnosis not present

## 2016-02-21 DIAGNOSIS — I119 Hypertensive heart disease without heart failure: Secondary | ICD-10-CM | POA: Diagnosis not present

## 2016-02-21 DIAGNOSIS — R600 Localized edema: Secondary | ICD-10-CM | POA: Diagnosis not present

## 2016-02-21 DIAGNOSIS — I6529 Occlusion and stenosis of unspecified carotid artery: Secondary | ICD-10-CM | POA: Diagnosis not present

## 2016-02-21 DIAGNOSIS — I511 Rupture of chordae tendineae, not elsewhere classified: Secondary | ICD-10-CM | POA: Diagnosis not present

## 2016-02-21 DIAGNOSIS — E784 Other hyperlipidemia: Secondary | ICD-10-CM | POA: Diagnosis not present

## 2016-02-21 DIAGNOSIS — Z7901 Long term (current) use of anticoagulants: Secondary | ICD-10-CM | POA: Diagnosis not present

## 2016-02-21 DIAGNOSIS — L03116 Cellulitis of left lower limb: Secondary | ICD-10-CM | POA: Diagnosis not present

## 2016-02-26 DIAGNOSIS — M170 Bilateral primary osteoarthritis of knee: Secondary | ICD-10-CM | POA: Diagnosis not present

## 2016-02-26 DIAGNOSIS — M25511 Pain in right shoulder: Secondary | ICD-10-CM | POA: Diagnosis not present

## 2016-02-26 DIAGNOSIS — M1711 Unilateral primary osteoarthritis, right knee: Secondary | ICD-10-CM | POA: Diagnosis not present

## 2016-02-26 DIAGNOSIS — G8929 Other chronic pain: Secondary | ICD-10-CM | POA: Diagnosis not present

## 2016-02-26 DIAGNOSIS — M1712 Unilateral primary osteoarthritis, left knee: Secondary | ICD-10-CM | POA: Diagnosis not present

## 2016-02-27 DIAGNOSIS — R609 Edema, unspecified: Secondary | ICD-10-CM | POA: Diagnosis not present

## 2016-02-27 DIAGNOSIS — L03116 Cellulitis of left lower limb: Secondary | ICD-10-CM | POA: Diagnosis not present

## 2016-02-27 DIAGNOSIS — Z7901 Long term (current) use of anticoagulants: Secondary | ICD-10-CM | POA: Diagnosis not present

## 2016-02-27 DIAGNOSIS — I119 Hypertensive heart disease without heart failure: Secondary | ICD-10-CM | POA: Diagnosis not present

## 2016-02-27 DIAGNOSIS — E784 Other hyperlipidemia: Secondary | ICD-10-CM | POA: Diagnosis not present

## 2016-02-27 DIAGNOSIS — I739 Peripheral vascular disease, unspecified: Secondary | ICD-10-CM | POA: Diagnosis not present

## 2016-02-27 DIAGNOSIS — R0602 Shortness of breath: Secondary | ICD-10-CM | POA: Diagnosis not present

## 2016-02-27 DIAGNOSIS — I511 Rupture of chordae tendineae, not elsewhere classified: Secondary | ICD-10-CM | POA: Diagnosis not present

## 2016-02-27 DIAGNOSIS — Z954 Presence of other heart-valve replacement: Secondary | ICD-10-CM | POA: Diagnosis not present

## 2016-02-27 DIAGNOSIS — I480 Paroxysmal atrial fibrillation: Secondary | ICD-10-CM | POA: Diagnosis not present

## 2016-02-27 DIAGNOSIS — R600 Localized edema: Secondary | ICD-10-CM | POA: Diagnosis not present

## 2016-02-27 DIAGNOSIS — I6529 Occlusion and stenosis of unspecified carotid artery: Secondary | ICD-10-CM | POA: Diagnosis not present

## 2016-02-28 DIAGNOSIS — R2689 Other abnormalities of gait and mobility: Secondary | ICD-10-CM | POA: Diagnosis not present

## 2016-02-28 DIAGNOSIS — H43813 Vitreous degeneration, bilateral: Secondary | ICD-10-CM | POA: Diagnosis not present

## 2016-02-28 DIAGNOSIS — H35373 Puckering of macula, bilateral: Secondary | ICD-10-CM | POA: Diagnosis not present

## 2016-02-28 DIAGNOSIS — M7742 Metatarsalgia, left foot: Secondary | ICD-10-CM | POA: Diagnosis not present

## 2016-02-28 DIAGNOSIS — H43823 Vitreomacular adhesion, bilateral: Secondary | ICD-10-CM | POA: Diagnosis not present

## 2016-03-03 ENCOUNTER — Encounter: Payer: Self-pay | Admitting: Family

## 2016-03-03 ENCOUNTER — Ambulatory Visit (HOSPITAL_COMMUNITY)
Admission: RE | Admit: 2016-03-03 | Discharge: 2016-03-03 | Disposition: A | Discharge status: Home / Self Care | Payer: PPO | Source: Ambulatory Visit | Attending: Family | Admitting: Family

## 2016-03-03 ENCOUNTER — Ambulatory Visit: Payer: PPO | Admitting: Sports Medicine

## 2016-03-03 ENCOUNTER — Ambulatory Visit (INDEPENDENT_AMBULATORY_CARE_PROVIDER_SITE_OTHER): Payer: PPO | Admitting: Family

## 2016-03-03 ENCOUNTER — Encounter: Payer: Self-pay | Admitting: Sports Medicine

## 2016-03-03 ENCOUNTER — Ambulatory Visit (INDEPENDENT_AMBULATORY_CARE_PROVIDER_SITE_OTHER): Payer: PPO | Admitting: Sports Medicine

## 2016-03-03 VITALS — BP 140/66 | HR 77 | Temp 97.8°F | Resp 14 | Ht 59.0 in | Wt 114.0 lb

## 2016-03-03 DIAGNOSIS — I6522 Occlusion and stenosis of left carotid artery: Secondary | ICD-10-CM | POA: Diagnosis not present

## 2016-03-03 DIAGNOSIS — Z48812 Encounter for surgical aftercare following surgery on the circulatory system: Secondary | ICD-10-CM | POA: Diagnosis not present

## 2016-03-03 DIAGNOSIS — I6523 Occlusion and stenosis of bilateral carotid arteries: Secondary | ICD-10-CM

## 2016-03-03 DIAGNOSIS — B351 Tinea unguium: Secondary | ICD-10-CM | POA: Diagnosis not present

## 2016-03-03 DIAGNOSIS — M79675 Pain in left toe(s): Secondary | ICD-10-CM | POA: Diagnosis not present

## 2016-03-03 DIAGNOSIS — Z9889 Other specified postprocedural states: Secondary | ICD-10-CM | POA: Diagnosis not present

## 2016-03-03 DIAGNOSIS — M79674 Pain in right toe(s): Secondary | ICD-10-CM

## 2016-03-03 DIAGNOSIS — L600 Ingrowing nail: Secondary | ICD-10-CM

## 2016-03-03 LAB — VAS US CAROTID
LCCADSYS: 77 cm/s
LCCAPDIAS: 21 cm/s
LEFT ECA DIAS: 0 cm/s
Left CCA dist dias: 12 cm/s
Left CCA prox sys: 114 cm/s
Left ICA dist dias: -19 cm/s
Left ICA dist sys: -86 cm/s
Left ICA prox dias: 21 cm/s
Left ICA prox sys: 120 cm/s
RCCADSYS: -85 cm/s
RCCAPDIAS: -11 cm/s
RIGHT CCA MID DIAS: 12 cm/s
RIGHT ECA DIAS: 0 cm/s
Right CCA prox sys: -79 cm/s
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
## 2016-03-03 NOTE — Patient Instructions (Signed)
Stroke Prevention Some medical conditions and behaviors are associated with an increased chance of having a stroke. You may prevent a stroke by making healthy choices and managing medical conditions. How can I reduce my risk of having a stroke?  Stay physically active. Get at least 30 minutes of activity on most or all days.  Do not smoke. It may also be helpful to avoid exposure to secondhand smoke.  Limit alcohol use. Moderate alcohol use is considered to be:  No more than 2 drinks per day for men.  No more than 1 drink per day for nonpregnant women.  Eat healthy foods. This involves:  Eating 5 or more servings of fruits and vegetables a day.  Making dietary changes that address high blood pressure (hypertension), high cholesterol, diabetes, or obesity.  Manage your cholesterol levels.  Making food choices that are high in fiber and low in saturated fat, trans fat, and cholesterol may control cholesterol levels.  Take any prescribed medicines to control cholesterol as directed by your health care provider.  Manage your diabetes.  Controlling your carbohydrate and sugar intake is recommended to manage diabetes.  Take any prescribed medicines to control diabetes as directed by your health care provider.  Control your hypertension.  Making food choices that are low in salt (sodium), saturated fat, trans fat, and cholesterol is recommended to manage hypertension.  Ask your health care provider if you need treatment to lower your blood pressure. Take any prescribed medicines to control hypertension as directed by your health care provider.  If you are 18-39 years of age, have your blood pressure checked every 3-5 years. If you are 40 years of age or older, have your blood pressure checked every year.  Maintain a healthy weight.  Reducing calorie intake and making food choices that are low in sodium, saturated fat, trans fat, and cholesterol are recommended to manage  weight.  Stop drug abuse.  Avoid taking birth control pills.  Talk to your health care provider about the risks of taking birth control pills if you are over 35 years old, smoke, get migraines, or have ever had a blood clot.  Get evaluated for sleep disorders (sleep apnea).  Talk to your health care provider about getting a sleep evaluation if you snore a lot or have excessive sleepiness.  Take medicines only as directed by your health care provider.  For some people, aspirin or blood thinners (anticoagulants) are helpful in reducing the risk of forming abnormal blood clots that can lead to stroke. If you have the irregular heart rhythm of atrial fibrillation, you should be on a blood thinner unless there is a good reason you cannot take them.  Understand all your medicine instructions.  Make sure that other conditions (such as anemia or atherosclerosis) are addressed. Get help right away if:  You have sudden weakness or numbness of the face, arm, or leg, especially on one side of the body.  Your face or eyelid droops to one side.  You have sudden confusion.  You have trouble speaking (aphasia) or understanding.  You have sudden trouble seeing in one or both eyes.  You have sudden trouble walking.  You have dizziness.  You have a loss of balance or coordination.  You have a sudden, severe headache with no known cause.  You have new chest pain or an irregular heartbeat. Any of these symptoms may represent a serious problem that is an emergency. Do not wait to see if the symptoms will go away.   Get medical help at once. Call your local emergency services (911 in [place] Do not drive yourself to the hospital. This information is not intended to replace advice given to you by your health care provider. Make sure you discuss any questions you have with your health care provider. Document Released: 03/19/2004 Document Revised: 07/18/2015 Document Reviewed: 08/12/2012 Elsevier  Interactive Patient Education  2017 Elsevier Inc.  

## 2016-03-03 NOTE — Addendum Note (Signed)
Addended by: Lianne Cure A on: 03/03/2016 03:57 PM   Modules accepted: Orders

## 2016-03-04 DIAGNOSIS — M170 Bilateral primary osteoarthritis of knee: Secondary | ICD-10-CM | POA: Diagnosis not present

## 2016-03-10 DIAGNOSIS — I119 Hypertensive heart disease without heart failure: Secondary | ICD-10-CM | POA: Diagnosis not present

## 2016-03-10 DIAGNOSIS — I739 Peripheral vascular disease, unspecified: Secondary | ICD-10-CM | POA: Diagnosis not present

## 2016-03-10 DIAGNOSIS — I6529 Occlusion and stenosis of unspecified carotid artery: Secondary | ICD-10-CM | POA: Diagnosis not present

## 2016-03-10 DIAGNOSIS — Z954 Presence of other heart-valve replacement: Secondary | ICD-10-CM | POA: Diagnosis not present

## 2016-03-10 DIAGNOSIS — E784 Other hyperlipidemia: Secondary | ICD-10-CM | POA: Diagnosis not present

## 2016-03-10 DIAGNOSIS — I480 Paroxysmal atrial fibrillation: Secondary | ICD-10-CM | POA: Diagnosis not present

## 2016-03-10 DIAGNOSIS — R0602 Shortness of breath: Secondary | ICD-10-CM | POA: Diagnosis not present

## 2016-03-10 DIAGNOSIS — R609 Edema, unspecified: Secondary | ICD-10-CM | POA: Diagnosis not present

## 2016-03-10 DIAGNOSIS — R600 Localized edema: Secondary | ICD-10-CM | POA: Diagnosis not present

## 2016-03-10 DIAGNOSIS — L03116 Cellulitis of left lower limb: Secondary | ICD-10-CM | POA: Diagnosis not present

## 2016-03-10 DIAGNOSIS — E039 Hypothyroidism, unspecified: Secondary | ICD-10-CM | POA: Diagnosis not present

## 2016-03-10 DIAGNOSIS — I511 Rupture of chordae tendineae, not elsewhere classified: Secondary | ICD-10-CM | POA: Diagnosis not present

## 2016-03-10 DIAGNOSIS — Z7901 Long term (current) use of anticoagulants: Secondary | ICD-10-CM | POA: Diagnosis not present

## 2016-03-13 ENCOUNTER — Ambulatory Visit
Admission: RE | Admit: 2016-03-13 | Discharge: 2016-03-13 | Disposition: A | Discharge status: Home / Self Care | Payer: No Typology Code available for payment source | Source: Ambulatory Visit | Attending: Cardiology | Admitting: Cardiology

## 2016-03-13 ENCOUNTER — Other Ambulatory Visit: Payer: Self-pay | Admitting: Cardiology

## 2016-03-13 DIAGNOSIS — E039 Hypothyroidism, unspecified: Secondary | ICD-10-CM | POA: Diagnosis not present

## 2016-03-13 DIAGNOSIS — I511 Rupture of chordae tendineae, not elsewhere classified: Secondary | ICD-10-CM | POA: Diagnosis not present

## 2016-03-13 DIAGNOSIS — E784 Other hyperlipidemia: Secondary | ICD-10-CM | POA: Diagnosis not present

## 2016-03-13 DIAGNOSIS — I480 Paroxysmal atrial fibrillation: Secondary | ICD-10-CM | POA: Diagnosis not present

## 2016-03-13 DIAGNOSIS — Z954 Presence of other heart-valve replacement: Secondary | ICD-10-CM | POA: Diagnosis not present

## 2016-03-13 DIAGNOSIS — G44309 Post-traumatic headache, unspecified, not intractable: Secondary | ICD-10-CM

## 2016-03-13 DIAGNOSIS — R51 Headache: Secondary | ICD-10-CM | POA: Diagnosis not present

## 2016-03-13 DIAGNOSIS — M170 Bilateral primary osteoarthritis of knee: Secondary | ICD-10-CM | POA: Diagnosis not present

## 2016-03-13 DIAGNOSIS — Z7901 Long term (current) use of anticoagulants: Secondary | ICD-10-CM | POA: Diagnosis not present

## 2016-03-13 DIAGNOSIS — S0093XA Contusion of unspecified part of head, initial encounter: Secondary | ICD-10-CM | POA: Diagnosis not present

## 2016-03-13 DIAGNOSIS — I739 Peripheral vascular disease, unspecified: Secondary | ICD-10-CM | POA: Diagnosis not present

## 2016-03-13 DIAGNOSIS — I6529 Occlusion and stenosis of unspecified carotid artery: Secondary | ICD-10-CM | POA: Diagnosis not present

## 2016-03-13 DIAGNOSIS — I119 Hypertensive heart disease without heart failure: Secondary | ICD-10-CM | POA: Diagnosis not present

## 2016-03-19 DIAGNOSIS — N3941 Urge incontinence: Secondary | ICD-10-CM | POA: Diagnosis not present

## 2016-03-23 DIAGNOSIS — S51012A Laceration without foreign body of left elbow, initial encounter: Secondary | ICD-10-CM | POA: Diagnosis not present

## 2016-03-23 DIAGNOSIS — W19XXXA Unspecified fall, initial encounter: Secondary | ICD-10-CM | POA: Diagnosis not present

## 2016-03-23 DIAGNOSIS — S40011A Contusion of right shoulder, initial encounter: Secondary | ICD-10-CM | POA: Diagnosis not present

## 2016-03-25 DIAGNOSIS — I511 Rupture of chordae tendineae, not elsewhere classified: Secondary | ICD-10-CM | POA: Diagnosis not present

## 2016-03-25 DIAGNOSIS — I6529 Occlusion and stenosis of unspecified carotid artery: Secondary | ICD-10-CM | POA: Diagnosis not present

## 2016-03-25 DIAGNOSIS — Z7901 Long term (current) use of anticoagulants: Secondary | ICD-10-CM | POA: Diagnosis not present

## 2016-03-25 DIAGNOSIS — Z954 Presence of other heart-valve replacement: Secondary | ICD-10-CM | POA: Diagnosis not present

## 2016-03-25 DIAGNOSIS — G44309 Post-traumatic headache, unspecified, not intractable: Secondary | ICD-10-CM | POA: Diagnosis not present

## 2016-03-25 DIAGNOSIS — I119 Hypertensive heart disease without heart failure: Secondary | ICD-10-CM | POA: Diagnosis not present

## 2016-03-25 DIAGNOSIS — I739 Peripheral vascular disease, unspecified: Secondary | ICD-10-CM | POA: Diagnosis not present

## 2016-03-25 DIAGNOSIS — I480 Paroxysmal atrial fibrillation: Secondary | ICD-10-CM | POA: Diagnosis not present

## 2016-03-25 DIAGNOSIS — E784 Other hyperlipidemia: Secondary | ICD-10-CM | POA: Diagnosis not present

## 2016-03-30 ENCOUNTER — Telehealth: Payer: Self-pay | Admitting: Neurology

## 2016-03-30 MED ORDER — RIVASTIGMINE 4.6 MG/24HR TD PT24: 4.6000 mg | MEDICATED_PATCH | Freq: Every day | TRANSDERMAL | 3 refills | Status: DC

## 2016-03-30 NOTE — Telephone Encounter (Signed)
Pls let daughter know we will send in Rx for Exelon patch 4.6 mg/24 hr and see if she feels better on this. With the emotionality, I think she would benefit from seeing a Geriatric Psychiatrist, Dr. Casimiro Needle has been very helpful for our patients with dementia and mood issues. Would she like [place] to send referral? Thanks

## 2016-03-30 NOTE — Telephone Encounter (Signed)
Patient daughter [name] needs to talk to someone about mother and medication and want is going on with her please call [phone]

## 2016-03-30 NOTE — Telephone Encounter (Signed)
Spoke with daughter Desiree [name]. She states she had got refill report from pharmacy and noticed [name] had not been getting Exelon patch refilled. Daughter not thinking just had [name] put patch on and she started feeling nauseas and crying about things more than normal. She states now she remembers [name] was on lower dose when starting medication. Daughter also states she has noticed patients memory decline a lot the past few months. [name] has had two falls in the past two weeks that daughter thinks has some to do with patients dog. Family did work on moving things around in patients home this past weekend to minimize falls hopefully. Daughter also states there has been someone with [name] most of day the past week. Daughter states sometimes [name] remembers how to use phone but sometimes she forgets. Pt also has medical alert and know she can get help if she uses it. [name] has follow-up 04/20/16 but wants advised on what to do.

## 2016-03-30 NOTE — Telephone Encounter (Signed)
Patients daughter notified new RX sent to pharmacy. Daughter states to hold off on Psychiatry referral.

## 2016-04-01 DIAGNOSIS — E039 Hypothyroidism, unspecified: Secondary | ICD-10-CM | POA: Diagnosis not present

## 2016-04-01 DIAGNOSIS — E559 Vitamin D deficiency, unspecified: Secondary | ICD-10-CM | POA: Diagnosis not present

## 2016-04-01 DIAGNOSIS — S59909A Unspecified injury of unspecified elbow, initial encounter: Secondary | ICD-10-CM | POA: Diagnosis not present

## 2016-04-01 DIAGNOSIS — I10 Essential (primary) hypertension: Secondary | ICD-10-CM | POA: Diagnosis not present

## 2016-04-01 DIAGNOSIS — E785 Hyperlipidemia, unspecified: Secondary | ICD-10-CM | POA: Diagnosis not present

## 2016-04-01 DIAGNOSIS — R296 Repeated falls: Secondary | ICD-10-CM | POA: Diagnosis not present

## 2016-04-01 DIAGNOSIS — D649 Anemia, unspecified: Secondary | ICD-10-CM | POA: Diagnosis not present

## 2016-04-08 DIAGNOSIS — M170 Bilateral primary osteoarthritis of knee: Secondary | ICD-10-CM | POA: Diagnosis not present

## 2016-04-08 DIAGNOSIS — Z9181 History of falling: Secondary | ICD-10-CM | POA: Diagnosis not present

## 2016-04-08 DIAGNOSIS — M4123 Other idiopathic scoliosis, cervicothoracic region: Secondary | ICD-10-CM | POA: Diagnosis not present

## 2016-04-08 DIAGNOSIS — R296 Repeated falls: Secondary | ICD-10-CM | POA: Diagnosis not present

## 2016-04-09 DIAGNOSIS — I511 Rupture of chordae tendineae, not elsewhere classified: Secondary | ICD-10-CM | POA: Diagnosis not present

## 2016-04-09 DIAGNOSIS — I739 Peripheral vascular disease, unspecified: Secondary | ICD-10-CM | POA: Diagnosis not present

## 2016-04-09 DIAGNOSIS — I6529 Occlusion and stenosis of unspecified carotid artery: Secondary | ICD-10-CM | POA: Diagnosis not present

## 2016-04-09 DIAGNOSIS — Z954 Presence of other heart-valve replacement: Secondary | ICD-10-CM | POA: Diagnosis not present

## 2016-04-09 DIAGNOSIS — Z7901 Long term (current) use of anticoagulants: Secondary | ICD-10-CM | POA: Diagnosis not present

## 2016-04-09 DIAGNOSIS — I119 Hypertensive heart disease without heart failure: Secondary | ICD-10-CM | POA: Diagnosis not present

## 2016-04-09 DIAGNOSIS — E784 Other hyperlipidemia: Secondary | ICD-10-CM | POA: Diagnosis not present

## 2016-04-09 DIAGNOSIS — G44309 Post-traumatic headache, unspecified, not intractable: Secondary | ICD-10-CM | POA: Diagnosis not present

## 2016-04-09 DIAGNOSIS — I480 Paroxysmal atrial fibrillation: Secondary | ICD-10-CM | POA: Diagnosis not present

## 2016-04-09 DIAGNOSIS — N3941 Urge incontinence: Secondary | ICD-10-CM | POA: Diagnosis not present

## 2016-04-14 ENCOUNTER — Ambulatory Visit (INDEPENDENT_AMBULATORY_CARE_PROVIDER_SITE_OTHER): Payer: PPO | Admitting: Sports Medicine

## 2016-04-14 ENCOUNTER — Encounter: Payer: Self-pay | Admitting: Sports Medicine

## 2016-04-14 DIAGNOSIS — M79675 Pain in left toe(s): Secondary | ICD-10-CM

## 2016-04-14 DIAGNOSIS — M79674 Pain in right toe(s): Secondary | ICD-10-CM | POA: Diagnosis not present

## 2016-04-14 DIAGNOSIS — B351 Tinea unguium: Secondary | ICD-10-CM

## 2016-04-14 DIAGNOSIS — L600 Ingrowing nail: Secondary | ICD-10-CM

## 2016-04-14 DIAGNOSIS — R296 Repeated falls: Secondary | ICD-10-CM | POA: Diagnosis not present

## 2016-04-14 DIAGNOSIS — M4123 Other idiopathic scoliosis, cervicothoracic region: Secondary | ICD-10-CM | POA: Diagnosis not present

## 2016-04-14 DIAGNOSIS — M170 Bilateral primary osteoarthritis of knee: Secondary | ICD-10-CM | POA: Diagnosis not present

## 2016-04-14 DIAGNOSIS — Z9181 History of falling: Secondary | ICD-10-CM | POA: Diagnosis not present
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
## 2016-04-16 DIAGNOSIS — R296 Repeated falls: Secondary | ICD-10-CM | POA: Diagnosis not present

## 2016-04-16 DIAGNOSIS — M4123 Other idiopathic scoliosis, cervicothoracic region: Secondary | ICD-10-CM | POA: Diagnosis not present

## 2016-04-16 DIAGNOSIS — Z9181 History of falling: Secondary | ICD-10-CM | POA: Diagnosis not present

## 2016-04-16 DIAGNOSIS — M170 Bilateral primary osteoarthritis of knee: Secondary | ICD-10-CM | POA: Diagnosis not present

## 2016-04-17 ENCOUNTER — Telehealth: Payer: Self-pay | Admitting: Neurology

## 2016-04-17 ENCOUNTER — Other Ambulatory Visit: Payer: Self-pay

## 2016-04-17 MED ORDER — MEMANTINE HCL 10 MG PO TABS: ORAL_TABLET | ORAL | 5 refills | Status: DC

## 2016-04-17 NOTE — Telephone Encounter (Signed)
Does she want to start [place], it works on a different receptor in the brain. Some people can feel dizzy when first starting it. Try Namenda 10mg  daily. Thanks

## 2016-04-17 NOTE — Telephone Encounter (Signed)
Pls let daughter know we will increase back on the 9.6mg /24 hr dose, however these medications do not prevent the worsening of dementia, they can only slow it down, and it does not help everyone. If she is unsafe to be at home, would start looking into Memory Care facilities if she does not want a caregiver in her house. Thanks

## 2016-04-17 NOTE — Telephone Encounter (Signed)
Daughter notified Rx sent to pharmacy  

## 2016-04-17 NOTE — Telephone Encounter (Signed)
Patients daughter states she had stopped this medication and started her back on the low dose and she had a reaction. Patient is not taking medication at all now. Wants to know if she can have a different medication called in.

## 2016-04-17 NOTE — Telephone Encounter (Signed)
Please advise 

## 2016-04-17 NOTE — Telephone Encounter (Signed)
PT's daughter [name] called and wanted a call back about an increase in medication because her memory is getting really bad/Dawn   CB# [phone]

## 2016-04-20 ENCOUNTER — Ambulatory Visit: Payer: PPO | Admitting: Neurology

## 2016-04-22 DIAGNOSIS — Z9181 History of falling: Secondary | ICD-10-CM | POA: Diagnosis not present

## 2016-04-22 DIAGNOSIS — M170 Bilateral primary osteoarthritis of knee: Secondary | ICD-10-CM | POA: Diagnosis not present

## 2016-04-22 DIAGNOSIS — M4123 Other idiopathic scoliosis, cervicothoracic region: Secondary | ICD-10-CM | POA: Diagnosis not present

## 2016-04-22 DIAGNOSIS — R296 Repeated falls: Secondary | ICD-10-CM | POA: Diagnosis not present

## 2016-04-27 ENCOUNTER — Ambulatory Visit (INDEPENDENT_AMBULATORY_CARE_PROVIDER_SITE_OTHER): Payer: PPO | Admitting: Neurology

## 2016-04-27 ENCOUNTER — Encounter: Payer: Self-pay | Admitting: Neurology

## 2016-04-27 VITALS — BP 130/68 | HR 74 | Temp 98.1°F | Resp 16 | Ht <= 58 in | Wt 116.1 lb

## 2016-04-27 DIAGNOSIS — F0150 Vascular dementia without behavioral disturbance: Secondary | ICD-10-CM | POA: Diagnosis not present

## 2016-04-27 DIAGNOSIS — G609 Hereditary and idiopathic neuropathy, unspecified: Secondary | ICD-10-CM

## 2016-04-27 DIAGNOSIS — R2681 Unsteadiness on feet: Secondary | ICD-10-CM | POA: Diagnosis not present

## 2016-04-27 DIAGNOSIS — F320 Major depressive disorder, single episode, mild: Secondary | ICD-10-CM

## 2016-04-27 DIAGNOSIS — F01A0 Vascular dementia, mild, without behavioral disturbance, psychotic disturbance, mood disturbance, and anxiety: Secondary | ICD-10-CM

## 2016-04-27 MED ORDER — ESCITALOPRAM OXALATE 5 MG PO TABS: ORAL_TABLET | ORAL | 11 refills | Status: DC

## 2016-04-27 NOTE — Progress Notes (Signed)
NEUROLOGY FOLLOW UP OFFICE NOTE  Desiree [name] [id number]  HISTORY OF PRESENT ILLNESS: I had the pleasure of seeing Desiree [name] in follow-up in the neurology clinic on 04/27/2016. She was last seen 5 months ago for vascular dementia. Her daughter spoke to me separately to report changes she has noticed. She had called our office last month to report that she noticed the Exelon patch was not getting refilled. She restarted it and her mother started to become nauseated and more emotional. She asked for a lower dose but still had the same symptoms. Namenda was started, which she is overall tolerating. Her mother's memory continues to decline, her daughter is now in charge of medications after her INR was going up and down. She loses her MedAlert frequently. She has had 2 falls in a 2 week period, but denies any falls today. Last fall was 3 weeks ago. There is usually someone in the house with her all day now. She is sleeping better at night, she has not been complaining about the painful neuropathy in her feet as much. She takes 3 gabapentin at night. She is able to dress and bathe but caregiver helps her now.   HPI 10/17/14: This is an 83 yo RH woman with a history of atrial fibrillation, mitral valve repair on chronic anticoagulation with Coumadin, hypertension, hyperlipidemia, right carotid stenosis, chronic back pain, who presented for worsening memory and confusion. The patient feels that she has great memory. She does have problems remembering names or would forget what she went to do, but denies getting lost driving, no missed bill payments or missed medications. She lives by herself. Her daughter wrote a 2-page letter detailing her concerns which I will summarize as follows. She states her mother is not aware or is unwilling to admit she has issues with processing information, simple instructions, short term memory, or compromised judgement. She only agreed to come to today's visit after 2 recent  events. The first occurred last 09/24/14 after she got cortisone injections to her knees. That evening, her daughter called her and noted she was very disoriented and confused. She thought it was a different day and was adamant about it. She was fully dressed and had woken up in her bed, which is unusual for her to take naps in her bed. She did not understand why she had done this. She called her 45 minutes later and seemed better. The next incident occurred on 10/06/14 around 45 minutes after they had left her house, she called and was very upset, confused, reporting bleeding on her face. They came back and found one of her eyeglass lenses on the floor about 5 feet away from her recliner. The frames were bent out of shape. She did not know what had happened. She was brought to the ER where head CT was done which I personally reviewed, showing generalized diffuse atrophy with ventricular dilatation and moderate chronic microvascular disease confluent around the lateral ventricles, stable from 2015. No acute changes seen. She was discharged home back to mental baseline but reporting headaches. Prior to these events, family has noticed difficulty doing tasks that are familiar to her. She does have trouble with new technology, which is not unusual with regards to the iPhone, but her daughter is concerned that she cannot set the timer on her stove despite repeated lessons and detailed illustration. She constantly loses her keys and pocketbook. She repeats the same stories. A few months ago, the patient was very concerned that her  bottle of hydrocodone had disappeared. They found the pills in her nightstand but could not find the bottle. She has demonstrated poor judgement in many situations and is very moody and depressed but would not admit to it. She is teary, in constant pain. She is fiercely independent and resilient. She does not sleep well. She denies any alcohol intake or significant head injuries. Her younger  sister had memory issues. Her brother had seizures. She had a normal birth and early development, no history of CNS infections, febrile seizures, or neurosurgical procedures.   Diagnostic Data: Neuropsychological evaluation at [facility]: Overall, her neurocognitive profile was felt to be functionally consistent with a diagnosis of Major Vascular Neurocognitive Disorder. It was felt that NPH was unlikely, as well as Dementia with Lewy Bodies. Given the cerebrovascular changes and ischemia on neuroimaging, vascular disease seems a more probable etiology. Recommendation was consideration for nootropics to help with cognitive efficiency, as well as refraining from driving. Certain memory strategies were given.   PAST MEDICAL HISTORY: Past Medical History:  Diagnosis Date  . Anemia   . Cancer ([place])    skin cancer removed from top of head.per pt  . Carotid artery disease ([place])   . CHF (congestive heart failure), NYHA class II ([facility])    has sudden onset decom CHF? 2/2 to PNA  . Diverticulosis    chronic issues with consitpation and diarrhea  . Heart murmur   . Hypercholesterolemia   . Hypertension   . Hypertensive heart disease without CHF   . Hypothyroidism   . Irregular heart beat   . Lumbar disc disease   . Lumbar disc disease   . Mitral regurgitation   . Osteoarthritis    chronic-on multiple pain meds.  . Paroxysmal atrial fibrillation ([facility])    not on coumadin-Cards=[place]-saw him ?1 yr ago  . Pneumonia    few times  . Urinary tract infection    hx of UTI    MEDICATIONS: Current Outpatient Prescriptions on File Prior to Visit  Medication Sig Dispense Refill  . atorvastatin (LIPITOR) 40 MG tablet Take 40 mg by mouth daily at 6 PM.    . cholecalciferol (VITAMIN D) 1000 UNITS tablet Take 1,000 Units by mouth daily.    . diclofenac sodium (VOLTAREN) 1 % GEL     . furosemide (LASIX) 20 MG tablet Take 20 mg by mouth as needed. Reported on 06/04/2015    . gabapentin (NEURONTIN) 100 MG  capsule Take 4 capsules at night 120 capsule 6  . HYDROcodone-acetaminophen (NORCO/VICODIN) 5-325 MG tablet Takes 2 tablets every 6 hours  0  . levothyroxine (SYNTHROID, LEVOTHROID) 75 MCG tablet Take 75 mcg by mouth daily.     . memantine (NAMENDA) 10 MG tablet One tablet daily 30 tablet 5  . metoprolol tartrate (LOPRESSOR) 25 MG tablet Take 25 mg by mouth 2 (two) times daily.     Marland Kitchen PAZEO 0.7 % SOLN     . Polyethyl Glycol-Propyl Glycol (SYSTANE OP) Apply 2 drops to eye 4 (four) times daily as needed. For dry eyes.    . polyethylene glycol (MIRALAX / GLYCOLAX) packet Take 17 g by mouth every other day.     . TOVIAZ 8 MG TB24 tablet 8 mg. Take 1 tablet daily  1  . warfarin (COUMADIN) 2.5 MG tablet Take 2.5 mg by mouth daily at 6 PM.     No current facility-administered medications on file prior to visit.     ALLERGIES: Allergies  Allergen Reactions  .  Celebrex [Celecoxib] Other (See Comments)    Reaction=anemia/leukopenia  . Diovan [Valsartan] Other (See Comments)    Reaction=increase potassium/acute renal failure    FAMILY HISTORY: Family History  Problem Relation Age of Onset  . Cancer Mother   . Stroke Father   . Cancer Father   . Diabetes Sister   . Heart disease Sister     Heart Disease before age 2  . Heart disease Sister   . Cancer Brother     SOCIAL HISTORY: Social History   Social History  . Marital status: Widowed    Spouse name: N/A  . Number of children: 2  . Years of education: N/A   Occupational History  . Nurse, learning disability [employer]   Social History Main Topics  . Smoking status: Never Smoker  . Smokeless tobacco: Never Used  . Alcohol use No  . Drug use: No  . Sexual activity: Yes    Birth control/ protection: Post-menopausal   Other Topics Concern  . Not on file   Social History Narrative   Lives alone   Works full time   Still drives and very active usually    REVIEW OF SYSTEMS: Constitutional: No fevers, chills, or  sweats, no generalized fatigue, change in appetite Eyes: No visual changes, double vision, eye pain Ear, nose and throat: No hearing loss, ear pain, nasal congestion, sore throat Cardiovascular: No chest pain, palpitations Respiratory:  No shortness of breath at rest or with exertion, wheezes GastrointestinaI: No nausea, vomiting, diarrhea, abdominal pain, fecal incontinence Genitourinary:  No dysuria, urinary retention or frequency Musculoskeletal:  + neck pain, back pain Integumentary: No rash, pruritus, skin lesions Neurological: as above Psychiatric: No depression, insomnia, anxiety Endocrine: No palpitations, fatigue, diaphoresis, mood swings, change in appetite, change in weight, increased thirst Hematologic/Lymphatic:  No anemia, purpura, petechiae. Allergic/Immunologic: no itchy/runny eyes, nasal congestion, recent allergic reactions, rashes  PHYSICAL EXAM: Vitals:   04/27/16 1553  BP: 130/68  Pulse: 74  Resp: 16  Temp: 98.1 F (36.7 C)   General: No acute distress Head:  Normocephalic/atraumatic Neck: supple, no paraspinal tenderness, full range of motion Heart:  Regular rate and rhythm Lungs:  Clear to auscultation bilaterally Back: No paraspinal tenderness Skin/Extremities: No rash, no edema Neurological Exam: alert and oriented to person, place, and time. No aphasia or dysarthria. Fund of knowledge is appropriate.  Recent and remote memory are intact. 0/3 delayed recall. Attention and concentration are normal.  Able to name objects and repeat phrases. Cranial nerves: Pupils equal, round, reactive to light. Extraocular movements intact with no nystagmus. Visual fields full. Facial sensation intact. No facial asymmetry. Tongue, uvula, palate midline.  Motor: Bulk and tone normal, muscle strength 5/5 throughout with no pronator drift.  Sensation decreased to pin in a stocking distribution to ankles bilaterally, decreased vibration to knees bilaterally. No extinction to double  simultaneous stimulation.  Deep tendon reflexes brisk +2 throughout, toes downgoing.  Finger to nose testing intact.  Gait slow and cautious with walker, favors left knee (similar to prior). Negative Romberg test.  IMPRESSION: This is an 83 yo RH woman with a history of atrial fibrillation, mitral valve repair on chronic anticoagulation, hypertension, hyperlipidemia, peripheral vascular disease, chronic back pain, who initially presented for memory loss, Neuropsychological evaluation indicated Major Vascular Neurocognitive Disorder (vascular dementia). She had side effects on Exelon and is now on Namenda 10mg  BID with no side effects. Her daughter continues to report progressive decline in memory, as well as  more mood changes. The patient became more emotional in the office today as we discussed higher level of care either at home or looking into a Memory Care facility. She may benefit from starting low dose Lexapro 5mg  daily, side effects were discussed. I again recommended 24/7 supervision, we discussed home and fall safety. She will follow-up in 6 months and knows to call our office for any changes.   Thank you for allowing me to participate in her care.  Please do not hesitate to call for any questions or concerns.  The duration of this appointment visit was 25 minutes of face-to-face time with the patient.  Greater than 50% of this time was spent in counseling, explanation of diagnosis, planning of further management, and coordination of care.   Desiree [name], M.D.   CC: Dr. Orland Mustard

## 2016-04-27 NOTE — Patient Instructions (Addendum)
1. Start Lexapro 5mg  at bedtime 2. Continue all your medications 3. Recommend 24/7 supervision 4. Continue to monitor mood 5. Follow-up in 6 months, call for any changes

## 2016-04-30 DIAGNOSIS — G8929 Other chronic pain: Secondary | ICD-10-CM | POA: Diagnosis not present

## 2016-04-30 DIAGNOSIS — M170 Bilateral primary osteoarthritis of knee: Secondary | ICD-10-CM | POA: Diagnosis not present

## 2016-04-30 DIAGNOSIS — N3941 Urge incontinence: Secondary | ICD-10-CM | POA: Diagnosis not present

## 2016-04-30 DIAGNOSIS — M25561 Pain in right knee: Secondary | ICD-10-CM | POA: Diagnosis not present

## 2016-05-05 ENCOUNTER — Encounter: Payer: Self-pay | Admitting: Neurology

## 2016-05-05 DIAGNOSIS — M4123 Other idiopathic scoliosis, cervicothoracic region: Secondary | ICD-10-CM | POA: Diagnosis not present

## 2016-05-05 DIAGNOSIS — M170 Bilateral primary osteoarthritis of knee: Secondary | ICD-10-CM | POA: Diagnosis not present

## 2016-05-05 DIAGNOSIS — R296 Repeated falls: Secondary | ICD-10-CM | POA: Diagnosis not present

## 2016-05-05 DIAGNOSIS — Z9181 History of falling: Secondary | ICD-10-CM | POA: Diagnosis not present

## 2016-05-05 NOTE — Progress Notes (Signed)
Faxed note to PCP - Dr. Orland Mustard.

## 2016-05-07 DIAGNOSIS — M170 Bilateral primary osteoarthritis of knee: Secondary | ICD-10-CM | POA: Diagnosis not present

## 2016-05-07 DIAGNOSIS — R296 Repeated falls: Secondary | ICD-10-CM | POA: Diagnosis not present

## 2016-05-07 DIAGNOSIS — M4123 Other idiopathic scoliosis, cervicothoracic region: Secondary | ICD-10-CM | POA: Diagnosis not present

## 2016-05-07 DIAGNOSIS — Z9181 History of falling: Secondary | ICD-10-CM | POA: Diagnosis not present

## 2016-05-08 DIAGNOSIS — I739 Peripheral vascular disease, unspecified: Secondary | ICD-10-CM | POA: Diagnosis not present

## 2016-05-08 DIAGNOSIS — I480 Paroxysmal atrial fibrillation: Secondary | ICD-10-CM | POA: Diagnosis not present

## 2016-05-08 DIAGNOSIS — G44309 Post-traumatic headache, unspecified, not intractable: Secondary | ICD-10-CM | POA: Diagnosis not present

## 2016-05-08 DIAGNOSIS — E784 Other hyperlipidemia: Secondary | ICD-10-CM | POA: Diagnosis not present

## 2016-05-08 DIAGNOSIS — I511 Rupture of chordae tendineae, not elsewhere classified: Secondary | ICD-10-CM | POA: Diagnosis not present

## 2016-05-08 DIAGNOSIS — I6529 Occlusion and stenosis of unspecified carotid artery: Secondary | ICD-10-CM | POA: Diagnosis not present

## 2016-05-08 DIAGNOSIS — Z7901 Long term (current) use of anticoagulants: Secondary | ICD-10-CM | POA: Diagnosis not present

## 2016-05-08 DIAGNOSIS — Z954 Presence of other heart-valve replacement: Secondary | ICD-10-CM | POA: Diagnosis not present

## 2016-05-08 DIAGNOSIS — I119 Hypertensive heart disease without heart failure: Secondary | ICD-10-CM | POA: Diagnosis not present

## 2016-05-19 DIAGNOSIS — R296 Repeated falls: Secondary | ICD-10-CM | POA: Diagnosis not present

## 2016-05-19 DIAGNOSIS — Z9181 History of falling: Secondary | ICD-10-CM | POA: Diagnosis not present

## 2016-05-19 DIAGNOSIS — M4123 Other idiopathic scoliosis, cervicothoracic region: Secondary | ICD-10-CM | POA: Diagnosis not present

## 2016-05-19 DIAGNOSIS — M170 Bilateral primary osteoarthritis of knee: Secondary | ICD-10-CM | POA: Diagnosis not present

## 2016-05-21 DIAGNOSIS — M47816 Spondylosis without myelopathy or radiculopathy, lumbar region: Secondary | ICD-10-CM | POA: Diagnosis not present

## 2016-05-21 DIAGNOSIS — G912 (Idiopathic) normal pressure hydrocephalus: Secondary | ICD-10-CM | POA: Diagnosis not present

## 2016-05-22 DIAGNOSIS — M170 Bilateral primary osteoarthritis of knee: Secondary | ICD-10-CM | POA: Diagnosis not present

## 2016-05-22 DIAGNOSIS — R296 Repeated falls: Secondary | ICD-10-CM | POA: Diagnosis not present

## 2016-05-22 DIAGNOSIS — M4123 Other idiopathic scoliosis, cervicothoracic region: Secondary | ICD-10-CM | POA: Diagnosis not present

## 2016-05-22 DIAGNOSIS — Z9181 History of falling: Secondary | ICD-10-CM | POA: Diagnosis not present

## 2016-05-26 ENCOUNTER — Ambulatory Visit (INDEPENDENT_AMBULATORY_CARE_PROVIDER_SITE_OTHER): Payer: PPO | Admitting: Sports Medicine

## 2016-05-26 DIAGNOSIS — L600 Ingrowing nail: Secondary | ICD-10-CM

## 2016-05-26 DIAGNOSIS — M79674 Pain in right toe(s): Secondary | ICD-10-CM

## 2016-05-26 DIAGNOSIS — R296 Repeated falls: Secondary | ICD-10-CM | POA: Diagnosis not present

## 2016-05-26 DIAGNOSIS — B351 Tinea unguium: Secondary | ICD-10-CM

## 2016-05-26 DIAGNOSIS — M170 Bilateral primary osteoarthritis of knee: Secondary | ICD-10-CM | POA: Diagnosis not present

## 2016-05-26 DIAGNOSIS — Z9181 History of falling: Secondary | ICD-10-CM | POA: Diagnosis not present

## 2016-05-26 DIAGNOSIS — M4123 Other idiopathic scoliosis, cervicothoracic region: Secondary | ICD-10-CM | POA: Diagnosis not present

## 2016-05-26 DIAGNOSIS — M79675 Pain in left toe(s): Secondary | ICD-10-CM
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
## 2016-06-02 DIAGNOSIS — Z9181 History of falling: Secondary | ICD-10-CM | POA: Diagnosis not present

## 2016-06-02 DIAGNOSIS — R296 Repeated falls: Secondary | ICD-10-CM | POA: Diagnosis not present

## 2016-06-02 DIAGNOSIS — M170 Bilateral primary osteoarthritis of knee: Secondary | ICD-10-CM | POA: Diagnosis not present

## 2016-06-02 DIAGNOSIS — M4123 Other idiopathic scoliosis, cervicothoracic region: Secondary | ICD-10-CM | POA: Diagnosis not present

## 2016-06-04 DIAGNOSIS — M170 Bilateral primary osteoarthritis of knee: Secondary | ICD-10-CM | POA: Diagnosis not present

## 2016-06-04 DIAGNOSIS — G44309 Post-traumatic headache, unspecified, not intractable: Secondary | ICD-10-CM | POA: Diagnosis not present

## 2016-06-04 DIAGNOSIS — Z7901 Long term (current) use of anticoagulants: Secondary | ICD-10-CM | POA: Diagnosis not present

## 2016-06-04 DIAGNOSIS — E039 Hypothyroidism, unspecified: Secondary | ICD-10-CM | POA: Diagnosis not present

## 2016-06-04 DIAGNOSIS — R296 Repeated falls: Secondary | ICD-10-CM | POA: Diagnosis not present

## 2016-06-04 DIAGNOSIS — I119 Hypertensive heart disease without heart failure: Secondary | ICD-10-CM | POA: Diagnosis not present

## 2016-06-04 DIAGNOSIS — E784 Other hyperlipidemia: Secondary | ICD-10-CM | POA: Diagnosis not present

## 2016-06-04 DIAGNOSIS — Z9181 History of falling: Secondary | ICD-10-CM | POA: Diagnosis not present

## 2016-06-04 DIAGNOSIS — I739 Peripheral vascular disease, unspecified: Secondary | ICD-10-CM | POA: Diagnosis not present

## 2016-06-04 DIAGNOSIS — I480 Paroxysmal atrial fibrillation: Secondary | ICD-10-CM | POA: Diagnosis not present

## 2016-06-04 DIAGNOSIS — I6529 Occlusion and stenosis of unspecified carotid artery: Secondary | ICD-10-CM | POA: Diagnosis not present

## 2016-06-04 DIAGNOSIS — I511 Rupture of chordae tendineae, not elsewhere classified: Secondary | ICD-10-CM | POA: Diagnosis not present

## 2016-06-04 DIAGNOSIS — Z954 Presence of other heart-valve replacement: Secondary | ICD-10-CM | POA: Diagnosis not present

## 2016-06-04 DIAGNOSIS — M4123 Other idiopathic scoliosis, cervicothoracic region: Secondary | ICD-10-CM | POA: Diagnosis not present

## 2016-06-05 ENCOUNTER — Ambulatory Visit: Payer: PPO | Admitting: Neurology

## 2016-06-09 DIAGNOSIS — M4123 Other idiopathic scoliosis, cervicothoracic region: Secondary | ICD-10-CM | POA: Diagnosis not present

## 2016-06-09 DIAGNOSIS — Z9181 History of falling: Secondary | ICD-10-CM | POA: Diagnosis not present

## 2016-06-09 DIAGNOSIS — R296 Repeated falls: Secondary | ICD-10-CM | POA: Diagnosis not present

## 2016-06-09 DIAGNOSIS — M170 Bilateral primary osteoarthritis of knee: Secondary | ICD-10-CM | POA: Diagnosis not present

## 2016-06-11 DIAGNOSIS — M1611 Unilateral primary osteoarthritis, right hip: Secondary | ICD-10-CM | POA: Diagnosis not present

## 2016-06-15 DIAGNOSIS — Z9181 History of falling: Secondary | ICD-10-CM | POA: Diagnosis not present

## 2016-06-15 DIAGNOSIS — M4123 Other idiopathic scoliosis, cervicothoracic region: Secondary | ICD-10-CM | POA: Diagnosis not present

## 2016-06-15 DIAGNOSIS — M170 Bilateral primary osteoarthritis of knee: Secondary | ICD-10-CM | POA: Diagnosis not present

## 2016-06-15 DIAGNOSIS — R296 Repeated falls: Secondary | ICD-10-CM | POA: Diagnosis not present

## 2016-06-17 DIAGNOSIS — E039 Hypothyroidism, unspecified: Secondary | ICD-10-CM | POA: Diagnosis not present

## 2016-06-17 DIAGNOSIS — I6529 Occlusion and stenosis of unspecified carotid artery: Secondary | ICD-10-CM | POA: Diagnosis not present

## 2016-06-17 DIAGNOSIS — I119 Hypertensive heart disease without heart failure: Secondary | ICD-10-CM | POA: Diagnosis not present

## 2016-06-17 DIAGNOSIS — G44309 Post-traumatic headache, unspecified, not intractable: Secondary | ICD-10-CM | POA: Diagnosis not present

## 2016-06-17 DIAGNOSIS — E784 Other hyperlipidemia: Secondary | ICD-10-CM | POA: Diagnosis not present

## 2016-06-17 DIAGNOSIS — I739 Peripheral vascular disease, unspecified: Secondary | ICD-10-CM | POA: Diagnosis not present

## 2016-06-17 DIAGNOSIS — Z954 Presence of other heart-valve replacement: Secondary | ICD-10-CM | POA: Diagnosis not present

## 2016-06-17 DIAGNOSIS — I480 Paroxysmal atrial fibrillation: Secondary | ICD-10-CM | POA: Diagnosis not present

## 2016-06-17 DIAGNOSIS — I511 Rupture of chordae tendineae, not elsewhere classified: Secondary | ICD-10-CM | POA: Diagnosis not present

## 2016-06-17 DIAGNOSIS — Z7901 Long term (current) use of anticoagulants: Secondary | ICD-10-CM | POA: Diagnosis not present

## 2016-06-17 DIAGNOSIS — M4123 Other idiopathic scoliosis, cervicothoracic region: Secondary | ICD-10-CM | POA: Diagnosis not present

## 2016-06-17 DIAGNOSIS — R296 Repeated falls: Secondary | ICD-10-CM | POA: Diagnosis not present

## 2016-06-17 DIAGNOSIS — M170 Bilateral primary osteoarthritis of knee: Secondary | ICD-10-CM | POA: Diagnosis not present

## 2016-06-17 DIAGNOSIS — Z9181 History of falling: Secondary | ICD-10-CM | POA: Diagnosis not present

## 2016-06-18 DIAGNOSIS — M25511 Pain in right shoulder: Secondary | ICD-10-CM | POA: Diagnosis not present

## 2016-06-18 DIAGNOSIS — G8929 Other chronic pain: Secondary | ICD-10-CM | POA: Diagnosis not present

## 2016-06-22 DIAGNOSIS — M170 Bilateral primary osteoarthritis of knee: Secondary | ICD-10-CM | POA: Diagnosis not present

## 2016-06-22 DIAGNOSIS — Z9181 History of falling: Secondary | ICD-10-CM | POA: Diagnosis not present

## 2016-06-22 DIAGNOSIS — R296 Repeated falls: Secondary | ICD-10-CM | POA: Diagnosis not present

## 2016-06-22 DIAGNOSIS — M4123 Other idiopathic scoliosis, cervicothoracic region: Secondary | ICD-10-CM | POA: Diagnosis not present

## 2016-06-24 DIAGNOSIS — M170 Bilateral primary osteoarthritis of knee: Secondary | ICD-10-CM | POA: Diagnosis not present

## 2016-06-24 DIAGNOSIS — M4123 Other idiopathic scoliosis, cervicothoracic region: Secondary | ICD-10-CM | POA: Diagnosis not present

## 2016-06-24 DIAGNOSIS — Z9181 History of falling: Secondary | ICD-10-CM | POA: Diagnosis not present

## 2016-06-24 DIAGNOSIS — R296 Repeated falls: Secondary | ICD-10-CM | POA: Diagnosis not present

## 2016-06-25 ENCOUNTER — Emergency Department (HOSPITAL_COMMUNITY): Payer: PPO

## 2016-06-25 ENCOUNTER — Encounter (HOSPITAL_COMMUNITY): Payer: Self-pay | Admitting: Emergency Medicine

## 2016-06-25 ENCOUNTER — Inpatient Hospital Stay (HOSPITAL_COMMUNITY)
Admission: EM | Admit: 2016-06-25 | Discharge: 2016-06-29 | DRG: 480 | Disposition: A | Discharge status: Skilled Nursing Facility | Payer: PPO | Attending: Internal Medicine | Admitting: Internal Medicine

## 2016-06-25 DIAGNOSIS — S199XXA Unspecified injury of neck, initial encounter: Secondary | ICD-10-CM | POA: Diagnosis not present

## 2016-06-25 DIAGNOSIS — E039 Hypothyroidism, unspecified: Secondary | ICD-10-CM | POA: Diagnosis not present

## 2016-06-25 DIAGNOSIS — Z419 Encounter for procedure for purposes other than remedying health state, unspecified: Secondary | ICD-10-CM

## 2016-06-25 DIAGNOSIS — Z01810 Encounter for preprocedural cardiovascular examination: Secondary | ICD-10-CM | POA: Diagnosis not present

## 2016-06-25 DIAGNOSIS — R5381 Other malaise: Secondary | ICD-10-CM

## 2016-06-25 DIAGNOSIS — S0990XA Unspecified injury of head, initial encounter: Secondary | ICD-10-CM | POA: Diagnosis not present

## 2016-06-25 DIAGNOSIS — E781 Pure hyperglyceridemia: Secondary | ICD-10-CM | POA: Diagnosis not present

## 2016-06-25 DIAGNOSIS — M1611 Unilateral primary osteoarthritis, right hip: Secondary | ICD-10-CM | POA: Diagnosis present

## 2016-06-25 DIAGNOSIS — S92153A Displaced avulsion fracture (chip fracture) of unspecified talus, initial encounter for closed fracture: Secondary | ICD-10-CM | POA: Diagnosis not present

## 2016-06-25 DIAGNOSIS — Z952 Presence of prosthetic heart valve: Secondary | ICD-10-CM

## 2016-06-25 DIAGNOSIS — D649 Anemia, unspecified: Secondary | ICD-10-CM | POA: Diagnosis not present

## 2016-06-25 DIAGNOSIS — D638 Anemia in other chronic diseases classified elsewhere: Secondary | ICD-10-CM | POA: Diagnosis present

## 2016-06-25 DIAGNOSIS — Z888 Allergy status to other drugs, medicaments and biological substances status: Secondary | ICD-10-CM

## 2016-06-25 DIAGNOSIS — Z9181 History of falling: Secondary | ICD-10-CM | POA: Diagnosis not present

## 2016-06-25 DIAGNOSIS — S79911A Unspecified injury of right hip, initial encounter: Secondary | ICD-10-CM | POA: Diagnosis not present

## 2016-06-25 DIAGNOSIS — F0150 Vascular dementia without behavioral disturbance: Secondary | ICD-10-CM | POA: Diagnosis not present

## 2016-06-25 DIAGNOSIS — W19XXXA Unspecified fall, initial encounter: Secondary | ICD-10-CM

## 2016-06-25 DIAGNOSIS — Z90710 Acquired absence of both cervix and uterus: Secondary | ICD-10-CM

## 2016-06-25 DIAGNOSIS — Z886 Allergy status to analgesic agent status: Secondary | ICD-10-CM

## 2016-06-25 DIAGNOSIS — R011 Cardiac murmur, unspecified: Secondary | ICD-10-CM | POA: Diagnosis not present

## 2016-06-25 DIAGNOSIS — E7800 Pure hypercholesterolemia, unspecified: Secondary | ICD-10-CM | POA: Diagnosis not present

## 2016-06-25 DIAGNOSIS — I2699 Other pulmonary embolism without acute cor pulmonale: Secondary | ICD-10-CM | POA: Diagnosis present

## 2016-06-25 DIAGNOSIS — E43 Unspecified severe protein-calorie malnutrition: Secondary | ICD-10-CM | POA: Diagnosis not present

## 2016-06-25 DIAGNOSIS — S72011A Unspecified intracapsular fracture of right femur, initial encounter for closed fracture: Secondary | ICD-10-CM | POA: Diagnosis not present

## 2016-06-25 DIAGNOSIS — D62 Acute posthemorrhagic anemia: Secondary | ICD-10-CM | POA: Diagnosis not present

## 2016-06-25 DIAGNOSIS — Z66 Do not resuscitate: Secondary | ICD-10-CM | POA: Diagnosis not present

## 2016-06-25 DIAGNOSIS — I10 Essential (primary) hypertension: Secondary | ICD-10-CM | POA: Diagnosis not present

## 2016-06-25 DIAGNOSIS — Z9842 Cataract extraction status, left eye: Secondary | ICD-10-CM

## 2016-06-25 DIAGNOSIS — I4891 Unspecified atrial fibrillation: Secondary | ICD-10-CM | POA: Diagnosis not present

## 2016-06-25 DIAGNOSIS — Z79891 Long term (current) use of opiate analgesic: Secondary | ICD-10-CM

## 2016-06-25 DIAGNOSIS — Z7901 Long term (current) use of anticoagulants: Secondary | ICD-10-CM

## 2016-06-25 DIAGNOSIS — M6281 Muscle weakness (generalized): Secondary | ICD-10-CM | POA: Diagnosis not present

## 2016-06-25 DIAGNOSIS — I739 Peripheral vascular disease, unspecified: Secondary | ICD-10-CM | POA: Diagnosis not present

## 2016-06-25 DIAGNOSIS — I480 Paroxysmal atrial fibrillation: Secondary | ICD-10-CM | POA: Diagnosis not present

## 2016-06-25 DIAGNOSIS — Z79899 Other long term (current) drug therapy: Secondary | ICD-10-CM

## 2016-06-25 DIAGNOSIS — M519 Unspecified thoracic, thoracolumbar and lumbosacral intervertebral disc disorder: Secondary | ICD-10-CM | POA: Diagnosis not present

## 2016-06-25 DIAGNOSIS — Z86711 Personal history of pulmonary embolism: Secondary | ICD-10-CM

## 2016-06-25 DIAGNOSIS — I119 Hypertensive heart disease without heart failure: Secondary | ICD-10-CM | POA: Diagnosis not present

## 2016-06-25 DIAGNOSIS — M545 Low back pain: Secondary | ICD-10-CM | POA: Diagnosis present

## 2016-06-25 DIAGNOSIS — G4700 Insomnia, unspecified: Secondary | ICD-10-CM | POA: Diagnosis not present

## 2016-06-25 DIAGNOSIS — R278 Other lack of coordination: Secondary | ICD-10-CM | POA: Diagnosis not present

## 2016-06-25 DIAGNOSIS — Z6820 Body mass index (BMI) 20.0-20.9, adult: Secondary | ICD-10-CM

## 2016-06-25 DIAGNOSIS — M5186 Other intervertebral disc disorders, lumbar region: Secondary | ICD-10-CM | POA: Diagnosis not present

## 2016-06-25 DIAGNOSIS — S72044A Nondisplaced fracture of base of neck of right femur, initial encounter for closed fracture: Secondary | ICD-10-CM | POA: Diagnosis not present

## 2016-06-25 DIAGNOSIS — Z833 Family history of diabetes mellitus: Secondary | ICD-10-CM

## 2016-06-25 DIAGNOSIS — Z85828 Personal history of other malignant neoplasm of skin: Secondary | ICD-10-CM

## 2016-06-25 DIAGNOSIS — I110 Hypertensive heart disease with heart failure: Secondary | ICD-10-CM | POA: Diagnosis not present

## 2016-06-25 DIAGNOSIS — M5136 Other intervertebral disc degeneration, lumbar region: Secondary | ICD-10-CM | POA: Diagnosis not present

## 2016-06-25 DIAGNOSIS — K5900 Constipation, unspecified: Secondary | ICD-10-CM | POA: Diagnosis not present

## 2016-06-25 DIAGNOSIS — Y92007 Garden or yard of unspecified non-institutional (private) residence as the place of occurrence of the external cause: Secondary | ICD-10-CM | POA: Diagnosis not present

## 2016-06-25 DIAGNOSIS — Z9841 Cataract extraction status, right eye: Secondary | ICD-10-CM

## 2016-06-25 DIAGNOSIS — I5032 Chronic diastolic (congestive) heart failure: Secondary | ICD-10-CM | POA: Diagnosis present

## 2016-06-25 DIAGNOSIS — W1830XA Fall on same level, unspecified, initial encounter: Secondary | ICD-10-CM | POA: Diagnosis not present

## 2016-06-25 DIAGNOSIS — R008 Other abnormalities of heart beat: Secondary | ICD-10-CM | POA: Diagnosis present

## 2016-06-25 DIAGNOSIS — S72001A Fracture of unspecified part of neck of right femur, initial encounter for closed fracture: Secondary | ICD-10-CM | POA: Diagnosis present

## 2016-06-25 DIAGNOSIS — Z823 Family history of stroke: Secondary | ICD-10-CM

## 2016-06-25 DIAGNOSIS — T148XXA Other injury of unspecified body region, initial encounter: Secondary | ICD-10-CM | POA: Diagnosis not present

## 2016-06-25 DIAGNOSIS — S72001D Fracture of unspecified part of neck of right femur, subsequent encounter for closed fracture with routine healing: Secondary | ICD-10-CM | POA: Diagnosis not present

## 2016-06-25 DIAGNOSIS — Z9889 Other specified postprocedural states: Secondary | ICD-10-CM | POA: Diagnosis not present

## 2016-06-25 DIAGNOSIS — G609 Hereditary and idiopathic neuropathy, unspecified: Secondary | ICD-10-CM | POA: Diagnosis present

## 2016-06-25 DIAGNOSIS — I493 Ventricular premature depolarization: Secondary | ICD-10-CM | POA: Diagnosis present

## 2016-06-25 DIAGNOSIS — E559 Vitamin D deficiency, unspecified: Secondary | ICD-10-CM | POA: Diagnosis not present

## 2016-06-25 DIAGNOSIS — S299XXA Unspecified injury of thorax, initial encounter: Secondary | ICD-10-CM | POA: Diagnosis not present

## 2016-06-25 DIAGNOSIS — R2681 Unsteadiness on feet: Secondary | ICD-10-CM | POA: Diagnosis not present

## 2016-06-25 DIAGNOSIS — R413 Other amnesia: Secondary | ICD-10-CM | POA: Diagnosis not present

## 2016-06-25 DIAGNOSIS — M1990 Unspecified osteoarthritis, unspecified site: Secondary | ICD-10-CM | POA: Diagnosis present

## 2016-06-25 DIAGNOSIS — Z8249 Family history of ischemic heart disease and other diseases of the circulatory system: Secondary | ICD-10-CM

## 2016-06-25 DIAGNOSIS — Z4789 Encounter for other orthopedic aftercare: Secondary | ICD-10-CM | POA: Diagnosis not present

## 2016-06-25 DIAGNOSIS — G8929 Other chronic pain: Secondary | ICD-10-CM | POA: Diagnosis not present

## 2016-06-25 DIAGNOSIS — Z8701 Personal history of pneumonia (recurrent): Secondary | ICD-10-CM

## 2016-06-25 DIAGNOSIS — E785 Hyperlipidemia, unspecified: Secondary | ICD-10-CM | POA: Diagnosis present

## 2016-06-25 DIAGNOSIS — R262 Difficulty in walking, not elsewhere classified: Secondary | ICD-10-CM | POA: Diagnosis not present

## 2016-06-25 DIAGNOSIS — R52 Pain, unspecified: Secondary | ICD-10-CM | POA: Diagnosis not present

## 2016-06-25 DIAGNOSIS — Z87440 Personal history of urinary (tract) infections: Secondary | ICD-10-CM

## 2016-06-25 DIAGNOSIS — Z79890 Hormone replacement therapy (postmenopausal): Secondary | ICD-10-CM

## 2016-06-25 DIAGNOSIS — R42 Dizziness and giddiness: Secondary | ICD-10-CM | POA: Diagnosis not present

## 2016-06-25 DIAGNOSIS — R079 Chest pain, unspecified: Secondary | ICD-10-CM | POA: Diagnosis not present

## 2016-06-25 DIAGNOSIS — M542 Cervicalgia: Secondary | ICD-10-CM | POA: Diagnosis not present

## 2016-06-25 LAB — CBC WITH DIFFERENTIAL/PLATELET
BASOS ABS: 0 10*3/uL (ref 0.0–0.1)
Basophils Relative: 0 %
Eosinophils Absolute: 0.1 10*3/uL (ref 0.0–0.7)
Eosinophils Relative: 1 %
HEMATOCRIT: 33.7 % — AB (ref 36.0–46.0)
Hemoglobin: 10.9 g/dL — ABNORMAL LOW (ref 12.0–15.0)
Lymphocytes Relative: 8 %
Lymphs Abs: 0.8 10*3/uL (ref 0.7–4.0)
MCH: 30.6 pg (ref 26.0–34.0)
MCHC: 32.3 g/dL (ref 30.0–36.0)
MCV: 94.7 fL (ref 78.0–100.0)
MONO ABS: 0.5 10*3/uL (ref 0.1–1.0)
Monocytes Relative: 4 %
NEUTROS ABS: 8.8 10*3/uL — AB (ref 1.7–7.7)
Neutrophils Relative %: 87 %
Platelets: 142 10*3/uL — ABNORMAL LOW (ref 150–400)
RBC: 3.56 MIL/uL — ABNORMAL LOW (ref 3.87–5.11)
RDW: 14.2 % (ref 11.5–15.5)
WBC: 10.2 10*3/uL (ref 4.0–10.5)

## 2016-06-25 LAB — APTT: APTT: 37 s — AB (ref 24–36)

## 2016-06-25 LAB — PROTIME-INR
INR: 2
Prothrombin Time: 23 seconds — ABNORMAL HIGH (ref 11.4–15.2)

## 2016-06-25 LAB — I-STAT CHEM 8, ED
BUN: 26 mg/dL — AB (ref 6–20)
CALCIUM ION: 1.18 mmol/L (ref 1.15–1.40)
CREATININE: 0.7 mg/dL (ref 0.44–1.00)
Chloride: 102 mmol/L (ref 101–111)
GLUCOSE: 110 mg/dL — AB (ref 65–99)
HCT: 32 % — ABNORMAL LOW (ref 36.0–46.0)
HEMOGLOBIN: 10.9 g/dL — AB (ref 12.0–15.0)
Potassium: 3.8 mmol/L (ref 3.5–5.1)
Sodium: 139 mmol/L (ref 135–145)
TCO2: 26 mmol/L (ref 0–100)

## 2016-06-25 LAB — BRAIN NATRIURETIC PEPTIDE: B NATRIURETIC PEPTIDE 5: 166 pg/mL — AB (ref 0.0–100.0)

## 2016-06-25 LAB — TYPE AND SCREEN
ABO/RH(D): O POS
Antibody Screen: NEGATIVE

## 2016-06-25 LAB — BASIC METABOLIC PANEL
ANION GAP: 8 (ref 5–15)
BUN: 26 mg/dL — ABNORMAL HIGH (ref 6–20)
CHLORIDE: 104 mmol/L (ref 101–111)
CO2: 25 mmol/L (ref 22–32)
Calcium: 9.2 mg/dL (ref 8.9–10.3)
Creatinine, Ser: 0.74 mg/dL (ref 0.44–1.00)
GFR calc Af Amer: >60 mL/min (ref >60)
GFR calc non Af Amer: >60 mL/min (ref >60)
Glucose, Bld: 115 mg/dL — ABNORMAL HIGH (ref 65–99)
POTASSIUM: 3.8 mmol/L (ref 3.5–5.1)
Sodium: 137 mmol/L (ref 135–145)

## 2016-06-25 LAB — CK: CK TOTAL: 63 U/L (ref 38–234)

## 2016-06-25 LAB — I-STAT TROPONIN, ED: TROPONIN I, POC: 0.01 ng/mL (ref 0.00–0.08)

## 2016-06-25 MED ORDER — FESOTERODINE FUMARATE ER 8 MG PO TB24
8.0000 mg | ORAL_TABLET | Freq: Every day | ORAL | Status: DC
  Administered 2016-06-26 – 2016-06-29 (×5): 8 mg via ORAL
  Filled 2016-06-25 (×5): qty 1

## 2016-06-25 MED ORDER — MEMANTINE HCL 10 MG PO TABS: 10.0000 mg | ORAL_TABLET | Freq: Every day | ORAL | Status: DC

## 2016-06-25 MED ORDER — MORPHINE SULFATE (PF) 4 MG/ML IV SOLN
1.0000 mg | INTRAVENOUS | Status: DC | PRN
  Administered 2016-06-25 – 2016-06-28 (×5): 1 mg via INTRAVENOUS
  Filled 2016-06-25 (×5): qty 1

## 2016-06-25 MED ORDER — ONDANSETRON HCL 4 MG/2ML IJ SOLN
4.0000 mg | Freq: Three times a day (TID) | INTRAMUSCULAR | Status: DC | PRN
  Administered 2016-06-26: 4 mg via INTRAVENOUS

## 2016-06-25 MED ORDER — ATORVASTATIN CALCIUM 40 MG PO TABS: 40.0000 mg | ORAL_TABLET | Freq: Every day | ORAL | Status: DC

## 2016-06-25 MED ORDER — ZOLPIDEM TARTRATE 5 MG PO TABS: 5.0000 mg | ORAL_TABLET | Freq: Every evening | ORAL | Status: DC | PRN

## 2016-06-25 MED ORDER — VITAMIN D3 25 MCG (1000 UNIT) PO TABS
1000.0000 [IU] | ORAL_TABLET | Freq: Every day | ORAL | Status: DC
  Administered 2016-06-26 – 2016-06-29 (×4): 1000 [IU] via ORAL
  Filled 2016-06-25 (×4): qty 1

## 2016-06-25 MED ORDER — LEVOTHYROXINE SODIUM 50 MCG PO TABS
75.0000 ug | ORAL_TABLET | Freq: Every day | ORAL | Status: DC
  Administered 2016-06-26 – 2016-06-29 (×4): 75 ug via ORAL
  Filled 2016-06-25 (×2): qty 1
  Filled 2016-06-25: qty 3
  Filled 2016-06-25 (×3): qty 1

## 2016-06-25 MED ORDER — GABAPENTIN 400 MG PO CAPS
400.0000 mg | ORAL_CAPSULE | Freq: Every day | ORAL | Status: DC
  Administered 2016-06-26 – 2016-06-28 (×4): 400 mg via ORAL
  Filled 2016-06-25 (×4): qty 1

## 2016-06-25 MED ORDER — HYDROCODONE-ACETAMINOPHEN 5-325 MG PO TABS
1.0000 | ORAL_TABLET | Freq: Four times a day (QID) | ORAL | Status: DC | PRN
  Administered 2016-06-26 (×2): 1 via ORAL
  Filled 2016-06-25 (×2): qty 1

## 2016-06-25 MED ORDER — METOPROLOL TARTRATE 25 MG PO TABS
25.0000 mg | ORAL_TABLET | Freq: Two times a day (BID) | ORAL | Status: DC
  Filled 2016-06-25 (×2): qty 1

## 2016-06-25 MED ORDER — POLYETHYLENE GLYCOL 3350 17 G PO PACK
17.0000 g | PACK | Freq: Every day | ORAL | Status: DC | PRN
  Administered 2016-06-28: 17 g via ORAL
  Filled 2016-06-25: qty 1

## 2016-06-25 MED ORDER — ACETAMINOPHEN 325 MG PO TABS: 650.0000 mg | ORAL_TABLET | Freq: Four times a day (QID) | ORAL | Status: DC | PRN

## 2016-06-25 MED ORDER — SODIUM CHLORIDE 0.9 % IV BOLUS (SEPSIS)
1000.0000 mL | Freq: Once | INTRAVENOUS | Status: AC
  Administered 2016-06-25: 1000 mL via INTRAVENOUS

## 2016-06-25 MED ORDER — DICLOFENAC SODIUM 1 % TD GEL: 1.0000 g | Freq: Three times a day (TID) | TRANSDERMAL | Status: DC | PRN

## 2016-06-25 MED ORDER — SENNOSIDES-DOCUSATE SODIUM 8.6-50 MG PO TABS: 1.0000 | ORAL_TABLET | Freq: Every evening | ORAL | Status: DC | PRN

## 2016-06-25 MED ORDER — ESCITALOPRAM OXALATE 10 MG PO TABS: 5.0000 mg | ORAL_TABLET | Freq: Every day | ORAL | Status: DC

## 2016-06-25 MED ORDER — METHOCARBAMOL 500 MG PO TABS
500.0000 mg | ORAL_TABLET | Freq: Three times a day (TID) | ORAL | Status: DC | PRN
  Administered 2016-06-26 – 2016-06-27 (×2): 500 mg via ORAL
  Filled 2016-06-25 (×2): qty 1

## 2016-06-25 MED ORDER — RIVASTIGMINE 4.6 MG/24HR TD PT24: 4.6000 mg | MEDICATED_PATCH | Freq: Every day | TRANSDERMAL | Status: DC

## 2016-06-25 MED ORDER — HEPARIN (PORCINE) IN NACL 100-0.45 UNIT/ML-% IJ SOLN
750.0000 [IU]/h | INTRAMUSCULAR | Status: DC
  Administered 2016-06-26: 750 [IU]/h via INTRAVENOUS
  Filled 2016-06-25: qty 250

## 2016-06-25 NOTE — ED Notes (Signed)
ED Provider at bedside. 
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
## 2016-06-25 NOTE — ED Notes (Signed)
Patient transported to CT 

## 2016-06-25 NOTE — ED Notes (Signed)
Attempted report, [facility] states pt bed has not been assigned and to call back

## 2016-06-25 NOTE — ED Triage Notes (Signed)
Pt arrives EMS from home where she fell inher backyard and layed there about 2 hours un til found by friends.c/o neck , back and all over pain.bygeminy noted by EMS c/o dizziness. Unknown if syncope from dizzy. Arrives weith towelk roll in place.

## 2016-06-25 NOTE — Progress Notes (Signed)
ANTICOAGULATION CONSULT NOTE - Initial Consult  Pharmacy Consult for heparin Indication: atrial fibrillation and pulmonary embolus  Allergies  Allergen Reactions  . Celebrex [Celecoxib] Other (See Comments)    Reaction=anemia/leukopenia  . Diovan [Valsartan] Other (See Comments)    Reaction=increase potassium/acute renal failure    Patient Measurements:   Heparin Dosing Weight: 52.7kg  Vital Signs: BP: 120/51 (05/03 2245) Pulse Rate: 87 (05/03 2245)  Labs:  Recent Labs  06/25/16 1829 06/25/16 1842 06/25/16 2115  HGB 10.9* 10.9*  --   HCT 33.7* 32.0*  --   PLT 142*  --   --   APTT  --   --  37*  LABPROT  --   --  23.0*  INR  --   --  2.00  CREATININE 0.74 0.70  --   CKTOTAL  --   --  63    CrCl cannot be calculated (Unknown ideal weight.).   Medical History: Past Medical History:  Diagnosis Date  . Anemia   . Cancer ([place])    skin cancer removed from top of head.per pt  . Carotid artery disease ([place])   . CHF (congestive heart failure), NYHA class II (HCC)    has sudden onset decom CHF? 2/2 to PNA  . Diverticulosis    chronic issues with consitpation and diarrhea  . Heart murmur   . Hypercholesterolemia   . Hypertension   . Hypertensive heart disease without CHF   . Hypothyroidism   . Irregular heart beat   . Lumbar disc disease   . Lumbar disc disease   . Mitral regurgitation   . Osteoarthritis    chronic-on multiple pain meds.  . Paroxysmal atrial fibrillation (HCC)    not on coumadin-Cards=[facility]-saw him ?1 yr ago  . Pneumonia    few times  . Urinary tract infection    hx of UTI    Medications:  Infusions:  . heparin      Assessment: 49 yof presented to the ED after a fall. Planning surgery tomorrow. Pt is on chronic coumadin for history of afib and PE. INR is 2 so will go ahead and start IV heparin conservatively while awaiting surgery. H/H and platelets are slightly low but no bleeding noted.   Goal of Therapy:  Heparin level 0.3-0.7  units/ml Monitor platelets by anticoagulation protocol: Yes   Plan:  Heparin gtt 750 units/hr - no bolus Check an 8 hr heparin level Daily heparin level and CBC F/u surgery and restart warfarin  Draken Farrior, Rande Lawman 06/25/2016,10:51 PM

## 2016-06-26 ENCOUNTER — Inpatient Hospital Stay (HOSPITAL_COMMUNITY): Payer: PPO | Admitting: Certified Registered Nurse Anesthetist

## 2016-06-26 ENCOUNTER — Encounter (HOSPITAL_COMMUNITY): Payer: Self-pay | Admitting: *Deleted

## 2016-06-26 ENCOUNTER — Inpatient Hospital Stay (HOSPITAL_COMMUNITY): Payer: PPO

## 2016-06-26 ENCOUNTER — Encounter (HOSPITAL_COMMUNITY)
Admission: EM | Disposition: A | Discharge status: Skilled Nursing Facility | Payer: Self-pay | Source: Home / Self Care | Attending: Internal Medicine

## 2016-06-26 DIAGNOSIS — S72001D Fracture of unspecified part of neck of right femur, subsequent encounter for closed fracture with routine healing: Secondary | ICD-10-CM

## 2016-06-26 DIAGNOSIS — I2699 Other pulmonary embolism without acute cor pulmonale: Secondary | ICD-10-CM

## 2016-06-26 DIAGNOSIS — E785 Hyperlipidemia, unspecified: Secondary | ICD-10-CM

## 2016-06-26 DIAGNOSIS — Z01810 Encounter for preprocedural cardiovascular examination: Secondary | ICD-10-CM

## 2016-06-26 DIAGNOSIS — E43 Unspecified severe protein-calorie malnutrition: Secondary | ICD-10-CM

## 2016-06-26 HISTORY — PX: HIP PINNING,CANNULATED: SHX1758

## 2016-06-26 HISTORY — PX: HIP SURGERY: SHX245

## 2016-06-26 LAB — TSH: TSH: 1.183 u[IU]/mL (ref 0.350–4.500)

## 2016-06-26 LAB — CBC
HEMATOCRIT: 34 % — AB (ref 36.0–46.0)
HEMOGLOBIN: 10.3 g/dL — AB (ref 12.0–15.0)
MCH: 29.7 pg (ref 26.0–34.0)
MCHC: 30.3 g/dL (ref 30.0–36.0)
MCV: 98 fL (ref 78.0–100.0)
Platelets: 122 10*3/uL — ABNORMAL LOW (ref 150–400)
RBC: 3.47 MIL/uL — AB (ref 3.87–5.11)
RDW: 14.1 % (ref 11.5–15.5)
WBC: 7 10*3/uL (ref 4.0–10.5)

## 2016-06-26 LAB — TYPE AND SCREEN
ABO/RH(D): O POS
ANTIBODY SCREEN: NEGATIVE

## 2016-06-26 LAB — HEPARIN LEVEL (UNFRACTIONATED): Heparin Unfractionated: 0.24 IU/mL — ABNORMAL LOW (ref 0.30–0.70)

## 2016-06-26 LAB — CREATININE, SERUM
CREATININE: 0.79 mg/dL (ref 0.44–1.00)
GFR calc Af Amer: >60 mL/min (ref >60)
GFR calc non Af Amer: >60 mL/min (ref >60)

## 2016-06-26 LAB — SURGICAL PCR SCREEN
MRSA, PCR: NEGATIVE
STAPHYLOCOCCUS AUREUS: NEGATIVE

## 2016-06-26 LAB — ABO/RH: ABO/RH(D): O POS

## 2016-06-26 SURGERY — FIXATION, FEMUR, NECK, PERCUTANEOUS, USING SCREW
Anesthesia: General | Site: Hip | Laterality: Right

## 2016-06-26 MED ORDER — DEXAMETHASONE SODIUM PHOSPHATE 10 MG/ML IJ SOLN
INTRAMUSCULAR | Status: DC | PRN
  Administered 2016-06-26: 10 mg via INTRAVENOUS

## 2016-06-26 MED ORDER — FENTANYL CITRATE (PF) 100 MCG/2ML IJ SOLN: 25.0000 ug | INTRAMUSCULAR | Status: DC | PRN

## 2016-06-26 MED ORDER — FENTANYL CITRATE (PF) 100 MCG/2ML IJ SOLN
INTRAMUSCULAR | Status: DC | PRN
  Administered 2016-06-26: 50 ug via INTRAVENOUS
  Administered 2016-06-26 (×2): 25 ug via INTRAVENOUS

## 2016-06-26 MED ORDER — LACTATED RINGERS IV SOLN
INTRAVENOUS | Status: DC
  Administered 2016-06-26 (×2): via INTRAVENOUS

## 2016-06-26 MED ORDER — ACETAMINOPHEN 650 MG RE SUPP: 650.0000 mg | Freq: Four times a day (QID) | RECTAL | Status: DC | PRN

## 2016-06-26 MED ORDER — CEFAZOLIN SODIUM-DEXTROSE 2-4 GM/100ML-% IV SOLN
INTRAVENOUS | Status: AC
  Filled 2016-06-26: qty 100

## 2016-06-26 MED ORDER — ONDANSETRON HCL 4 MG/2ML IJ SOLN
INTRAMUSCULAR | Status: AC
  Filled 2016-06-26: qty 2

## 2016-06-26 MED ORDER — BUPIVACAINE HCL (PF) 0.25 % IJ SOLN
INTRAMUSCULAR | Status: AC
  Filled 2016-06-26: qty 30

## 2016-06-26 MED ORDER — EPHEDRINE 5 MG/ML INJ
INTRAVENOUS | Status: AC
  Filled 2016-06-26: qty 10

## 2016-06-26 MED ORDER — 0.9 % SODIUM CHLORIDE (POUR BTL) OPTIME
TOPICAL | Status: DC | PRN
  Administered 2016-06-26: 1000 mL

## 2016-06-26 MED ORDER — ACETAMINOPHEN 10 MG/ML IV SOLN
INTRAVENOUS | Status: AC
  Filled 2016-06-26: qty 100

## 2016-06-26 MED ORDER — CEFAZOLIN SODIUM-DEXTROSE 1-4 GM/50ML-% IV SOLN
1.0000 g | Freq: Four times a day (QID) | INTRAVENOUS | Status: AC
  Administered 2016-06-27 (×2): 1 g via INTRAVENOUS
  Filled 2016-06-26 (×2): qty 50

## 2016-06-26 MED ORDER — LIDOCAINE 2% (20 MG/ML) 5 ML SYRINGE
INTRAMUSCULAR | Status: AC
  Filled 2016-06-26: qty 5

## 2016-06-26 MED ORDER — CEFAZOLIN SODIUM-DEXTROSE 2-4 GM/100ML-% IV SOLN
2.0000 g | INTRAVENOUS | Status: AC
  Administered 2016-06-26: 2 g via INTRAVENOUS

## 2016-06-26 MED ORDER — ENOXAPARIN SODIUM 40 MG/0.4ML ~~LOC~~ SOLN
40.0000 mg | SUBCUTANEOUS | Status: DC
  Administered 2016-06-27 – 2016-06-28 (×2): 40 mg via SUBCUTANEOUS
  Filled 2016-06-26 (×2): qty 0.4

## 2016-06-26 MED ORDER — METOPROLOL SUCCINATE ER 25 MG PO TB24
25.0000 mg | ORAL_TABLET | Freq: Every day | ORAL | Status: DC
  Administered 2016-06-26 – 2016-06-29 (×4): 25 mg via ORAL
  Filled 2016-06-26 (×4): qty 1

## 2016-06-26 MED ORDER — FENTANYL CITRATE (PF) 100 MCG/2ML IJ SOLN
INTRAMUSCULAR | Status: AC
  Filled 2016-06-26: qty 2

## 2016-06-26 MED ORDER — ENSURE ENLIVE PO LIQD
237.0000 mL | Freq: Three times a day (TID) | ORAL | Status: DC
  Administered 2016-06-27 – 2016-06-28 (×5): 237 mL via ORAL

## 2016-06-26 MED ORDER — PROPOFOL 10 MG/ML IV BOLUS
INTRAVENOUS | Status: DC | PRN
  Administered 2016-06-26: 100 mg via INTRAVENOUS

## 2016-06-26 MED ORDER — EPHEDRINE SULFATE 50 MG/ML IJ SOLN
INTRAMUSCULAR | Status: DC | PRN
  Administered 2016-06-26 (×2): 10 mg via INTRAVENOUS

## 2016-06-26 MED ORDER — MIDAZOLAM HCL 2 MG/2ML IJ SOLN
INTRAMUSCULAR | Status: AC
  Filled 2016-06-26: qty 2

## 2016-06-26 MED ORDER — ADULT MULTIVITAMIN W/MINERALS CH
1.0000 | ORAL_TABLET | Freq: Every day | ORAL | Status: DC
  Administered 2016-06-27 – 2016-06-29 (×3): 1 via ORAL
  Filled 2016-06-26 (×3): qty 1

## 2016-06-26 MED ORDER — LIDOCAINE HCL (CARDIAC) 20 MG/ML IV SOLN
INTRAVENOUS | Status: DC | PRN
  Administered 2016-06-26: 100 mg via INTRAVENOUS

## 2016-06-26 MED ORDER — TRAMADOL HCL 50 MG PO TABS
50.0000 mg | ORAL_TABLET | Freq: Four times a day (QID) | ORAL | Status: DC | PRN
  Administered 2016-06-26 – 2016-06-27 (×2): 50 mg via ORAL
  Administered 2016-06-27 – 2016-06-29 (×5): 100 mg via ORAL
  Filled 2016-06-26: qty 2
  Filled 2016-06-26: qty 1
  Filled 2016-06-26 (×2): qty 2
  Filled 2016-06-26: qty 1
  Filled 2016-06-26 (×2): qty 2
  Filled 2016-06-26: qty 1
  Filled 2016-06-26 (×2): qty 2

## 2016-06-26 MED ORDER — PROPOFOL 10 MG/ML IV BOLUS
INTRAVENOUS | Status: AC
  Filled 2016-06-26: qty 20

## 2016-06-26 MED ORDER — ACETAMINOPHEN 10 MG/ML IV SOLN
1000.0000 mg | INTRAVENOUS | Status: AC
  Administered 2016-06-26: 1000 mg via INTRAVENOUS

## 2016-06-26 MED ORDER — ACETAMINOPHEN 325 MG PO TABS: 650.0000 mg | ORAL_TABLET | Freq: Four times a day (QID) | ORAL | Status: DC | PRN

## 2016-06-26 MED ORDER — BUPIVACAINE HCL (PF) 0.25 % IJ SOLN
INTRAMUSCULAR | Status: DC | PRN
  Administered 2016-06-26: 20 mL

## 2016-06-26 MED ORDER — DEXAMETHASONE SODIUM PHOSPHATE 10 MG/ML IJ SOLN
INTRAMUSCULAR | Status: AC
  Filled 2016-06-26: qty 1

## 2016-06-26 MED ORDER — ATORVASTATIN CALCIUM 40 MG PO TABS
40.0000 mg | ORAL_TABLET | Freq: Every day | ORAL | Status: DC
  Administered 2016-06-27 – 2016-06-28 (×2): 40 mg via ORAL
  Filled 2016-06-26 (×2): qty 1

## 2016-06-26 SURGICAL SUPPLY — 46 items
BAG ZIPLOCK 12X15 (MISCELLANEOUS) ×3 IMPLANT
BENZOIN TINCTURE PRP APPL 2/3 (GAUZE/BANDAGES/DRESSINGS) ×3 IMPLANT
BNDG GAUZE ELAST 4 BULKY (GAUZE/BANDAGES/DRESSINGS) ×3 IMPLANT
CLOSURE WOUND 1/2 X4 (GAUZE/BANDAGES/DRESSINGS) ×2
COVER SURGICAL LIGHT HANDLE (MISCELLANEOUS) ×3 IMPLANT
DRAPE STERI IOBAN 125X83 (DRAPES) ×3 IMPLANT
DRSG EMULSION OIL 3X16 NADH (GAUZE/BANDAGES/DRESSINGS) ×3 IMPLANT
DRSG MEPILEX BORDER 4X4 (GAUZE/BANDAGES/DRESSINGS) ×3 IMPLANT
DRSG MEPILEX BORDER 4X8 (GAUZE/BANDAGES/DRESSINGS) ×3 IMPLANT
DRSG PAD ABDOMINAL 8X10 ST (GAUZE/BANDAGES/DRESSINGS) ×3 IMPLANT
DURAPREP 26ML APPLICATOR (WOUND CARE) ×3 IMPLANT
ELECT REM PT RETURN 15FT ADLT (MISCELLANEOUS) ×3 IMPLANT
EVACUATOR 1/8 PVC DRAIN (DRAIN) IMPLANT
GAUZE SPONGE 4X4 12PLY STRL (GAUZE/BANDAGES/DRESSINGS) ×3 IMPLANT
GLOVE BIO SURGEON STRL SZ7.5 (GLOVE) ×3 IMPLANT
GLOVE BIO SURGEON STRL SZ8 (GLOVE) ×6 IMPLANT
GLOVE BIOGEL PI IND STRL 8 (GLOVE) ×3 IMPLANT
GLOVE BIOGEL PI INDICATOR 8 (GLOVE) ×6
GOWN STRL REUS W/TWL LRG LVL3 (GOWN DISPOSABLE) ×3 IMPLANT
GOWN STRL REUS W/TWL XL LVL3 (GOWN DISPOSABLE) ×3 IMPLANT
MANIFOLD NEPTUNE II (INSTRUMENTS) ×3 IMPLANT
NEEDLE PRECISIONGLIDE 27X1.5 (NEEDLE) ×3 IMPLANT
NS IRRIG 1000ML POUR BTL (IV SOLUTION) ×3 IMPLANT
PACK GENERAL/GYN (CUSTOM PROCEDURE TRAY) ×3 IMPLANT
PAD CAST 4YDX4 CTTN HI CHSV (CAST SUPPLIES) ×1 IMPLANT
PADDING CAST COTTON 4X4 STRL (CAST SUPPLIES) ×2
PADDING CAST COTTON 6X4 STRL (CAST SUPPLIES) ×6 IMPLANT
PIN THREADED GUIDE ACE (PIN) ×9 IMPLANT
POSITIONER SURGICAL ARM (MISCELLANEOUS) ×3 IMPLANT
SCREW CANN 6.5 75MM (Screw) ×2 IMPLANT
SCREW CANN 6.5 80MM (Screw) ×2 IMPLANT
SCREW CANN 6.5 85MM (Screw) ×2 IMPLANT
SCREW CANN LG 6.5 FLT 75X22 (Screw) ×1 IMPLANT
SCREW CANN LG 6.5 FLT 80X22 (Screw) ×1 IMPLANT
SCREW CANN LG 6.5 FLT 85X22 (Screw) ×1 IMPLANT
SPONGE LAP 18X18 X RAY DECT (DISPOSABLE) ×3 IMPLANT
STRIP CLOSURE SKIN 1/2X4 (GAUZE/BANDAGES/DRESSINGS) ×4 IMPLANT
SUT MNCRL AB 4-0 PS2 18 (SUTURE) ×3 IMPLANT
SUT VIC AB 1 CT1 36 (SUTURE) IMPLANT
SUT VIC AB 2-0 CT1 27 (SUTURE)
SUT VIC AB 2-0 CT1 TAPERPNT 27 (SUTURE) IMPLANT
SYR CONTROL 10ML LL (SYRINGE) ×3 IMPLANT
TAPE STRIPS DRAPE STRL (GAUZE/BANDAGES/DRESSINGS) ×3 IMPLANT
TOWEL OR 17X26 10 PK STRL BLUE (TOWEL DISPOSABLE) ×6 IMPLANT
TRAY FOLEY W/METER SILVER 16FR (SET/KITS/TRAYS/PACK) IMPLANT
WATER STERILE IRR 1500ML POUR (IV SOLUTION) ×3 IMPLANT

## 2016-06-26 NOTE — Progress Notes (Signed)
Initial Nutrition Assessment  DOCUMENTATION CODES:   Severe malnutrition in context of chronic illness  INTERVENTION:   Ensure Enlive po TID, each supplement provides 350 kcal and 20 grams of protein  Magic cup TID with meals, each supplement provides 290 kcal and 9 grams of protein  MVI  Pt requests to have DYS 3 diet when diet advanced   NUTRITION DIAGNOSIS:   Malnutrition (severe) related to chronic illness, poor appetite (advanced age) as evidenced by severe depletion of body fat, severe depletion of muscle mass, 6 percent weight loss in 2 months.  GOAL:   Patient will meet greater than or equal to 90% of their needs  MONITOR:   PO intake, Supplement acceptance, Labs, Weight trends, Skin  REASON FOR ASSESSMENT:   Malnutrition Screening Tool, Consult Hip fracture protocol  ASSESSMENT:   83 y.o. female with medical history significant of gait instability, hypertension, hyperlipidemia, hypothyroidism, depression, pulmonary embolism and PAF on Coumadin, chronic lower back pain, dCHF, anemia, carotid artery stenosis, mitral valve regurgitation (s/p of repair), who presents with fall and right hip pain. Pt found to have sustained a nondisplaced subcapital right femoral neck fracture.   Met with pt in room today. Pt reports poor appetite and decreased oral intake for several months pta. Per chart, pt has lost 7lbs(6%) in 2 months; this is significant given history. Pt currently NPO for surgery today. Pt requesting to be on DYS 3 diet after surgery as she has few teeth. Pt also requests Ensure and reports that she drink 1-2 Ensures at home every day. RD discussed with pt the importance of adequate protein intake for healing and to preserve lean muscle mass. Pt understands and will attempt to drink 2 Ensure per day.   Medications reviewed and include: Vit D, synthroid, cefazolin, heparin, hydrocodone, morphine  Labs reviewed: BUN 26(H)  Nutrition-Focused physical exam completed.  Findings are severe fat depletion, severe muscle depletion, and mild edema.   Diet Order:  Diet NPO time specified  Skin:  Reviewed, no issues  Last BM:  none since admit  Height:   Ht Readings from Last 1 Encounters:  06/25/16 5' 2"  (1.575 m)    Weight:   Wt Readings from Last 1 Encounters:  06/25/16 109 lb 5.6 oz (49.6 kg)    Ideal Body Weight:  50 kg  BMI:  Body mass index is 20 kg/m.  Estimated Nutritional Needs:   Kcal:  1500-1700kcal/day   Protein:  74-84g/day   Fluid:  >1.5L/day   EDUCATION NEEDS:   No education needs identified at this time  Koleen Distance, RD, LDN Pager #[phone]

## 2016-06-26 NOTE — Op Note (Signed)
OPERATIVE REPORT   POSTOPERATIVE DIAGNOSIS: Right femoral neck fracture.   POSTOPERATIVE DIAGNOSIS: Right  femoral neck fracture.   PROCEDURE: In situ pinning, Right hip.   SURGEON: Gaynelle Arabian, M.D.   ASSISTANT: None.   ANESTHESIA: General.   ESTIMATED BLOOD LOSS: Minimal.   DRAINS: None.   COMPLICATIONS: None.   CONDITION: Stable to recovery.   BRIEF CLINICAL NOTE: Desiree [name] is an 83 y.o. female, had a fall yesterday sustaining a minimally displaced  Right femoral neck  fracture. The patient has intense pain in the Right hip.They have been cleared medically. The patient presents for in situ pinning of the Right femoral neck fracture.  PROCEDURE IN DETAIL: After successful administration of General. anesthetic, the patient was placed on the fracture table. The Right lower extremity in well-padded traction boot, Left lower extremity in well-padded leg holder. We did not need to apply traction as this was a nondisplaced fracture. The C-arm spot AP and lateral confirmed that it  did not displace during positioning on the bed. Thigh was then prepped  and draped in usual sterile fashion. The guide pin for the [employer]  cannulated screws was placed over the top of the thigh   so that it projects in the center of femoral head  and neck on AP fluoroscopic view. This served as a marker for the incision which was made over the lateral part of the femur. About a 2-inch incision was then  made at the appropriate place. The skin was cut through the  subcutaneous tissue to the fascia lata, which was incised in line with  skin incision. Guidepin was passed so that this is in the center of  femoral head and neck on the AP view. Another one was placed inferior  to this and slightly posterior and another one was placed superior to  this and slightly posterior. The lengths are 75, 75, and 85  respectively. The screws were passed over the guidepin and tightened  down to the lateral cortex of  the femur with excellent purchase of the  screws. It effectively compressed the fracture site. The guidepins  were removed. Fluoro spots were taken AP and lateral, confirming excellent  position of hardware. The incision was then cleaned and dried, and the  deep tissue was closed with interrupted 2-0 Vicryl, subcu with  interrupted 2-0 Vicryl, subcuticular running 4-0 Monocryl.  20 mL of 0.25% Marcaine  was injected into the subcutaneous tissues. The  incision was cleaned and dried. Steri-Strips and sterile dressing  applied. The patient was awakened and transported to recovery in stable  condition.   Gaynelle Arabian, M.D.

## 2016-06-26 NOTE — Anesthesia Procedure Notes (Addendum)
Procedure Name: LMA Insertion Date/Time: 06/26/2016 6:18 PM Performed by: Claudia Desanctis Pre-anesthesia Checklist: Emergency Drugs available, Patient identified, Suction available and Patient being monitored Patient Re-evaluated:Patient Re-evaluated prior to inductionOxygen Delivery Method: Circle system utilized Preoxygenation: Pre-oxygenation with 100% oxygen Intubation Type: IV induction LMA: LMA inserted LMA Size: 3.0 Number of attempts: 1 Placement Confirmation: positive ETCO2 and breath sounds checked- equal and bilateral Tube secured with: Tape Dental Injury: Teeth and Oropharynx as per pre-operative assessment

## 2016-06-26 NOTE — Care Management Note (Signed)
Case Management Note  Patient Details  Name: PENIEL BIEL MRN: [id number] Date of Birth: 14-Jul-1924  Subjective/Objective:   Pt presents with fall and right hip pain.  Fx rt hip, going for surgery.                     Action/Plan: surgery scheduled for today   Expected Discharge Date:                  Expected Discharge Plan:  [place]  In-House Referral:  Clinical Social Work  Discharge planning Services  CM Consult  Post Acute Care Choice:    Choice offered to:     DME Arranged:    DME Agency:     HH Arranged:    [place] Agency:     Status of Service:  In process, will continue to follow  If discussed at Long Length of Stay Meetings, dates discussed:    Additional CommentsPurcell Mouton, RN 06/26/2016, 2:54 PM

## 2016-06-26 NOTE — Progress Notes (Signed)
CSW following for discharge needs. Patient is scheduled for surgery. PT is pending.  Kathrin Greathouse, Latanya Presser, MSW Clinical Social Worker 5E and Psychiatric Service Line [phone] 06/26/2016  12:02 PM

## 2016-06-26 NOTE — Consult Note (Signed)
Reason for Consult:  Right hip pain following fall Referring Physician: Ivor Costa  [facility]  Desiree [name] is an 83 y.o. female.  HPI: Patient is an 83 year old female that sustained injuries during a fall at home in the yard when she was outside with her dog.  It was reported that she was found by neighbors after laying for about two hours.  She was unable to stand and bear weight.  She was transported to the hospital where she was found to have sustained a nondisplaced subcapital right femoral neck fracture.  Dr. Wynelle Link was on call for [facility] and consulted for orthopedic care.  History obtained from family which states that the patient has been quite unsteady for some time and has had multiple falls in the past.  Her daughter is the patient's POA and wishes to proceed with surgery.  Risks and benefits have been discussed with the family and patient will be scheduled for later this afternoon.  Past Medical History:  Diagnosis Date  . Anemia   . Cancer ([place])    skin cancer removed from top of head.per pt  . Carotid artery disease ([place])   . CHF (congestive heart failure), NYHA class II (HCC)    has sudden onset decom CHF? 2/2 to PNA  . Diverticulosis    chronic issues with consitpation and diarrhea  . Heart murmur   . Hypercholesterolemia   . Hypertension   . Hypertensive heart disease without CHF   . Hypothyroidism   . Irregular heart beat   . Lumbar disc disease   . Lumbar disc disease   . Mitral regurgitation   . Osteoarthritis    chronic-on multiple pain meds.  . Paroxysmal atrial fibrillation (HCC)    not on coumadin-Cards=[place]-saw him ?1 yr ago  . Pneumonia    few times  . Urinary tract infection    hx of UTI    Past Surgical History:  Procedure Laterality Date  . ABDOMINAL HYSTERECTOMY    . CARDIAC CATHETERIZATION  01/14/11  . CAROTID ENDARTERECTOMY Right 10-09-03   with resection of redundant CCA  . CARPAL TUNNEL RELEASE     rt  .  CATARACT EXTRACTION     bil  . CHEST TUBE INSERTION  02/05/2011   Procedure: INSERTION PLEURAL DRAINAGE CATHETER;  Surgeon: Rexene Alberts, MD;  Location: [facility];  Service: Thoracic;  Laterality: Right;  . EYE SURGERY    . FRACTURE SURGERY  Aug. 2013   Right wrist   . KNEE SURGERY     bilateral arthroscopy  . LEFT AND RIGHT HEART CATHETERIZATION WITH CORONARY ANGIOGRAM N/A 01/14/2011   Procedure: LEFT AND RIGHT HEART CATHETERIZATION WITH CORONARY ANGIOGRAM;  Surgeon: Jacolyn Reedy, MD;  Location: [facility] CATH LAB;  Service: Cardiovascular;  Laterality: N/A;  . MITRAL VALVE REPAIR  01/21/2011   Procedure: MITRAL VALVE REPAIR (MVR);  Surgeon: Rexene Alberts, MD;  Location: [place] [facility];  Service: Open Heart Surgery;  Laterality: N/A;  . REMOVAL OF PLEURAL DRAINAGE CATHETER  04/09/2011   Procedure: REMOVAL OF PLEURAL DRAINAGE CATHETER;  Surgeon: Rexene Alberts, MD;  Location: [facility];  Service: Thoracic;  Laterality: Right;  . ROTATOR CUFF REPAIR     2 on right, 1 on left  . TALC PLEURODESIS  04/09/2011   Procedure: Pietro Cassis;  Surgeon: Rexene Alberts, MD;  Location: [facility];  Service: Thoracic;  Laterality: Right;  . TEE WITHOUT CARDIOVERSION  01/13/2011   Procedure: TRANSESOPHAGEAL ECHOCARDIOGRAM (  TEE);  Surgeon: Josue Hector, MD;  Location: [facility] ENDOSCOPY;  Service: Cardiovascular;  Laterality: Left;    Family History  Problem Relation Age of Onset  . Cancer Mother   . Stroke Father   . Cancer Father   . Diabetes Sister   . Heart disease Sister     Heart Disease before age 53  . Heart disease Sister   . Cancer Brother     Social History:  reports that she has never smoked. She has never used smokeless tobacco. She reports that she does not drink alcohol or use drugs.  Allergies:  Allergies  Allergen Reactions  . Celebrex [Celecoxib] Other (See Comments)    Reaction=anemia/leukopenia  . Diovan [Valsartan] Other (See Comments)    Reaction=increase potassium/acute renal failure     Medications: I have reviewed the patient's current medications.  atorvastatin (LIPITOR) 40 MG tablet Take 40 mg by mouth daily at 6 PM. Ivor Costa, MD Reordered    cholecalciferol (VITAMIN D) 1000 UNITS tablet Take 1,000 Units by mouth daily. Ivor Costa, MD Reordered    gabapentin (NEURONTIN) 100 MG capsule Take 4 capsules at night Ivor Costa, MD Reordered   Patient taking differently: Take 300 mg by mouth at bedtime.       HYDROcodone-acetaminophen (NORCO/VICODIN) 5-325 MG tablet Take 2 tablets by mouth every 6 (six) hours as needed for moderate pain.  Ivor Costa, MD Reordered    levothyroxine (SYNTHROID, LEVOTHROID) 75 MCG tablet Take 75 mcg by mouth daily.  Ivor Costa, MD Reordered    metoprolol tartrate (LOPRESSOR) 25 MG tablet Take 25 mg by mouth 2 (two) times daily.  Ivor Costa, MD Reordered    polyethylene glycol [facility] / GLYCOLAX) packet Take 17 g by mouth daily as needed for mild constipation.  Ivor Costa, MD Reordered    TOVIAZ 8 MG TB24 tablet Take 8 mg by mouth every evening.  Ivor Costa, MD Reordered    warfarin (COUMADIN) 2.5 MG tablet Take 2.5 mg by mouth daily at 6 PM. Ivor Costa, MD Not Ordered   Coumadin ON HOLD for surgery.  Results for orders placed or performed during the hospital encounter of 06/25/16 (from the past 48 hour(s))  CBC with Differential     Status: Abnormal   Collection Time: 06/25/16  6:29 PM  Result Value Ref Range   WBC 10.2 4.0 - 10.5 K/uL   RBC 3.56 (L) 3.87 - 5.11 MIL/uL   Hemoglobin 10.9 (L) 12.0 - 15.0 g/dL   HCT 33.7 (L) 36.0 - 46.0 %   MCV 94.7 78.0 - 100.0 fL   MCH 30.6 26.0 - 34.0 pg   MCHC 32.3 30.0 - 36.0 g/dL   RDW 14.2 11.5 - 15.5 %   Platelets 142 (L) 150 - 400 K/uL   Neutrophils Relative % 87 %   Neutro Abs 8.8 (H) 1.7 - 7.7 K/uL   Lymphocytes Relative 8 %   Lymphs Abs 0.8 0.7 - 4.0 K/uL   Monocytes Relative 4 %   Monocytes Absolute 0.5 0.1 - 1.0 K/uL   Eosinophils Relative 1 %   Eosinophils Absolute 0.1 0.0 - 0.7 K/uL    Basophils Relative 0 %   Basophils Absolute 0.0 0.0 - 0.1 K/uL  Basic metabolic panel     Status: Abnormal   Collection Time: 06/25/16  6:29 PM  Result Value Ref Range   Sodium 137 135 - 145 mmol/L   Potassium 3.8 3.5 - 5.1 mmol/L   Chloride 104 101 -  111 mmol/L   CO2 25 22 - 32 mmol/L   Glucose, Bld 115 (H) 65 - 99 mg/dL   BUN 26 (H) 6 - 20 mg/dL   Creatinine, Ser 0.74 0.44 - 1.00 mg/dL   Calcium 9.2 8.9 - 10.3 mg/dL   GFR calc non Af Amer >60 >60 mL/min   GFR calc Af Amer >60 >60 mL/min    Comment: (NOTE) The eGFR has been calculated using the CKD EPI equation. This calculation has not been validated in all clinical situations. eGFR's persistently <60 mL/min signify possible Chronic Kidney Disease.    Anion gap 8 5 - 15  I-stat troponin, ED     Status: None   Collection Time: 06/25/16  6:40 PM  Result Value Ref Range   Troponin i, poc 0.01 0.00 - 0.08 ng/mL   Comment 3            Comment: Due to the release kinetics of cTnI, a negative result within the first hours of the onset of symptoms does not rule out myocardial infarction with certainty. If myocardial infarction is still suspected, repeat the test at appropriate intervals.   I-Stat Chem 8, ED     Status: Abnormal   Collection Time: 06/25/16  6:42 PM  Result Value Ref Range   Sodium 139 135 - 145 mmol/L   Potassium 3.8 3.5 - 5.1 mmol/L   Chloride 102 101 - 111 mmol/L   BUN 26 (H) 6 - 20 mg/dL   Creatinine, Ser 0.70 0.44 - 1.00 mg/dL   Glucose, Bld 110 (H) 65 - 99 mg/dL   Calcium, Ion 1.18 1.15 - 1.40 mmol/L   TCO2 26 0 - 100 mmol/L   Hemoglobin 10.9 (L) 12.0 - 15.0 g/dL   HCT 32.0 (L) 36.0 - 46.0 %  CK     Status: None   Collection Time: 06/25/16  9:15 PM  Result Value Ref Range   Total CK 63 38 - 234 U/L  Brain natriuretic peptide     Status: Abnormal   Collection Time: 06/25/16  9:15 PM  Result Value Ref Range   B Natriuretic Peptide 166.0 (H) 0.0 - 100.0 pg/mL  APTT     Status: Abnormal   Collection  Time: 06/25/16  9:15 PM  Result Value Ref Range   aPTT 37 (H) 24 - 36 seconds    Comment:        IF BASELINE aPTT IS ELEVATED, SUGGEST PATIENT RISK ASSESSMENT BE USED TO DETERMINE APPROPRIATE ANTICOAGULANT THERAPY.   Protime-INR     Status: Abnormal   Collection Time: 06/25/16  9:15 PM  Result Value Ref Range   Prothrombin Time 23.0 (H) 11.4 - 15.2 seconds   INR 2.00   Type and screen [place]     Status: None   Collection Time: 06/25/16 10:06 PM  Result Value Ref Range   ABO/RH(D) O POS    Antibody Screen NEG    Sample Expiration 06/28/2016   Surgical pcr screen     Status: None   Collection Time: 06/26/16  3:18 AM  Result Value Ref Range   MRSA, PCR NEGATIVE NEGATIVE   Staphylococcus aureus NEGATIVE NEGATIVE    Comment:        The Xpert SA Assay (FDA approved for NASAL specimens in patients over 83 years of age), is one component of a comprehensive surveillance program.  Test performance has been validated by [facility] for patients greater than or equal to 28 year old.  It is not intended to diagnose infection nor to guide or monitor treatment.   TSH     Status: None   Collection Time: 06/26/16  5:25 AM  Result Value Ref Range   TSH 1.183 0.350 - 4.500 uIU/mL    Comment: Performed by a 3rd Generation assay with a functional sensitivity of <=0.01 uIU/mL.  Type and screen [place]     Status: None   Collection Time: 06/26/16  5:25 AM  Result Value Ref Range   ABO/RH(D) O POS    Antibody Screen NEG    Sample Expiration 06/29/2016   ABO/Rh     Status: None   Collection Time: 06/26/16  5:25 AM  Result Value Ref Range   ABO/RH(D) O POS     Dg Chest 2 View  Result Date: 06/25/2016 CLINICAL DATA:  Patient fell today.  Pain. EXAM: CHEST  2 VIEW COMPARISON:  None. FINDINGS: The heart size and mediastinal contours are within normal limits. Heart is enlarged. The patient is status post median sternotomy. There is aortic  atherosclerosis without aneurysm. Lungs are free of pneumonic consolidation, effusion and pneumothorax. Old posttraumatic deformity of the superolateral humeral head is again seen possibly from prior shoulder dislocation. No acute fracture of the bony thorax. Stable degenerative changes are seen along the dorsal spine. IMPRESSION: No active cardiopulmonary disease. Aortic atherosclerosis. No acute osseous appearing abnormality. Electronically Signed   By: Ashley Royalty M.D.   On: 06/25/2016 19:23   Ct Head Wo Contrast  Result Date: 06/25/2016 CLINICAL DATA:  Fall injury neck and back pain with dizziness EXAM: CT HEAD WITHOUT CONTRAST CT CERVICAL SPINE WITHOUT CONTRAST TECHNIQUE: Multidetector CT imaging of the head and cervical spine was performed following the standard protocol without intravenous contrast. Multiplanar CT image reconstructions of the cervical spine were also generated. COMPARISON:  11/21/2014, 03/13/2016 FINDINGS: CT HEAD FINDINGS Brain: No acute territorial infarction, hemorrhage or intracranial mass. Moderate periventricular white matter small vessel ischemic changes. Atrophy. Stable ventricle size. Vascular: No hyperdense vessels. Carotid artery and vertebral artery calcification. Skull: Normal. Negative for fracture or focal lesion. Sinuses/Orbits: Mucosal thickening in the ethmoid sinuses. No acute orbital abnormality. Other: None CT CERVICAL SPINE FINDINGS Alignment: Stable alignment with mild anterolisthesis of C7 on T1. Facet alignment similar compared to prior study. Skull base and vertebrae: Craniovertebral junction is intact. No fracture. Soft tissues and spinal canal: No prevertebral fluid or swelling. No visible canal hematoma. Disc levels: Multilevel degenerative disc disease, moderate severe its C2-C3 and C3-C4, severe it C4-C5, C5-C6 and C6-C7. Posterior disc osteophyte complex at C3-C4, C5-C6 and C6-C7. Multilevel bilateral facet arthropathy. Multilevel bilateral foraminal  stenosis, most notable at C5-C6 and C6-C7. Upper chest: No acute changes Other: None IMPRESSION: 1. No CT evidence for acute intracranial abnormality. Moderate small vessel ischemic changes of the white matter along with atrophy 2. No acute fracture or malalignment of the cervical spine. Multilevel advanced degenerative disc changes. Electronically Signed   By: Donavan Foil M.D.   On: 06/25/2016 20:05   Ct Cervical Spine Wo Contrast  Result Date: 06/25/2016 CLINICAL DATA:  Fall injury neck and back pain with dizziness EXAM: CT HEAD WITHOUT CONTRAST CT CERVICAL SPINE WITHOUT CONTRAST TECHNIQUE: Multidetector CT imaging of the head and cervical spine was performed following the standard protocol without intravenous contrast. Multiplanar CT image reconstructions of the cervical spine were also generated. COMPARISON:  11/21/2014, 03/13/2016 FINDINGS: CT HEAD FINDINGS Brain: No acute territorial infarction, hemorrhage or intracranial mass. Moderate periventricular  white matter small vessel ischemic changes. Atrophy. Stable ventricle size. Vascular: No hyperdense vessels. Carotid artery and vertebral artery calcification. Skull: Normal. Negative for fracture or focal lesion. Sinuses/Orbits: Mucosal thickening in the ethmoid sinuses. No acute orbital abnormality. Other: None CT CERVICAL SPINE FINDINGS Alignment: Stable alignment with mild anterolisthesis of C7 on T1. Facet alignment similar compared to prior study. Skull base and vertebrae: Craniovertebral junction is intact. No fracture. Soft tissues and spinal canal: No prevertebral fluid or swelling. No visible canal hematoma. Disc levels: Multilevel degenerative disc disease, moderate severe its C2-C3 and C3-C4, severe it C4-C5, C5-C6 and C6-C7. Posterior disc osteophyte complex at C3-C4, C5-C6 and C6-C7. Multilevel bilateral facet arthropathy. Multilevel bilateral foraminal stenosis, most notable at C5-C6 and C6-C7. Upper chest: No acute changes Other: None  IMPRESSION: 1. No CT evidence for acute intracranial abnormality. Moderate small vessel ischemic changes of the white matter along with atrophy 2. No acute fracture or malalignment of the cervical spine. Multilevel advanced degenerative disc changes. Electronically Signed   By: Donavan Foil M.D.   On: 06/25/2016 20:05   Dg Hip Unilat With Pelvis 2-3 Views Right  Result Date: 06/25/2016 CLINICAL DATA:  Right hip pain and groin pain after fall EXAM: DG HIP (WITH OR WITHOUT PELVIS) 2-3V RIGHT COMPARISON:  Hours 926 FINDINGS: There is an acute, subcapital impacted femoral neck fracture. No dislocation of the right hip joint. Pubic rami appear intact. Slight joint space narrowing of the right hip. Atherosclerotic calcifications are present along the course of the external iliac, common femoral and femoral arteries. IMPRESSION: New impacted right subcapital femoral neck fracture. Electronically Signed   By: Ashley Royalty M.D.   On: 06/25/2016 19:25    ROS Blood pressure (!) 120/44, pulse 81, temperature 97.7 F (36.5 C), temperature source Axillary, resp. rate 20, height 5' 2" (1.575 m), weight 49.6 kg (109 lb 5.6 oz), SpO2 95 %. Physical Exam  Patient is an 83 year old female.  Accompanied by family.  Patient resting comfortably.  History taken from daughter. Right lower extremity - pulses intact, sensation intact.  Radiographs - New impacted right subcapital femoral neck fracture.  Assessment/Plan: Right Subcapital Femoral Neck Fracture  Patient seen in consultation by Dr. Wynelle Link this morning.  Family in room.  Fall at home in which she sustained a right femoral neck fracture that will require hip pinning.  Procedure discussed with family and wish to proceed.  Will allow liquid breakfast this morning and then NPO after ( AM today.  Tentative plan for surgery this afternoon.  Medicine noted possible need for cardiology consult.  Will defer to their recommendation on wether or not to consult  cards.  Coumadin on hold at this time due to planned procedure. Currently on Heparin - WILL NEED TO STOP AT LEAST 6 HOURS PRIOR TO SURGERY. Planned surgery for around 5 or 6 PM later today.  Attached note to Heparin order to stop today at 11 AM. Consent - Right hi pinning NPO after 9 AM ANCEF on call to [place] 06/26/2016, 7:57 AM

## 2016-06-26 NOTE — Consult Note (Signed)
Cardiology Consult    Patient ID: Desiree [name] MRN: [id number], DOB/AGE: 81-Sep-1926   Admit date: 06/25/2016 Date of Consult: 06/26/2016  Primary Physician: Desiree Pepper, MD Primary Cardiologist: Dr. Wynonia [name] Requesting Provider: Dr. Dyann [name]  Reason for Consult: preoperative clearance for right hip surgery  Patient Profile    Desiree [name] is a 83 yo female with a PMH significant for paroxysmal Afib and hx of PE on coumadin, mitral regurgitation s/p MV repair (01/21/11) , hypothyroidism, HTN, HLD, and history of combined heart failure. She is also seen by neurology for vascular dementia. She reported to [name] after suffering a fall on her right hip.   Desiree [name] is a 83 y.o. female who is being seen today for the evaluation of preoperative clearance for hip surgery at the request of Dr. Dyann [name].   Past Medical History   Past Medical History:  Diagnosis Date  . Anemia   . Cancer ([name])    skin cancer removed from top of head.per pt  . Carotid artery disease ([place])   . CHF (congestive heart failure), NYHA class II (HCC)    has sudden onset decom CHF? 2/2 to PNA  . Diverticulosis    chronic issues with consitpation and diarrhea  . Heart murmur   . Hypercholesterolemia   . Hypertension   . Hypertensive heart disease without CHF   . Hypothyroidism   . Irregular heart beat   . Lumbar disc disease   . Lumbar disc disease   . Mitral regurgitation   . Osteoarthritis    chronic-on multiple pain meds.  . Paroxysmal atrial fibrillation (HCC)    not on coumadin-Cards=Desiree [name]-saw him ?1 yr ago  . Pneumonia    few times  . Urinary tract infection    hx of UTI    Past Surgical History:  Procedure Laterality Date  . ABDOMINAL HYSTERECTOMY    . CARDIAC CATHETERIZATION  01/14/11  . CAROTID ENDARTERECTOMY Right 10-09-03   with resection of redundant CCA  . CARPAL TUNNEL RELEASE     rt  . CATARACT EXTRACTION     bil  . CHEST TUBE INSERTION  02/05/2011   Procedure: INSERTION PLEURAL  DRAINAGE CATHETER;  Surgeon: Desiree Alberts, MD;  Location: [name];  Service: Thoracic;  Laterality: Right;  . EYE SURGERY    . FRACTURE SURGERY  Aug. 2013   Right wrist   . KNEE SURGERY     bilateral arthroscopy  . LEFT AND RIGHT HEART CATHETERIZATION WITH CORONARY ANGIOGRAM N/A 01/14/2011   Procedure: LEFT AND RIGHT HEART CATHETERIZATION WITH CORONARY ANGIOGRAM;  Surgeon: Desiree Reedy, MD;  Location: [name];  Service: Cardiovascular;  Laterality: N/A;  . MITRAL VALVE REPAIR  01/21/2011   Procedure: MITRAL VALVE REPAIR (MVR);  Surgeon: Desiree Alberts, MD;  Location: [name];  Service: Open Heart Surgery;  Laterality: N/A;  . REMOVAL OF PLEURAL DRAINAGE CATHETER  04/09/2011   Procedure: REMOVAL OF PLEURAL DRAINAGE CATHETER;  Surgeon: Desiree Alberts, MD;  Location: [name];  Service: Thoracic;  Laterality: Right;  . ROTATOR CUFF REPAIR     2 on right, 1 on left  . TALC PLEURODESIS  04/09/2011   Procedure: [name];  Surgeon: Desiree Alberts, MD;  Location: [name];  Service: Thoracic;  Laterality: Right;  . TEE WITHOUT CARDIOVERSION  01/13/2011   Procedure: TRANSESOPHAGEAL ECHOCARDIOGRAM (TEE);  Surgeon: Desiree Hector, MD;  Location: [name];  Service: Cardiovascular;  Laterality: Left;     Allergies  Allergies  Allergen Reactions  . Celebrex [Celecoxib] Other (See Comments)    Reaction:  Anemia/leukopenia  . Diovan [Valsartan] Other (See Comments)    Reaction:  Increases potassium levels/acute renal failure     History of Present Illness    Desiree Resch has been in her usual state of health s/p mitral valve repair on 01/21/11. She suffered a fall in the yard while playing with her dog on a leash on 06/25/16. She reports no pre-syncope or loss of consciousness; she denies palpitations, chest pain, and SOB before and after the fall.     Inpatient Medications    . atorvastatin  40 mg Oral q1800  . cholecalciferol  1,000 Units Oral Daily  . fesoterodine  8 mg Oral Daily    . gabapentin  400 mg Oral QHS  . levothyroxine  75 mcg Oral QAC breakfast  . metoprolol succinate  25 mg Oral Daily     Outpatient Medications    Prior to Admission medications   Medication Sig Start Date End Date Taking? Authorizing Provider  atorvastatin (LIPITOR) 40 MG tablet Take 40 mg by mouth every evening.    Yes Historical Provider, MD  cholecalciferol (VITAMIN D) 1000 UNITS tablet Take 1,000 Units by mouth daily.   Yes Historical Provider, MD  gabapentin (NEURONTIN) 100 MG capsule Take 4 capsules at night Patient taking differently: Take 400 mg by mouth at bedtime.  12/06/15  Yes Cameron Sprang, MD  HYDROcodone-acetaminophen (NORCO/VICODIN) 5-325 MG tablet Take 1-2 tablets by mouth every 6 (six) hours as needed for moderate pain.    Yes Historical Provider, MD  levothyroxine (SYNTHROID, LEVOTHROID) 75 MCG tablet Take 75 mcg by mouth daily before breakfast.    Yes Historical Provider, MD  metoprolol succinate (TOPROL-XL) 25 MG 24 hr tablet Take 25 mg by mouth daily.   Yes Historical Provider, MD  polyethylene glycol (MIRALAX / GLYCOLAX) packet Take 17 g by mouth daily as needed for mild constipation.    Yes Historical Provider, MD  TOVIAZ 8 MG TB24 tablet Take 8 mg by mouth every evening.    Yes Historical Provider, MD  warfarin (COUMADIN) 2.5 MG tablet Take 2.5 mg by mouth every evening.    Yes Historical Provider, MD     Family History    Family History  Problem Relation Age of Onset  . Cancer Mother   . Stroke Father   . Cancer Father   . Diabetes Sister   . Heart disease Sister     Heart Disease before age 41  . Heart disease Sister   . Cancer Brother     Social History    Social History   Social History  . Marital status: Widowed    Spouse name: N/A  . Number of children: 2  . Years of education: N/A   Occupational History  . Nurse, learning disability [employer]   Social History Main Topics  . Smoking status: Never Smoker  . Smokeless tobacco:  Never Used  . Alcohol use No  . Drug use: No  . Sexual activity: Yes    Birth control/ protection: Post-menopausal   Other Topics Concern  . Not on file   Social History Narrative   Lives alone   Works full time   Still drives and very active usually     Review of Systems    General:  No chills, fever, night sweats or weight changes.  Cardiovascular:  No chest pain, dyspnea on exertion,  edema, orthopnea, palpitations, paroxysmal nocturnal dyspnea. Dermatological: No rash, lesions/masses Respiratory: No cough, dyspnea Urologic: No hematuria, dysuria Abdominal:   No nausea, vomiting, diarrhea, bright red blood per rectum, melena, or hematemesis Neurologic:  No visual changes, wkns, changes in mental status. All other systems reviewed and are otherwise negative except as noted above.  Physical Exam    Blood pressure (!) 120/44, pulse 81, temperature 97.7 F (36.5 C), temperature source Axillary, resp. rate 20, height 5\' 2"  (1.575 m), weight 109 lb 5.6 oz (49.6 kg), SpO2 95 %.  General: Pleasant, NAD Psych: Normal affect. Neuro: Alert and oriented X 3. Moves all extremities spontaneously. HEENT: Normal  Neck: Supple without bruits or JVD. Lungs:  Resp regular and unlabored, CTA, diminished in bases Heart: RRR no murmurs. Abdomen: Soft, non-tender, non-distended, BS + x 4.  Extremities: No clubbing, cyanosis or edema. DP/PT/Radials 2+ and equal bilaterally.  Labs    Troponin [name])  Recent Labs  06/25/16 1840  TROPIPOC 0.01    Recent Labs  06/25/16 2115  CKTOTAL 63   Lab Results  Component Value Date   WBC 10.2 06/25/2016   HGB 10.9 (L) 06/25/2016   HCT 32.0 (L) 06/25/2016   MCV 94.7 06/25/2016   PLT 142 (L) 06/25/2016    Recent Labs Lab 06/25/16 1829 06/25/16 1842  NA 137 139  K 3.8 3.8  CL 104 102  CO2 25  --   BUN 26* 26*  CREATININE 0.74 0.70  CALCIUM 9.2  --   GLUCOSE 115* 110*   Lab Results  Component Value Date   CHOL 116  01/11/2011   HDL 39 (L) 01/11/2011   LDLCALC 66 01/11/2011   TRIG 57 01/11/2011   No results found for: [name]   Radiology Studies    Dg Chest 2 View  Result Date: 06/25/2016 CLINICAL DATA:  Patient fell today.  Pain. EXAM: CHEST  2 VIEW COMPARISON:  None. FINDINGS: The heart size and mediastinal contours are within normal limits. Heart is enlarged. The patient is status post median sternotomy. There is aortic atherosclerosis without aneurysm. Lungs are free of pneumonic consolidation, effusion and pneumothorax. Old posttraumatic deformity of the superolateral humeral head is again seen possibly from prior shoulder dislocation. No acute fracture of the bony thorax. Stable degenerative changes are seen along the dorsal spine. IMPRESSION: No active cardiopulmonary disease. Aortic atherosclerosis. No acute osseous appearing abnormality. Electronically Signed   By: Ashley Royalty M.D.   On: 06/25/2016 19:23   Ct Head Wo Contrast  Result Date: 06/25/2016 CLINICAL DATA:  Fall injury neck and back pain with dizziness EXAM: CT HEAD WITHOUT CONTRAST CT CERVICAL SPINE WITHOUT CONTRAST TECHNIQUE: Multidetector CT imaging of the head and cervical spine was performed following the standard protocol without intravenous contrast. Multiplanar CT image reconstructions of the cervical spine were also generated. COMPARISON:  11/21/2014, 03/13/2016 FINDINGS: CT HEAD FINDINGS Brain: No acute territorial infarction, hemorrhage or intracranial mass. Moderate periventricular white matter small vessel ischemic changes. Atrophy. Stable ventricle size. Vascular: No hyperdense vessels. Carotid artery and vertebral artery calcification. Skull: Normal. Negative for fracture or focal lesion. Sinuses/Orbits: Mucosal thickening in the ethmoid sinuses. No acute orbital abnormality. Other: None CT CERVICAL SPINE FINDINGS Alignment: Stable alignment with mild anterolisthesis of C7 on T1. Facet alignment similar compared to prior study. Skull  base and vertebrae: Craniovertebral junction is intact. No fracture. Soft tissues and spinal canal: No prevertebral fluid or swelling. No visible canal hematoma. Disc levels: Multilevel degenerative disc disease,  moderate severe its C2-C3 and C3-C4, severe it C4-C5, C5-C6 and C6-C7. Posterior disc osteophyte complex at C3-C4, C5-C6 and C6-C7. Multilevel bilateral facet arthropathy. Multilevel bilateral foraminal stenosis, most notable at C5-C6 and C6-C7. Upper chest: No acute changes Other: None IMPRESSION: 1. No CT evidence for acute intracranial abnormality. Moderate small vessel ischemic changes of the white matter along with atrophy 2. No acute fracture or malalignment of the cervical spine. Multilevel advanced degenerative disc changes. Electronically Signed   By: Donavan Foil M.D.   On: 06/25/2016 20:05   Ct Cervical Spine Wo Contrast  Result Date: 06/25/2016 CLINICAL DATA:  Fall injury neck and back pain with dizziness EXAM: CT HEAD WITHOUT CONTRAST CT CERVICAL SPINE WITHOUT CONTRAST TECHNIQUE: Multidetector CT imaging of the head and cervical spine was performed following the standard protocol without intravenous contrast. Multiplanar CT image reconstructions of the cervical spine were also generated. COMPARISON:  11/21/2014, 03/13/2016 FINDINGS: CT HEAD FINDINGS Brain: No acute territorial infarction, hemorrhage or intracranial mass. Moderate periventricular white matter small vessel ischemic changes. Atrophy. Stable ventricle size. Vascular: No hyperdense vessels. Carotid artery and vertebral artery calcification. Skull: Normal. Negative for fracture or focal lesion. Sinuses/Orbits: Mucosal thickening in the ethmoid sinuses. No acute orbital abnormality. Other: None CT CERVICAL SPINE FINDINGS Alignment: Stable alignment with mild anterolisthesis of C7 on T1. Facet alignment similar compared to prior study. Skull base and vertebrae: Craniovertebral junction is intact. No fracture. Soft tissues and  spinal canal: No prevertebral fluid or swelling. No visible canal hematoma. Disc levels: Multilevel degenerative disc disease, moderate severe its C2-C3 and C3-C4, severe it C4-C5, C5-C6 and C6-C7. Posterior disc osteophyte complex at C3-C4, C5-C6 and C6-C7. Multilevel bilateral facet arthropathy. Multilevel bilateral foraminal stenosis, most notable at C5-C6 and C6-C7. Upper chest: No acute changes Other: None IMPRESSION: 1. No CT evidence for acute intracranial abnormality. Moderate small vessel ischemic changes of the white matter along with atrophy 2. No acute fracture or malalignment of the cervical spine. Multilevel advanced degenerative disc changes. Electronically Signed   By: Donavan Foil M.D.   On: 06/25/2016 20:05   Dg Hip Unilat With Pelvis 2-3 Views Right  Result Date: 06/25/2016 CLINICAL DATA:  Right hip pain and groin pain after fall EXAM: DG HIP (WITH OR WITHOUT PELVIS) 2-3V RIGHT COMPARISON:  Hours 926 FINDINGS: There is an acute, subcapital impacted femoral neck fracture. No dislocation of the right hip joint. Pubic rami appear intact. Slight joint space narrowing of the right hip. Atherosclerotic calcifications are present along the course of the external iliac, common femoral and femoral arteries. IMPRESSION: New impacted right subcapital femoral neck fracture. Electronically Signed   By: Ashley Royalty M.D.   On: 06/25/2016 19:25    ECG & Cardiac Imaging    EKG 06/25/13:  NSR with PVC  Echocardiogram 02/05/11: Study Conclusions - Left ventricle: The cavity size was normal. Wall thickness was increased in a pattern of mild LVH. Systolic function was normal. The estimated ejection fraction was in the range of 55% to 60%. Wall motion was normal; there were no regional wall motion abnormalities. - Mitral valve: Valve area by pressure half-time: 2.22cm^2. - Pulmonary arteries: Systolic pressure was mildly increased. PA peak pressure: 75mm Hg (S). - Pericardium, extracardiac:  There was a large left pleural effusion.   Left and Right Heart Catheterization to eval for MVR 12/2010: 1. Severe mitral regurgitation with relatively normal right heart pressures 2. Normal right heart pressures 3. No significant coronary atherosclerotic disease with very mild coronary  calcification noted 4. Mild atherosclerotic disease involving the distal aorta   The patient will be considered for mitral valve repair through a minimally invasive approach. Will discuss further with family.   TEE 01/13/11 to eval MR: Impression: 1) Severe eccentric MR directed anteriorly with flail segment to the posterior leaflet 2) Mild AV calcification 3) Normal EF 65% 4) Normal RA/RV 5) Mild TR 6) Normal PV 7) Mild mural aortic debris 8) No LAA clot  Assessment & Plan    1. Pre-operative cardiovascular risk assessment: Cardiology is asked to evaluate her cardiac risk for right hip surgery. She has a history of probable combined heart failure before her mitral valve was repaired. Echocardiogram following this procedure showed normal LV function. She has no ischemic disease per heart catheterization in 2012, EKG without signs of ischemia, and no complaints of chest pain. She is not diabetic and she has normal kidney function. This fall does not sound like it was related to an arrhythmia and she denies loss of consciousness. Coumadin has been transitioned to heparin drip, which is now off for surgery this afternoon. She does have a history of PE and paroxysmal Afib. Cardiology recommends restarting anticoagulation as soon as its safe from an orthopedic surgery standpoint.   This is a moderate to high risk study in an 83 yo patient with history of mitral valve repair. According to the Revised cardiac risk index, her risk factors include age, type of surgery, and possible vascular dementia. She has a 5.4% of cardiac death, nonfatal myocardial infarction, and nonfatal cardiac arrest and a 9.1% risk of  myocardial infarction, pulmonary edema, ventricular fibrillation, primary cardiac arrest, and complete heart block.    Signed, Tami Lin Duke, PA-C 06/26/2016, 11:17 AM [phone]  I have personally seen and examined this patient with Doreene Adas, PA-C. I agree with the assessment and plan as outlined above. Desiree. Greenberger is a 83 yo female with history of MV repair, PAF and PE on coumadin, HTN, HLD admitted after fall and hip fracture. Known to have normal LV function by echo several years back. She is followed by DR. Wynonia [name]. She has had no angina, dizziness, near syncope or syncope. No weight gain or LE edema.  My exam shows an elderly female in NAD. CV: RRR with no loud murmurs. Pulm: lungs clear bilaterally. Abd: soft, NT. Ext: no edema.  EKG shows sinus rhythm with PVCs.  Labs reviewed. She has mild anemia but normal renal function Plan: Pre-operative cardiovascular risk assessment: She has no worrisome clinical complaints (no angina, CHF or palpitations, near syncope or syncope). While she is at increased risk for any surgical procedure given advanced age and elevated INR, she is willing to accept this risk to proceed with surgical correction of her hip fracture. She can proceed without further cardiac testing.  We will be available as needed.   Lauree Chandler 06/26/2016 12:43 PM

## 2016-06-26 NOTE — Progress Notes (Signed)
PT Cancellation Note  Patient Details Name: Desiree [name] MRN: [id number] DOB: 1925-02-12   Cancelled Treatment:    Reason Eval/Treat Not Completed: Patient not medically ready--surgery scheduled for today. Will sign off and await orders from orthopedics. Thanks.    Weston Anna, MPT Pager: [phone]

## 2016-06-26 NOTE — Interval H&P Note (Signed)
History and Physical Interval Note:  06/26/2016 5:21 PM  Desiree [name]  has presented today for surgery, with the diagnosis of right hip fracture  The various methods of treatment have been discussed with the patient and family. After consideration of risks, benefits and other options for treatment, the patient has consented to  Procedure(s): CANNULATED HIP PINNING (Right) as a surgical intervention .  The patient's history has been reviewed, patient examined, no change in status, stable for surgery.  I have reviewed the patient's chart and labs.  Questions were answered to the patient's satisfaction.     Gearlean Alf

## 2016-06-26 NOTE — Progress Notes (Signed)
[place] for heparin Indication: atrial fibrillation and pulmonary embolus  Allergies  Allergen Reactions  . Celebrex [Celecoxib] Other (See Comments)    Reaction=anemia/leukopenia  . Diovan [Valsartan] Other (See Comments)    Reaction=increase potassium/acute renal failure    Patient Measurements: Height: 5\' 2"  (157.5 cm) Weight: 109 lb 5.6 oz (49.6 kg) IBW/kg (Calculated) : 50.1 Heparin Dosing Weight: 52.7kg  Vital Signs: Temp: 97.7 F (36.5 C) (05/04 0608) Temp Source: Axillary (05/04 0608) BP: 120/44 (05/04 0608) Pulse Rate: 81 (05/04 0608)  Labs:  Recent Labs  06/25/16 1829 06/25/16 1842 06/25/16 2115 06/26/16 0910  HGB 10.9* 10.9*  --   --   HCT 33.7* 32.0*  --   --   PLT 142*  --   --   --   APTT  --   --  37*  --   LABPROT  --   --  23.0*  --   INR  --   --  2.00  --   HEPARINUNFRC  --   --   --  0.24*  CREATININE 0.74 0.70  --   --   CKTOTAL  --   --  63  --     Estimated Creatinine Clearance: 37.3 mL/min (by C-G formula based on SCr of 0.7 mg/dL).   Medical History: Past Medical History:  Diagnosis Date  . Anemia   . Cancer ([place])    skin cancer removed from top of head.per pt  . Carotid artery disease ([place])   . CHF (congestive heart failure), NYHA class II (HCC)    has sudden onset decom CHF? 2/2 to PNA  . Diverticulosis    chronic issues with consitpation and diarrhea  . Heart murmur   . Hypercholesterolemia   . Hypertension   . Hypertensive heart disease without CHF   . Hypothyroidism   . Irregular heart beat   . Lumbar disc disease   . Lumbar disc disease   . Mitral regurgitation   . Osteoarthritis    chronic-on multiple pain meds.  . Paroxysmal atrial fibrillation (HCC)    not on coumadin-Cards=[facility]-saw him ?1 yr ago  . Pneumonia    few times  . Urinary tract infection    hx of UTI    Medications:  Infusions:  . acetaminophen    .  ceFAZolin (ANCEF) IV    . heparin 750 Units/hr  (06/26/16 0124)    Assessment: 11 yof presented to the ED after a fall. Planning surgery tomorrow. Pt is on chronic coumadin for history of afib and PE. INR is 2 so will go ahead and start IV heparin conservatively while awaiting surgery. H/H and platelets are slightly low but no bleeding noted.   Today, 06/26/2016: Heparin level subtherapeutic on 750 units/hr Has order to discontinue heparin infusion this morning at 11:00 for hip fracture pinning this evening.  Goal of Therapy:  Heparin level 0.3-0.7 units/ml Monitor platelets by anticoagulation protocol: Yes   Plan:  Do not advance heparin rate at present Stop heparin at 11 am as ordered by orthopedics.  Clayburn Pert, PharmD, BCPS Pager: [phone] 06/26/2016  9:55 AM

## 2016-06-26 NOTE — Anesthesia Postprocedure Evaluation (Addendum)
Anesthesia Post Note  Patient: Desiree [name]  Procedure(s) Performed: Procedure(s) (LRB): CANNULATED HIP PINNING (Right)  Patient location during evaluation: PACU Anesthesia Type: General Level of consciousness: sedated Pain management: pain level controlled Vital Signs Assessment: post-procedure vital signs reviewed and stable Respiratory status: spontaneous breathing and respiratory function stable Cardiovascular status: stable Anesthetic complications: no       Last Vitals:  Vitals:   06/26/16 1955 06/26/16 2011  BP: 135/62 (!) 146/60  Pulse: 78 82  Resp: 17 20  Temp: 36.3 C 36.5 C    Last Pain:  Vitals:   06/26/16 2046  TempSrc:   PainSc: [place]

## 2016-06-26 NOTE — H&P (View-Only) (Signed)
Reason for Consult:  Right hip pain following fall Referring Physician: Ivor Costa  [facility]  Desiree [name] is an 83 y.o. female.  HPI: Desiree [name] is an 83 year old female that sustained injuries during a fall at home in the yard when she was outside with her dog.  It was reported that she was found by neighbors after laying for about two hours.  She was unable to stand and bear weight.  She was transported to the hospital where she was found to have sustained a nondisplaced subcapital right femoral neck fracture.  Dr. Wynelle Link was on call for [facility] and consulted for orthopedic care.  History obtained from family which states that the Desiree [name] has been quite unsteady for some time and has had multiple falls in the past.  Her daughter is the Desiree [name]'s POA and wishes to proceed with surgery.  Risks and benefits have been discussed with the family and Desiree [name] will be scheduled for later this afternoon.  Past Medical History:  Diagnosis Date  . Anemia   . Cancer ([place])    skin cancer removed from top of head.per pt  . Carotid artery disease ([place])   . CHF (congestive heart failure), NYHA class II (HCC)    has sudden onset decom CHF? 2/2 to PNA  . Diverticulosis    chronic issues with consitpation and diarrhea  . Heart murmur   . Hypercholesterolemia   . Hypertension   . Hypertensive heart disease without CHF   . Hypothyroidism   . Irregular heart beat   . Lumbar disc disease   . Lumbar disc disease   . Mitral regurgitation   . Osteoarthritis    chronic-on multiple pain meds.  . Paroxysmal atrial fibrillation (HCC)    not on coumadin-Cards=[place]-saw him ?1 yr ago  . Pneumonia    few times  . Urinary tract infection    hx of UTI    Past Surgical History:  Procedure Laterality Date  . ABDOMINAL HYSTERECTOMY    . CARDIAC CATHETERIZATION  01/14/11  . CAROTID ENDARTERECTOMY Right 10-09-03   with resection of redundant CCA  . CARPAL TUNNEL RELEASE     rt  .  CATARACT EXTRACTION     bil  . CHEST TUBE INSERTION  02/05/2011   Procedure: INSERTION PLEURAL DRAINAGE CATHETER;  Surgeon: Rexene Alberts, MD;  Location: [place];  Service: Thoracic;  Laterality: Right;  . EYE SURGERY    . FRACTURE SURGERY  Aug. 2013   Right wrist   . KNEE SURGERY     bilateral arthroscopy  . LEFT AND RIGHT HEART CATHETERIZATION WITH CORONARY ANGIOGRAM N/A 01/14/2011   Procedure: LEFT AND RIGHT HEART CATHETERIZATION WITH CORONARY ANGIOGRAM;  Surgeon: Jacolyn Reedy, MD;  Location: [facility] CATH LAB;  Service: Cardiovascular;  Laterality: N/A;  . MITRAL VALVE REPAIR  01/21/2011   Procedure: MITRAL VALVE REPAIR (MVR);  Surgeon: Rexene Alberts, MD;  Location: [place];  Service: Open Heart Surgery;  Laterality: N/A;  . REMOVAL OF PLEURAL DRAINAGE CATHETER  04/09/2011   Procedure: REMOVAL OF PLEURAL DRAINAGE CATHETER;  Surgeon: Rexene Alberts, MD;  Location: [place];  Service: Thoracic;  Laterality: Right;  . ROTATOR CUFF REPAIR     2 on right, 1 on left  . TALC PLEURODESIS  04/09/2011   Procedure: Pietro Cassis;  Surgeon: Rexene Alberts, MD;  Location: [place];  Service: Thoracic;  Laterality: Right;  . TEE WITHOUT CARDIOVERSION  01/13/2011   Procedure: TRANSESOPHAGEAL ECHOCARDIOGRAM (  TEE);  Surgeon: Josue Hector, MD;  Location: [facility] ENDOSCOPY;  Service: Cardiovascular;  Laterality: Left;    Family History  Problem Relation Age of Onset  . Cancer Mother   . Stroke Father   . Cancer Father   . Diabetes Sister   . Heart disease Sister     Heart Disease before age 53  . Heart disease Sister   . Cancer Brother     Social History:  reports that she has never smoked. She has never used smokeless tobacco. She reports that she does not drink alcohol or use drugs.  Allergies:  Allergies  Allergen Reactions  . Celebrex [Celecoxib] Other (See Comments)    Reaction=anemia/leukopenia  . Diovan [Valsartan] Other (See Comments)    Reaction=increase potassium/acute renal failure     Medications: I have reviewed the Desiree [name]'s current medications.  atorvastatin (LIPITOR) 40 MG tablet Take 40 mg by mouth daily at 6 PM. Ivor Costa, MD Reordered    cholecalciferol (VITAMIN D) 1000 UNITS tablet Take 1,000 Units by mouth daily. Ivor Costa, MD Reordered    gabapentin (NEURONTIN) 100 MG capsule Take 4 capsules at night Ivor Costa, MD Reordered   Desiree [name] taking differently: Take 300 mg by mouth at bedtime.       HYDROcodone-acetaminophen (NORCO/VICODIN) 5-325 MG tablet Take 2 tablets by mouth every 6 (six) hours as needed for moderate pain.  Ivor Costa, MD Reordered    levothyroxine (SYNTHROID, LEVOTHROID) 75 MCG tablet Take 75 mcg by mouth daily.  Ivor Costa, MD Reordered    metoprolol tartrate (LOPRESSOR) 25 MG tablet Take 25 mg by mouth 2 (two) times daily.  Ivor Costa, MD Reordered    polyethylene glycol [facility] / GLYCOLAX) packet Take 17 g by mouth daily as needed for mild constipation.  Ivor Costa, MD Reordered    TOVIAZ 8 MG TB24 tablet Take 8 mg by mouth every evening.  Ivor Costa, MD Reordered    warfarin (COUMADIN) 2.5 MG tablet Take 2.5 mg by mouth daily at 6 PM. Ivor Costa, MD Not Ordered   Coumadin ON HOLD for surgery.  Results for orders placed or performed during the hospital encounter of 06/25/16 (from the past 48 hour(s))  CBC with Differential     Status: Abnormal   Collection Time: 06/25/16  6:29 PM  Result Value Ref Range   WBC 10.2 4.0 - 10.5 K/uL   RBC 3.56 (L) 3.87 - 5.11 MIL/uL   Hemoglobin 10.9 (L) 12.0 - 15.0 g/dL   HCT 33.7 (L) 36.0 - 46.0 %   MCV 94.7 78.0 - 100.0 fL   MCH 30.6 26.0 - 34.0 pg   MCHC 32.3 30.0 - 36.0 g/dL   RDW 14.2 11.5 - 15.5 %   Platelets 142 (L) 150 - 400 K/uL   Neutrophils Relative % 87 %   Neutro Abs 8.8 (H) 1.7 - 7.7 K/uL   Lymphocytes Relative 8 %   Lymphs Abs 0.8 0.7 - 4.0 K/uL   Monocytes Relative 4 %   Monocytes Absolute 0.5 0.1 - 1.0 K/uL   Eosinophils Relative 1 %   Eosinophils Absolute 0.1 0.0 - 0.7 K/uL    Basophils Relative 0 %   Basophils Absolute 0.0 0.0 - 0.1 K/uL  Basic metabolic panel     Status: Abnormal   Collection Time: 06/25/16  6:29 PM  Result Value Ref Range   Sodium 137 135 - 145 mmol/L   Potassium 3.8 3.5 - 5.1 mmol/L   Chloride 104 101 -  111 mmol/L   CO2 25 22 - 32 mmol/L   Glucose, Bld 115 (H) 65 - 99 mg/dL   BUN 26 (H) 6 - 20 mg/dL   Creatinine, Ser 0.74 0.44 - 1.00 mg/dL   Calcium 9.2 8.9 - 10.3 mg/dL   GFR calc non Af Amer >60 >60 mL/min   GFR calc Af Amer >60 >60 mL/min    Comment: (NOTE) The eGFR has been calculated using the CKD EPI equation. This calculation has not been validated in all clinical situations. eGFR's persistently <60 mL/min signify possible Chronic Kidney Disease.    Anion gap 8 5 - 15  I-stat troponin, ED     Status: None   Collection Time: 06/25/16  6:40 PM  Result Value Ref Range   Troponin i, poc 0.01 0.00 - 0.08 ng/mL   Comment 3            Comment: Due to the release kinetics of cTnI, a negative result within the first hours of the onset of symptoms does not rule out myocardial infarction with certainty. If myocardial infarction is still suspected, repeat the test at appropriate intervals.   I-Stat Chem 8, ED     Status: Abnormal   Collection Time: 06/25/16  6:42 PM  Result Value Ref Range   Sodium 139 135 - 145 mmol/L   Potassium 3.8 3.5 - 5.1 mmol/L   Chloride 102 101 - 111 mmol/L   BUN 26 (H) 6 - 20 mg/dL   Creatinine, Ser 0.70 0.44 - 1.00 mg/dL   Glucose, Bld 110 (H) 65 - 99 mg/dL   Calcium, Ion 1.18 1.15 - 1.40 mmol/L   TCO2 26 0 - 100 mmol/L   Hemoglobin 10.9 (L) 12.0 - 15.0 g/dL   HCT 32.0 (L) 36.0 - 46.0 %  CK     Status: None   Collection Time: 06/25/16  9:15 PM  Result Value Ref Range   Total CK 63 38 - 234 U/L  Brain natriuretic peptide     Status: Abnormal   Collection Time: 06/25/16  9:15 PM  Result Value Ref Range   B Natriuretic Peptide 166.0 (H) 0.0 - 100.0 pg/mL  APTT     Status: Abnormal   Collection  Time: 06/25/16  9:15 PM  Result Value Ref Range   aPTT 37 (H) 24 - 36 seconds    Comment:        IF BASELINE aPTT IS ELEVATED, SUGGEST Desiree [name] RISK ASSESSMENT BE USED TO DETERMINE APPROPRIATE ANTICOAGULANT THERAPY.   Protime-INR     Status: Abnormal   Collection Time: 06/25/16  9:15 PM  Result Value Ref Range   Prothrombin Time 23.0 (H) 11.4 - 15.2 seconds   INR 2.00   Type and screen [facility]     Status: None   Collection Time: 06/25/16 10:06 PM  Result Value Ref Range   ABO/RH(D) O POS    Antibody Screen NEG    Sample Expiration 06/28/2016   Surgical pcr screen     Status: None   Collection Time: 06/26/16  3:18 AM  Result Value Ref Range   MRSA, PCR NEGATIVE NEGATIVE   Staphylococcus aureus NEGATIVE NEGATIVE    Comment:        The Xpert SA Assay (FDA approved for NASAL specimens in patients over 21 years of age), is one component of a comprehensive surveillance program.  Test performance has been validated by [facility] for patients greater than or equal to 1 year old.   It is not intended to diagnose infection nor to guide or monitor treatment.   TSH     Status: None   Collection Time: 06/26/16  5:25 AM  Result Value Ref Range   TSH 1.183 0.350 - 4.500 uIU/mL    Comment: Performed by a 3rd Generation assay with a functional sensitivity of <=0.01 uIU/mL.  Type and screen [facility]     Status: None   Collection Time: 06/26/16  5:25 AM  Result Value Ref Range   ABO/RH(D) O POS    Antibody Screen NEG    Sample Expiration 06/29/2016   ABO/Rh     Status: None   Collection Time: 06/26/16  5:25 AM  Result Value Ref Range   ABO/RH(D) O POS     Dg Chest 2 View  Result Date: 06/25/2016 CLINICAL DATA:  Desiree [name] fell today.  Pain. EXAM: CHEST  2 VIEW COMPARISON:  None. FINDINGS: The heart size and mediastinal contours are within normal limits. Heart is enlarged. The Desiree [name] is status post median sternotomy. There is aortic  atherosclerosis without aneurysm. Lungs are free of pneumonic consolidation, effusion and pneumothorax. Old posttraumatic deformity of the superolateral humeral head is again seen possibly from prior shoulder dislocation. No acute fracture of the bony thorax. Stable degenerative changes are seen along the dorsal spine. IMPRESSION: No active cardiopulmonary disease. Aortic atherosclerosis. No acute osseous appearing abnormality. Electronically Signed   By: David  Kwon M.D.   On: 06/25/2016 19:23   Ct Head Wo Contrast  Result Date: 06/25/2016 CLINICAL DATA:  Fall injury neck and back pain with dizziness EXAM: CT HEAD WITHOUT CONTRAST CT CERVICAL SPINE WITHOUT CONTRAST TECHNIQUE: Multidetector CT imaging of the head and cervical spine was performed following the standard protocol without intravenous contrast. Multiplanar CT image reconstructions of the cervical spine were also generated. COMPARISON:  11/21/2014, 03/13/2016 FINDINGS: CT HEAD FINDINGS Brain: No acute territorial infarction, hemorrhage or intracranial mass. Moderate periventricular white matter small vessel ischemic changes. Atrophy. Stable ventricle size. Vascular: No hyperdense vessels. Carotid artery and vertebral artery calcification. Skull: Normal. Negative for fracture or focal lesion. Sinuses/Orbits: Mucosal thickening in the ethmoid sinuses. No acute orbital abnormality. Other: None CT CERVICAL SPINE FINDINGS Alignment: Stable alignment with mild anterolisthesis of C7 on T1. Facet alignment similar compared to prior study. Skull base and vertebrae: Craniovertebral junction is intact. No fracture. Soft tissues and spinal canal: No prevertebral fluid or swelling. No visible canal hematoma. Disc levels: Multilevel degenerative disc disease, moderate severe its C2-C3 and C3-C4, severe it C4-C5, C5-C6 and C6-C7. Posterior disc osteophyte complex at C3-C4, C5-C6 and C6-C7. Multilevel bilateral facet arthropathy. Multilevel bilateral foraminal  stenosis, most notable at C5-C6 and C6-C7. Upper chest: No acute changes Other: None IMPRESSION: 1. No CT evidence for acute intracranial abnormality. Moderate small vessel ischemic changes of the white matter along with atrophy 2. No acute fracture or malalignment of the cervical spine. Multilevel advanced degenerative disc changes. Electronically Signed   By: Kim  Fujinaga M.D.   On: 06/25/2016 20:05   Ct Cervical Spine Wo Contrast  Result Date: 06/25/2016 CLINICAL DATA:  Fall injury neck and back pain with dizziness EXAM: CT HEAD WITHOUT CONTRAST CT CERVICAL SPINE WITHOUT CONTRAST TECHNIQUE: Multidetector CT imaging of the head and cervical spine was performed following the standard protocol without intravenous contrast. Multiplanar CT image reconstructions of the cervical spine were also generated. COMPARISON:  11/21/2014, 03/13/2016 FINDINGS: CT HEAD FINDINGS Brain: No acute territorial infarction, hemorrhage or intracranial mass. Moderate periventricular   white matter small vessel ischemic changes. Atrophy. Stable ventricle size. Vascular: No hyperdense vessels. Carotid artery and vertebral artery calcification. Skull: Normal. Negative for fracture or focal lesion. Sinuses/Orbits: Mucosal thickening in the ethmoid sinuses. No acute orbital abnormality. Other: None CT CERVICAL SPINE FINDINGS Alignment: Stable alignment with mild anterolisthesis of C7 on T1. Facet alignment similar compared to prior study. Skull base and vertebrae: Craniovertebral junction is intact. No fracture. Soft tissues and spinal canal: No prevertebral fluid or swelling. No visible canal hematoma. Disc levels: Multilevel degenerative disc disease, moderate severe its C2-C3 and C3-C4, severe it C4-C5, C5-C6 and C6-C7. Posterior disc osteophyte complex at C3-C4, C5-C6 and C6-C7. Multilevel bilateral facet arthropathy. Multilevel bilateral foraminal stenosis, most notable at C5-C6 and C6-C7. Upper chest: No acute changes Other: None  IMPRESSION: 1. No CT evidence for acute intracranial abnormality. Moderate small vessel ischemic changes of the white matter along with atrophy 2. No acute fracture or malalignment of the cervical spine. Multilevel advanced degenerative disc changes. Electronically Signed   By: Kim  Fujinaga M.D.   On: 06/25/2016 20:05   Dg Hip Unilat With Pelvis 2-3 Views Right  Result Date: 06/25/2016 CLINICAL DATA:  Right hip pain and groin pain after fall EXAM: DG HIP (WITH OR WITHOUT PELVIS) 2-3V RIGHT COMPARISON:  Hours 926 FINDINGS: There is an acute, subcapital impacted femoral neck fracture. No dislocation of the right hip joint. Pubic rami appear intact. Slight joint space narrowing of the right hip. Atherosclerotic calcifications are present along the course of the external iliac, common femoral and femoral arteries. IMPRESSION: New impacted right subcapital femoral neck fracture. Electronically Signed   By: David  Kwon M.D.   On: 06/25/2016 19:25    ROS Blood pressure (!) 120/44, pulse 81, temperature 97.7 F (36.5 C), temperature source Axillary, resp. rate 20, height 5' 2" (1.575 m), weight 49.6 kg (109 lb 5.6 oz), SpO2 95 %. Physical Exam  Desiree [name] is an 83 year old female.  Accompanied by family.  Desiree [name] resting comfortably.  History taken from daughter. Right lower extremity - pulses intact, sensation intact.  Radiographs - New impacted right subcapital femoral neck fracture.  Assessment/Plan: Right Subcapital Femoral Neck Fracture  Desiree [name] seen in consultation by Dr. Aluisio this morning.  Family in room.  Fall at home in which she sustained a right femoral neck fracture that will require hip pinning.  Procedure discussed with family and wish to proceed.  Will allow liquid breakfast this morning and then NPO after ( AM today.  Tentative plan for surgery this afternoon.  Medicine noted possible need for cardiology consult.  Will defer to their recommendation on wether or not to consult  cards.  Coumadin on hold at this time due to planned procedure. Currently on Heparin - WILL NEED TO STOP AT LEAST 6 HOURS PRIOR TO SURGERY. Planned surgery for around 5 or 6 PM later today.  Attached note to Heparin order to stop today at 11 AM. Consent - Right hi pinning NPO after 9 AM ANCEF on call to OR  ALEXZANDREW PERKINS 06/26/2016, 7:57 AM     

## 2016-06-26 NOTE — Transfer of Care (Signed)
Immediate Anesthesia Transfer of Care Note  Patient: Desiree [name]  Procedure(s) Performed: Procedure(s): CANNULATED HIP PINNING (Right)  Patient Location: PACU  Anesthesia Type:General  Level of Consciousness:  sedated, patient cooperative and responds to stimulation  Airway & Oxygen Therapy:Patient Spontanous Breathing and Patient connected to face mask oxgen  Post-op Assessment:  Report given to PACU RN and Post -op Vital signs reviewed and stable  Post vital signs:  Reviewed and stable  Last Vitals:  Vitals:   06/26/16 0608 06/26/16 1554  BP: (!) 120/44 (!) 143/70  Pulse: 81 86  Resp: 20 18  Temp: 36.5 C 78.6 C    Complications: No apparent anesthesia complications

## 2016-06-26 NOTE — Progress Notes (Signed)
[facility] PROGRESS NOTE  Desiree [name] MEB:[id number] DOB: 1924/07/19 DOA: 06/25/2016 PCP: London Pepper, MD  Interim summary and history of present illness: 83 year old female with past medical history significant for hypertension, hyperlipidemia, hypothyroidism, pulmonary embolism/paroxysmal atrial fibrillation (on chronic Coumadin), diastolic heart failure and chronic back pain; who presented to the emergency department secondary to right hip pain and inability for weightbearing after mechanical fall.   Patient found with to have right femoral neck fracture. Orthopedic on board and looking to performed surgery (06/26/16)  Assessment/Plan:  1-Right hip pain/femoral neck fracture - will continue as needed analgesic medication and the use of Robaxin for muscle spasm - patient has not been clear by cardiology for surgical intervention - she is at high risk for surgery but no further workup is needed prior to procedure - orthopedic service he is comfortable and planning to perform surgical repair later today. - Will continue heparin for now.   2-fall and gait instability - physical therapy/occupational therapy once surgical repair completed and clear by orthopedic service regarding weight bearing tolerance.   3-history of pulmonary embolism and paroxysmal atrial fibrillation - currently sinus rhythm. - will continue metoprolol - patient is on Coumadin at home; currently patient with hip pain prior to surgical intervention - resume anticoagulation once okay by orthopedic service. - No chest pain or shortness of breath. - CHADsVASC score 6  4- chronic diastolic heart failure: - appears to be compensated - will follow daily weights and strict intake and output - continue beta blocker - after surgery is completed we'll resume daily Lasix.   5-chronic back pain secondary to lumbar disc disease  will continue pain medicationas needed  6- hypothyroidism - will continue Synthroid -  TSH within normal limits (1.183)   7-hyperlipidemia -Will continue statins  Code Status: DNR Family Communication: son at bedside  Disposition Plan:  To be determine. Patient will be taking for hip surgery later this afternoon. She is at high risk for any kind of surgical intervention;  But stable and without any  Acute worrisome symptoms. Has been cleared by cardiology,  Without the need of any further cardiac testing prior to procedure.    Consultants:  Orthopedic service  Cardiology   Procedures:   see below for x-ray reports  Antibiotics:   None  HPI/Subjective: Afebrile, no chest pain, no shortness of breath, no palpitations.  Patient reports pain on her right hip (which is controlled with current analgesics and no movement).  Objective: Vitals:   06/25/16 2351 06/26/16 0608  BP: (!) 134/57 (!) 120/44  Pulse: 89 81  Resp: (!) 21 20  Temp: 99 F (37.2 C) 97.7 F (36.5 C)    Intake/Output Summary (Last 24 hours) at 06/26/16 1440 Last data filed at 06/26/16 1300  Gross per 24 hour  Intake            124.5 ml  Output                0 ml  Net            124.5 ml   Filed Weights   06/25/16 2351  Weight: 49.6 kg (109 lb 5.6 oz)    Exam:   General:   Afebrile, denies chest pain and shortness of breath.  Patient is pleasant, currently oriented 3 and in no acute distress.  Cardiovascular:  Regular rate, no rubs, no gallops,  No JVD appreciated.  Respiratory:  Good air movement;  No wheezing, no frank crackles, no use  of accessory muscles  Abdomen:  Soft, nontender, positive bowel sounds,  No guarding or distention appreciated on exam.  Musculoskeletal:  Right hip pain with any movement and on palpation.  No cyanosis or clubbing. No edema appreciated on her legs.  Data Reviewed: Basic Metabolic Panel:  Recent Labs Lab 06/25/16 1829 06/25/16 1842  NA 137 139  K 3.8 3.8  CL 104 102  CO2 25  --   GLUCOSE 115* 110*  BUN 26* 26*  CREATININE 0.74 0.70   CALCIUM 9.2  --    CBC:  Recent Labs Lab 06/25/16 1829 06/25/16 1842  WBC 10.2  --   NEUTROABS 8.8*  --   HGB 10.9* 10.9*  HCT 33.7* 32.0*  MCV 94.7  --   PLT 142*  --    Cardiac Enzymes:  Recent Labs Lab 06/25/16 2115  CKTOTAL 63   BNP (last 3 results)  Recent Labs  06/25/16 2115  BNP 166.0*   CBG: No results for input(s): GLUCAP in the last 168 hours.  Recent Results (from the past 240 hour(s))  Surgical pcr screen     Status: None   Collection Time: 06/26/16  3:18 AM  Result Value Ref Range Status   MRSA, PCR NEGATIVE NEGATIVE Final   Staphylococcus aureus NEGATIVE NEGATIVE Final    Comment:        The Xpert SA Assay (FDA approved for NASAL specimens in patients over 2 years of age), is one component of a comprehensive surveillance program.  Test performance has been validated by [facility] for patients greater than or equal to 90 year old. It is not intended to diagnose infection nor to guide or monitor treatment.      Studies: Dg Chest 2 View  Result Date: 06/25/2016 CLINICAL DATA:  Patient fell today.  Pain. EXAM: CHEST  2 VIEW COMPARISON:  None. FINDINGS: The heart size and mediastinal contours are within normal limits. Heart is enlarged. The patient is status post median sternotomy. There is aortic atherosclerosis without aneurysm. Lungs are free of pneumonic consolidation, effusion and pneumothorax. Old posttraumatic deformity of the superolateral humeral head is again seen possibly from prior shoulder dislocation. No acute fracture of the bony thorax. Stable degenerative changes are seen along the dorsal spine. IMPRESSION: No active cardiopulmonary disease. Aortic atherosclerosis. No acute osseous appearing abnormality. Electronically Signed   By: Ashley Royalty M.D.   On: 06/25/2016 19:23   Ct Head Wo Contrast  Result Date: 06/25/2016 CLINICAL DATA:  Fall injury neck and back pain with dizziness EXAM: CT HEAD WITHOUT CONTRAST CT CERVICAL SPINE  WITHOUT CONTRAST TECHNIQUE: Multidetector CT imaging of the head and cervical spine was performed following the standard protocol without intravenous contrast. Multiplanar CT image reconstructions of the cervical spine were also generated. COMPARISON:  11/21/2014, 03/13/2016 FINDINGS: CT HEAD FINDINGS Brain: No acute territorial infarction, hemorrhage or intracranial mass. Moderate periventricular white matter small vessel ischemic changes. Atrophy. Stable ventricle size. Vascular: No hyperdense vessels. Carotid artery and vertebral artery calcification. Skull: Normal. Negative for fracture or focal lesion. Sinuses/Orbits: Mucosal thickening in the ethmoid sinuses. No acute orbital abnormality. Other: None CT CERVICAL SPINE FINDINGS Alignment: Stable alignment with mild anterolisthesis of C7 on T1. Facet alignment similar compared to prior study. Skull base and vertebrae: Craniovertebral junction is intact. No fracture. Soft tissues and spinal canal: No prevertebral fluid or swelling. No visible canal hematoma. Disc levels: Multilevel degenerative disc disease, moderate severe its C2-C3 and C3-C4, severe it C4-C5, C5-C6 and C6-C7. Posterior  disc osteophyte complex at C3-C4, C5-C6 and C6-C7. Multilevel bilateral facet arthropathy. Multilevel bilateral foraminal stenosis, most notable at C5-C6 and C6-C7. Upper chest: No acute changes Other: None IMPRESSION: 1. No CT evidence for acute intracranial abnormality. Moderate small vessel ischemic changes of the white matter along with atrophy 2. No acute fracture or malalignment of the cervical spine. Multilevel advanced degenerative disc changes. Electronically Signed   By: Donavan Foil M.D.   On: 06/25/2016 20:05   Ct Cervical Spine Wo Contrast  Result Date: 06/25/2016 CLINICAL DATA:  Fall injury neck and back pain with dizziness EXAM: CT HEAD WITHOUT CONTRAST CT CERVICAL SPINE WITHOUT CONTRAST TECHNIQUE: Multidetector CT imaging of the head and cervical spine was  performed following the standard protocol without intravenous contrast. Multiplanar CT image reconstructions of the cervical spine were also generated. COMPARISON:  11/21/2014, 03/13/2016 FINDINGS: CT HEAD FINDINGS Brain: No acute territorial infarction, hemorrhage or intracranial mass. Moderate periventricular white matter small vessel ischemic changes. Atrophy. Stable ventricle size. Vascular: No hyperdense vessels. Carotid artery and vertebral artery calcification. Skull: Normal. Negative for fracture or focal lesion. Sinuses/Orbits: Mucosal thickening in the ethmoid sinuses. No acute orbital abnormality. Other: None CT CERVICAL SPINE FINDINGS Alignment: Stable alignment with mild anterolisthesis of C7 on T1. Facet alignment similar compared to prior study. Skull base and vertebrae: Craniovertebral junction is intact. No fracture. Soft tissues and spinal canal: No prevertebral fluid or swelling. No visible canal hematoma. Disc levels: Multilevel degenerative disc disease, moderate severe its C2-C3 and C3-C4, severe it C4-C5, C5-C6 and C6-C7. Posterior disc osteophyte complex at C3-C4, C5-C6 and C6-C7. Multilevel bilateral facet arthropathy. Multilevel bilateral foraminal stenosis, most notable at C5-C6 and C6-C7. Upper chest: No acute changes Other: None IMPRESSION: 1. No CT evidence for acute intracranial abnormality. Moderate small vessel ischemic changes of the white matter along with atrophy 2. No acute fracture or malalignment of the cervical spine. Multilevel advanced degenerative disc changes. Electronically Signed   By: Donavan Foil M.D.   On: 06/25/2016 20:05   Dg Hip Unilat With Pelvis 2-3 Views Right  Result Date: 06/25/2016 CLINICAL DATA:  Right hip pain and groin pain after fall EXAM: DG HIP (WITH OR WITHOUT PELVIS) 2-3V RIGHT COMPARISON:  Hours 926 FINDINGS: There is an acute, subcapital impacted femoral neck fracture. No dislocation of the right hip joint. Pubic rami appear intact. Slight joint  space narrowing of the right hip. Atherosclerotic calcifications are present along the course of the external iliac, common femoral and femoral arteries. IMPRESSION: New impacted right subcapital femoral neck fracture. Electronically Signed   By: Ashley Royalty M.D.   On: 06/25/2016 19:25    Scheduled Meds: . atorvastatin  40 mg Oral q1800  . cholecalciferol  1,000 Units Oral Daily  . feeding supplement (ENSURE ENLIVE)  237 mL Oral TID BM  . fesoterodine  8 mg Oral Daily  . gabapentin  400 mg Oral QHS  . levothyroxine  75 mcg Oral QAC breakfast  . metoprolol succinate  25 mg Oral Daily  . multivitamin with minerals  1 tablet Oral Daily   Continuous Infusions: . acetaminophen    .  ceFAZolin (ANCEF) IV      Principal Problem:   Fracture of femoral neck, right, closed (HCC) Active Problems:   Lumbar disc disease   Hypothyroidism   Hyperlipidemia   Pulmonary embolus (HCC)   S/P MVR (mitral valve repair)   Gait instability   PAF (paroxysmal atrial fibrillation) (HCC)   Protein-calorie malnutrition, severe  Time spent: 25 minutes    Barton Dubois  [facility] Pager [phone]. If 7PM-7AM, please contact night-coverage at [email or web address], password [facility] 06/26/2016, 2:40 PM  LOS: 1 day

## 2016-06-26 NOTE — Anesthesia Preprocedure Evaluation (Addendum)
Anesthesia Evaluation  Patient identified by MRN, date of birth, ID band Patient awake    Reviewed: Allergy & Precautions, NPO status , Patient's Chart, lab work & pertinent test results  History of Anesthesia Complications Negative for: history of anesthetic complications  Airway Mallampati: II  TM Distance: >3 FB Neck ROM: Full    Dental no notable dental hx. (+) Dental Advisory Given   Pulmonary neg pulmonary ROS,    Pulmonary exam normal        Cardiovascular hypertension, + Peripheral Vascular Disease  Normal cardiovascular exam     Neuro/Psych negative neurological ROS  negative psych ROS   GI/Hepatic negative GI ROS, Neg liver ROS,   Endo/Other  Hypothyroidism   Renal/GU negative Renal ROS     Musculoskeletal  (+) Arthritis ,   Abdominal   Peds negative pediatric ROS (+)  Hematology   Anesthesia Other Findings   Reproductive/Obstetrics                            Anesthesia Physical Anesthesia Plan  ASA: III  Anesthesia Plan: General   Post-op Pain Management:    Induction:   Airway Management Planned: LMA  Additional Equipment:   Intra-op Plan:   Post-operative Plan: Extubation in OR  Informed Consent: I have reviewed the patients History and Physical, chart, labs and discussed the procedure including the risks, benefits and alternatives for the proposed anesthesia with the patient or authorized representative who has indicated his/her understanding and acceptance.   Dental advisory given  Plan Discussed with: CRNA and Anesthesiologist  Anesthesia Plan Comments:        Anesthesia Quick Evaluation

## 2016-06-27 ENCOUNTER — Encounter (HOSPITAL_COMMUNITY): Payer: Self-pay

## 2016-06-27 LAB — CBC
HCT: 31.9 % — ABNORMAL LOW (ref 36.0–46.0)
Hemoglobin: 10.3 g/dL — ABNORMAL LOW (ref 12.0–15.0)
MCH: 30.6 pg (ref 26.0–34.0)
MCHC: 32.3 g/dL (ref 30.0–36.0)
MCV: 94.7 fL (ref 78.0–100.0)
PLATELETS: 114 10*3/uL — AB (ref 150–400)
RBC: 3.37 MIL/uL — ABNORMAL LOW (ref 3.87–5.11)
RDW: 14.2 % (ref 11.5–15.5)
WBC: 4.1 10*3/uL (ref 4.0–10.5)

## 2016-06-27 LAB — PROTIME-INR
INR: 1.99
PROTHROMBIN TIME: 22.9 s — AB (ref 11.4–15.2)

## 2016-06-27 MED ORDER — WARFARIN - PHARMACIST DOSING INPATIENT
Freq: Every day | Status: DC
  Administered 2016-06-28: 16:00:00

## 2016-06-27 MED ORDER — WARFARIN SODIUM 2.5 MG PO TABS
2.5000 mg | ORAL_TABLET | Freq: Once | ORAL | Status: AC
  Administered 2016-06-27: 2.5 mg via ORAL
  Filled 2016-06-27: qty 1

## 2016-06-27 NOTE — Progress Notes (Signed)
[place] for warfarin Indication: atrial fibrillation and pulmonary embolus  Allergies  Allergen Reactions  . Celebrex [Celecoxib] Other (See Comments)    Reaction:  Anemia/leukopenia  . Diovan [Valsartan] Other (See Comments)    Reaction:  Increases potassium levels/acute renal failure     Patient Measurements: Height: 5\' 2"  (157.5 cm) Weight: 109 lb 5.6 oz (49.6 kg) IBW/kg (Calculated) : 50.1 Heparin Dosing Weight: 52.7kg  Vital Signs:    Labs:  Recent Labs  06/25/16 1829 06/25/16 1842 06/25/16 2115 06/26/16 0910 06/26/16 2025 06/27/16 0543  HGB 10.9* 10.9*  --   --  10.3* 10.3*  HCT 33.7* 32.0*  --   --  34.0* 31.9*  PLT 142*  --   --   --  122* 114*  APTT  --   --  37*  --   --   --   LABPROT  --   --  23.0*  --   --  22.9*  INR  --   --  2.00  --   --  1.99  HEPARINUNFRC  --   --   --  0.24*  --   --   CREATININE 0.74 0.70  --   --  0.79  --   CKTOTAL  --   --  63  --   --   --     Estimated Creatinine Clearance: 37.3 mL/min (by C-G formula based on SCr of 0.79 mg/dL).   Medical History: Past Medical History:  Diagnosis Date  . Anemia   . Cancer ([place])    skin cancer removed from top of head.per pt  . Carotid artery disease ([place])   . CHF (congestive heart failure), NYHA class II (HCC)    has sudden onset decom CHF? 2/2 to PNA  . Diverticulosis    chronic issues with consitpation and diarrhea  . Heart murmur   . Hypercholesterolemia   . Hypertension   . Hypertensive heart disease without CHF   . Hypothyroidism   . Irregular heart beat   . Lumbar disc disease   . Lumbar disc disease   . Mitral regurgitation   . Osteoarthritis    chronic-on multiple pain meds.  . Paroxysmal atrial fibrillation (HCC)    not on coumadin-Cards=[place]-saw him ?1 yr ago  . Pneumonia    few times  . Urinary tract infection    hx of UTI    Assessment: 89 yof presented to the ED after a fall now s/p hip fx pinning on 5/4 PM.  Lovenox 40mg  daily started POD1 now to restart warfarin per Rx. Home warfarin dose prior to 2.5mg  qpm.   Today, 06/27/2016:  INR just below goal at 1.99  Goal of Therapy:  Heparin level 0.3-0.7 units/ml Monitor platelets by anticoagulation protocol: Yes   Plan:  1) Warfarin 2.5mg  tonight 2) Continue Lovenox until INR > 2 3) Consideration can be given to increase Lovenox to full dose if bleeding risk low enough post surgery as deemed by ortho 4) daily INR   Adrian Saran, PharmD, BCPS Pager [phone] 06/27/2016 5:27 PM

## 2016-06-27 NOTE — Progress Notes (Addendum)
[facility] PROGRESS NOTE  Desiree [name] HFW:[id number] DOB: [birth year] DOA: 06/25/2016 PCP: London Pepper, MD  Interim summary and history of present illness: 83 year old female with past medical history significant for hypertension, hyperlipidemia, hypothyroidism, pulmonary embolism/paroxysmal atrial fibrillation (on chronic Coumadin), diastolic heart failure and chronic back pain; who presented to the emergency department secondary to right hip pain and inability for weightbearing after mechanical fall.   Patient found with to have right femoral neck fracture. Orthopedic on board and looking to performed surgery (06/26/16)  Assessment/Plan:  1-Right hip pain/femoral neck fracture -status post right hip pinning -tolerated procedure well and w/o major complications  -will continue PRN analgesics -resume coumadin -will follow orthopedic service post-operative rec's    2-fall and gait instability - PT/OT has seen patient and recommended SNF for rehabilitation -SW made aware -will pursuit placement once bed available   3-history of pulmonary embolism and paroxysmal atrial fibrillation - has remained in sinus rhythm and rate is controlled. - continue metoprolol -will ask Pharmacy to resume coumadin  - CHADsVASC score 6 -no CP or SOB -will d/c telemetry   4- chronic diastolic heart failure: - appears to be compensated - will continue daily weights and strict I's and O's -will continue b-blocker and will follow volume status  -continue watching sodium intake    5-chronic back pain secondary to lumbar disc disease  -continue pain meds as needed   6- hypothyroidism - TSH WNL - will continue synthroid    7-hyperlipidemia -continue statins   8-severe protein calorie malnutrition  -will follow rec's from nutritional service and use feeding supplements   Code Status: DNR Family Communication: daughter at bedside  Disposition Plan:  Patient tolerated surgical procedure well.  Per PT/OT evaluation will benefit of SNF at discharge. Will resume coumadin per pharmacy rec's. Will follow post operative instructions from Orthopedic service.   Consultants:  Orthopedic service  Cardiology   Procedures:  See below for x-ray reports  Right hip cannulated pinning 06/26/16  Antibiotics:  Received Ancef for prophylaxis prior to OR visit.  HPI/Subjective: Afebrile, no CP and no SOB. Patient reported mild pain in her hip. No nausea, no vomiting.   Objective: Vitals:   06/26/16 2308 06/27/16 0416  BP: (!) 112/43 (!) 131/55  Pulse: 74 73  Resp: 18 19  Temp: 97.8 F (36.6 C) 97.6 F (36.4 C)    Intake/Output Summary (Last 24 hours) at 06/27/16 1723 Last data filed at 06/27/16 1445  Gross per 24 hour  Intake              100 ml  Output             1525 ml  Net            -1425 ml   Filed Weights   06/25/16 2351  Weight: 49.6 kg (109 lb 5.6 oz)    Exam:   General:   Afebrile, denies chest pain and shortness of breath.  Patient is pleasant and in no acute distress. Reports that her hip pain is mild to moderate (but essentially not present while non-mobile)  Cardiovascular:  Regular rate, no rubs, no gallops, positive soft murmur. No JVD.  Respiratory:  Good air movement, no wheezing, no crackles  Abdomen:  Soft, NT, ND, positive BS  Musculoskeletal:  Right hip pain with movement; no swelling, clean dressings in place.  No cyanosis or clubbing seen.  Data Reviewed: Basic Metabolic Panel:  Recent Labs Lab 06/25/16 1829 06/25/16 1842 06/26/16 2025  NA 137 139  --   K 3.8 3.8  --   CL 104 102  --   CO2 25  --   --   GLUCOSE 115* 110*  --   BUN 26* 26*  --   CREATININE 0.74 0.70 0.79  CALCIUM 9.2  --   --    CBC:  Recent Labs Lab 06/25/16 1829 06/25/16 1842 06/26/16 2025 06/27/16 0543  WBC 10.2  --  7.0 4.1  NEUTROABS 8.8*  --   --   --   HGB 10.9* 10.9* 10.3* 10.3*  HCT 33.7* 32.0* 34.0* 31.9*  MCV 94.7  --  98.0 94.7  PLT 142*   --  122* 114*   Cardiac Enzymes:  Recent Labs Lab 06/25/16 2115  CKTOTAL 63   BNP (last 3 results)  Recent Labs  06/25/16 2115  BNP 166.0*   CBG: No results for input(s): GLUCAP in the last 168 hours.  Recent Results (from the past 240 hour(s))  Surgical pcr screen     Status: None   Collection Time: 06/26/16  3:18 AM  Result Value Ref Range Status   MRSA, PCR NEGATIVE NEGATIVE Final   Staphylococcus aureus NEGATIVE NEGATIVE Final    Comment:        The Xpert SA Assay (FDA approved for NASAL specimens in patients over 39 years of age), is one component of a comprehensive surveillance program.  Test performance has been validated by [facility] for patients greater than or equal to 63 year old. It is not intended to diagnose infection nor to guide or monitor treatment.      Studies: Dg Chest 2 View  Result Date: 06/25/2016 CLINICAL DATA:  Patient fell today.  Pain. EXAM: CHEST  2 VIEW COMPARISON:  None. FINDINGS: The heart size and mediastinal contours are within normal limits. Heart is enlarged. The patient is status post median sternotomy. There is aortic atherosclerosis without aneurysm. Lungs are free of pneumonic consolidation, effusion and pneumothorax. Old posttraumatic deformity of the superolateral humeral head is again seen possibly from prior shoulder dislocation. No acute fracture of the bony thorax. Stable degenerative changes are seen along the dorsal spine. IMPRESSION: No active cardiopulmonary disease. Aortic atherosclerosis. No acute osseous appearing abnormality. Electronically Signed   By: Ashley Royalty M.D.   On: 06/25/2016 19:23   Ct Head Wo Contrast  Result Date: 06/25/2016 CLINICAL DATA:  Fall injury neck and back pain with dizziness EXAM: CT HEAD WITHOUT CONTRAST CT CERVICAL SPINE WITHOUT CONTRAST TECHNIQUE: Multidetector CT imaging of the head and cervical spine was performed following the standard protocol without intravenous contrast. Multiplanar  CT image reconstructions of the cervical spine were also generated. COMPARISON:  11/21/2014, 03/13/2016 FINDINGS: CT HEAD FINDINGS Brain: No acute territorial infarction, hemorrhage or intracranial mass. Moderate periventricular white matter small vessel ischemic changes. Atrophy. Stable ventricle size. Vascular: No hyperdense vessels. Carotid artery and vertebral artery calcification. Skull: Normal. Negative for fracture or focal lesion. Sinuses/Orbits: Mucosal thickening in the ethmoid sinuses. No acute orbital abnormality. Other: None CT CERVICAL SPINE FINDINGS Alignment: Stable alignment with mild anterolisthesis of C7 on T1. Facet alignment similar compared to prior study. Skull base and vertebrae: Craniovertebral junction is intact. No fracture. Soft tissues and spinal canal: No prevertebral fluid or swelling. No visible canal hematoma. Disc levels: Multilevel degenerative disc disease, moderate severe its C2-C3 and C3-C4, severe it C4-C5, C5-C6 and C6-C7. Posterior disc osteophyte complex at C3-C4, C5-C6 and C6-C7. Multilevel bilateral facet arthropathy. Multilevel bilateral foraminal  stenosis, most notable at C5-C6 and C6-C7. Upper chest: No acute changes Other: None IMPRESSION: 1. No CT evidence for acute intracranial abnormality. Moderate small vessel ischemic changes of the white matter along with atrophy 2. No acute fracture or malalignment of the cervical spine. Multilevel advanced degenerative disc changes. Electronically Signed   By: Donavan Foil M.D.   On: 06/25/2016 20:05   Ct Cervical Spine Wo Contrast  Result Date: 06/25/2016 CLINICAL DATA:  Fall injury neck and back pain with dizziness EXAM: CT HEAD WITHOUT CONTRAST CT CERVICAL SPINE WITHOUT CONTRAST TECHNIQUE: Multidetector CT imaging of the head and cervical spine was performed following the standard protocol without intravenous contrast. Multiplanar CT image reconstructions of the cervical spine were also generated. COMPARISON:   11/21/2014, 03/13/2016 FINDINGS: CT HEAD FINDINGS Brain: No acute territorial infarction, hemorrhage or intracranial mass. Moderate periventricular white matter small vessel ischemic changes. Atrophy. Stable ventricle size. Vascular: No hyperdense vessels. Carotid artery and vertebral artery calcification. Skull: Normal. Negative for fracture or focal lesion. Sinuses/Orbits: Mucosal thickening in the ethmoid sinuses. No acute orbital abnormality. Other: None CT CERVICAL SPINE FINDINGS Alignment: Stable alignment with mild anterolisthesis of C7 on T1. Facet alignment similar compared to prior study. Skull base and vertebrae: Craniovertebral junction is intact. No fracture. Soft tissues and spinal canal: No prevertebral fluid or swelling. No visible canal hematoma. Disc levels: Multilevel degenerative disc disease, moderate severe its C2-C3 and C3-C4, severe it C4-C5, C5-C6 and C6-C7. Posterior disc osteophyte complex at C3-C4, C5-C6 and C6-C7. Multilevel bilateral facet arthropathy. Multilevel bilateral foraminal stenosis, most notable at C5-C6 and C6-C7. Upper chest: No acute changes Other: None IMPRESSION: 1. No CT evidence for acute intracranial abnormality. Moderate small vessel ischemic changes of the white matter along with atrophy 2. No acute fracture or malalignment of the cervical spine. Multilevel advanced degenerative disc changes. Electronically Signed   By: Donavan Foil M.D.   On: 06/25/2016 20:05   Dg C-arm 1-60 Min-no Report  Result Date: 06/26/2016 Fluoroscopy was utilized by the requesting physician.  No radiographic interpretation.   Dg Hip Unilat With Pelvis 1v Right  Result Date: 06/26/2016 CLINICAL DATA:  Hip fracture repair. FLUOROSCOPY TIME:  31 seconds. Images: 2 EXAM: DG HIP (WITH OR WITHOUT PELVIS) 1V RIGHT COMPARISON:  None. FINDINGS: Three screws have been placed across the right-sided hip fracture. IMPRESSION: Three screws have been placed across the right-sided hip fracture.  Electronically Signed   By: Dorise Bullion III M.D   On: 06/26/2016 20:37   Dg Hip Unilat With Pelvis 2-3 Views Right  Result Date: 06/25/2016 CLINICAL DATA:  Right hip pain and groin pain after fall EXAM: DG HIP (WITH OR WITHOUT PELVIS) 2-3V RIGHT COMPARISON:  Hours 926 FINDINGS: There is an acute, subcapital impacted femoral neck fracture. No dislocation of the right hip joint. Pubic rami appear intact. Slight joint space narrowing of the right hip. Atherosclerotic calcifications are present along the course of the external iliac, common femoral and femoral arteries. IMPRESSION: New impacted right subcapital femoral neck fracture. Electronically Signed   By: Ashley Royalty M.D.   On: 06/25/2016 19:25    Scheduled Meds: . atorvastatin  40 mg Oral q1800  . cholecalciferol  1,000 Units Oral Daily  . enoxaparin (LOVENOX) injection  40 mg Subcutaneous Q24H  . feeding supplement (ENSURE ENLIVE)  237 mL Oral TID BM  . fesoterodine  8 mg Oral Daily  . gabapentin  400 mg Oral QHS  . levothyroxine  75 mcg Oral QAC breakfast  .  metoprolol succinate  25 mg Oral Daily  . multivitamin with minerals  1 tablet Oral Daily   Continuous Infusions:   Principal Problem:   Fracture of femoral neck, right, closed (HCC) Active Problems:   Lumbar disc disease   Hypothyroidism   Hyperlipidemia   Pulmonary embolus (HCC)   S/P MVR (mitral valve repair)   Gait instability   PAF (paroxysmal atrial fibrillation) (HCC)   Protein-calorie malnutrition, severe    Time spent: 25 minutes    Barton Dubois  [facility] Pager [phone]. If 7PM-7AM, please contact night-coverage at [email or web address], password [facility] 06/27/2016, 5:23 PM  LOS: 2 days
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
## 2016-06-27 NOTE — Progress Notes (Signed)
     Subjective: 1 Day Post-Op Procedure(s) (LRB): CANNULATED HIP PINNING (Right)   Patient reports pain as mild, almost no pain while sitting still.  She does state it increases some with movement of the right hip.  She feels that she is doing well.  Ready to work with PT.  Planning on SNF upon discharge.  Objective:   VITALS:   Vitals:   06/26/16 2308 06/27/16 0416  BP: (!) 112/43 (!) 131/55  Pulse: 74 73  Resp: 18 19  Temp: 97.8 F (36.6 C) 97.6 F (36.4 C)    Dorsiflexion/Plantar flexion intact Incision: dressing C/D/I No cellulitis present Compartment soft  LABS  Recent Labs  06/25/16 1829 06/25/16 1842 06/26/16 2025 06/27/16 0543  HGB 10.9* 10.9* 10.3* 10.3*  HCT 33.7* 32.0* 34.0* 31.9*  WBC 10.2  --  7.0 4.1  PLT 142*  --  122* 114*     Recent Labs  06/25/16 1829 06/25/16 1842 06/26/16 2025  NA 137 139  --   K 3.8 3.8  --   BUN 26* 26*  --   CREATININE 0.74 0.70 0.79  GLUCOSE 115* 110*  --      Assessment/Plan: 1 Day Post-Op Procedure(s) (LRB): CANNULATED HIP PINNING (Right) Advance diet Up with therapy Discharge to SNF eventually, when ready    West Pugh. Annitta Fifield   PAC  06/27/2016, 8:51 AM

## 2016-06-27 NOTE — Evaluation (Signed)
Occupational Therapy Evaluation Patient Details Name: Desiree [name] MRN: [id number] DOB: 1925-01-26 Today's Date: 06/27/2016    History of Present Illness 83 yo female s/p fall in which she broke R hip.  Pt had R CANNULATED HIP PINNING 06/26/16   Clinical Impression   Pt is s/p R hip pinning resulting in the deficits listed below (see OT Problem List). Pt will benefit from skilled OT to increase their safety and independence with ADL and functional mobility for ADL to facilitate discharge to venue listed below.      Follow Up Recommendations  SNF;Home health OT;Supervision/Assistance - 24 hour;Other (comment) (home only if 24/7 A inplace)    Equipment Recommendations  None recommended by OT       Precautions / Restrictions Restrictions Weight Bearing Restrictions: Yes RLE Weight Bearing: Weight bearing as tolerated      Mobility Bed Mobility Overal bed mobility: Needs Assistance Bed Mobility: Supine to Sit     Supine to sit: Mod assist        Transfers Overall transfer level: Needs assistance Equipment used: Rolling walker (2 wheeled) Transfers: Sit to/from [facility] Sit to Stand: Mod assist;+2 physical assistance;+2 safety/equipment Stand pivot transfers: Mod assist;+2 physical assistance;+2 safety/equipment       General transfer comment: VC for technique        ADL either performed or assessed with clinical judgement   ADL Overall ADL's : Needs assistance/impaired     Grooming: Sitting;Set up           Upper Body Dressing : Minimal assistance;Sitting   Lower Body Dressing: Maximal assistance;Sit to/from stand;Cueing for safety;Cueing for compensatory techniques;Cueing for sequencing   Toilet Transfer: Moderate assistance;+2 for physical assistance;Stand-pivot;RW;Cueing for sequencing;Cueing for safety;BSC   Toileting- Clothing Manipulation and Hygiene: Moderate assistance;Sit to/from stand;Cueing for sequencing;Cueing for  compensatory techniques;Cueing for safety;+2 for safety/equipment                            Pertinent Vitals/Pain Pain Assessment: 0-10 Pain Score: 4  Pain Descriptors / Indicators: Sore Pain Intervention(s): Monitored during session;Repositioned;Premedicated before session     Hand Dominance     Extremity/Trunk Assessment Upper Extremity Assessment Upper Extremity Assessment: Generalized weakness           Communication Communication Communication: No difficulties   Cognition Arousal/Alertness: Awake/alert Behavior During Therapy: WFL for tasks assessed/performed Overall Cognitive Status: Within Functional Limits for tasks assessed                                                Home Living Family/patient expects to be discharged to:: Private residence Living Arrangements: Alone Available Help at Discharge: Family Type of Home: House       Home Layout: One level     Bathroom Shower/Tub: Walk-in Psychologist, prison and probation services: Handicapped height     Home Equipment: Environmental consultant - 2 wheels;Grab bars - tub/shower;Grab bars - toilet          Prior Functioning/Environment Level of Independence: Independent                 OT Problem List: Decreased strength;Decreased activity tolerance;Pain;Decreased knowledge of use of DME or AE;Decreased safety awareness      OT Treatment/Interventions: Self-care/ADL training;DME and/or AE instruction;Patient/family education;Therapeutic activities    OT Goals(Current goals can  be found in the care plan section) Acute Rehab OT Goals Patient Stated Goal: get back to my dog OT Goal Formulation: With patient Time For Goal Achievement: 07/11/16 Potential to Achieve Goals: Good  OT Frequency: Min 2X/week           Co-evaluation PT/OT/SLP Co-Evaluation/Treatment: Yes Reason for Co-Treatment: To address functional/ADL transfers   OT goals addressed during session: ADL's and self-care       AM-PAC PT "6 Clicks" Daily Activity     Outcome Measure Help from another person eating meals?: None Help from another person taking care of personal grooming?: A Little Help from another person toileting, which includes using toliet, bedpan, or urinal?: A Lot Help from another person bathing (including washing, rinsing, drying)?: A Lot Help from another person to put on and taking off regular upper body clothing?: A Little Help from another person to put on and taking off regular lower body clothing?: A Lot 6 Click Score: 16   End of Session Equipment Utilized During Treatment: Rolling walker Nurse Communication: Mobility status  Activity Tolerance: Patient tolerated treatment well Patient left: in chair;with call bell/phone within reach;with chair alarm set;with family/visitor present  OT Visit Diagnosis: Unsteadiness on feet (R26.81);Pain;Repeated falls (R29.6);Muscle weakness (generalized) (M62.81) Pain - Right/Left: Right Pain - part of body: Hip                Time: 0910-1000 OT Time Calculation (min): 50 min Charges:  OT General Charges $OT Visit: 1 Procedure OT Evaluation $OT Eval Moderate Complexity: 1 Procedure OT Treatments $Self Care/Home Management : 8-22 mins G-Codes:     Kari Baars, OT [phone]  Payton Mccallum D 06/27/2016, 11:11 AM

## 2016-06-27 NOTE — Evaluation (Signed)
Physical Therapy Evaluation Patient Details Name: Desiree [name] MRN: [id number] DOB: 03-22-1924 Today's Date: 06/27/2016   History of Present Illness  83 yo female s/p fall in which she broke R hip.  Pt had R CANNULATED HIP PINNING 06/26/16  Clinical Impression  The patient is very motivated to attempt mobility with little assistance from the  Therapist. Patient does not have 24/7 caregivers unless arranged. Recommend SNF. Daughter present. Pt admitted with above diagnosis. Pt currently with functional limitations due to the deficits listed below (see PT Problem List).  Pt will benefit from skilled PT to increase their independence and safety with mobility to allow discharge to the venue listed below.       Follow Up Recommendations SNF;Supervision/Assistance - 24 hour    Equipment Recommendations  None recommended by PT    Recommendations for Other Services       Precautions / Restrictions Precautions Precautions: Fall Restrictions Weight Bearing Restrictions: Yes RLE Weight Bearing: Weight bearing as tolerated      Mobility  Bed Mobility Overal bed mobility: Needs Assistance Bed Mobility: Supine to Sit     Supine to sit: Mod assist     General bed mobility comments: patient wanted to do as much for herself , required assist for trunk to sitting up at bed edge.  Transfers Overall transfer level: Needs assistance Equipment used: Rolling walker (2 wheeled) Transfers: Sit to/from [facility] Sit to Stand: Mod assist;+2 physical assistance;+2 safety/equipment Stand pivot transfers: Mod assist;+2 physical assistance;+2 safety/equipment       General transfer comment: VC for technique, assist for balance. Again, patient does not want help from the therapist. Pivot to [facility], then 5' to recliner.  Ambulation/Gait                Stairs            Wheelchair Mobility    Modified Rankin (Stroke Patients Only)       Balance                                              Pertinent Vitals/Pain Pain Assessment: Faces Pain Score: 4  Faces Pain Scale: Hurts little more Pain Location: RIGHT HIP Pain Descriptors / Indicators: Sore Pain Intervention(s): Premedicated before session;Monitored during session;Ice applied    Home Living Family/patient expects to be discharged to:: Private residence Living Arrangements: Alone Available Help at Discharge: Family Type of Home: House       Home Layout: One level Home Equipment: Environmental consultant - 2 wheels;Grab bars - tub/shower;Grab bars - toilet      Prior Function Level of Independence: Independent with assistive device(s)         Comments: HAS FALLS     Hand Dominance        Extremity/Trunk Assessment   Upper Extremity Assessment Upper Extremity Assessment: Defer to OT evaluation    Lower Extremity Assessment Lower Extremity Assessment: RLE deficits/detail RLE Deficits / Details: able to place weight on the right leg during transfers    Cervical / Trunk Assessment Cervical / Trunk Assessment: Kyphotic  Communication   Communication: No difficulties  Cognition Arousal/Alertness: Awake/alert Behavior During Therapy: WFL for tasks assessed/performed Overall Cognitive Status: Within Functional Limits for tasks assessed  General Comments      Exercises     Assessment/Plan    PT Assessment Patient needs continued PT services  PT Problem List Decreased strength;Decreased range of motion;Decreased activity tolerance;Decreased balance;Decreased mobility;Decreased knowledge of precautions;Decreased safety awareness;Decreased knowledge of use of DME;Pain       PT Treatment Interventions DME instruction;Gait training;Functional mobility training;Therapeutic activities;Patient/family education    PT Goals (Current goals can be found in the Care Plan section)  Acute Rehab PT Goals Patient Stated  Goal: get back to my dog PT Goal Formulation: With patient/family Time For Goal Achievement: 07/11/16 Potential to Achieve Goals: Good    Frequency Min 3X/week   Barriers to discharge Decreased caregiver support      Co-evaluation PT/OT/SLP Co-Evaluation/Treatment: Yes Reason for Co-Treatment: To address functional/ADL transfers PT goals addressed during session: Mobility/safety with mobility OT goals addressed during session: ADL's and self-care       AM-PAC PT "6 Clicks" Daily Activity  Outcome Measure Difficulty turning over in bed (including adjusting bedclothes, sheets and blankets)?: A Little Difficulty moving from lying on back to sitting on the side of the bed? : A Little Difficulty sitting down on and standing up from a chair with arms (e.g., wheelchair, bedside commode, etc,.)?: A Lot Help needed moving to and from a bed to chair (including a wheelchair)?: A Lot Help needed walking in hospital room?: A Lot Help needed climbing 3-5 steps with a railing? : A Lot 6 Click Score: 14    End of Session   Activity Tolerance: Patient tolerated treatment well Patient left: in chair;with call bell/phone within reach;with chair alarm set;with family/visitor present Nurse Communication: Mobility status PT Visit Diagnosis: Pain;Difficulty in walking, not elsewhere classified (R26.2);History of falling (Z91.81)    Time: 1194-1740 PT Time Calculation (min) (ACUTE ONLY): 35 min   Charges:   PT Evaluation $PT Eval Low Complexity: 1 Procedure     PT G CodesTresa Endo PT [place] 06/27/2016, 1:43 PM

## 2016-06-28 DIAGNOSIS — D649 Anemia, unspecified: Secondary | ICD-10-CM

## 2016-06-28 LAB — PROTIME-INR
INR: 2.16
Prothrombin Time: 24.4 seconds — ABNORMAL HIGH (ref 11.4–15.2)

## 2016-06-28 LAB — CBC
HEMATOCRIT: 30.8 % — AB (ref 36.0–46.0)
HEMOGLOBIN: 9.5 g/dL — AB (ref 12.0–15.0)
MCH: 30.2 pg (ref 26.0–34.0)
MCHC: 30.8 g/dL (ref 30.0–36.0)
MCV: 97.8 fL (ref 78.0–100.0)
Platelets: 124 10*3/uL — ABNORMAL LOW (ref 150–400)
RBC: 3.15 MIL/uL — AB (ref 3.87–5.11)
RDW: 14.1 % (ref 11.5–15.5)
WBC: 6.5 10*3/uL (ref 4.0–10.5)

## 2016-06-28 MED ORDER — WARFARIN SODIUM 2.5 MG PO TABS
2.5000 mg | ORAL_TABLET | Freq: Once | ORAL | Status: AC
  Administered 2016-06-28: 2.5 mg via ORAL
  Filled 2016-06-28: qty 1

## 2016-06-28 MED ORDER — POLYSACCHARIDE IRON COMPLEX 150 MG PO CAPS
150.0000 mg | ORAL_CAPSULE | Freq: Every day | ORAL | Status: DC
  Administered 2016-06-29: 150 mg via ORAL
  Filled 2016-06-28: qty 1

## 2016-06-28 MED ORDER — SENNOSIDES-DOCUSATE SODIUM 8.6-50 MG PO TABS: 1.0000 | ORAL_TABLET | Freq: Every day | ORAL | Status: DC

## 2016-06-28 MED ORDER — POLYETHYLENE GLYCOL 3350 17 G PO PACK
17.0000 g | PACK | Freq: Every day | ORAL | Status: DC
  Administered 2016-06-29: 17 g via ORAL
  Filled 2016-06-28: qty 1

## 2016-06-28 NOTE — Clinical Social Work Note (Signed)
Clinical Social Work Assessment  Patient Details  Name: Desiree [name] MRN: [id number] Date of Birth: 1924-11-06  Date of referral:  06/28/16               Reason for consult:  Facility Placement                Permission sought to share information with:  Chartered certified accountant granted to share information::  Yes, Verbal Permission Granted  Name::     Publishing rights manager::  SNF  Relationship::  daughter  Contact Information:     Housing/Transportation Living arrangements for the past 2 months:  Single Family Home Source of Information:  Patient, Adult Children Patient Interpreter Needed:  None Criminal Activity/Legal Involvement Pertinent to Current Situation/Hospitalization:  No - Comment as needed Significant Relationships:  Adult Children Lives with:  Self Do you feel safe going back to the place where you live?  No Need for family participation in patient care:  Yes (Comment)  Care giving concerns:  Patient independent with ADL's prior to hospitalization. Patient not safe to return home at this time due to impairment and limitations.  Social Worker assessment / plan:  CSW met with patient and daughter Desiree [name] at bedside to discuss DC plans per PT recommendations.  CSW discussed role and SNF options/placement. Patient and daughter understand the needs for SNF and gave verbal permission to send out offers. Families first choices is [facility], [facility] and [facility].  Family agreed to send out SNF's to [place] area.    FL2 updated with [place] and resent to doctor.  Offers sent.  Employment status:  Retired Nurse, adult PT Recommendations:  [place] / Referral to community resources:  [place]  Patient/Family's Response to care:  Patient and daughter appreciative of CSW  assistance with DC plan.  Family did not identify any issues or concerns.  Patient/Family's Understanding of and  Emotional Response to Diagnosis, Current Treatment, and Prognosis:  Patient and family has good understanding of the diagnosis, current treatment and prognosis and are agreeable with the DC recommendation to SNF. They are hopeful that short term rehabilitation will return patient to baseline prior to hospitalization. No issues or concerns identified at this time.  Emotional Assessment Appearance:  Appears stated age Attitude/Demeanor/Rapport:   (Cooperative) Affect (typically observed):  Accepting, Appropriate Orientation:  Oriented to Self, Oriented to Place, Oriented to  Time, Oriented to Situation Alcohol / Substance use:  Not Applicable Psych involvement (Current and /or in the community):  No (Comment)  Discharge Needs  Concerns to be addressed:  Care Coordination Readmission within the last 30 days:  Yes Current discharge risk:  Dependent with Mobility, Physical Impairment Barriers to Discharge:  No Barriers Identified   Normajean Baxter, LCSW 06/28/2016, 4:27 PM

## 2016-06-28 NOTE — Progress Notes (Signed)
   Subjective: 2 Days Post-Op Procedure(s) (LRB): CANNULATED HIP PINNING (Right)  c/o mild pain in the right hip Concerned about putting weight on the leg and therapy Denies any new symptoms or issues Plan for SNF possibly as early as tomorrow Patient reports pain as mild.  Objective:   VITALS:   Vitals:   06/28/16 0019 06/28/16 0432  BP: (!) 113/52 (!) 141/58  Pulse: 80 81  Resp: 20 18  Temp:  98.3 F (36.8 C)    Right hip incision healing well nv intact distally No rashes or edema  LABS  Recent Labs  06/26/16 2025 06/27/16 0543 06/28/16 0543  HGB 10.3* 10.3* 9.5*  HCT 34.0* 31.9* 30.8*  WBC 7.0 4.1 6.5  PLT 122* 114* 124*     Recent Labs  06/25/16 1829 06/25/16 1842 06/26/16 2025  NA 137 139  --   K 3.8 3.8  --   BUN 26* 26*  --   CREATININE 0.74 0.70 0.79  GLUCOSE 115* 110*  --      Assessment/Plan: 2 Days Post-Op Procedure(s) (LRB): CANNULATED HIP PINNING (Right) Continue PT/OT D/c planning to SNF once medically stable Pain management as needed    Merla Riches, MPAS, PA-C  06/28/2016, 8:13 AM

## 2016-06-28 NOTE — Progress Notes (Signed)
[place] for warfarin Indication: atrial fibrillation and pulmonary embolus  Allergies  Allergen Reactions  . Celebrex [Celecoxib] Other (See Comments)    Reaction:  Anemia/leukopenia  . Diovan [Valsartan] Other (See Comments)    Reaction:  Increases potassium levels/acute renal failure     Patient Measurements: Height: 5\' 2"  (157.5 cm) Weight: 109 lb 5.6 oz (49.6 kg) IBW/kg (Calculated) : 50.1 Heparin Dosing Weight: 52.7kg  Vital Signs: Temp: 98.3 F (36.8 C) (05/06 0432) Temp Source: Oral (05/06 0432) BP: 141/58 (05/06 0432) Pulse Rate: 81 (05/06 0432)  Labs:  Recent Labs  06/25/16 1829 06/25/16 1842 06/25/16 2115 06/26/16 0910 06/26/16 2025 06/27/16 0543 06/28/16 0543  HGB 10.9* 10.9*  --   --  10.3* 10.3* 9.5*  HCT 33.7* 32.0*  --   --  34.0* 31.9* 30.8*  PLT 142*  --   --   --  122* 114* 124*  APTT  --   --  37*  --   --   --   --   LABPROT  --   --  23.0*  --   --  22.9* 24.4*  INR  --   --  2.00  --   --  1.99 2.16  HEPARINUNFRC  --   --   --  0.24*  --   --   --   CREATININE 0.74 0.70  --   --  0.79  --   --   CKTOTAL  --   --  63  --   --   --   --     Estimated Creatinine Clearance: 37.3 mL/min (by C-G formula based on SCr of 0.79 mg/dL).   Medical History: Past Medical History:  Diagnosis Date  . Anemia   . Cancer ([place])    skin cancer removed from top of head.per pt  . Carotid artery disease ([place])   . CHF (congestive heart failure), NYHA class II (HCC)    has sudden onset decom CHF? 2/2 to PNA  . Diverticulosis    chronic issues with consitpation and diarrhea  . Heart murmur   . Hypercholesterolemia   . Hypertension   . Hypertensive heart disease without CHF   . Hypothyroidism   . Irregular heart beat   . Lumbar disc disease   . Lumbar disc disease   . Mitral regurgitation   . Osteoarthritis    chronic-on multiple pain meds.  . Paroxysmal atrial fibrillation (HCC)    not on  coumadin-Cards=[place]-saw him ?1 yr ago  . Pneumonia    few times  . Urinary tract infection    hx of UTI    Assessment: 70 yof presented to the ED after a fall now s/p hip fx pinning on 5/4 PM. Lovenox 40mg  daily started POD1 now to restart warfarin per Rx. Home warfarin dose prior to 2.5mg  qpm.   Today, 06/28/2016:  INR now therapeutic - 2.5mg  5/5 PM  Hgb down slightly, Plts improved to 124k  No reported bleeding  Goal of Therapy:  Heparin level 0.3-0.7 units/ml Monitor platelets by anticoagulation protocol: Yes   Plan:  1) Repeat Warfarin 2.5mg  tonight 2) Discontinue Lovenox with INR > 2 3) daily INR   Adrian Saran, PharmD, BCPS Pager [phone] 06/28/2016 11:03 AM

## 2016-06-28 NOTE — Progress Notes (Signed)
[facility] PROGRESS NOTE  Desiree [name] RKY:[id number] DOB: 1924/11/25 DOA: 06/25/2016 PCP: London Pepper, MD  Interim summary and history of present illness: 83 year old female with past medical history significant for hypertension, hyperlipidemia, hypothyroidism, pulmonary embolism/paroxysmal atrial fibrillation (on chronic Coumadin), diastolic heart failure and chronic back pain; who presented to the emergency department secondary to right hip pain and inability for weightbearing after mechanical fall.   Patient found with to have right femoral neck fracture. Orthopedic on board and looking to performed surgery (06/26/16)  Assessment/Plan:  1-Right hip pain/femoral neck fracture -status post right hip pinning on 5/4 -tolerated procedure well and w/o major complications  -will continue PRN analgesics -patient resume on chronic coumadin, which will protect her as well for DVT  -will follow orthopedic service post-operative rec's    2-fall and gait instability - PT/OT has seen patient and recommended SNF for rehabilitation -SW made aware -will pursuit placement once bed available   3-history of pulmonary embolism and paroxysmal atrial fibrillation - has remained in sinus rhythm and rate is controlled. - continue metoprolol -will continue coumadin per pharmacy; INR 2.16 today - CHADsVASC score 6 -no CP or SOB  4- chronic diastolic heart failure: - appears to be compensated - will continue daily weights and strict I's and O's -will continue b-blocker and will follow volume status  -continue watching sodium intake    5-chronic back pain secondary to lumbar disc disease  -continue pain meds as needed   6- hypothyroidism - TSH WNL - will continue synthroid    7-hyperlipidemia -continue statins   8-severe protein calorie malnutrition  -will follow rec's from nutritional service and use feeding supplements   9-post operative anemia -asymptomatic -will start  niferex -follow Hgb trend  Code Status: DNR Family Communication: daughter at bedside  Disposition Plan:  Patient tolerated surgical procedure well. Per PT/OT evaluation will benefit of SNF at discharge. Will follow post operative instructions from Orthopedic service. Coumadin resume and therapeutic INR achieved.   Consultants:  Orthopedic service  Cardiology   Procedures:  See below for x-ray reports  Right hip cannulated pinning 06/26/16  Antibiotics:  Received Ancef for prophylaxis prior to OR visit.  HPI/Subjective: Patient is feeling well, denies CP and SOB. Endorses mild pain in her hip.  Objective: Vitals:   06/28/16 1609 06/28/16 2021  BP: (!) 114/41 (!) 127/57  Pulse: 85 78  Resp: 19 18  Temp: 98.4 F (36.9 C) 98.6 F (37 C)    Intake/Output Summary (Last 24 hours) at 06/28/16 2313 Last data filed at 06/28/16 1558  Gross per 24 hour  Intake              480 ml  Output              701 ml  Net             -221 ml   Filed Weights   06/25/16 2351  Weight: 49.6 kg (109 lb 5.6 oz)    Exam:   General:   Afebrile, denies chest pain and shortness of breath.  Patient reports that hip pain is mild. No nausea, no vomiting.  Cardiovascular:  Regular rate, no rubs, no gallops, positive soft murmur. No JVD.  Respiratory:  Good air movement, no wheezing, no crackles  Abdomen:  Soft, NT, ND, positive BS  Musculoskeletal:  Right hip pain with movement mainly; no swelling, clean dressings in place.  No cyanosis or clubbing seen.  Data Reviewed: Basic Metabolic Panel:  Recent  Labs Lab 06/25/16 1829 06/25/16 1842 06/26/16 2025  NA 137 139  --   K 3.8 3.8  --   CL 104 102  --   CO2 25  --   --   GLUCOSE 115* 110*  --   BUN 26* 26*  --   CREATININE 0.74 0.70 0.79  CALCIUM 9.2  --   --    CBC:  Recent Labs Lab 06/25/16 1829 06/25/16 1842 06/26/16 2025 06/27/16 0543 06/28/16 0543  WBC 10.2  --  7.0 4.1 6.5  NEUTROABS 8.8*  --   --   --   --    HGB 10.9* 10.9* 10.3* 10.3* 9.5*  HCT 33.7* 32.0* 34.0* 31.9* 30.8*  MCV 94.7  --  98.0 94.7 97.8  PLT 142*  --  122* 114* 124*   Cardiac Enzymes:  Recent Labs Lab 06/25/16 2115  CKTOTAL 63   BNP (last 3 results)  Recent Labs  06/25/16 2115  BNP 166.0*   CBG: No results for input(s): GLUCAP in the last 168 hours.  Recent Results (from the past 240 hour(s))  Surgical pcr screen     Status: None   Collection Time: 06/26/16  3:18 AM  Result Value Ref Range Status   MRSA, PCR NEGATIVE NEGATIVE Final   Staphylococcus aureus NEGATIVE NEGATIVE Final    Comment:        The Xpert SA Assay (FDA approved for NASAL specimens in patients over 40 years of age), is one component of a comprehensive surveillance program.  Test performance has been validated by [facility] for patients greater than or equal to 73 year old. It is not intended to diagnose infection nor to guide or monitor treatment.      Studies: No results found.  Scheduled Meds: . atorvastatin  40 mg Oral q1800  . cholecalciferol  1,000 Units Oral Daily  . feeding supplement (ENSURE ENLIVE)  237 mL Oral TID BM  . fesoterodine  8 mg Oral Daily  . gabapentin  400 mg Oral QHS  . [START ON 06/29/2016] iron polysaccharides  150 mg Oral Daily  . levothyroxine  75 mcg Oral QAC breakfast  . metoprolol succinate  25 mg Oral Daily  . multivitamin with minerals  1 tablet Oral Daily  . [START ON 06/29/2016] polyethylene glycol  17 g Oral Daily  . senna-docusate  1 tablet Oral QHS  . Warfarin - Pharmacist Dosing Inpatient   Does not apply q1800   Continuous Infusions:   Principal Problem:   Fracture of femoral neck, right, closed (HCC) Active Problems:   Lumbar disc disease   Hypothyroidism   Hyperlipidemia   Pulmonary embolus (HCC)   S/P MVR (mitral valve repair)   Gait instability   PAF (paroxysmal atrial fibrillation) (HCC)   Protein-calorie malnutrition, severe    Time spent: 20 minutes    Barton Dubois  [facility] Pager [phone]. If 7PM-7AM, please contact night-coverage at [email or web address], password [facility] 06/28/2016, 11:13 PM  LOS: 3 days
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
## 2016-06-29 ENCOUNTER — Encounter (HOSPITAL_COMMUNITY): Payer: Self-pay | Admitting: Orthopedic Surgery

## 2016-06-29 DIAGNOSIS — R278 Other lack of coordination: Secondary | ICD-10-CM | POA: Diagnosis not present

## 2016-06-29 DIAGNOSIS — G4700 Insomnia, unspecified: Secondary | ICD-10-CM | POA: Diagnosis not present

## 2016-06-29 DIAGNOSIS — R5381 Other malaise: Secondary | ICD-10-CM | POA: Diagnosis not present

## 2016-06-29 DIAGNOSIS — S79911A Unspecified injury of right hip, initial encounter: Secondary | ICD-10-CM | POA: Diagnosis not present

## 2016-06-29 DIAGNOSIS — Z9181 History of falling: Secondary | ICD-10-CM | POA: Diagnosis not present

## 2016-06-29 DIAGNOSIS — S92153A Displaced avulsion fracture (chip fracture) of unspecified talus, initial encounter for closed fracture: Secondary | ICD-10-CM | POA: Diagnosis not present

## 2016-06-29 DIAGNOSIS — R52 Pain, unspecified: Secondary | ICD-10-CM | POA: Diagnosis not present

## 2016-06-29 DIAGNOSIS — M5136 Other intervertebral disc degeneration, lumbar region: Secondary | ICD-10-CM | POA: Diagnosis not present

## 2016-06-29 DIAGNOSIS — R2681 Unsteadiness on feet: Secondary | ICD-10-CM | POA: Diagnosis not present

## 2016-06-29 DIAGNOSIS — R262 Difficulty in walking, not elsewhere classified: Secondary | ICD-10-CM | POA: Diagnosis not present

## 2016-06-29 DIAGNOSIS — E785 Hyperlipidemia, unspecified: Secondary | ICD-10-CM | POA: Diagnosis not present

## 2016-06-29 DIAGNOSIS — M519 Unspecified thoracic, thoracolumbar and lumbosacral intervertebral disc disorder: Secondary | ICD-10-CM | POA: Diagnosis not present

## 2016-06-29 DIAGNOSIS — S72001D Fracture of unspecified part of neck of right femur, subsequent encounter for closed fracture with routine healing: Secondary | ICD-10-CM | POA: Diagnosis not present

## 2016-06-29 DIAGNOSIS — M6281 Muscle weakness (generalized): Secondary | ICD-10-CM | POA: Diagnosis not present

## 2016-06-29 DIAGNOSIS — I4891 Unspecified atrial fibrillation: Secondary | ICD-10-CM | POA: Diagnosis not present

## 2016-06-29 DIAGNOSIS — E559 Vitamin D deficiency, unspecified: Secondary | ICD-10-CM | POA: Diagnosis not present

## 2016-06-29 DIAGNOSIS — I2699 Other pulmonary embolism without acute cor pulmonale: Secondary | ICD-10-CM | POA: Diagnosis not present

## 2016-06-29 DIAGNOSIS — E039 Hypothyroidism, unspecified: Secondary | ICD-10-CM | POA: Diagnosis not present

## 2016-06-29 DIAGNOSIS — I480 Paroxysmal atrial fibrillation: Secondary | ICD-10-CM | POA: Diagnosis not present

## 2016-06-29 DIAGNOSIS — E43 Unspecified severe protein-calorie malnutrition: Secondary | ICD-10-CM | POA: Diagnosis not present

## 2016-06-29 DIAGNOSIS — Z4789 Encounter for other orthopedic aftercare: Secondary | ICD-10-CM | POA: Diagnosis not present

## 2016-06-29 DIAGNOSIS — I10 Essential (primary) hypertension: Secondary | ICD-10-CM | POA: Diagnosis not present

## 2016-06-29 DIAGNOSIS — Z9889 Other specified postprocedural states: Secondary | ICD-10-CM | POA: Diagnosis not present

## 2016-06-29 DIAGNOSIS — K5900 Constipation, unspecified: Secondary | ICD-10-CM | POA: Diagnosis not present

## 2016-06-29 DIAGNOSIS — D649 Anemia, unspecified: Secondary | ICD-10-CM | POA: Diagnosis not present

## 2016-06-29 LAB — CBC
HEMATOCRIT: 28.9 % — AB (ref 36.0–46.0)
Hemoglobin: 9.3 g/dL — ABNORMAL LOW (ref 12.0–15.0)
MCH: 30.8 pg (ref 26.0–34.0)
MCHC: 32.2 g/dL (ref 30.0–36.0)
MCV: 95.7 fL (ref 78.0–100.0)
Platelets: 124 10*3/uL — ABNORMAL LOW (ref 150–400)
RBC: 3.02 MIL/uL — ABNORMAL LOW (ref 3.87–5.11)
RDW: 14.2 % (ref 11.5–15.5)
WBC: 7 10*3/uL (ref 4.0–10.5)

## 2016-06-29 LAB — PROTIME-INR
INR: 2.06
Prothrombin Time: 23.6 seconds — ABNORMAL HIGH (ref 11.4–15.2)

## 2016-06-29 LAB — BASIC METABOLIC PANEL
ANION GAP: 7 (ref 5–15)
BUN: 21 mg/dL — ABNORMAL HIGH (ref 6–20)
CALCIUM: 8.9 mg/dL (ref 8.9–10.3)
CHLORIDE: 99 mmol/L — AB (ref 101–111)
CO2: 30 mmol/L (ref 22–32)
Creatinine, Ser: 0.74 mg/dL (ref 0.44–1.00)
GFR calc non Af Amer: >60 mL/min (ref >60)
GLUCOSE: 116 mg/dL — AB (ref 65–99)
POTASSIUM: 4 mmol/L (ref 3.5–5.1)
Sodium: 136 mmol/L (ref 135–145)

## 2016-06-29 MED ORDER — SENNOSIDES-DOCUSATE SODIUM 8.6-50 MG PO TABS: 1.0000 | ORAL_TABLET | Freq: Every day | ORAL | Status: AC

## 2016-06-29 MED ORDER — POLYETHYLENE GLYCOL 3350 17 G PO PACK: 17.0000 g | PACK | Freq: Every day | ORAL | Status: AC

## 2016-06-29 MED ORDER — ENSURE ENLIVE PO LIQD: 237.0000 mL | Freq: Three times a day (TID) | ORAL | Status: AC

## 2016-06-29 MED ORDER — METHOCARBAMOL 500 MG PO TABS: 500.0000 mg | ORAL_TABLET | Freq: Three times a day (TID) | ORAL | 0 refills | Status: AC | PRN

## 2016-06-29 MED ORDER — HYDROCODONE-ACETAMINOPHEN 5-325 MG PO TABS: 1.0000 | ORAL_TABLET | Freq: Four times a day (QID) | ORAL | 0 refills | Status: AC | PRN

## 2016-06-29 MED ORDER — ACETAMINOPHEN 325 MG PO TABS: 650.0000 mg | ORAL_TABLET | Freq: Four times a day (QID) | ORAL | Status: DC | PRN

## 2016-06-29 MED ORDER — WARFARIN SODIUM 2.5 MG PO TABS: 2.5000 mg | ORAL_TABLET | Freq: Once | ORAL | Status: DC

## 2016-06-29 MED ORDER — TRAMADOL HCL 50 MG PO TABS: 50.0000 mg | ORAL_TABLET | Freq: Four times a day (QID) | ORAL | 0 refills | Status: AC | PRN

## 2016-06-29 MED ORDER — POLYSACCHARIDE IRON COMPLEX 150 MG PO CAPS: 150.0000 mg | ORAL_CAPSULE | Freq: Every day | ORAL | Status: AC

## 2016-06-29 NOTE — Progress Notes (Signed)
Patient discharged to SNF for rehab via ambulance, family at the bedside during transportation. report given to Lisa-RN the nursing supervisor at [facility]. Patient alert and oriented, in no distress. surgicals incision dry/intact with old blood, dressing applied, no other wound noted.

## 2016-06-29 NOTE — Progress Notes (Signed)
   Subjective: 3 Days Post-Op Procedure(s) (LRB): CANNULATED HIP PINNING (Right) Patient reports pain as mild.   Plan is to go Skilled nursing facility after hospital stay.  Objective: Vital signs in last 24 hours: Temp:  [98.4 F (36.9 C)-98.6 F (37 C)] 98.5 F (36.9 C) (05/07 0532) Pulse Rate:  [78-85] 85 (05/07 0532) Resp:  [18-19] 19 (05/07 0532) BP: (114-127)/(41-57) 121/49 (05/07 0532) SpO2:  [91 %-96 %] 93 % (05/07 0532)  Intake/Output from previous day:  Intake/Output Summary (Last 24 hours) at 06/29/16 0646 Last data filed at 06/28/16 1558  Gross per 24 hour  Intake              480 ml  Output              701 ml  Net             -221 ml    Intake/Output this shift: No intake/output data recorded.  Labs:  Recent Labs  06/26/16 2025 06/27/16 0543 06/28/16 0543 06/29/16 0442  HGB 10.3* 10.3* 9.5* 9.3*    Recent Labs  06/28/16 0543 06/29/16 0442  WBC 6.5 7.0  RBC 3.15* 3.02*  HCT 30.8* 28.9*  PLT 124* 124*    Recent Labs  06/26/16 2025  CREATININE 0.79    Recent Labs  06/28/16 0543 06/29/16 0442  INR 2.16 2.06    EXAM General - Patient is Alert, Appropriate and Oriented Extremity - Neurologically intact Neurovascular intact Incision: dressing C/D/I No cellulitis present Dressing/Incision - clean, dry, no drainage Motor Function - intact, moving foot and toes well on exam.   Past Medical History:  Diagnosis Date  . Anemia   . Cancer ([place])    skin cancer removed from top of head.per pt  . Carotid artery disease ([place])   . CHF (congestive heart failure), NYHA class II (HCC)    has sudden onset decom CHF? 2/2 to PNA  . Diverticulosis    chronic issues with consitpation and diarrhea  . Heart murmur   . Hypercholesterolemia   . Hypertension   . Hypertensive heart disease without CHF   . Hypothyroidism   . Irregular heart beat   . Lumbar disc disease   . Lumbar disc disease   . Mitral regurgitation   . Osteoarthritis    chronic-on multiple pain meds.  . Paroxysmal atrial fibrillation (HCC)    not on coumadin-Cards=[facility]-saw him ?1 yr ago  . Pneumonia    few times  . Urinary tract infection    hx of UTI    Assessment/Plan: 3 Days Post-Op Procedure(s) (LRB): CANNULATED HIP PINNING (Right) Principal Problem:   Fracture of femoral neck, right, closed (HCC) Active Problems:   Lumbar disc disease   Hypothyroidism   Hyperlipidemia   Pulmonary embolus (HCC)   S/P MVR (mitral valve repair)   Gait instability   PAF (paroxysmal atrial fibrillation) (HCC)   Protein-calorie malnutrition, severe   Discharge to SNF  DVT Prophylaxis - Coumadin Weight Bearing As Tolerated right Leg  Gerrica Cygan V 06/29/2016, 6:46 AM

## 2016-06-29 NOTE — Discharge Instructions (Signed)
Hip Fracture A hip fracture is a fracture of the upper part of your thigh bone (femur). What are the causes? A hip fracture is caused by a direct blow to the side of your hip. This is usually the result of a fall but can occur in other circumstances, such as an automobile accident. What increases the risk? There is an increased risk of hip fractures in people with:  An unsteady walking pattern (gait) and those with conditions that contribute to poor balance, such as Parkinson's disease or dementia.  Osteopenia and osteoporosis.  Cancer that spreads to the leg bones.  Certain metabolic diseases. What are the signs or symptoms? Symptoms of hip fracture include:  Pain over the injured hip.  Inability to put weight on the leg in which the fracture occurred (although, some patients are able to walk after a hip fracture).  Toes and foot of the affected leg point outward when you lie down. How is this diagnosed? A physical exam can determine if a hip fracture is likely to have occurred. X-ray exams are needed to confirm the fracture and to look for other injuries. The X-ray exam can help to determine the type of hip fracture. Rarely, the fracture is not visible on an X-ray image and a CT scan or MRI will have to be done. How is this treated? The treatment for a fracture is usually surgery. This means using a screw, nail, or rod to hold the bones in place. Follow these instructions at home: Take all medicines as directed by your health care provider. Contact a health care provider if: Pain continues, even after taking pain medicine. This information is not intended to replace advice given to you by your health care provider. Make sure you discuss any questions you have with your health care provider. Document Released: 02/09/2005 Document Revised: 07/18/2015 Document Reviewed: 09/21/2012 Elsevier Interactive Patient Education  2017 [place]  items at home which could result in a fall. This includes throw rugs or furniture in walking pathways o ICE to the affected joint every three hours while awake for 30 minutes at a time, for at least the first 3-5 days, and then as needed for pain and swelling.  Continue to use ice for pain and swelling. You may notice swelling that will progress down to the foot and ankle.  This is normal after surgery.  Elevate your leg when you are not up walking on it.   o Continue to use the breathing machine you got in the hospital (incentive spirometer) which will help keep your temperature down.  It is common for your temperature to cycle up and down following surgery, especially at night when you are not up moving around and exerting yourself.  The breathing machine keeps your lungs expanded and your temperature down.   DIET:  As you were doing prior to hospitalization, we recommend a well-balanced diet.  DRESSING / WOUND CARE / SHOWERING  You may change your dressing 3-5 days after surgery.  Then change the dressing every day with sterile gauze.  Please use good hand washing techniques before changing the dressing.  Do not use any lotions or creams on the incision until instructed by your surgeon.  ACTIVITY  o Increase activity slowly as tolerated, but follow the weight bearing instructions below.   o No driving for 6 weeks or until further direction given by your physician.  You cannot drive while taking narcotics.  o No  lifting or carrying greater than 10 lbs. until further directed by your surgeon. o Avoid periods of inactivity such as sitting longer than an hour when not asleep. This helps prevent blood clots.  o You may return to work once you are authorized by your doctor.     WEIGHT BEARING   Weight bearing as tolerated with assist device (walker, cane, etc) as directed, use it as long as suggested by your surgeon or therapist, typically at least 4-6 weeks.   EXERCISES  Results after  surgery are often greatly improved when you follow the exercise, range of motion and muscle strengthening exercises prescribed by your doctor. Safety measures are also important to protect the joint from further injury. Any time any of these exercises cause you to have increased pain or swelling, decrease what you are doing until you are comfortable again and then slowly increase them. If you have problems or questions, call your caregiver or physical therapist for advice.   Rehabilitation is important following a joint replacement. After just a few days of immobilization, the muscles of the leg can become weakened and shrink (atrophy).  These exercises are designed to build up the tone and strength of the thigh and leg muscles and to improve motion. Often times heat used for twenty to thirty minutes before working out will loosen up your tissues and help with improving the range of motion but do not use heat for the first two weeks following surgery (sometimes heat can increase post-operative swelling).   These exercises can be done on a training (exercise) mat, on the floor, on a table or on a bed. Use whatever works the best and is most comfortable for you.    Use music or television while you are exercising so that the exercises are a pleasant break in your day. This will make your life better with the exercises acting as a break in your routine that you can look forward to.   Perform all exercises about fifteen times, three times per day or as directed.  You should exercise both the operative leg and the other leg as well.  Exercises include:    Quad Sets - Tighten up the muscle on the front of the thigh (Quad) and hold for 5-10 seconds.    Straight Leg Raises - With your knee straight (if you were given a brace, keep it on), lift the leg to 60 degrees, hold for 3 seconds, and slowly lower the leg.  Perform this exercise against resistance later as your leg gets stronger.   Leg Slides: Lying on  your back, slowly slide your foot toward your buttocks, bending your knee up off the floor (only go as far as is comfortable). Then slowly slide your foot back down until your leg is flat on the floor again.   Angel Wings: Lying on your back spread your legs to the side as far apart as you can without causing discomfort.   Hamstring Strength:  Lying on your back, push your heel against the floor with your leg straight by tightening up the muscles of your buttocks.  Repeat, but this time bend your knee to a comfortable angle, and push your heel against the floor.  You may put a pillow under the heel to make it more comfortable if necessary.   A rehabilitation program following joint replacement surgery can speed recovery and prevent re-injury in the future due to weakened muscles. Contact your doctor or a physical therapist for more information on  knee rehabilitation.    CONSTIPATION  Constipation is defined medically as fewer than three stools per week and severe constipation as less than one stool per week.  Even if you have a regular bowel pattern at home, your normal regimen is likely to be disrupted due to multiple reasons following surgery.  Combination of anesthesia, postoperative narcotics, change in appetite and fluid intake all can affect your bowels.   YOU MUST use at least one of the following options; they are listed in order of increasing strength to get the job done.  They are all available over the counter, and you may need to use some, POSSIBLY even all of these options:    Drink plenty of fluids (prune juice may be helpful) and high fiber foods Colace 100 mg by mouth twice a day  Senokot for constipation as directed and as needed Dulcolax (bisacodyl), take with full glass of water  Miralax (polyethylene glycol) once or twice a day as needed.  If you have tried all these things and are unable to have a bowel movement in the first 3-4 days after surgery call either your surgeon or  your primary doctor.    If you experience loose stools or diarrhea, hold the medications until you stool forms back up.  If your symptoms do not get better within 1 week or if they get worse, check with your doctor.  If you experience "the worst abdominal pain ever" or develop nausea or vomiting, please contact the office immediately for further recommendations for treatment.   ITCHING:  If you experience itching with your medications, try taking only a single pain pill, or even half a pain pill at a time.  You can also use Benadryl over the counter for itching or also to help with sleep.   TED HOSE STOCKINGS:  Use stockings on both legs until for at least 2 weeks or as directed by physician office. They may be removed at night for sleeping.  MEDICATIONS:  See your medication summary on the After Visit Summary that nursing will review with you.  You may have some home medications which will be placed on hold until you complete the course of blood thinner medication.  It is important for you to complete the blood thinner medication as prescribed.  PRECAUTIONS:  If you experience chest pain or shortness of breath - call 911 immediately for transfer to the hospital emergency department.   If you develop a fever greater that 101 F, purulent drainage from wound, increased redness or drainage from wound, foul odor from the wound/dressing, or calf pain - CONTACT YOUR SURGEON.                                                   FOLLOW-UP APPOINTMENTS:  If you do not already have a post-op appointment, please call the office for an appointment to be seen by your surgeon.  Guidelines for how soon to be seen are listed in your After Visit Summary, but are typically between 1-4 weeks after surgery.   MAKE SURE YOU:   Understand these instructions.   Get help right away if you are not doing well or get worse.

## 2016-06-29 NOTE — Progress Notes (Signed)
Family continue to be concern that patient is not ready to go to rehab because she is weaker and leaning to the right side, patient responds to commands, Vitals WNL- 118/47, 81, 95%-RA, Dr. Dyann Kief informed in the room assess patient. Will continue to monitor patient.

## 2016-06-29 NOTE — Progress Notes (Signed)
Occupational Therapy Treatment Patient Details Name: Desiree [name] MRN: [id number] DOB: 11/27/1924 Today's Date: 06/29/2016    History of present illness 83 yo female s/p fall in which she broke R hip.  Pt had R CANNULATED HIP PINNING 06/26/16   OT comments  Pt needed increased A this day.  Pts daughter reported pt was a bit more tired yesterday than Saturday and even more so this day.  OT did communicate with RN.  Pt left up in chair with daughter present  Follow Up Recommendations  SNF;Other (comment) (home only if 24/7 A inplace)    Equipment Recommendations  None recommended by OT    Recommendations for Other Services      Precautions / Restrictions Restrictions Weight Bearing Restrictions: Yes RLE Weight Bearing: Weight bearing as tolerated       Mobility Bed Mobility               General bed mobility comments:  pt in chair  Transfers Overall transfer level: Needs assistance   Transfers: Sit to/from Stand;Stand Pivot Transfers Sit to Stand: Max assist;Total assist         General transfer comment: pt needed increased A this day- RN aware        ADL either performed or assessed with clinical judgement   ADL Overall ADL's : Needs assistance/impaired Eating/Feeding: Sitting;Set up   Grooming: Sitting;Set up                       [place] and Hygiene: Cueing for safety;Cueing for sequencing;Sit to/from stand;Maximal assistance         General ADL Comments: pt needed increased A and increased time this day.  OT did share with RN pts presentation.  Pt participated in several sit to stand transitions but neeed max A each time.  VC for increased time for each transition.                Cognition Arousal/Alertness: Awake/alert;Lethargic Behavior During Therapy: Flat affect Overall Cognitive Status: Within Functional Limits for tasks assessed                                 General Comments: pt not as  conversive this day              General Comments      Pertinent Vitals/ Pain       Pain Score: 7  Pain Location: back Pain Descriptors / Indicators: Sore Pain Intervention(s): Monitored during session;Repositioned;Premedicated before session;Limited activity within patient's tolerance         Frequency  Min 2X/week        Progress Toward Goals  OT Goals(current goals can now be found in the care plan section)  Progress towards OT goals: Progressing toward goals     Plan Discharge plan remains appropriate    Co-evaluation                 AM-PAC PT "6 Clicks" Daily Activity     Outcome Measure   Help from another person eating meals?: None Help from another person taking care of personal grooming?: A Little Help from another person toileting, which includes using toliet, bedpan, or urinal?: A Lot Help from another person bathing (including washing, rinsing, drying)?: A Lot Help from another person to put on and taking off regular upper body clothing?: A Lot Help from another person to  put on and taking off regular lower body clothing?: A Lot 6 Click Score: 15    End of Session Equipment Utilized During Treatment: Rolling walker  OT Visit Diagnosis: Unsteadiness on feet (R26.81);Pain;Repeated falls (R29.6);Muscle weakness (generalized) (M62.81) Pain - Right/Left: Right Pain - part of body: Hip   Activity Tolerance Patient limited by fatigue   Patient Left in chair;with call bell/phone within reach;with chair alarm set;with family/visitor present   Nurse Communication Mobility status        Time: 9872-1587 OT Time Calculation (min): 60 min  Charges: OT General Charges $OT Visit: 1 Procedure OT Treatments $Self Care/Home Management : 53-67 mins  Kari Baars, [place] [phone]   Payton Mccallum D 06/29/2016, 11:25 AM

## 2016-06-29 NOTE — Progress Notes (Signed)
Physical Therapy Treatment Patient Details Name: Desiree [name] MRN: [id number] DOB: 04/26/1924 Today's Date: 06/29/2016    History of Present Illness 83 yo female s/p fall in which she broke R hip.  Pt had R CANNULATED HIP PINNING 06/26/16    PT Comments    POD # 3 Pt OOB in recliner with daughter in room.  Pt very sleepy/groggy throughout session.  Pt was unable to offer much self assist with transfers despite max stimulation/encouragement.  Pt would briefly open eyes to say a few words then doze off again.  Assisted from recliner to [facility] vis "Bear Hug" stand pivot 1/4 turn.  Pt did void in [facility] then required + 2 assist to transfer off, hygiene then pivot to recliner.   Daughter concerned about pt's decreased level of alertness and cognition.   RN is aware she has been "sleeping a lot".  Pt will need ST Rehab at SNF prior to returning home.  Follow Up Recommendations  SNF;Supervision/Assistance - 24 hour     Equipment Recommendations         Recommendations for Other Services       Precautions / Restrictions Precautions Precautions: Fall Restrictions Weight Bearing Restrictions: No RLE Weight Bearing: Weight bearing as tolerated    Mobility  Bed Mobility               General bed mobility comments:  pt in chair  Transfers Overall transfer level: Needs assistance Equipment used: None Transfers: Squat Pivot Transfers Sit to Stand: Max assist;Total assist         General transfer comment:  stand pivot 1/4 turn from recliner to [facility] Total Assist pt 5%.  Assisted off BSC to recliner same tech.    Ambulation/Gait             General Gait Details: unable to attempt do to low transfer ability   Stairs            Wheelchair Mobility    Modified Rankin (Stroke Patients Only)       Balance                                            Cognition Arousal/Alertness: Lethargic (sleepy) Behavior During Therapy: Flat affect Overall  Cognitive Status: Impaired/Different from baseline                                 General Comments: very sleepy/groggy this session      Exercises      General Comments        Pertinent Vitals/Pain Pain Assessment: Faces Pain Score: 7  Faces Pain Scale: Hurts a little bit Pain Location: back/R hip Pain Descriptors / Indicators: Grimacing Pain Intervention(s): Monitored during session;Repositioned    Home Living                      Prior Function            PT Goals (current goals can now be found in the care plan section) Progress towards PT goals: Progressing toward goals    Frequency    Min 3X/week      PT Plan Current plan remains appropriate    Co-evaluation              AM-PAC PT "6 Clicks"  Daily Activity  Outcome Measure  Difficulty turning over in bed (including adjusting bedclothes, sheets and blankets)?: A Little Difficulty moving from lying on back to sitting on the side of the bed? : A Little Difficulty sitting down on and standing up from a chair with arms (e.g., wheelchair, bedside commode, etc,.)?: A Lot Help needed moving to and from a bed to chair (including a wheelchair)?: A Lot Help needed walking in hospital room?: A Lot Help needed climbing 3-5 steps with a railing? : A Lot 6 Click Score: 14    End of Session Equipment Utilized During Treatment: Gait belt Activity Tolerance: Other (comment) (lethargic) Patient left: in chair;with call bell/phone within reach;with chair alarm set;with family/visitor present Nurse Communication: Mobility status PT Visit Diagnosis: Pain;Difficulty in walking, not elsewhere classified (R26.2);History of falling (Z91.81)     Time: 1164-3539 PT Time Calculation (min) (ACUTE ONLY): 25 min  Charges:  $Therapeutic Activity: 23-37 mins                    G Codes:       {Pilar Westergaard  PTA [facility]  Acute  Rehab Pager      [phone]

## 2016-06-29 NOTE — Progress Notes (Signed)
Family concern that patient is more sleepy than usual and not as active, Vitals 134/58, 82, 96%-RA. Patient alert and able to answer questions appropriately, up in the chair resting. Dr. Dyann Kief notified, will continue to assess patient.

## 2016-06-29 NOTE — Discharge Summary (Addendum)
Physician Discharge Summary  NAZIRAH TRI AQT:[id number] DOB: [birth year] DOA: 06/25/2016  PCP: London Pepper, MD  Admit date: 06/25/2016 Discharge date: 06/29/2016  Time spent: 35 minutes  Recommendations for Outpatient Follow-up:  Maintain adequate hydration Physical therapy as per SNF protocol Follow up with orthopedic service in 2 weeks Please repeat CBC in 5 days to follow Hgb trend  Please repeat BMET in 5 days to follow electrolytes and renal function   Discharge Diagnoses:  Principal Problem:   Fracture of femoral neck, right, closed ([facility] [place]) Active Problems:   Lumbar disc disease   Hypothyroidism   Hyperlipidemia   Pulmonary embolus (HCC)   S/P MVR (mitral valve repair)   Gait instability   PAF (paroxysmal atrial fibrillation) (HCC)   Protein-calorie malnutrition, severe   Physical deconditioning anemia of chronic disease with post operative blood loss anemia   Discharge Condition: stable and improved. tolerated surgical procedure well and INR is therapeutic. Discharge to SNF for further care and rehabilitation.  Diet recommendation: heart healthy diet   Filed Weights   06/25/16 2351  Weight: 49.6 kg (109 lb 5.6 oz)    History of present illness:  83 year old female with past medical history significant for hypertension, hyperlipidemia, hypothyroidism, pulmonary embolism/paroxysmal atrial fibrillation (on chronic Coumadin), diastolic heart failure and chronic back pain; who presented to the emergency department secondary to right hip pain and inability for weightbearing after mechanical fall.   Patient found with to have right femoral neck fracture. Orthopedic service consulted and had surgical repair on 06/26/16.  Hospital Course:  1-Right hip pain/femoral neck fracture -status post right hip pinning on 5/4 -tolerated procedure well and w/o major complications  -will continue PRN analgesics -patient resume on chronic coumadin, which will protect her as well for DVT   -will follow orthopedic service post-operative rec's    2-fall and gait instability - PT/OT has seen patient and recommended SNF for rehabilitation -SW made aware -will pursuit placement once bed available   3-history of pulmonary embolism and paroxysmal atrial fibrillation - has remained in sinus rhythm and rate is controlled. - continue metoprolol -will continue coumadin per pharmacy; INR 2.06 at discharge (goal 2-3) - CHADsVASC score 6 -no CP or SOB  4- chronic diastolic heart failure: -appears to be compensated -will recommend daily weights -Continue b-blocker and follow volume status  -Continue watching sodium intake    5-chronic back pain secondary to lumbar disc disease  -continue pain meds as needed   6- hypothyroidism - TSH WNL - will continue synthroid    7-hyperlipidemia -continue statins   8-severe protein calorie malnutrition  -will follow rec's from nutritional service and use feeding supplements  -ensure BID and encourage to increase PO intake.  9-Anemia of chronic disease with blood loss post operative anemia -asymptomatic -will discharge on niferex -follow Hgb trend; at discharge Hgb 9.3 -patient with underlying anemia of chronic disease (reflected by baseline Hgb of 10.9 on admission and further reviewed data from electronic records demonstrating range of 10.2-10.7)  Procedures:  See below for x-ray reports  Right hip cannulated pinning 06/26/16  Consultations:  Orthopedic service   Cardiology   Discharge Exam: Vitals:   06/29/16 1042 06/29/16 1319  BP: (!) 134/58 (!) 118/43  Pulse: 82 81  Resp:  18  Temp:  98.4 F (36.9 C)    General:   Afebrile, denies chest pain and shortness of breath.  Patient reports that hip pain is mild. No nausea, no vomiting.  Cardiovascular:  Regular rate, no rubs,  no gallops, positive soft murmur. No JVD.  Respiratory:  Good air movement, no wheezing, no crackles  Abdomen:  Soft, NT, ND,  positive BS  Musculoskeletal:  Right hip pain with movement mainly; no swelling, clean dressings in place.  No cyanosis or clubbing seen.   Discharge Instructions   Discharge Instructions    Call MD / Call 911    Complete by:  As directed    If you experience chest pain or shortness of breath, CALL 911 and be transported to the hospital emergency room.  If you develope a fever above 101 F, pus (white drainage) or increased drainage or redness at the wound, or calf pain, call your surgeon's office.   Change dressing    Complete by:  As directed    You may change your dressing dressing daily with sterile 4 x 4 inch gauze dressing and paper tape.  Do not submerge the incision under water.   Constipation Prevention    Complete by:  As directed    Drink plenty of fluids.  Prune juice may be helpful.  You may use a stool softener, such as Colace (over the counter) 100 mg twice a day.  Use MiraLax (over the counter) for constipation as needed.   Diet - low sodium heart healthy    Complete by:  As directed    Diet - low sodium heart healthy    Complete by:  As directed    Discharge instructions    Complete by:  As directed    Pick up stool softner and laxative for home use following surgery while on pain medications. Do not submerge incision under water. Please use good hand washing techniques while changing dressing each day. May shower starting three days after surgery. Please use a clean towel to pat the incision dry following showers. Continue to use ice for pain and swelling after surgery. Do not use any lotions or creams on the incision until instructed by your surgeon.  Wear both TED hose on both legs during the day every day for three weeks, but may have off at night at home.  Postoperative Constipation Protocol  Constipation - defined medically as fewer than three stools per week and severe constipation as less than one stool per week.  One of the most common issues patients  have following surgery is constipation.  Even if you have a regular bowel pattern at home, your normal regimen is likely to be disrupted due to multiple reasons following surgery.  Combination of anesthesia, postoperative narcotics, change in appetite and fluid intake all can affect your bowels.  In order to avoid complications following surgery, here are some recommendations in order to help you during your recovery period.  Colace (docusate) - Pick up an over-the-counter form of Colace or another stool softener and take twice a day as long as you are requiring postoperative pain medications.  Take with a full glass of water daily.  If you experience loose stools or diarrhea, hold the colace until you stool forms back up.  If your symptoms do not get better within 1 week or if they get worse, check with your doctor.  Dulcolax (bisacodyl) - Pick up over-the-counter and take as directed by the product packaging as needed to assist with the movement of your bowels.  Take with a full glass of water.  Use this product as needed if not relieved by Colace only.   MiraLax (polyethylene glycol) - Pick up over-the-counter to have on hand.  MiraLax is a solution that will increase the amount of water in your bowels to assist with bowel movements.  Take as directed and can mix with a glass of water, juice, soda, coffee, or tea.  Take if you go more than two days without a movement. Do not use MiraLax more than once per day. Call your doctor if you are still constipated or irregular after using this medication for 7 days in a row.  If you continue to have problems with postoperative constipation, please contact the office for further assistance and recommendations.  If you experience "the worst abdominal pain ever" or develop nausea or vomiting, please contact the office immediatly for further recommendations for treatment.   Discharge instructions    Complete by:  As directed    Maintain adequate  hydration Physical therapy as per SNF protocol Follow up with orthopedic service in 2 weeks Please repeat CBC in 5 days to follow Hgb trend  Please repeat BMET in 5 days to follow electrolytes and renal function   Do not sit on low chairs, stoools or toilet seats, as it may be difficult to get up from low surfaces    Complete by:  As directed    Driving restrictions    Complete by:  As directed    No driving until released by the physician.   Increase activity slowly as tolerated    Complete by:  As directed    Lifting restrictions    Complete by:  As directed    No lifting until released by the physician.   Patient may shower    Complete by:  As directed    You may shower without a dressing once there is no drainage.  Do not wash over the wound.  If drainage remains, do not shower until drainage stops.   TED hose    Complete by:  As directed    Use stockings (TED hose) for 3 weeks on both leg(s).  You may remove them at night for sleeping.   Weight bearing as tolerated    Complete by:  As directed    Laterality:  right   Extremity:  Lower     Current Discharge Medication List    START taking these medications   Details  acetaminophen (TYLENOL) 325 MG tablet Take 2 tablets (650 mg total) by mouth every 6 (six) hours as needed for mild pain (or Fever >/= 101).    feeding supplement, ENSURE ENLIVE, (ENSURE ENLIVE) LIQD Take 237 mLs by mouth 3 (three) times daily between meals.    iron polysaccharides (NIFEREX) 150 MG capsule Take 1 capsule (150 mg total) by mouth daily.    methocarbamol (ROBAXIN) 500 MG tablet Take 1 tablet (500 mg total) by mouth every 8 (eight) hours as needed for muscle spasms. Qty: 60 tablet, Refills: 0    senna-docusate (SENOKOT-S) 8.6-50 MG tablet Take 1 tablet by mouth at bedtime.    traMADol (ULTRAM) 50 MG tablet Take 1-2 tablets (50-100 mg total) by mouth every 6 (six) hours as needed for moderate pain. Qty: 56 tablet, Refills: 0      CONTINUE  these medications which have CHANGED   Details  HYDROcodone-acetaminophen (NORCO/VICODIN) 5-325 MG tablet Take 1-2 tablets by mouth every 6 (six) hours as needed for moderate pain. Qty: 60 tablet, Refills: 0    polyethylene glycol (MIRALAX / GLYCOLAX) packet Take 17 g by mouth daily.      CONTINUE these medications which have NOT CHANGED   Details  atorvastatin (LIPITOR) 40 MG tablet Take 40 mg by mouth at bedtime.     cholecalciferol (VITAMIN D) 1000 UNITS tablet Take 1,000 Units by mouth daily.    gabapentin (NEURONTIN) 100 MG capsule Take 4 capsules at night Qty: 120 capsule, Refills: 6   Associated Diagnoses: Hereditary and idiopathic peripheral neuropathy    levothyroxine (SYNTHROID, LEVOTHROID) 75 MCG tablet Take 75 mcg by mouth daily before breakfast.     metoprolol succinate (TOPROL-XL) 25 MG 24 hr tablet Take 25 mg by mouth daily.    TOVIAZ 8 MG TB24 tablet Take 8 mg by mouth at bedtime.  Refills: 1    warfarin (COUMADIN) 2.5 MG tablet Take 2.5 mg by mouth every evening.        Allergies  Allergen Reactions  . Celebrex [Celecoxib] Other (See Comments)    Reaction:  Anemia/leukopenia  . Diovan [Valsartan] Other (See Comments)    Reaction:  Increases potassium levels/acute renal failure     Contact information for follow-up providers    Gaynelle Arabian, MD. Schedule an appointment as soon as possible for a visit on 07/14/2016.   Specialty:  Orthopedic Surgery Why:  Call [phone] tomorrow to make the appointment Contact information: [address] [place] [address] [phone]            Contact information for after-discharge care    Destination    [facility] Follow up.   Specialty:  [place] information: [address] [phone]                  The results of significant diagnostics from this hospitalization (including imaging, microbiology, ancillary  and laboratory) are listed below for reference.    Significant Diagnostic Studies: Dg Chest 2 View  Result Date: 06/25/2016 CLINICAL DATA:  Patient fell today.  Pain. EXAM: CHEST  2 VIEW COMPARISON:  None. FINDINGS: The heart size and mediastinal contours are within normal limits. Heart is enlarged. The patient is status post median sternotomy. There is aortic atherosclerosis without aneurysm. Lungs are free of pneumonic consolidation, effusion and pneumothorax. Old posttraumatic deformity of the superolateral humeral head is again seen possibly from prior shoulder dislocation. No acute fracture of the bony thorax. Stable degenerative changes are seen along the dorsal spine. IMPRESSION: No active cardiopulmonary disease. Aortic atherosclerosis. No acute osseous appearing abnormality. Electronically Signed   By: Ashley Royalty M.D.   On: 06/25/2016 19:23   Ct Head Wo Contrast  Result Date: 06/25/2016 CLINICAL DATA:  Fall injury neck and back pain with dizziness EXAM: CT HEAD WITHOUT CONTRAST CT CERVICAL SPINE WITHOUT CONTRAST TECHNIQUE: Multidetector CT imaging of the head and cervical spine was performed following the standard protocol without intravenous contrast. Multiplanar CT image reconstructions of the cervical spine were also generated. COMPARISON:  11/21/2014, 03/13/2016 FINDINGS: CT HEAD FINDINGS Brain: No acute territorial infarction, hemorrhage or intracranial mass. Moderate periventricular white matter small vessel ischemic changes. Atrophy. Stable ventricle size. Vascular: No hyperdense vessels. Carotid artery and vertebral artery calcification. Skull: Normal. Negative for fracture or focal lesion. Sinuses/Orbits: Mucosal thickening in the ethmoid sinuses. No acute orbital abnormality. Other: None CT CERVICAL SPINE FINDINGS Alignment: Stable alignment with mild anterolisthesis of C7 on T1. Facet alignment similar compared to prior study. Skull base and vertebrae: Craniovertebral junction is intact.  No fracture. Soft tissues and spinal canal: No prevertebral fluid or swelling. No visible canal hematoma. Disc levels: Multilevel degenerative disc disease, moderate severe its C2-C3  and C3-C4, severe it C4-C5, C5-C6 and C6-C7. Posterior disc osteophyte complex at C3-C4, C5-C6 and C6-C7. Multilevel bilateral facet arthropathy. Multilevel bilateral foraminal stenosis, most notable at C5-C6 and C6-C7. Upper chest: No acute changes Other: None IMPRESSION: 1. No CT evidence for acute intracranial abnormality. Moderate small vessel ischemic changes of the white matter along with atrophy 2. No acute fracture or malalignment of the cervical spine. Multilevel advanced degenerative disc changes. Electronically Signed   By: Donavan Foil M.D.   On: 06/25/2016 20:05   Ct Cervical Spine Wo Contrast  Result Date: 06/25/2016 CLINICAL DATA:  Fall injury neck and back pain with dizziness EXAM: CT HEAD WITHOUT CONTRAST CT CERVICAL SPINE WITHOUT CONTRAST TECHNIQUE: Multidetector CT imaging of the head and cervical spine was performed following the standard protocol without intravenous contrast. Multiplanar CT image reconstructions of the cervical spine were also generated. COMPARISON:  11/21/2014, 03/13/2016 FINDINGS: CT HEAD FINDINGS Brain: No acute territorial infarction, hemorrhage or intracranial mass. Moderate periventricular white matter small vessel ischemic changes. Atrophy. Stable ventricle size. Vascular: No hyperdense vessels. Carotid artery and vertebral artery calcification. Skull: Normal. Negative for fracture or focal lesion. Sinuses/Orbits: Mucosal thickening in the ethmoid sinuses. No acute orbital abnormality. Other: None CT CERVICAL SPINE FINDINGS Alignment: Stable alignment with mild anterolisthesis of C7 on T1. Facet alignment similar compared to prior study. Skull base and vertebrae: Craniovertebral junction is intact. No fracture. Soft tissues and spinal canal: No prevertebral fluid or swelling. No visible  canal hematoma. Disc levels: Multilevel degenerative disc disease, moderate severe its C2-C3 and C3-C4, severe it C4-C5, C5-C6 and C6-C7. Posterior disc osteophyte complex at C3-C4, C5-C6 and C6-C7. Multilevel bilateral facet arthropathy. Multilevel bilateral foraminal stenosis, most notable at C5-C6 and C6-C7. Upper chest: No acute changes Other: None IMPRESSION: 1. No CT evidence for acute intracranial abnormality. Moderate small vessel ischemic changes of the white matter along with atrophy 2. No acute fracture or malalignment of the cervical spine. Multilevel advanced degenerative disc changes. Electronically Signed   By: Donavan Foil M.D.   On: 06/25/2016 20:05   Dg C-arm 1-60 Min-no Report  Result Date: 06/26/2016 Fluoroscopy was utilized by the requesting physician.  No radiographic interpretation.   Dg Hip Unilat With Pelvis 1v Right  Result Date: 06/26/2016 CLINICAL DATA:  Hip fracture repair. FLUOROSCOPY TIME:  31 seconds. Images: 2 EXAM: DG HIP (WITH OR WITHOUT PELVIS) 1V RIGHT COMPARISON:  None. FINDINGS: Three screws have been placed across the right-sided hip fracture. IMPRESSION: Three screws have been placed across the right-sided hip fracture. Electronically Signed   By: Dorise Bullion III M.D   On: 06/26/2016 20:37   Dg Hip Unilat With Pelvis 2-3 Views Right  Result Date: 06/25/2016 CLINICAL DATA:  Right hip pain and groin pain after fall EXAM: DG HIP (WITH OR WITHOUT PELVIS) 2-3V RIGHT COMPARISON:  Hours 926 FINDINGS: There is an acute, subcapital impacted femoral neck fracture. No dislocation of the right hip joint. Pubic rami appear intact. Slight joint space narrowing of the right hip. Atherosclerotic calcifications are present along the course of the external iliac, common femoral and femoral arteries. IMPRESSION: New impacted right subcapital femoral neck fracture. Electronically Signed   By: Ashley Royalty M.D.   On: 06/25/2016 19:25    Microbiology: Recent Results (from the past  240 hour(s))  Surgical pcr screen     Status: None   Collection Time: 06/26/16  3:18 AM  Result Value Ref Range Status   MRSA, PCR NEGATIVE NEGATIVE Final  Staphylococcus aureus NEGATIVE NEGATIVE Final    Comment:        The Xpert SA Assay (FDA approved for NASAL specimens in patients over 70 years of age), is one component of a comprehensive surveillance program.  Test performance has been validated by [facility] for patients greater than or equal to 81 year old. It is not intended to diagnose infection nor to guide or monitor treatment.      Labs: Basic Metabolic Panel:  Recent Labs Lab 06/25/16 1829 06/25/16 1842 06/26/16 2025  NA 137 139  --   K 3.8 3.8  --   CL 104 102  --   CO2 25  --   --   GLUCOSE 115* 110*  --   BUN 26* 26*  --   CREATININE 0.74 0.70 0.79  CALCIUM 9.2  --   --    CBC:  Recent Labs Lab 06/25/16 1829 06/25/16 1842 06/26/16 2025 06/27/16 0543 06/28/16 0543 06/29/16 0442  WBC 10.2  --  7.0 4.1 6.5 7.0  NEUTROABS 8.8*  --   --   --   --   --   HGB 10.9* 10.9* 10.3* 10.3* 9.5* 9.3*  HCT 33.7* 32.0* 34.0* 31.9* 30.8* 28.9*  MCV 94.7  --  98.0 94.7 97.8 95.7  PLT 142*  --  122* 114* 124* 124*   Cardiac Enzymes:  Recent Labs Lab 06/25/16 2115  CKTOTAL 63   BNP: BNP (last 3 results)  Recent Labs  06/25/16 2115  BNP 166.0*     Signed:  Barton Dubois MD.  [facility] 06/29/2016, 1:56 PM

## 2016-06-29 NOTE — Care Management Note (Signed)
Case Management Note  Patient Details  Name: Desiree [name] MRN: [id number] Date of Birth: 08-13-1924  Subjective/Objective: Pt admitted with fx right hip                   Action/Plan: Plan to discharge SNF/[facility]  Expected Discharge Date:  06/29/16               Expected Discharge Plan:  [place]  In-House Referral:  Clinical Social Work  Discharge planning Services  CM Consult  Post Acute Care Choice:    Choice offered to:     DME Arranged:    DME Agency:     HH Arranged:    [place] Agency:     Status of Service:  Completed, signed off  If discussed at [employer], dates discussed:    Additional CommentsPurcell Mouton, RN 06/29/2016, 11:37 AM

## 2016-06-29 NOTE — Progress Notes (Signed)
CSW received insurance authorization for patient from [facility]. Insurance authorization number is [id number]. CSW notified SNF.   Abundio Miu, [place] Social Worker [facility] Cell#: [phone]

## 2016-06-29 NOTE — Progress Notes (Signed)
Chaplain stopped in to visit with patient while rounding the floor.  Patient sitting up in chair and daughter is in the room but behind the room working on her computer.  Daughter states that her mother is having a different day today.  Chaplain introduced herself to the patient, the patient said she wasn't doing all that great today.  Patient thanked Chaplain for coming by but said that she didn't have any needs today.  Daughter also thanked patient for coming by.    06/29/16 1416  Clinical Encounter Type  Visited With Patient;Family  Visit Type Initial;Psychological support;Spiritual support;Social support  Stress Factors  Patient Stress Factors Health changes  Family Stress Factors Health changes

## 2016-06-29 NOTE — Progress Notes (Signed)
CSW called [facility] to ensure that pt was on the [facility] list to be picked up.  EMS did not have a copy of the [employer], so CSW had pt added to the EMS list for pick up.  Creta Levin, LCSW Evening/ED Coverage [phone]

## 2016-06-29 NOTE — Care Management Important Message (Signed)
Important Message  Patient Details  Name: Desiree [name] MRN: [id number] Date of Birth: Oct 11, 1924   Medicare Important Message Given:  Yes    Kerin Salen 06/29/2016, 11:36 AMImportant Message  Patient Details  Name: Desiree [name] MRN: [id number] Date of Birth: 11/04/1924   Medicare Important Message Given:  Yes    Kerin Salen 06/29/2016, 11:36 AM

## 2016-06-29 NOTE — Progress Notes (Signed)
[place] for warfarin Indication: atrial fibrillation and pulmonary embolus  Allergies  Allergen Reactions  . Celebrex [Celecoxib] Other (See Comments)    Reaction:  Anemia/leukopenia  . Diovan [Valsartan] Other (See Comments)    Reaction:  Increases potassium levels/acute renal failure     Patient Measurements: Height: 5\' 2"  (157.5 cm) Weight: 109 lb 5.6 oz (49.6 kg) IBW/kg (Calculated) : 50.1 Heparin Dosing Weight: 52.7kg  Vital Signs: Temp: 98.5 F (36.9 C) (05/07 0532) Temp Source: Oral (05/07 0532) BP: 134/58 (05/07 1042) Pulse Rate: 82 (05/07 1042)  Labs:  Recent Labs  06/26/16 2025 06/27/16 0543 06/28/16 0543 06/29/16 0442  HGB 10.3* 10.3* 9.5* 9.3*  HCT 34.0* 31.9* 30.8* 28.9*  PLT 122* 114* 124* 124*  LABPROT  --  22.9* 24.4* 23.6*  INR  --  1.99 2.16 2.06  CREATININE 0.79  --   --   --     Estimated Creatinine Clearance: 37.3 mL/min (by C-G formula based on SCr of 0.79 mg/dL).   Assessment: 45 yof presented to the ED after a fall now s/p hip fx pinning on 5/4 PM. Lovenox 40mg  daily started POD1 now to restart warfarin per Rx.    Baseline INR therapeutic on warfarin  Prior anticoagulation: Warfarin 2.5 mg daily; LD 5/3  Significant events:  Started on heparin preoperatively, no vitamin K given pre-procedure  Resume Lovenox ppx and warfarin 5/5  Today, 06/29/2016:  CBC: Hgb lower d/t ABLA from procedure; stable today. Plt slightly low but stable  INR remains low end of therapeutic  Major drug interactions: none  No bleeding issues per nursing  Eating 50% of meals  Goal of Therapy: INR 2-3  Plan:  Repeat warfarin 2.5 mg PO x1 today  Daily INR  CBC at least q72 hr while on warfarin  Monitor for signs of bleeding or thrombosis   Reuel Boom, PharmD Pager: [phone] 06/29/2016, 12:00 PM

## 2016-06-29 NOTE — Clinical Social Work Placement (Signed)
Patient received and accepted bed offer at [facility]. Patient to be transported via [place],  [place]  NOTE  Date:  06/29/2016  Patient Details  Name: Desiree [name] MRN: [id number] Date of Birth: 1924-12-22  Clinical Social Work is seeking post-discharge placement for this patient at the [place] level of care (*CSW will initial, date and re-position this form in  chart as items are completed):  Yes   Patient/family provided with [place] Work Department's list of facilities offering this level of care within the geographic area requested by the patient (or if unable, by the patient's family).  Yes   Patient/family informed of their freedom to choose among providers that offer the needed level of care, that participate in Medicare, Medicaid or managed care program needed by the patient, have an available bed and are willing to accept the patient.  Yes   Patient/family informed of [facility]'s ownership interest in [facility] and [facility], as well as of the fact that they are under no obligation to receive care at these facilities.  PASRR submitted to EDS on       PASRR number received on       Existing PASRR number confirmed on 06/28/16     FL2 transmitted to all facilities in geographic area requested by pt/family on 06/28/16     FL2 transmitted to all facilities within larger geographic area on       Patient informed that his/her managed care company has contracts with or will negotiate with certain facilities, including the following:        Yes   Patient/family informed of bed offers received.  Patient chooses bed at [facility]     Physician recommends and patient chooses bed at      Patient to be transferred to [facility] on 06/29/16.  Patient to be transferred to facility by [facility]     Patient family notified on  of transfer.  Name of family member notified:       PHYSICIAN     Additional Comment:     _______________________________________________ Burnis Medin, LCSW 06/29/2016, 11:25 AM

## 2016-06-30 DIAGNOSIS — I10 Essential (primary) hypertension: Secondary | ICD-10-CM | POA: Diagnosis not present

## 2016-06-30 DIAGNOSIS — M6281 Muscle weakness (generalized): Secondary | ICD-10-CM | POA: Diagnosis not present

## 2016-06-30 DIAGNOSIS — S72001D Fracture of unspecified part of neck of right femur, subsequent encounter for closed fracture with routine healing: Secondary | ICD-10-CM | POA: Diagnosis not present

## 2016-06-30 DIAGNOSIS — I4891 Unspecified atrial fibrillation: Secondary | ICD-10-CM | POA: Diagnosis not present

## 2016-07-04 DIAGNOSIS — R4182 Altered mental status, unspecified: Secondary | ICD-10-CM | POA: Diagnosis not present

## 2016-07-07 ENCOUNTER — Ambulatory Visit: Payer: PPO | Admitting: Sports Medicine

## 2016-07-07 ENCOUNTER — Encounter: Payer: Self-pay | Admitting: Sports Medicine

## 2016-07-07 DIAGNOSIS — M79674 Pain in right toe(s): Secondary | ICD-10-CM

## 2016-07-07 DIAGNOSIS — L600 Ingrowing nail: Secondary | ICD-10-CM

## 2016-07-07 DIAGNOSIS — B351 Tinea unguium: Secondary | ICD-10-CM | POA: Diagnosis not present

## 2016-07-07 DIAGNOSIS — M79675 Pain in left toe(s): Secondary | ICD-10-CM
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
## 2016-07-15 DIAGNOSIS — G44309 Post-traumatic headache, unspecified, not intractable: Secondary | ICD-10-CM | POA: Diagnosis not present

## 2016-07-15 DIAGNOSIS — I119 Hypertensive heart disease without heart failure: Secondary | ICD-10-CM | POA: Diagnosis not present

## 2016-07-15 DIAGNOSIS — E039 Hypothyroidism, unspecified: Secondary | ICD-10-CM | POA: Diagnosis not present

## 2016-07-15 DIAGNOSIS — Z7901 Long term (current) use of anticoagulants: Secondary | ICD-10-CM | POA: Diagnosis not present

## 2016-07-15 DIAGNOSIS — I6529 Occlusion and stenosis of unspecified carotid artery: Secondary | ICD-10-CM | POA: Diagnosis not present

## 2016-07-15 DIAGNOSIS — I480 Paroxysmal atrial fibrillation: Secondary | ICD-10-CM | POA: Diagnosis not present

## 2016-07-15 DIAGNOSIS — E784 Other hyperlipidemia: Secondary | ICD-10-CM | POA: Diagnosis not present

## 2016-07-15 DIAGNOSIS — I511 Rupture of chordae tendineae, not elsewhere classified: Secondary | ICD-10-CM | POA: Diagnosis not present

## 2016-07-15 DIAGNOSIS — I739 Peripheral vascular disease, unspecified: Secondary | ICD-10-CM | POA: Diagnosis not present

## 2016-07-15 DIAGNOSIS — Z954 Presence of other heart-valve replacement: Secondary | ICD-10-CM | POA: Diagnosis not present

## 2016-07-17 DIAGNOSIS — M1711 Unilateral primary osteoarthritis, right knee: Secondary | ICD-10-CM | POA: Diagnosis not present

## 2016-07-23 DIAGNOSIS — Z85828 Personal history of other malignant neoplasm of skin: Secondary | ICD-10-CM | POA: Diagnosis not present

## 2016-07-23 DIAGNOSIS — Z87440 Personal history of urinary (tract) infections: Secondary | ICD-10-CM | POA: Diagnosis not present

## 2016-07-23 DIAGNOSIS — D649 Anemia, unspecified: Secondary | ICD-10-CM | POA: Diagnosis not present

## 2016-07-23 DIAGNOSIS — M150 Primary generalized (osteo)arthritis: Secondary | ICD-10-CM | POA: Diagnosis not present

## 2016-07-23 DIAGNOSIS — W19XXXD Unspecified fall, subsequent encounter: Secondary | ICD-10-CM | POA: Diagnosis not present

## 2016-07-23 DIAGNOSIS — M5136 Other intervertebral disc degeneration, lumbar region: Secondary | ICD-10-CM | POA: Diagnosis not present

## 2016-07-23 DIAGNOSIS — I5032 Chronic diastolic (congestive) heart failure: Secondary | ICD-10-CM | POA: Diagnosis not present

## 2016-07-23 DIAGNOSIS — S72001D Fracture of unspecified part of neck of right femur, subsequent encounter for closed fracture with routine healing: Secondary | ICD-10-CM | POA: Diagnosis not present

## 2016-07-23 DIAGNOSIS — Z86711 Personal history of pulmonary embolism: Secondary | ICD-10-CM | POA: Diagnosis not present

## 2016-07-23 DIAGNOSIS — I4891 Unspecified atrial fibrillation: Secondary | ICD-10-CM | POA: Diagnosis not present

## 2016-07-23 DIAGNOSIS — I110 Hypertensive heart disease with heart failure: Secondary | ICD-10-CM | POA: Diagnosis not present

## 2016-07-23 DIAGNOSIS — Z9181 History of falling: Secondary | ICD-10-CM | POA: Diagnosis not present

## 2016-07-23 DIAGNOSIS — F329 Major depressive disorder, single episode, unspecified: Secondary | ICD-10-CM | POA: Diagnosis not present

## 2016-07-25 NOTE — Addendum Note (Signed)
Addendum  created 07/25/16 0881 by Duane Boston, MD   Sign clinical note

## 2016-07-27 DIAGNOSIS — S72001D Fracture of unspecified part of neck of right femur, subsequent encounter for closed fracture with routine healing: Secondary | ICD-10-CM | POA: Diagnosis not present

## 2016-07-27 DIAGNOSIS — M150 Primary generalized (osteo)arthritis: Secondary | ICD-10-CM | POA: Diagnosis not present

## 2016-07-27 DIAGNOSIS — Z87440 Personal history of urinary (tract) infections: Secondary | ICD-10-CM | POA: Diagnosis not present

## 2016-07-27 DIAGNOSIS — I4891 Unspecified atrial fibrillation: Secondary | ICD-10-CM | POA: Diagnosis not present

## 2016-07-27 DIAGNOSIS — M5136 Other intervertebral disc degeneration, lumbar region: Secondary | ICD-10-CM | POA: Diagnosis not present

## 2016-07-27 DIAGNOSIS — F329 Major depressive disorder, single episode, unspecified: Secondary | ICD-10-CM | POA: Diagnosis not present

## 2016-07-27 DIAGNOSIS — D649 Anemia, unspecified: Secondary | ICD-10-CM | POA: Diagnosis not present

## 2016-07-27 DIAGNOSIS — Z86711 Personal history of pulmonary embolism: Secondary | ICD-10-CM | POA: Diagnosis not present

## 2016-07-27 DIAGNOSIS — W19XXXD Unspecified fall, subsequent encounter: Secondary | ICD-10-CM | POA: Diagnosis not present

## 2016-07-27 DIAGNOSIS — I110 Hypertensive heart disease with heart failure: Secondary | ICD-10-CM | POA: Diagnosis not present

## 2016-07-27 DIAGNOSIS — Z9181 History of falling: Secondary | ICD-10-CM | POA: Diagnosis not present

## 2016-07-27 DIAGNOSIS — Z85828 Personal history of other malignant neoplasm of skin: Secondary | ICD-10-CM | POA: Diagnosis not present

## 2016-07-27 DIAGNOSIS — I5032 Chronic diastolic (congestive) heart failure: Secondary | ICD-10-CM | POA: Diagnosis not present

## 2016-08-03 DIAGNOSIS — I4891 Unspecified atrial fibrillation: Secondary | ICD-10-CM | POA: Diagnosis not present

## 2016-08-03 DIAGNOSIS — I110 Hypertensive heart disease with heart failure: Secondary | ICD-10-CM | POA: Diagnosis not present

## 2016-08-03 DIAGNOSIS — F329 Major depressive disorder, single episode, unspecified: Secondary | ICD-10-CM | POA: Diagnosis not present

## 2016-08-03 DIAGNOSIS — Z87440 Personal history of urinary (tract) infections: Secondary | ICD-10-CM | POA: Diagnosis not present

## 2016-08-03 DIAGNOSIS — M5136 Other intervertebral disc degeneration, lumbar region: Secondary | ICD-10-CM | POA: Diagnosis not present

## 2016-08-03 DIAGNOSIS — M150 Primary generalized (osteo)arthritis: Secondary | ICD-10-CM | POA: Diagnosis not present

## 2016-08-03 DIAGNOSIS — W19XXXD Unspecified fall, subsequent encounter: Secondary | ICD-10-CM | POA: Diagnosis not present

## 2016-08-03 DIAGNOSIS — Z9181 History of falling: Secondary | ICD-10-CM | POA: Diagnosis not present

## 2016-08-03 DIAGNOSIS — Z86711 Personal history of pulmonary embolism: Secondary | ICD-10-CM | POA: Diagnosis not present

## 2016-08-03 DIAGNOSIS — S72001D Fracture of unspecified part of neck of right femur, subsequent encounter for closed fracture with routine healing: Secondary | ICD-10-CM | POA: Diagnosis not present

## 2016-08-03 DIAGNOSIS — D649 Anemia, unspecified: Secondary | ICD-10-CM | POA: Diagnosis not present

## 2016-08-03 DIAGNOSIS — Z85828 Personal history of other malignant neoplasm of skin: Secondary | ICD-10-CM | POA: Diagnosis not present

## 2016-08-03 DIAGNOSIS — I5032 Chronic diastolic (congestive) heart failure: Secondary | ICD-10-CM | POA: Diagnosis not present

## 2016-08-10 DIAGNOSIS — S72001D Fracture of unspecified part of neck of right femur, subsequent encounter for closed fracture with routine healing: Secondary | ICD-10-CM | POA: Diagnosis not present

## 2016-08-10 DIAGNOSIS — M5136 Other intervertebral disc degeneration, lumbar region: Secondary | ICD-10-CM | POA: Diagnosis not present

## 2016-08-10 DIAGNOSIS — Z86711 Personal history of pulmonary embolism: Secondary | ICD-10-CM | POA: Diagnosis not present

## 2016-08-10 DIAGNOSIS — W19XXXD Unspecified fall, subsequent encounter: Secondary | ICD-10-CM | POA: Diagnosis not present

## 2016-08-10 DIAGNOSIS — Z87440 Personal history of urinary (tract) infections: Secondary | ICD-10-CM | POA: Diagnosis not present

## 2016-08-10 DIAGNOSIS — I4891 Unspecified atrial fibrillation: Secondary | ICD-10-CM | POA: Diagnosis not present

## 2016-08-10 DIAGNOSIS — I5032 Chronic diastolic (congestive) heart failure: Secondary | ICD-10-CM | POA: Diagnosis not present

## 2016-08-10 DIAGNOSIS — D649 Anemia, unspecified: Secondary | ICD-10-CM | POA: Diagnosis not present

## 2016-08-10 DIAGNOSIS — F329 Major depressive disorder, single episode, unspecified: Secondary | ICD-10-CM | POA: Diagnosis not present

## 2016-08-10 DIAGNOSIS — Z9181 History of falling: Secondary | ICD-10-CM | POA: Diagnosis not present

## 2016-08-10 DIAGNOSIS — I110 Hypertensive heart disease with heart failure: Secondary | ICD-10-CM | POA: Diagnosis not present

## 2016-08-10 DIAGNOSIS — M150 Primary generalized (osteo)arthritis: Secondary | ICD-10-CM | POA: Diagnosis not present

## 2016-08-10 DIAGNOSIS — Z85828 Personal history of other malignant neoplasm of skin: Secondary | ICD-10-CM | POA: Diagnosis not present

## 2016-08-11 DIAGNOSIS — M5136 Other intervertebral disc degeneration, lumbar region: Secondary | ICD-10-CM | POA: Diagnosis not present

## 2016-08-11 DIAGNOSIS — D649 Anemia, unspecified: Secondary | ICD-10-CM | POA: Diagnosis not present

## 2016-08-11 DIAGNOSIS — M150 Primary generalized (osteo)arthritis: Secondary | ICD-10-CM | POA: Diagnosis not present

## 2016-08-11 DIAGNOSIS — Z9181 History of falling: Secondary | ICD-10-CM | POA: Diagnosis not present

## 2016-08-11 DIAGNOSIS — Z86711 Personal history of pulmonary embolism: Secondary | ICD-10-CM | POA: Diagnosis not present

## 2016-08-11 DIAGNOSIS — I5032 Chronic diastolic (congestive) heart failure: Secondary | ICD-10-CM | POA: Diagnosis not present

## 2016-08-11 DIAGNOSIS — W19XXXD Unspecified fall, subsequent encounter: Secondary | ICD-10-CM | POA: Diagnosis not present

## 2016-08-11 DIAGNOSIS — I4891 Unspecified atrial fibrillation: Secondary | ICD-10-CM | POA: Diagnosis not present

## 2016-08-11 DIAGNOSIS — F329 Major depressive disorder, single episode, unspecified: Secondary | ICD-10-CM | POA: Diagnosis not present

## 2016-08-11 DIAGNOSIS — Z87440 Personal history of urinary (tract) infections: Secondary | ICD-10-CM | POA: Diagnosis not present

## 2016-08-11 DIAGNOSIS — S72001D Fracture of unspecified part of neck of right femur, subsequent encounter for closed fracture with routine healing: Secondary | ICD-10-CM | POA: Diagnosis not present

## 2016-08-11 DIAGNOSIS — I110 Hypertensive heart disease with heart failure: Secondary | ICD-10-CM | POA: Diagnosis not present

## 2016-08-11 DIAGNOSIS — Z85828 Personal history of other malignant neoplasm of skin: Secondary | ICD-10-CM | POA: Diagnosis not present

## 2016-08-12 DIAGNOSIS — I6529 Occlusion and stenosis of unspecified carotid artery: Secondary | ICD-10-CM | POA: Diagnosis not present

## 2016-08-12 DIAGNOSIS — I511 Rupture of chordae tendineae, not elsewhere classified: Secondary | ICD-10-CM | POA: Diagnosis not present

## 2016-08-12 DIAGNOSIS — I739 Peripheral vascular disease, unspecified: Secondary | ICD-10-CM | POA: Diagnosis not present

## 2016-08-12 DIAGNOSIS — G44309 Post-traumatic headache, unspecified, not intractable: Secondary | ICD-10-CM | POA: Diagnosis not present

## 2016-08-12 DIAGNOSIS — E039 Hypothyroidism, unspecified: Secondary | ICD-10-CM | POA: Diagnosis not present

## 2016-08-12 DIAGNOSIS — Z954 Presence of other heart-valve replacement: Secondary | ICD-10-CM | POA: Diagnosis not present

## 2016-08-12 DIAGNOSIS — Z7901 Long term (current) use of anticoagulants: Secondary | ICD-10-CM | POA: Diagnosis not present

## 2016-08-12 DIAGNOSIS — I119 Hypertensive heart disease without heart failure: Secondary | ICD-10-CM | POA: Diagnosis not present

## 2016-08-12 DIAGNOSIS — I480 Paroxysmal atrial fibrillation: Secondary | ICD-10-CM | POA: Diagnosis not present

## 2016-08-12 DIAGNOSIS — E784 Other hyperlipidemia: Secondary | ICD-10-CM | POA: Diagnosis not present

## 2016-08-13 DIAGNOSIS — Z85828 Personal history of other malignant neoplasm of skin: Secondary | ICD-10-CM | POA: Diagnosis not present

## 2016-08-13 DIAGNOSIS — L570 Actinic keratosis: Secondary | ICD-10-CM | POA: Diagnosis not present

## 2016-08-13 DIAGNOSIS — D1801 Hemangioma of skin and subcutaneous tissue: Secondary | ICD-10-CM | POA: Diagnosis not present

## 2016-08-13 DIAGNOSIS — L814 Other melanin hyperpigmentation: Secondary | ICD-10-CM | POA: Diagnosis not present

## 2016-08-13 DIAGNOSIS — L821 Other seborrheic keratosis: Secondary | ICD-10-CM | POA: Diagnosis not present

## 2016-08-17 DIAGNOSIS — W19XXXD Unspecified fall, subsequent encounter: Secondary | ICD-10-CM | POA: Diagnosis not present

## 2016-08-17 DIAGNOSIS — Z86711 Personal history of pulmonary embolism: Secondary | ICD-10-CM | POA: Diagnosis not present

## 2016-08-17 DIAGNOSIS — I110 Hypertensive heart disease with heart failure: Secondary | ICD-10-CM | POA: Diagnosis not present

## 2016-08-17 DIAGNOSIS — Z85828 Personal history of other malignant neoplasm of skin: Secondary | ICD-10-CM | POA: Diagnosis not present

## 2016-08-17 DIAGNOSIS — S72001D Fracture of unspecified part of neck of right femur, subsequent encounter for closed fracture with routine healing: Secondary | ICD-10-CM | POA: Diagnosis not present

## 2016-08-17 DIAGNOSIS — D649 Anemia, unspecified: Secondary | ICD-10-CM | POA: Diagnosis not present

## 2016-08-17 DIAGNOSIS — M5136 Other intervertebral disc degeneration, lumbar region: Secondary | ICD-10-CM | POA: Diagnosis not present

## 2016-08-17 DIAGNOSIS — F329 Major depressive disorder, single episode, unspecified: Secondary | ICD-10-CM | POA: Diagnosis not present

## 2016-08-17 DIAGNOSIS — Z87440 Personal history of urinary (tract) infections: Secondary | ICD-10-CM | POA: Diagnosis not present

## 2016-08-17 DIAGNOSIS — Z9181 History of falling: Secondary | ICD-10-CM | POA: Diagnosis not present

## 2016-08-17 DIAGNOSIS — I5032 Chronic diastolic (congestive) heart failure: Secondary | ICD-10-CM | POA: Diagnosis not present

## 2016-08-17 DIAGNOSIS — I4891 Unspecified atrial fibrillation: Secondary | ICD-10-CM | POA: Diagnosis not present

## 2016-08-17 DIAGNOSIS — M150 Primary generalized (osteo)arthritis: Secondary | ICD-10-CM | POA: Diagnosis not present

## 2016-08-19 DIAGNOSIS — I5032 Chronic diastolic (congestive) heart failure: Secondary | ICD-10-CM | POA: Diagnosis not present

## 2016-08-19 DIAGNOSIS — W19XXXD Unspecified fall, subsequent encounter: Secondary | ICD-10-CM | POA: Diagnosis not present

## 2016-08-19 DIAGNOSIS — I4891 Unspecified atrial fibrillation: Secondary | ICD-10-CM | POA: Diagnosis not present

## 2016-08-19 DIAGNOSIS — I110 Hypertensive heart disease with heart failure: Secondary | ICD-10-CM | POA: Diagnosis not present

## 2016-08-19 DIAGNOSIS — F329 Major depressive disorder, single episode, unspecified: Secondary | ICD-10-CM | POA: Diagnosis not present

## 2016-08-19 DIAGNOSIS — S72001D Fracture of unspecified part of neck of right femur, subsequent encounter for closed fracture with routine healing: Secondary | ICD-10-CM | POA: Diagnosis not present

## 2016-08-19 DIAGNOSIS — Z86711 Personal history of pulmonary embolism: Secondary | ICD-10-CM | POA: Diagnosis not present

## 2016-08-19 DIAGNOSIS — M5136 Other intervertebral disc degeneration, lumbar region: Secondary | ICD-10-CM | POA: Diagnosis not present

## 2016-08-19 DIAGNOSIS — D649 Anemia, unspecified: Secondary | ICD-10-CM | POA: Diagnosis not present

## 2016-08-19 DIAGNOSIS — M150 Primary generalized (osteo)arthritis: Secondary | ICD-10-CM | POA: Diagnosis not present

## 2016-08-19 DIAGNOSIS — Z9181 History of falling: Secondary | ICD-10-CM | POA: Diagnosis not present

## 2016-08-19 DIAGNOSIS — Z87440 Personal history of urinary (tract) infections: Secondary | ICD-10-CM | POA: Diagnosis not present

## 2016-08-19 DIAGNOSIS — Z85828 Personal history of other malignant neoplasm of skin: Secondary | ICD-10-CM | POA: Diagnosis not present

## 2016-08-24 DIAGNOSIS — D649 Anemia, unspecified: Secondary | ICD-10-CM | POA: Diagnosis not present

## 2016-08-24 DIAGNOSIS — Z87440 Personal history of urinary (tract) infections: Secondary | ICD-10-CM | POA: Diagnosis not present

## 2016-08-24 DIAGNOSIS — M150 Primary generalized (osteo)arthritis: Secondary | ICD-10-CM | POA: Diagnosis not present

## 2016-08-24 DIAGNOSIS — W19XXXD Unspecified fall, subsequent encounter: Secondary | ICD-10-CM | POA: Diagnosis not present

## 2016-08-24 DIAGNOSIS — Z9181 History of falling: Secondary | ICD-10-CM | POA: Diagnosis not present

## 2016-08-24 DIAGNOSIS — S72001D Fracture of unspecified part of neck of right femur, subsequent encounter for closed fracture with routine healing: Secondary | ICD-10-CM | POA: Diagnosis not present

## 2016-08-24 DIAGNOSIS — I5032 Chronic diastolic (congestive) heart failure: Secondary | ICD-10-CM | POA: Diagnosis not present

## 2016-08-24 DIAGNOSIS — Z85828 Personal history of other malignant neoplasm of skin: Secondary | ICD-10-CM | POA: Diagnosis not present

## 2016-08-24 DIAGNOSIS — I110 Hypertensive heart disease with heart failure: Secondary | ICD-10-CM | POA: Diagnosis not present

## 2016-08-24 DIAGNOSIS — I4891 Unspecified atrial fibrillation: Secondary | ICD-10-CM | POA: Diagnosis not present

## 2016-08-24 DIAGNOSIS — F329 Major depressive disorder, single episode, unspecified: Secondary | ICD-10-CM | POA: Diagnosis not present

## 2016-08-24 DIAGNOSIS — M5136 Other intervertebral disc degeneration, lumbar region: Secondary | ICD-10-CM | POA: Diagnosis not present

## 2016-08-24 DIAGNOSIS — Z7901 Long term (current) use of anticoagulants: Secondary | ICD-10-CM | POA: Diagnosis not present

## 2016-08-24 DIAGNOSIS — Z86711 Personal history of pulmonary embolism: Secondary | ICD-10-CM | POA: Diagnosis not present

## 2016-08-25 ENCOUNTER — Ambulatory Visit (INDEPENDENT_AMBULATORY_CARE_PROVIDER_SITE_OTHER): Payer: PPO | Admitting: Sports Medicine

## 2016-08-25 DIAGNOSIS — B351 Tinea unguium: Secondary | ICD-10-CM | POA: Diagnosis not present

## 2016-08-25 DIAGNOSIS — M79675 Pain in left toe(s): Secondary | ICD-10-CM | POA: Diagnosis not present

## 2016-08-25 DIAGNOSIS — M79674 Pain in right toe(s): Secondary | ICD-10-CM | POA: Diagnosis not present

## 2016-08-25 DIAGNOSIS — L600 Ingrowing nail: Secondary | ICD-10-CM
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
## 2016-08-28 DIAGNOSIS — F329 Major depressive disorder, single episode, unspecified: Secondary | ICD-10-CM | POA: Diagnosis not present

## 2016-08-28 DIAGNOSIS — Z85828 Personal history of other malignant neoplasm of skin: Secondary | ICD-10-CM | POA: Diagnosis not present

## 2016-08-28 DIAGNOSIS — Z9181 History of falling: Secondary | ICD-10-CM | POA: Diagnosis not present

## 2016-08-28 DIAGNOSIS — S72001D Fracture of unspecified part of neck of right femur, subsequent encounter for closed fracture with routine healing: Secondary | ICD-10-CM | POA: Diagnosis not present

## 2016-08-28 DIAGNOSIS — M150 Primary generalized (osteo)arthritis: Secondary | ICD-10-CM | POA: Diagnosis not present

## 2016-08-28 DIAGNOSIS — W19XXXD Unspecified fall, subsequent encounter: Secondary | ICD-10-CM | POA: Diagnosis not present

## 2016-08-28 DIAGNOSIS — M5136 Other intervertebral disc degeneration, lumbar region: Secondary | ICD-10-CM | POA: Diagnosis not present

## 2016-08-28 DIAGNOSIS — Z86711 Personal history of pulmonary embolism: Secondary | ICD-10-CM | POA: Diagnosis not present

## 2016-08-28 DIAGNOSIS — W19XXXA Unspecified fall, initial encounter: Secondary | ICD-10-CM | POA: Diagnosis not present

## 2016-08-28 DIAGNOSIS — D649 Anemia, unspecified: Secondary | ICD-10-CM | POA: Diagnosis not present

## 2016-08-28 DIAGNOSIS — Z7901 Long term (current) use of anticoagulants: Secondary | ICD-10-CM | POA: Diagnosis not present

## 2016-08-28 DIAGNOSIS — I4891 Unspecified atrial fibrillation: Secondary | ICD-10-CM | POA: Diagnosis not present

## 2016-08-28 DIAGNOSIS — Z87440 Personal history of urinary (tract) infections: Secondary | ICD-10-CM | POA: Diagnosis not present

## 2016-08-28 DIAGNOSIS — I110 Hypertensive heart disease with heart failure: Secondary | ICD-10-CM | POA: Diagnosis not present

## 2016-08-28 DIAGNOSIS — I5032 Chronic diastolic (congestive) heart failure: Secondary | ICD-10-CM | POA: Diagnosis not present

## 2016-09-07 ENCOUNTER — Telehealth: Payer: Self-pay | Admitting: Neurology

## 2016-09-07 NOTE — Telephone Encounter (Signed)
Jaynie Hitch Daughter [phone]  [name] called to say she has got some more forms that goes with what she has already brought by. She will drop off in the morning.

## 2016-09-09 DIAGNOSIS — Z7901 Long term (current) use of anticoagulants: Secondary | ICD-10-CM | POA: Diagnosis not present

## 2016-09-09 DIAGNOSIS — E784 Other hyperlipidemia: Secondary | ICD-10-CM | POA: Diagnosis not present

## 2016-09-09 DIAGNOSIS — I119 Hypertensive heart disease without heart failure: Secondary | ICD-10-CM | POA: Diagnosis not present

## 2016-09-09 DIAGNOSIS — I511 Rupture of chordae tendineae, not elsewhere classified: Secondary | ICD-10-CM | POA: Diagnosis not present

## 2016-09-09 DIAGNOSIS — I480 Paroxysmal atrial fibrillation: Secondary | ICD-10-CM | POA: Diagnosis not present

## 2016-09-09 DIAGNOSIS — Z954 Presence of other heart-valve replacement: Secondary | ICD-10-CM | POA: Diagnosis not present

## 2016-09-09 DIAGNOSIS — G44309 Post-traumatic headache, unspecified, not intractable: Secondary | ICD-10-CM | POA: Diagnosis not present

## 2016-09-09 DIAGNOSIS — E039 Hypothyroidism, unspecified: Secondary | ICD-10-CM | POA: Diagnosis not present

## 2016-09-09 DIAGNOSIS — I739 Peripheral vascular disease, unspecified: Secondary | ICD-10-CM | POA: Diagnosis not present

## 2016-09-09 DIAGNOSIS — I6529 Occlusion and stenosis of unspecified carotid artery: Secondary | ICD-10-CM | POA: Diagnosis not present

## 2016-09-11 DIAGNOSIS — H43823 Vitreomacular adhesion, bilateral: Secondary | ICD-10-CM | POA: Diagnosis not present

## 2016-09-11 DIAGNOSIS — H35373 Puckering of macula, bilateral: Secondary | ICD-10-CM | POA: Diagnosis not present

## 2016-09-11 DIAGNOSIS — H43813 Vitreous degeneration, bilateral: Secondary | ICD-10-CM | POA: Diagnosis not present

## 2016-09-15 DIAGNOSIS — M25511 Pain in right shoulder: Secondary | ICD-10-CM | POA: Diagnosis not present

## 2016-09-15 DIAGNOSIS — S72001D Fracture of unspecified part of neck of right femur, subsequent encounter for closed fracture with routine healing: Secondary | ICD-10-CM | POA: Diagnosis not present

## 2016-09-15 DIAGNOSIS — G8929 Other chronic pain: Secondary | ICD-10-CM | POA: Diagnosis not present

## 2016-09-23 DIAGNOSIS — I6529 Occlusion and stenosis of unspecified carotid artery: Secondary | ICD-10-CM | POA: Diagnosis not present

## 2016-09-23 DIAGNOSIS — G44309 Post-traumatic headache, unspecified, not intractable: Secondary | ICD-10-CM | POA: Diagnosis not present

## 2016-09-23 DIAGNOSIS — E039 Hypothyroidism, unspecified: Secondary | ICD-10-CM | POA: Diagnosis not present

## 2016-09-23 DIAGNOSIS — Z954 Presence of other heart-valve replacement: Secondary | ICD-10-CM | POA: Diagnosis not present

## 2016-09-23 DIAGNOSIS — I511 Rupture of chordae tendineae, not elsewhere classified: Secondary | ICD-10-CM | POA: Diagnosis not present

## 2016-09-23 DIAGNOSIS — I739 Peripheral vascular disease, unspecified: Secondary | ICD-10-CM | POA: Diagnosis not present

## 2016-09-23 DIAGNOSIS — Z7901 Long term (current) use of anticoagulants: Secondary | ICD-10-CM | POA: Diagnosis not present

## 2016-09-23 DIAGNOSIS — I480 Paroxysmal atrial fibrillation: Secondary | ICD-10-CM | POA: Diagnosis not present

## 2016-09-23 DIAGNOSIS — E784 Other hyperlipidemia: Secondary | ICD-10-CM | POA: Diagnosis not present

## 2016-09-23 DIAGNOSIS — I119 Hypertensive heart disease without heart failure: Secondary | ICD-10-CM | POA: Diagnosis not present

## 2016-09-30 DIAGNOSIS — E785 Hyperlipidemia, unspecified: Secondary | ICD-10-CM | POA: Diagnosis not present

## 2016-09-30 DIAGNOSIS — E559 Vitamin D deficiency, unspecified: Secondary | ICD-10-CM | POA: Diagnosis not present

## 2016-09-30 DIAGNOSIS — Z0000 Encounter for general adult medical examination without abnormal findings: Secondary | ICD-10-CM | POA: Diagnosis not present

## 2016-09-30 DIAGNOSIS — Z136 Encounter for screening for cardiovascular disorders: Secondary | ICD-10-CM | POA: Diagnosis not present

## 2016-09-30 DIAGNOSIS — D649 Anemia, unspecified: Secondary | ICD-10-CM | POA: Diagnosis not present

## 2016-09-30 DIAGNOSIS — E039 Hypothyroidism, unspecified: Secondary | ICD-10-CM | POA: Diagnosis not present

## 2016-10-01 DIAGNOSIS — R32 Unspecified urinary incontinence: Secondary | ICD-10-CM | POA: Diagnosis not present

## 2016-10-01 DIAGNOSIS — M130 Polyarthritis, unspecified: Secondary | ICD-10-CM | POA: Diagnosis not present

## 2016-10-01 DIAGNOSIS — Z0000 Encounter for general adult medical examination without abnormal findings: Secondary | ICD-10-CM | POA: Diagnosis not present

## 2016-10-01 DIAGNOSIS — E559 Vitamin D deficiency, unspecified: Secondary | ICD-10-CM | POA: Diagnosis not present

## 2016-10-01 DIAGNOSIS — D649 Anemia, unspecified: Secondary | ICD-10-CM | POA: Diagnosis not present

## 2016-10-01 DIAGNOSIS — E039 Hypothyroidism, unspecified: Secondary | ICD-10-CM | POA: Diagnosis not present

## 2016-10-01 DIAGNOSIS — E785 Hyperlipidemia, unspecified: Secondary | ICD-10-CM | POA: Diagnosis not present

## 2016-10-01 DIAGNOSIS — I10 Essential (primary) hypertension: Secondary | ICD-10-CM | POA: Diagnosis not present

## 2016-10-06 ENCOUNTER — Encounter: Payer: Self-pay | Admitting: Sports Medicine

## 2016-10-06 ENCOUNTER — Ambulatory Visit (INDEPENDENT_AMBULATORY_CARE_PROVIDER_SITE_OTHER): Payer: PPO | Admitting: Sports Medicine

## 2016-10-06 DIAGNOSIS — L600 Ingrowing nail: Secondary | ICD-10-CM

## 2016-10-06 DIAGNOSIS — M79674 Pain in right toe(s): Secondary | ICD-10-CM

## 2016-10-06 DIAGNOSIS — M79675 Pain in left toe(s): Secondary | ICD-10-CM

## 2016-10-06 DIAGNOSIS — B351 Tinea unguium: Secondary | ICD-10-CM | POA: Diagnosis not present
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
## 2016-10-19 DIAGNOSIS — R609 Edema, unspecified: Secondary | ICD-10-CM | POA: Diagnosis not present

## 2016-10-19 DIAGNOSIS — Z23 Encounter for immunization: Secondary | ICD-10-CM | POA: Diagnosis not present

## 2016-10-19 DIAGNOSIS — K219 Gastro-esophageal reflux disease without esophagitis: Secondary | ICD-10-CM | POA: Diagnosis not present

## 2016-10-19 DIAGNOSIS — I10 Essential (primary) hypertension: Secondary | ICD-10-CM | POA: Diagnosis not present

## 2016-10-21 DIAGNOSIS — E784 Other hyperlipidemia: Secondary | ICD-10-CM | POA: Diagnosis not present

## 2016-10-21 DIAGNOSIS — I739 Peripheral vascular disease, unspecified: Secondary | ICD-10-CM | POA: Diagnosis not present

## 2016-10-21 DIAGNOSIS — I119 Hypertensive heart disease without heart failure: Secondary | ICD-10-CM | POA: Diagnosis not present

## 2016-10-21 DIAGNOSIS — I6529 Occlusion and stenosis of unspecified carotid artery: Secondary | ICD-10-CM | POA: Diagnosis not present

## 2016-10-21 DIAGNOSIS — Z954 Presence of other heart-valve replacement: Secondary | ICD-10-CM | POA: Diagnosis not present

## 2016-10-21 DIAGNOSIS — I480 Paroxysmal atrial fibrillation: Secondary | ICD-10-CM | POA: Diagnosis not present

## 2016-10-21 DIAGNOSIS — I511 Rupture of chordae tendineae, not elsewhere classified: Secondary | ICD-10-CM | POA: Diagnosis not present

## 2016-10-21 DIAGNOSIS — G44309 Post-traumatic headache, unspecified, not intractable: Secondary | ICD-10-CM | POA: Diagnosis not present

## 2016-10-21 DIAGNOSIS — E039 Hypothyroidism, unspecified: Secondary | ICD-10-CM | POA: Diagnosis not present

## 2016-10-21 DIAGNOSIS — Z7901 Long term (current) use of anticoagulants: Secondary | ICD-10-CM | POA: Diagnosis not present

## 2016-10-29 ENCOUNTER — Encounter: Payer: Self-pay | Admitting: Neurology

## 2016-10-29 ENCOUNTER — Ambulatory Visit (INDEPENDENT_AMBULATORY_CARE_PROVIDER_SITE_OTHER): Payer: PPO | Admitting: Neurology

## 2016-10-29 VITALS — BP 144/62 | HR 100 | Ht <= 58 in | Wt 119.3 lb

## 2016-10-29 DIAGNOSIS — G609 Hereditary and idiopathic neuropathy, unspecified: Secondary | ICD-10-CM | POA: Diagnosis not present

## 2016-10-29 DIAGNOSIS — R2681 Unsteadiness on feet: Secondary | ICD-10-CM

## 2016-10-29 DIAGNOSIS — F0150 Vascular dementia without behavioral disturbance: Secondary | ICD-10-CM | POA: Diagnosis not present

## 2016-10-29 DIAGNOSIS — G214 Vascular parkinsonism: Secondary | ICD-10-CM | POA: Diagnosis not present

## 2016-10-29 DIAGNOSIS — F01A0 Vascular dementia, mild, without behavioral disturbance, psychotic disturbance, mood disturbance, and anxiety: Secondary | ICD-10-CM

## 2016-10-29 NOTE — Progress Notes (Signed)
NEUROLOGY FOLLOW UP OFFICE NOTE  Desiree [name] [id number]  HISTORY OF PRESENT ILLNESS: I had the pleasure of seeing Desiree [name] in follow-up in the neurology clinic on 10/29/2016. She was last seen 6 months ago for vascular dementia. She is again accompanied by her daughter who helps supplement the history today. Her daughter had sent [place] a letter last July detailing changes in her mother. Putting on clothes is a struggle, she needs assistance with dressing. A large part of this is the pain in her shoulders. All her ADLs are a struggle, she needs assistance cutting her food but can feed herself. She cannot open jars or containers. Over the past few months, she has had intermittent tremors in her right hand. She has been shuffling her feet walking with the walker. She has a lot of pain from her shoulders, hips, and knees, and gets injections. She has urinary incontinence, not making it to the bedside commode. She changes pads 2-4 times every night. She fell last May and fractured her hip, she sometimes forgets her walker. Now she sits in her chair most of the day. The past couple of weeks, walking has been much harder, she takes slow small steps. Her memory continues to decline, she asks her daughter who is the man staying with her (her son-in-law), sometimes she thinks her daughter is her sister. She does not recall if she ate already. She is not on the Exelon patch any longer, and stopped Memantine as well. She never took the antidepressant, her daughter reports her mood is good most of the time despite everything she is going through.   HPI 10/17/14: This is a 83 yo RH woman with a history of atrial fibrillation, mitral valve repair on chronic anticoagulation with Coumadin, hypertension, hyperlipidemia, right carotid stenosis, chronic back pain, who presented for worsening memory and confusion. The patient feels that she has great memory. She does have problems remembering names or would forget what she  went to do, but denies getting lost driving, no missed bill payments or missed medications. She lives by herself. Her daughter wrote a 2-page letter detailing her concerns which I will summarize as follows. She states her mother is not aware or is unwilling to admit she has issues with processing information, simple instructions, short term memory, or compromised judgement. She only agreed to come to today's visit after 2 recent events. The first occurred last 09/24/14 after she got cortisone injections to her knees. That evening, her daughter called her and noted she was very disoriented and confused. She thought it was a different day and was adamant about it. She was fully dressed and had woken up in her bed, which is unusual for her to take naps in her bed. She did not understand why she had done this. She called her 45 minutes later and seemed better. The next incident occurred on 10/06/14 around 45 minutes after they had left her house, she called and was very upset, confused, reporting bleeding on her face. They came back and found one of her eyeglass lenses on the floor about 5 feet away from her recliner. The frames were bent out of shape. She did not know what had happened. She was brought to the ER where head CT was done which I personally reviewed, showing generalized diffuse atrophy with ventricular dilatation and moderate chronic microvascular disease confluent around the lateral ventricles, stable from 2015. No acute changes seen. She was discharged home back to mental baseline but reporting headaches.  Prior to these events, family has noticed difficulty doing tasks that are familiar to her. She does have trouble with new technology, which is not unusual with regards to the iPhone, but her daughter is concerned that she cannot set the timer on her stove despite repeated lessons and detailed illustration. She constantly loses her keys and pocketbook. She repeats the same stories. A few months ago, the  patient was very concerned that her bottle of hydrocodone had disappeared. They found the pills in her nightstand but could not find the bottle. She has demonstrated poor judgement in many situations and is very moody and depressed but would not admit to it. She is teary, in constant pain. She is fiercely independent and resilient. She does not sleep well. She denies any alcohol intake or significant head injuries. Her younger sister had memory issues. Her brother had seizures. She had a normal birth and early development, no history of CNS infections, febrile seizures, or neurosurgical procedures.   Diagnostic Data: Neuropsychological evaluation at [facility]: Overall, her neurocognitive profile was felt to be functionally consistent with a diagnosis of Major Vascular Neurocognitive Disorder. It was felt that NPH was unlikely, as well as Dementia with Lewy Bodies. Given the cerebrovascular changes and ischemia on neuroimaging, vascular disease seems a more probable etiology. Recommendation was consideration for nootropics to help with cognitive efficiency, as well as refraining from driving. Certain memory strategies were given.   PAST MEDICAL HISTORY: Past Medical History:  Diagnosis Date  . Anemia   . Cancer ([place])    skin cancer removed from top of head.per pt  . Carotid artery disease ([place])   . CHF (congestive heart failure), NYHA class II (HCC)    has sudden onset decom CHF? 2/2 to PNA  . Diverticulosis    chronic issues with consitpation and diarrhea  . Heart murmur   . Hypercholesterolemia   . Hypertension   . Hypertensive heart disease without CHF   . Hypothyroidism   . Irregular heart beat   . Lumbar disc disease   . Lumbar disc disease   . Mitral regurgitation   . Osteoarthritis    chronic-on multiple pain meds.  . Paroxysmal atrial fibrillation (HCC)    not on coumadin-Cards=[facility]-saw him ?1 yr ago  . Pneumonia    few times  . Urinary tract infection    hx of UTI     MEDICATIONS: Current Outpatient Prescriptions on File Prior to Visit  Medication Sig Dispense Refill  . acetaminophen (TYLENOL) 325 MG tablet Take 2 tablets (650 mg total) by mouth every 6 (six) hours as needed for mild pain (or Fever >/= 101).    Marland Kitchen atorvastatin (LIPITOR) 40 MG tablet Take 40 mg by mouth at bedtime.     . cholecalciferol (VITAMIN D) 1000 UNITS tablet Take 1,000 Units by mouth daily.    . feeding supplement, ENSURE ENLIVE, (ENSURE ENLIVE) LIQD Take 237 mLs by mouth 3 (three) times daily between meals.    . gabapentin (NEURONTIN) 100 MG capsule Take 4 capsules at night (Patient taking differently: Take 400 mg by mouth at bedtime. ) 120 capsule 6  . HYDROcodone-acetaminophen (NORCO/VICODIN) 5-325 MG tablet Take 1-2 tablets by mouth every 6 (six) hours as needed for moderate pain. 60 tablet 0  . iron polysaccharides (NIFEREX) 150 MG capsule Take 1 capsule (150 mg total) by mouth daily.    Marland Kitchen levothyroxine (SYNTHROID, LEVOTHROID) 75 MCG tablet Take 75 mcg by mouth daily before breakfast.     . methocarbamol (  ROBAXIN) 500 MG tablet Take 1 tablet (500 mg total) by mouth every 8 (eight) hours as needed for muscle spasms. 60 tablet 0  . metoprolol succinate (TOPROL-XL) 25 MG 24 hr tablet Take 25 mg by mouth daily.    . polyethylene glycol (MIRALAX / GLYCOLAX) packet Take 17 g by mouth daily.    Marland Kitchen senna-docusate (SENOKOT-S) 8.6-50 MG tablet Take 1 tablet by mouth at bedtime.    . TOVIAZ 8 MG TB24 tablet Take 8 mg by mouth at bedtime.   1  . traMADol (ULTRAM) 50 MG tablet Take 1-2 tablets (50-100 mg total) by mouth every 6 (six) hours as needed for moderate pain. 56 tablet 0  . warfarin (COUMADIN) 2.5 MG tablet Take 2.5 mg by mouth every evening.      No current facility-administered medications on file prior to visit.     ALLERGIES: Allergies  Allergen Reactions  . Celebrex [Celecoxib] Other (See Comments)    Reaction:  Anemia/leukopenia  . Diovan [Valsartan] Other (See  Comments)    Reaction:  Increases potassium levels/acute renal failure     FAMILY HISTORY: Family History  Problem Relation Age of Onset  . Cancer Mother   . Stroke Father   . Cancer Father   . Diabetes Sister   . Heart disease Sister        Heart Disease before age 64  . Heart disease Sister   . Cancer Brother     SOCIAL HISTORY: Social History   Social History  . Marital status: Widowed    Spouse name: N/A  . Number of children: 2  . Years of education: N/A   Occupational History  . Nurse, learning disability [employer]   Social History Main Topics  . Smoking status: Never Smoker  . Smokeless tobacco: Never Used  . Alcohol use No  . Drug use: No  . Sexual activity: Yes    Birth control/ protection: Post-menopausal   Other Topics Concern  . Not on file   Social History Narrative   Lives alone   Works full time   Still drives and very active usually    REVIEW OF SYSTEMS: Constitutional: No fevers, chills, or sweats, no generalized fatigue, change in appetite Eyes: No visual changes, double vision, eye pain Ear, nose and throat: No hearing loss, ear pain, nasal congestion, sore throat Cardiovascular: No chest pain, palpitations Respiratory:  No shortness of breath at rest or with exertion, wheezes GastrointestinaI: No nausea, vomiting, diarrhea, abdominal pain, fecal incontinence Genitourinary:  No dysuria, urinary retention or frequency Musculoskeletal:  + neck pain, back pain Integumentary: No rash, pruritus, skin lesions Neurological: as above Psychiatric: No depression, insomnia, anxiety Endocrine: No palpitations, fatigue, diaphoresis, mood swings, change in appetite, change in weight, increased thirst Hematologic/Lymphatic:  No anemia, purpura, petechiae. Allergic/Immunologic: no itchy/runny eyes, nasal congestion, recent allergic reactions, rashes  PHYSICAL EXAM: Vitals:   10/29/16 1144  BP: (!) 144/62  Pulse: 100   General: No acute  distress Head:  Normocephalic/atraumatic Neck: supple, no paraspinal tenderness, full range of motion Heart:  Regular rate and rhythm Lungs:  Clear to auscultation bilaterally Back: No paraspinal tenderness Skin/Extremities: No rash, no edema Neurological Exam: alert and oriented to person, place, and year. States it is Oct, Wed, Fall (it is Sept, Thurs, Summer). No aphasia or dysarthria. Fund of knowledge is appropriate.  Recent and remote memory are impaired. 1/3 delayed recall. Attention and concentration are normal.  Able to name objects and repeat  phrases. CDT 4/5 MMSE - Mini Mental State Exam 10/29/2016 12/06/2015 10/18/2014  Orientation to time 2 4 5   Orientation to Place 4 5 5   Registration 3 3 3   Attention/ Calculation 5 5 4   Recall 1 2 2   Language- name 2 objects 2 2 2   Language- repeat 1 1 1   Language- follow 3 step command 3 3 3   Language- read & follow direction 1 1 1   Write a sentence 1 1 1   Copy design 1 0 1  Total score 24 27 28     Cranial nerves: Pupils equal, round, reactive to light. Extraocular movements intact with no nystagmus. Visual fields full. Facial sensation intact. No facial asymmetry. Tongue, uvula, palate midline.  Motor: Bulk normal, +cogwheeling with distraction bilaterally, muscle strength 5/5 throughout with no pronator drift but limited due to bilateral shoulder pain. Sensation decreased to pin in a stocking distribution to ankles bilaterally, decreased vibration to knees bilaterally. No extinction to double simultaneous stimulation.  Deep tendon reflexes brisk +2 throughout, toes downgoing.  Finger to nose testing intact.  Gait slow small steps with walker, favors left knee (similar to prior). Negative Romberg test. No resting or postural tremor, mild endpoint tremor bilaterally  IMPRESSION: This is an 83 yo RH woman with a history of atrial fibrillation, mitral valve repair on chronic anticoagulation, hypertension, hyperlipidemia, peripheral vascular  disease, chronic back pain, who initially presented for memory loss, Neuropsychological evaluation indicated Major Vascular Neurocognitive Disorder (vascular dementia). She had stopped both Exelon and Memantine. Her daughter continues to report progressive decline in memory and difficulties with doing ADLs independently. 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 They are not interested in taking any medication from neurological standpoint at this time. Her MMSE today is 24/30 (27/30 in October 2017). We again had an extensive discussion regarding home and fall safety, continue 24/7 supervision, as well as potentially looking into higher level of care such as Memory Care in the future. Her daughter would like her to stay home as long as possible. We again discussed the importance of control of vascular risk factors, as much physical exercise as she can, and brain stimulation exercises for brain health. She will follow-up in 6 months and knows to call our office for any changes.   Thank you for allowing me to participate in her care.  Please do not hesitate to call for any questions or concerns.  The duration of this appointment visit was 25 minutes of face-to-face time with the patient.  Greater than 50% of this time was spent in counseling, explanation of diagnosis, planning of further management, and coordination of care.   Ellouise Newer, M.D.   CC: Dr. Orland Mustard

## 2016-10-29 NOTE — Patient Instructions (Signed)
1. Continue to monitor home and fall safety 2. Continue control of blood pressure, cholesterol, physical exercise and brain stimulation exercises for brain health 3. Follow-up in 6 months, call for any changes

## 2016-11-03 ENCOUNTER — Encounter: Payer: Self-pay | Admitting: Neurology

## 2016-11-12 DIAGNOSIS — M1711 Unilateral primary osteoarthritis, right knee: Secondary | ICD-10-CM | POA: Diagnosis not present

## 2016-11-17 ENCOUNTER — Ambulatory Visit (INDEPENDENT_AMBULATORY_CARE_PROVIDER_SITE_OTHER): Payer: PPO | Admitting: Sports Medicine

## 2016-11-17 ENCOUNTER — Encounter: Payer: Self-pay | Admitting: Sports Medicine

## 2016-11-17 DIAGNOSIS — M79674 Pain in right toe(s): Secondary | ICD-10-CM

## 2016-11-17 DIAGNOSIS — B351 Tinea unguium: Secondary | ICD-10-CM | POA: Diagnosis not present

## 2016-11-17 DIAGNOSIS — M79675 Pain in left toe(s): Secondary | ICD-10-CM | POA: Diagnosis not present
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
## 2016-11-18 DIAGNOSIS — I739 Peripheral vascular disease, unspecified: Secondary | ICD-10-CM | POA: Diagnosis not present

## 2016-11-18 DIAGNOSIS — I480 Paroxysmal atrial fibrillation: Secondary | ICD-10-CM | POA: Diagnosis not present

## 2016-11-18 DIAGNOSIS — Z7901 Long term (current) use of anticoagulants: Secondary | ICD-10-CM | POA: Diagnosis not present

## 2016-11-18 DIAGNOSIS — I511 Rupture of chordae tendineae, not elsewhere classified: Secondary | ICD-10-CM | POA: Diagnosis not present

## 2016-11-18 DIAGNOSIS — E784 Other hyperlipidemia: Secondary | ICD-10-CM | POA: Diagnosis not present

## 2016-11-18 DIAGNOSIS — G44309 Post-traumatic headache, unspecified, not intractable: Secondary | ICD-10-CM | POA: Diagnosis not present

## 2016-11-18 DIAGNOSIS — E039 Hypothyroidism, unspecified: Secondary | ICD-10-CM | POA: Diagnosis not present

## 2016-11-18 DIAGNOSIS — Z954 Presence of other heart-valve replacement: Secondary | ICD-10-CM | POA: Diagnosis not present

## 2016-11-18 DIAGNOSIS — I119 Hypertensive heart disease without heart failure: Secondary | ICD-10-CM | POA: Diagnosis not present

## 2016-11-18 DIAGNOSIS — I6529 Occlusion and stenosis of unspecified carotid artery: Secondary | ICD-10-CM | POA: Diagnosis not present

## 2016-11-19 DIAGNOSIS — M1711 Unilateral primary osteoarthritis, right knee: Secondary | ICD-10-CM | POA: Diagnosis not present

## 2016-11-25 DIAGNOSIS — M1711 Unilateral primary osteoarthritis, right knee: Secondary | ICD-10-CM | POA: Diagnosis not present

## 2016-12-10 DIAGNOSIS — G44309 Post-traumatic headache, unspecified, not intractable: Secondary | ICD-10-CM | POA: Diagnosis not present

## 2016-12-10 DIAGNOSIS — I511 Rupture of chordae tendineae, not elsewhere classified: Secondary | ICD-10-CM | POA: Diagnosis not present

## 2016-12-10 DIAGNOSIS — I6529 Occlusion and stenosis of unspecified carotid artery: Secondary | ICD-10-CM | POA: Diagnosis not present

## 2016-12-10 DIAGNOSIS — I480 Paroxysmal atrial fibrillation: Secondary | ICD-10-CM | POA: Diagnosis not present

## 2016-12-10 DIAGNOSIS — Z954 Presence of other heart-valve replacement: Secondary | ICD-10-CM | POA: Diagnosis not present

## 2016-12-10 DIAGNOSIS — E039 Hypothyroidism, unspecified: Secondary | ICD-10-CM | POA: Diagnosis not present

## 2016-12-10 DIAGNOSIS — I739 Peripheral vascular disease, unspecified: Secondary | ICD-10-CM | POA: Diagnosis not present

## 2016-12-10 DIAGNOSIS — E7849 Other hyperlipidemia: Secondary | ICD-10-CM | POA: Diagnosis not present

## 2016-12-10 DIAGNOSIS — Z7901 Long term (current) use of anticoagulants: Secondary | ICD-10-CM | POA: Diagnosis not present

## 2016-12-10 DIAGNOSIS — I119 Hypertensive heart disease without heart failure: Secondary | ICD-10-CM | POA: Diagnosis not present

## 2016-12-14 DIAGNOSIS — Z961 Presence of intraocular lens: Secondary | ICD-10-CM | POA: Diagnosis not present

## 2016-12-22 ENCOUNTER — Ambulatory Visit (INDEPENDENT_AMBULATORY_CARE_PROVIDER_SITE_OTHER): Payer: PPO | Admitting: Sports Medicine

## 2016-12-22 ENCOUNTER — Encounter: Payer: Self-pay | Admitting: Sports Medicine

## 2016-12-22 DIAGNOSIS — M79674 Pain in right toe(s): Secondary | ICD-10-CM

## 2016-12-22 DIAGNOSIS — L600 Ingrowing nail: Secondary | ICD-10-CM

## 2016-12-22 DIAGNOSIS — Z7901 Long term (current) use of anticoagulants: Secondary | ICD-10-CM

## 2016-12-22 DIAGNOSIS — B351 Tinea unguium: Secondary | ICD-10-CM

## 2016-12-22 DIAGNOSIS — M79675 Pain in left toe(s): Secondary | ICD-10-CM | POA: Diagnosis not present
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
## 2016-12-29 ENCOUNTER — Ambulatory Visit: Payer: PPO | Admitting: Sports Medicine

## 2016-12-29 DIAGNOSIS — M47816 Spondylosis without myelopathy or radiculopathy, lumbar region: Secondary | ICD-10-CM | POA: Diagnosis not present

## 2017-01-05 ENCOUNTER — Other Ambulatory Visit: Payer: Self-pay | Admitting: Neurology

## 2017-01-05 DIAGNOSIS — G609 Hereditary and idiopathic neuropathy, unspecified: Secondary | ICD-10-CM

## 2017-01-13 DIAGNOSIS — I739 Peripheral vascular disease, unspecified: Secondary | ICD-10-CM | POA: Diagnosis not present

## 2017-01-13 DIAGNOSIS — I6529 Occlusion and stenosis of unspecified carotid artery: Secondary | ICD-10-CM | POA: Diagnosis not present

## 2017-01-13 DIAGNOSIS — G44309 Post-traumatic headache, unspecified, not intractable: Secondary | ICD-10-CM | POA: Diagnosis not present

## 2017-01-13 DIAGNOSIS — E039 Hypothyroidism, unspecified: Secondary | ICD-10-CM | POA: Diagnosis not present

## 2017-01-13 DIAGNOSIS — Z954 Presence of other heart-valve replacement: Secondary | ICD-10-CM | POA: Diagnosis not present

## 2017-01-13 DIAGNOSIS — I511 Rupture of chordae tendineae, not elsewhere classified: Secondary | ICD-10-CM | POA: Diagnosis not present

## 2017-01-13 DIAGNOSIS — Z7901 Long term (current) use of anticoagulants: Secondary | ICD-10-CM | POA: Diagnosis not present

## 2017-01-13 DIAGNOSIS — I480 Paroxysmal atrial fibrillation: Secondary | ICD-10-CM | POA: Diagnosis not present

## 2017-01-13 DIAGNOSIS — I119 Hypertensive heart disease without heart failure: Secondary | ICD-10-CM | POA: Diagnosis not present

## 2017-01-27 DIAGNOSIS — N390 Urinary tract infection, site not specified: Secondary | ICD-10-CM | POA: Diagnosis not present

## 2017-02-02 ENCOUNTER — Telehealth: Payer: Self-pay | Admitting: *Deleted

## 2017-02-02 ENCOUNTER — Ambulatory Visit: Payer: PPO | Admitting: Sports Medicine

## 2017-02-02 ENCOUNTER — Encounter: Payer: Self-pay | Admitting: Sports Medicine

## 2017-02-02 DIAGNOSIS — L600 Ingrowing nail: Secondary | ICD-10-CM

## 2017-02-02 DIAGNOSIS — B351 Tinea unguium: Secondary | ICD-10-CM | POA: Diagnosis not present

## 2017-02-02 DIAGNOSIS — M79675 Pain in left toe(s): Secondary | ICD-10-CM

## 2017-02-02 DIAGNOSIS — M79674 Pain in right toe(s): Secondary | ICD-10-CM
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
## 2017-02-02 NOTE — Telephone Encounter (Signed)
Pt's dtr Kizzie Bane, states pt is unable to stand, and may have difficulty making today's 3:45pm appt, asked is there anyone that comes to pts' home to trim nails or would we trim pt's nails in the wheelchair. I told [name], our doctors do not go to homes, and at this time there is no one we know that will, we do trim pt's nails from wheelchairs if needed and I would make a note on the schedule for pt to remain in the wheelchair for debridement.

## 2017-02-05 DIAGNOSIS — N3020 Other chronic cystitis without hematuria: Secondary | ICD-10-CM | POA: Diagnosis not present

## 2017-02-10 DIAGNOSIS — Z7901 Long term (current) use of anticoagulants: Secondary | ICD-10-CM | POA: Diagnosis not present

## 2017-02-22 DIAGNOSIS — K5900 Constipation, unspecified: Secondary | ICD-10-CM | POA: Diagnosis not present

## 2017-02-24 DIAGNOSIS — Z954 Presence of other heart-valve replacement: Secondary | ICD-10-CM | POA: Diagnosis not present

## 2017-02-24 DIAGNOSIS — Z7901 Long term (current) use of anticoagulants: Secondary | ICD-10-CM | POA: Diagnosis not present

## 2017-02-26 ENCOUNTER — Telehealth: Payer: Self-pay

## 2017-03-03 ENCOUNTER — Ambulatory Visit: Payer: PPO | Admitting: Family

## 2017-03-03 ENCOUNTER — Encounter (HOSPITAL_COMMUNITY): Payer: PPO

## 2017-03-05 ENCOUNTER — Encounter (HOSPITAL_COMMUNITY): Payer: PPO

## 2017-03-05 ENCOUNTER — Ambulatory Visit: Payer: Self-pay | Admitting: Family

## 2017-03-24 DIAGNOSIS — M179 Osteoarthritis of knee, unspecified: Secondary | ICD-10-CM | POA: Insufficient documentation

## 2017-03-24 DIAGNOSIS — Z7901 Long term (current) use of anticoagulants: Secondary | ICD-10-CM | POA: Diagnosis not present

## 2017-03-24 DIAGNOSIS — M25511 Pain in right shoulder: Secondary | ICD-10-CM | POA: Insufficient documentation

## 2017-03-24 DIAGNOSIS — M1710 Unilateral primary osteoarthritis, unspecified knee: Secondary | ICD-10-CM | POA: Insufficient documentation

## 2017-03-24 DIAGNOSIS — M1711 Unilateral primary osteoarthritis, right knee: Secondary | ICD-10-CM | POA: Diagnosis not present

## 2017-04-05 DIAGNOSIS — E039 Hypothyroidism, unspecified: Secondary | ICD-10-CM | POA: Diagnosis not present

## 2017-04-05 DIAGNOSIS — I4891 Unspecified atrial fibrillation: Secondary | ICD-10-CM | POA: Diagnosis not present

## 2017-04-05 DIAGNOSIS — K219 Gastro-esophageal reflux disease without esophagitis: Secondary | ICD-10-CM | POA: Diagnosis not present

## 2017-04-05 DIAGNOSIS — E559 Vitamin D deficiency, unspecified: Secondary | ICD-10-CM | POA: Diagnosis not present

## 2017-04-05 DIAGNOSIS — R05 Cough: Secondary | ICD-10-CM | POA: Diagnosis not present

## 2017-04-05 DIAGNOSIS — E785 Hyperlipidemia, unspecified: Secondary | ICD-10-CM | POA: Diagnosis not present

## 2017-04-05 DIAGNOSIS — M130 Polyarthritis, unspecified: Secondary | ICD-10-CM | POA: Diagnosis not present

## 2017-04-05 DIAGNOSIS — I10 Essential (primary) hypertension: Secondary | ICD-10-CM | POA: Diagnosis not present

## 2017-04-05 DIAGNOSIS — Z111 Encounter for screening for respiratory tuberculosis: Secondary | ICD-10-CM | POA: Diagnosis not present

## 2017-04-06 ENCOUNTER — Ambulatory Visit: Payer: PPO | Admitting: Sports Medicine

## 2017-04-06 ENCOUNTER — Encounter: Payer: Self-pay | Admitting: Sports Medicine

## 2017-04-06 DIAGNOSIS — N390 Urinary tract infection, site not specified: Secondary | ICD-10-CM | POA: Diagnosis not present

## 2017-04-06 DIAGNOSIS — M79674 Pain in right toe(s): Secondary | ICD-10-CM

## 2017-04-06 DIAGNOSIS — Z7901 Long term (current) use of anticoagulants: Secondary | ICD-10-CM | POA: Diagnosis not present

## 2017-04-06 DIAGNOSIS — B351 Tinea unguium: Secondary | ICD-10-CM | POA: Diagnosis not present

## 2017-04-06 DIAGNOSIS — M79675 Pain in left toe(s): Secondary | ICD-10-CM

## 2017-04-06 DIAGNOSIS — L600 Ingrowing nail: Secondary | ICD-10-CM | POA: Diagnosis not present

## 2017-04-07 ENCOUNTER — Encounter: Payer: Self-pay | Admitting: Sports Medicine

## 2017-04-07 DIAGNOSIS — Z7901 Long term (current) use of anticoagulants: Secondary | ICD-10-CM | POA: Diagnosis not present
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
## 2017-04-14 DIAGNOSIS — Z7901 Long term (current) use of anticoagulants: Secondary | ICD-10-CM | POA: Diagnosis not present

## 2017-04-15 DIAGNOSIS — M533 Sacrococcygeal disorders, not elsewhere classified: Secondary | ICD-10-CM | POA: Diagnosis not present

## 2017-04-15 DIAGNOSIS — M79602 Pain in left arm: Secondary | ICD-10-CM | POA: Insufficient documentation

## 2017-04-15 DIAGNOSIS — S6990XA Unspecified injury of unspecified wrist, hand and finger(s), initial encounter: Secondary | ICD-10-CM | POA: Diagnosis not present

## 2017-04-19 DIAGNOSIS — Z7901 Long term (current) use of anticoagulants: Secondary | ICD-10-CM | POA: Diagnosis not present

## 2017-04-23 ENCOUNTER — Telehealth: Payer: Self-pay | Admitting: Neurology

## 2017-04-23 NOTE — Telephone Encounter (Signed)
Pt's daughter called and would like a call back today to discuss some things before pt's appointment next week

## 2017-04-26 DIAGNOSIS — Z7901 Long term (current) use of anticoagulants: Secondary | ICD-10-CM | POA: Diagnosis not present

## 2017-04-28 ENCOUNTER — Ambulatory Visit: Payer: PPO | Admitting: Neurology

## 2017-04-28 ENCOUNTER — Encounter: Payer: Self-pay | Admitting: Neurology

## 2017-04-28 VITALS — BP 110/70 | HR 90 | Ht <= 58 in | Wt 121.0 lb

## 2017-04-28 DIAGNOSIS — F0391 Unspecified dementia with behavioral disturbance: Secondary | ICD-10-CM

## 2017-04-28 DIAGNOSIS — F03B18 Unspecified dementia, moderate, with other behavioral disturbance: Secondary | ICD-10-CM | POA: Insufficient documentation

## 2017-04-28 MED ORDER — BUPROPION HCL 75 MG PO TABS: ORAL_TABLET | ORAL | 11 refills | Status: AC

## 2017-04-28 NOTE — Patient Instructions (Addendum)
1. Start Wellbutrin 75mg  at bedtime 2. Agree with higher level of care 3. Follow-up in 6 months, call for any changes  FALL PRECAUTIONS: Be cautious when walking. Scan the area for obstacles that may increase the risk of trips and falls. When getting up in the mornings, sit up at the edge of the bed for a few minutes before getting out of bed. Consider elevating the bed at the head end to avoid drop of blood pressure when getting up. Walk always in a well-lit room (use night lights in the walls). Avoid area rugs or power cords from appliances in the middle of the walkways. Use a walker or a cane if necessary and consider physical therapy for balance exercise. Get your eyesight checked regularly.  FINANCIAL OVERSIGHT: Supervision, especially oversight when making financial decisions or transactions is also recommended.  HOME SAFETY: Consider the safety of the kitchen when operating appliances like stoves, microwave oven, and blender. Consider having supervision and share cooking responsibilities until no longer able to participate in those. Accidents with firearms and other hazards in the house should be identified and addressed as well.  DRIVING: Regarding driving, in patients with progressive memory problems, driving will be impaired. We advise to have someone else do the driving if trouble finding directions or if minor accidents are reported. Independent driving assessment is available to determine safety of driving.  ABILITY TO BE LEFT ALONE: If patient is unable to contact 911 operator, consider using LifeLine, or when the need is there, arrange for someone to stay with patients. Smoking is a fire hazard, consider supervision or cessation. Risk of wandering should be assessed by caregiver and if detected at any point, supervision and safe proof recommendations should be instituted.  MEDICATION SUPERVISION: Inability to self-administer medication needs to be constantly addressed. Implement a  mechanism to ensure safe administration of the medications.  RECOMMENDATIONS FOR ALL PATIENTS WITH MEMORY PROBLEMS: 1. Continue to exercise (Recommend 30 minutes of walking everyday, or 3 hours every week) 2. Increase social interactions - continue going to [place] and enjoy social gatherings with friends and family 3. Eat healthy, avoid fried foods and eat more fruits and vegetables 4. Maintain adequate blood pressure, blood sugar, and blood cholesterol level. Reducing the risk of stroke and cardiovascular disease also helps promoting better memory. 5. Avoid stressful situations. Live a simple life and avoid aggravations. Organize your time and prepare for the next day in anticipation. 6. Sleep well, avoid any interruptions of sleep and avoid any distractions in the bedroom that may interfere with adequate sleep quality 7. Avoid sugar, avoid sweets as there is a strong link between excessive sugar intake, diabetes, and cognitive impairment We discussed the Mediterranean diet, which has been shown to help patients reduce the risk of progressive memory disorders and reduces cardiovascular risk. This includes eating fish, eat fruits and green leafy vegetables, nuts like almonds and hazelnuts, walnuts, and also use olive oil. Avoid fast foods and fried foods as much as possible. Avoid sweets and sugar as sugar use has been linked to worsening of memory function.  There is always a concern of gradual progression of memory problems. If this is the case, then we may need to adjust level of care according to patient needs. Support, both to the patient and caregiver, should then be put into place.

## 2017-04-28 NOTE — Progress Notes (Signed)
NEUROLOGY FOLLOW UP OFFICE NOTE  Desiree [name] [id number]  DOB: [birth year]  HISTORY OF PRESENT ILLNESS: I had the pleasure of seeing Desiree [name] in follow-up in the neurology clinic on 04/28/2017. She was last seen 6 months ago for vascular dementia. She is again accompanied by her daughter who helps supplement the history today. Her daughter wrote a letter for the visit detailing her concerns. She has deteriorated significantly, physically and cognitively, since her last visit. On most days, she can barely stand on her own with assistance. Rarely, she can shuffle for a few feet. Surprisingly, every once in a while, she walks without assistance. She needs assistance with every aspect of daily activity of living, including personal hygiene. She can eat if her food is chopped. It requires 2 people to give her a shower. It is no longer possible for her daughter and son-in-law with an additional caregiver to provide care from home. She is scheduled to move to [place] on 05/06/17, typically would have been placed in [facility] but the nurse who assessed her thought it better to give her a chance in assisted living then reassess in 2 weeks. For several months, she does not realize she is in her house. She does not recognize her son-in-law who stays with her every night. She asks to buy cards for deceased relatives. She was very upset about moving to [place], and has needed repeat conversations. Her daughter feels she is a bit more depressed than normal and is asking about a medication to temporarily help with her emotional angst. There has been some family issues as well with another daughter in [place] who does not understand the need for advanced care, causing a rift in the family.  She had side effects on Exelon and Memantine, as well as Lexapro started for mood in the past. She has had headaches recently. She denies any dizziness. She understandably became tearful and emotional in the office when we  talked about moving to [place].   HPI 10/17/14: This is a 84 yo RH woman with a history of atrial fibrillation, mitral valve repair on chronic anticoagulation with Coumadin, hypertension, hyperlipidemia, right carotid stenosis, chronic back pain, who presented for worsening memory and confusion. The patient feels that she has great memory. She does have problems remembering names or would forget what she went to do, but denies getting lost driving, no missed bill payments or missed medications. She lives by herself. Her daughter wrote a 2-page letter detailing her concerns which I will summarize as follows. She states her mother is not aware or is unwilling to admit she has issues with processing information, simple instructions, short term memory, or compromised judgement. She only agreed to come to today's visit after 2 recent events. The first occurred last 09/24/14 after she got cortisone injections to her knees. That evening, her daughter called her and noted she was very disoriented and confused. She thought it was a different day and was adamant about it. She was fully dressed and had woken up in her bed, which is unusual for her to take naps in her bed. She did not understand why she had done this. She called her 45 minutes later and seemed better. The next incident occurred on 10/06/14 around 45 minutes after they had left her house, she called and was very upset, confused, reporting bleeding on her face. They came back and found one of her eyeglass lenses on the floor about 5 feet away from her recliner. The  frames were bent out of shape. She did not know what had happened. She was brought to the ER where head CT was done which I personally reviewed, showing generalized diffuse atrophy with ventricular dilatation and moderate chronic microvascular disease confluent around the lateral ventricles, stable from 2015. No acute changes seen. She was discharged home back to mental baseline but reporting  headaches. Prior to these events, family has noticed difficulty doing tasks that are familiar to her. She does have trouble with new technology, which is not unusual with regards to the iPhone, but her daughter is concerned that she cannot set the timer on her stove despite repeated lessons and detailed illustration. She constantly loses her keys and pocketbook. She repeats the same stories. A few months ago, the patient was very concerned that her bottle of hydrocodone had disappeared. They found the pills in her nightstand but could not find the bottle. She has demonstrated poor judgement in many situations and is very moody and depressed but would not admit to it. She is teary, in constant pain. She is fiercely independent and resilient. She does not sleep well. She denies any alcohol intake or significant head injuries. Her younger sister had memory issues. Her brother had seizures. She had a normal birth and early development, no history of CNS infections, febrile seizures, or neurosurgical procedures.   Diagnostic Data: Neuropsychological evaluation at [facility]: Overall, her neurocognitive profile was felt to be functionally consistent with a diagnosis of Major Vascular Neurocognitive Disorder. It was felt that NPH was unlikely, as well as Dementia with Lewy Bodies. Given the cerebrovascular changes and ischemia on neuroimaging, vascular disease seems a more probable etiology. Recommendation was consideration for nootropics to help with cognitive efficiency, as well as refraining from driving. Certain memory strategies were given.   PAST MEDICAL HISTORY: Past Medical History:  Diagnosis Date  . Anemia   . Cancer ([facility])    skin cancer removed from top of head.per pt  . Carotid artery disease ([facility])   . CHF (congestive heart failure), NYHA class II ([facility])    has sudden onset decom CHF? 2/2 to PNA  . Diverticulosis    chronic issues with consitpation and diarrhea  . Heart murmur   . Hip fracture  ([facility])    right  . Hypercholesterolemia   . Hypertension   . Hypertensive heart disease without CHF   . Hypothyroidism   . Irregular heart beat   . Lumbar disc disease   . Lumbar disc disease   . Mitral regurgitation   . Osteoarthritis    chronic-on multiple pain meds.  . Paroxysmal atrial fibrillation ([facility])    not on coumadin-Cards=[facility]-saw him ?1 yr ago  . Pneumonia    few times  . Urinary tract infection    hx of UTI    MEDICATIONS: Current Outpatient Medications on File Prior to Visit  Medication Sig Dispense Refill  . acetaminophen (TYLENOL) 325 MG tablet Take 2 tablets (650 mg total) by mouth every 6 (six) hours as needed for mild pain (or Fever >/= 101).    Marland Kitchen atorvastatin (LIPITOR) 40 MG tablet Take 40 mg by mouth at bedtime.     . cholecalciferol (VITAMIN D) 1000 UNITS tablet Take 1,000 Units by mouth daily.    . feeding supplement, ENSURE ENLIVE, (ENSURE ENLIVE) LIQD Take 237 mLs by mouth 3 (three) times daily between meals.    . furosemide (LASIX) 40 MG tablet Take 40 mg by mouth as needed.    . gabapentin (  NEURONTIN) 100 MG capsule take 4 capsules by mouth every NIGHT 120 capsule 6  . HYDROcodone-acetaminophen (NORCO/VICODIN) 5-325 MG tablet Take 1-2 tablets by mouth every 6 (six) hours as needed for moderate pain. 60 tablet 0  . iron polysaccharides (NIFEREX) 150 MG capsule Take 1 capsule (150 mg total) by mouth daily.    Marland Kitchen levothyroxine (SYNTHROID, LEVOTHROID) 75 MCG tablet Take 75 mcg by mouth daily before breakfast.     . methocarbamol (ROBAXIN) 500 MG tablet Take 1 tablet (500 mg total) by mouth every 8 (eight) hours as needed for muscle spasms. 60 tablet 0  . metoprolol succinate (TOPROL-XL) 25 MG 24 hr tablet Take 25 mg by mouth daily.    . polyethylene glycol (MIRALAX / GLYCOLAX) packet Take 17 g by mouth daily.    . ranitidine (ZANTAC) 150 MG tablet Take 150 mg by mouth daily.    Marland Kitchen senna-docusate (SENOKOT-S) 8.6-50 MG tablet Take 1 tablet by mouth at bedtime.     . TOVIAZ 8 MG TB24 tablet Take 8 mg by mouth at bedtime.   1  . traMADol (ULTRAM) 50 MG tablet Take 1-2 tablets (50-100 mg total) by mouth every 6 (six) hours as needed for moderate pain. 56 tablet 0  . warfarin (COUMADIN) 2.5 MG tablet Take 2.5 mg by mouth every evening.      No current facility-administered medications on file prior to visit.     ALLERGIES: Allergies  Allergen Reactions  . Celebrex [Celecoxib] Other (See Comments)    Reaction:  Anemia/leukopenia  . Diovan [Valsartan] Other (See Comments)    Reaction:  Increases potassium levels/acute renal failure     FAMILY HISTORY: Family History  Problem Relation Age of Onset  . Cancer Mother   . Stroke Father   . Cancer Father   . Diabetes Sister   . Heart disease Sister        Heart Disease before age 71  . Heart disease Sister   . Cancer Brother     SOCIAL HISTORY: Social History   Socioeconomic History  . Marital status: Widowed    Spouse name: Not on file  . Number of children: 2  . Years of education: Not on file  . Highest education level: Not on file  Social Needs  . Financial resource strain: Not on file  . Food insecurity - worry: Not on file  . Food insecurity - inability: Not on file  . Transportation needs - medical: Not on file  . Transportation needs - non-medical: Not on file  Occupational History  . Occupation: Optometrist: [employer]  Tobacco Use  . Smoking status: Never Smoker  . Smokeless tobacco: Never Used  Substance and Sexual Activity  . Alcohol use: No    Alcohol/week: 0.0 oz  . Drug use: No  . Sexual activity: Yes    Birth control/protection: Post-menopausal  Other Topics Concern  . Not on file  Social History Narrative   Lives alone   Works full time   Still drives and very active usually    REVIEW OF SYSTEMS: Constitutional: No fevers, chills, or sweats, no generalized fatigue, change in appetite Eyes: No visual changes, double  vision, eye pain Ear, nose and throat: No hearing loss, ear pain, nasal congestion, sore throat Cardiovascular: No chest pain, palpitations Respiratory:  No shortness of breath at rest or with exertion, wheezes GastrointestinaI: No nausea, vomiting, diarrhea, abdominal pain, fecal incontinence Genitourinary:  No dysuria,  urinary retention or frequency Musculoskeletal:  + neck pain, back pain Integumentary: No rash, pruritus, skin lesions Neurological: as above Psychiatric: No depression, insomnia, anxiety Endocrine: No palpitations, fatigue, diaphoresis, mood swings, change in appetite, change in weight, increased thirst Hematologic/Lymphatic:  No anemia, purpura, petechiae. Allergic/Immunologic: no itchy/runny eyes, nasal congestion, recent allergic reactions, rashes  PHYSICAL EXAM: Vitals:   04/28/17 1129  BP: 110/70  Pulse: 90   General: No acute distress Head:  Normocephalic/atraumatic Neck: supple, no paraspinal tenderness, full range of motion Heart:  Regular rate and rhythm Lungs:  Clear to auscultation bilaterally Back: No paraspinal tenderness Skin/Extremities: No rash, no edema Neurological Exam: alert and oriented to person, place, season, day of week. States it is November 2001. No aphasia or dysarthria. Fund of knowledge is appropriate.  Recent and remote memory are impaired. 2/3 delayed recall. Attention and concentration are normal.  Able to name objects and repeat phrases. CDT 4/5 MMSE - Mini Mental State Exam 04/28/2017 10/29/2016 12/06/2015  Orientation to time 2 2 4   Orientation to Place 4 4 5   Registration 3 3 3   Attention/ Calculation 3 5 5   Recall 2 1 2   Language- name 2 objects 2 2 2   Language- repeat 1 1 1   Language- follow 3 step command 1 3 3   Language- read & follow direction 1 1 1   Write a sentence 1 1 1   Copy design 0 1 0  Total score 20 24 27     Cranial nerves: Pupils equal, round, reactive to light. Extraocular movements intact with no nystagmus.  Visual fields full. Facial sensation intact. No facial asymmetry. Tongue, uvula, palate midline.  Motor: Bulk normal, +cogwheeling with distraction bilaterally, muscle strength 5/5 throughout with no pronator drift but limited due to bilateral shoulder pain (similar to prior). Sensation intact to light touch. Deep tendon reflexes brisk +2 throughout, toes downgoing.  Finger to nose testing intact.  Gait not tested, needs 2-person assist for transfers. Mild left thumb resting tremor, bilateral endpoint tremors  IMPRESSION: This is an 84 yo RH woman with a history of atrial fibrillation, mitral valve repair on chronic anticoagulation, hypertension, hyperlipidemia, peripheral vascular disease, chronic back pain, with vascular dementia. She continues to have progressive cognitive (and physical) decline, MMSE today 20/30 (24/30 in September 2018, 27/30 in October 2017). She is scheduled to move to [facility] initially for assisted living, and then potentially to [facility] after re-evaluation. She needs assistance with all ADLs, would probably need more supervision than provided at assisted living. She is understandably depressed about moving, we agreed to start a low dose of Wellbutrin 75mg  qhs to help with mood. Side effects were discussed. She will follow-up in 6 months and knows to call our office for any changes.   Thank you for allowing me to participate in her care.  Please do not hesitate to call for any questions or concerns.  The duration of this appointment visit was 25 minutes of face-to-face time with the patient.  Greater than 50% of this time was spent in counseling, explanation of diagnosis, planning of further management, and coordination of care.   Ellouise Newer, M.D.   CC: Dr. Orland Mustard

## 2017-05-03 DIAGNOSIS — S8012XA Contusion of left lower leg, initial encounter: Secondary | ICD-10-CM | POA: Diagnosis not present

## 2017-05-04 DIAGNOSIS — N3941 Urge incontinence: Secondary | ICD-10-CM | POA: Diagnosis not present

## 2017-05-10 DIAGNOSIS — Z7901 Long term (current) use of anticoagulants: Secondary | ICD-10-CM | POA: Diagnosis not present

## 2017-05-11 ENCOUNTER — Encounter (HOSPITAL_COMMUNITY): Payer: Self-pay

## 2017-05-11 ENCOUNTER — Emergency Department (HOSPITAL_COMMUNITY): Payer: PPO

## 2017-05-11 ENCOUNTER — Emergency Department (HOSPITAL_COMMUNITY)
Admission: EM | Admit: 2017-05-11 | Discharge: 2017-05-11 | Disposition: A | Discharge status: Home / Self Care | Payer: PPO | Attending: Emergency Medicine | Admitting: Emergency Medicine

## 2017-05-11 DIAGNOSIS — R079 Chest pain, unspecified: Secondary | ICD-10-CM | POA: Diagnosis not present

## 2017-05-11 DIAGNOSIS — S161XXA Strain of muscle, fascia and tendon at neck level, initial encounter: Secondary | ICD-10-CM | POA: Insufficient documentation

## 2017-05-11 DIAGNOSIS — E039 Hypothyroidism, unspecified: Secondary | ICD-10-CM | POA: Diagnosis not present

## 2017-05-11 DIAGNOSIS — W19XXXA Unspecified fall, initial encounter: Secondary | ICD-10-CM

## 2017-05-11 DIAGNOSIS — S29012A Strain of muscle and tendon of back wall of thorax, initial encounter: Secondary | ICD-10-CM | POA: Diagnosis not present

## 2017-05-11 DIAGNOSIS — S098XXA Other specified injuries of head, initial encounter: Secondary | ICD-10-CM | POA: Insufficient documentation

## 2017-05-11 DIAGNOSIS — F0390 Unspecified dementia without behavioral disturbance: Secondary | ICD-10-CM | POA: Insufficient documentation

## 2017-05-11 DIAGNOSIS — R9431 Abnormal electrocardiogram [ECG] [EKG]: Secondary | ICD-10-CM | POA: Diagnosis not present

## 2017-05-11 DIAGNOSIS — W010XXA Fall on same level from slipping, tripping and stumbling without subsequent striking against object, initial encounter: Secondary | ICD-10-CM | POA: Diagnosis not present

## 2017-05-11 DIAGNOSIS — Z79899 Other long term (current) drug therapy: Secondary | ICD-10-CM | POA: Diagnosis not present

## 2017-05-11 DIAGNOSIS — Y9389 Activity, other specified: Secondary | ICD-10-CM | POA: Insufficient documentation

## 2017-05-11 DIAGNOSIS — I509 Heart failure, unspecified: Secondary | ICD-10-CM | POA: Diagnosis not present

## 2017-05-11 DIAGNOSIS — I110 Hypertensive heart disease with heart failure: Secondary | ICD-10-CM | POA: Insufficient documentation

## 2017-05-11 DIAGNOSIS — Y999 Unspecified external cause status: Secondary | ICD-10-CM | POA: Diagnosis not present

## 2017-05-11 DIAGNOSIS — S39012A Strain of muscle, fascia and tendon of lower back, initial encounter: Secondary | ICD-10-CM

## 2017-05-11 DIAGNOSIS — D689 Coagulation defect, unspecified: Secondary | ICD-10-CM | POA: Insufficient documentation

## 2017-05-11 DIAGNOSIS — S0990XA Unspecified injury of head, initial encounter: Secondary | ICD-10-CM

## 2017-05-11 DIAGNOSIS — I2510 Atherosclerotic heart disease of native coronary artery without angina pectoris: Secondary | ICD-10-CM | POA: Diagnosis not present

## 2017-05-11 DIAGNOSIS — S1980XA Other specified injuries of unspecified part of neck, initial encounter: Secondary | ICD-10-CM | POA: Diagnosis present

## 2017-05-11 DIAGNOSIS — S199XXA Unspecified injury of neck, initial encounter: Secondary | ICD-10-CM | POA: Diagnosis not present

## 2017-05-11 DIAGNOSIS — M545 Low back pain: Secondary | ICD-10-CM | POA: Diagnosis not present

## 2017-05-11 DIAGNOSIS — M25561 Pain in right knee: Secondary | ICD-10-CM | POA: Diagnosis not present

## 2017-05-11 DIAGNOSIS — Y9210 Unspecified residential institution as the place of occurrence of the external cause: Secondary | ICD-10-CM | POA: Diagnosis not present

## 2017-05-11 LAB — BASIC METABOLIC PANEL
ANION GAP: 10 (ref 5–15)
BUN: 14 mg/dL (ref 6–20)
CO2: 24 mmol/L (ref 22–32)
CREATININE: 1.05 mg/dL — AB (ref 0.44–1.00)
Calcium: 9.4 mg/dL (ref 8.9–10.3)
Chloride: 104 mmol/L (ref 101–111)
GFR calc Af Amer: 53 mL/min — ABNORMAL LOW (ref >60)
GFR calc non Af Amer: 45 mL/min — ABNORMAL LOW (ref >60)
GLUCOSE: 106 mg/dL — AB (ref 65–99)
Potassium: 3.9 mmol/L (ref 3.5–5.1)
Sodium: 138 mmol/L (ref 135–145)

## 2017-05-11 LAB — PROTIME-INR
INR: 4.77 — AB
Prothrombin Time: 44.4 seconds — ABNORMAL HIGH (ref 11.4–15.2)

## 2017-05-11 LAB — CBC WITH DIFFERENTIAL/PLATELET
Basophils Absolute: 0 10*3/uL (ref 0.0–0.1)
Basophils Relative: 1 %
EOS PCT: 1 %
Eosinophils Absolute: 0.1 10*3/uL (ref 0.0–0.7)
HEMATOCRIT: 36.8 % (ref 36.0–46.0)
Hemoglobin: 11.8 g/dL — ABNORMAL LOW (ref 12.0–15.0)
Lymphocytes Relative: 22 %
Lymphs Abs: 1.3 10*3/uL (ref 0.7–4.0)
MCH: 30.3 pg (ref 26.0–34.0)
MCHC: 32.1 g/dL (ref 30.0–36.0)
MCV: 94.4 fL (ref 78.0–100.0)
MONO ABS: 0.5 10*3/uL (ref 0.1–1.0)
MONOS PCT: 9 %
NEUTROS ABS: 3.8 10*3/uL (ref 1.7–7.7)
Neutrophils Relative %: 67 %
PLATELETS: 173 10*3/uL (ref 150–400)
RBC: 3.9 MIL/uL (ref 3.87–5.11)
RDW: 15 % (ref 11.5–15.5)
WBC: 5.7 10*3/uL (ref 4.0–10.5)
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
## 2017-05-11 NOTE — ED Triage Notes (Signed)
Pt coming from [facility] by ems due to an unwitnessed fall. Pt was in bed at 11pm tonight and when checked on again pt was on the floor. pzt is on  Blood thinners

## 2017-05-12 ENCOUNTER — Encounter (HOSPITAL_COMMUNITY): Payer: Self-pay | Admitting: Emergency Medicine

## 2017-05-12 ENCOUNTER — Emergency Department (HOSPITAL_COMMUNITY): Payer: PPO

## 2017-05-12 ENCOUNTER — Emergency Department (HOSPITAL_COMMUNITY)
Admission: EM | Admit: 2017-05-12 | Discharge: 2017-05-13 | Disposition: A | Discharge status: Home / Self Care | Payer: PPO | Attending: Emergency Medicine | Admitting: Emergency Medicine

## 2017-05-12 DIAGNOSIS — Y92199 Unspecified place in other specified residential institution as the place of occurrence of the external cause: Secondary | ICD-10-CM | POA: Diagnosis not present

## 2017-05-12 DIAGNOSIS — I2510 Atherosclerotic heart disease of native coronary artery without angina pectoris: Secondary | ICD-10-CM | POA: Insufficient documentation

## 2017-05-12 DIAGNOSIS — I110 Hypertensive heart disease with heart failure: Secondary | ICD-10-CM | POA: Insufficient documentation

## 2017-05-12 DIAGNOSIS — S41119A Laceration without foreign body of unspecified upper arm, initial encounter: Secondary | ICD-10-CM

## 2017-05-12 DIAGNOSIS — W19XXXA Unspecified fall, initial encounter: Secondary | ICD-10-CM | POA: Insufficient documentation

## 2017-05-12 DIAGNOSIS — F0390 Unspecified dementia without behavioral disturbance: Secondary | ICD-10-CM | POA: Diagnosis not present

## 2017-05-12 DIAGNOSIS — Z79899 Other long term (current) drug therapy: Secondary | ICD-10-CM | POA: Diagnosis not present

## 2017-05-12 DIAGNOSIS — I509 Heart failure, unspecified: Secondary | ICD-10-CM | POA: Diagnosis not present

## 2017-05-12 DIAGNOSIS — M5489 Other dorsalgia: Secondary | ICD-10-CM | POA: Diagnosis not present

## 2017-05-12 DIAGNOSIS — Z85820 Personal history of malignant melanoma of skin: Secondary | ICD-10-CM | POA: Insufficient documentation

## 2017-05-12 DIAGNOSIS — S0990XA Unspecified injury of head, initial encounter: Secondary | ICD-10-CM | POA: Diagnosis not present

## 2017-05-12 DIAGNOSIS — Z7901 Long term (current) use of anticoagulants: Secondary | ICD-10-CM | POA: Insufficient documentation

## 2017-05-12 DIAGNOSIS — E039 Hypothyroidism, unspecified: Secondary | ICD-10-CM | POA: Diagnosis not present

## 2017-05-12 DIAGNOSIS — Y939 Activity, unspecified: Secondary | ICD-10-CM | POA: Insufficient documentation

## 2017-05-12 DIAGNOSIS — S199XXA Unspecified injury of neck, initial encounter: Secondary | ICD-10-CM | POA: Diagnosis not present

## 2017-05-12 DIAGNOSIS — S59912A Unspecified injury of left forearm, initial encounter: Secondary | ICD-10-CM | POA: Diagnosis present

## 2017-05-12 DIAGNOSIS — R030 Elevated blood-pressure reading, without diagnosis of hypertension: Secondary | ICD-10-CM | POA: Diagnosis not present

## 2017-05-12 DIAGNOSIS — Y999 Unspecified external cause status: Secondary | ICD-10-CM | POA: Insufficient documentation

## 2017-05-12 DIAGNOSIS — M545 Low back pain: Secondary | ICD-10-CM | POA: Diagnosis not present

## 2017-05-12 DIAGNOSIS — K5641 Fecal impaction: Secondary | ICD-10-CM | POA: Insufficient documentation

## 2017-05-12 DIAGNOSIS — S51812A Laceration without foreign body of left forearm, initial encounter: Secondary | ICD-10-CM | POA: Insufficient documentation

## 2017-05-12 DIAGNOSIS — S3992XA Unspecified injury of lower back, initial encounter: Secondary | ICD-10-CM | POA: Diagnosis not present

## 2017-05-12 DIAGNOSIS — Y929 Unspecified place or not applicable: Secondary | ICD-10-CM | POA: Diagnosis not present

## 2017-05-12 LAB — I-STAT CHEM 8, ED
BUN: 11 mg/dL (ref 6–20)
Calcium, Ion: 1.2 mmol/L (ref 1.15–1.40)
Chloride: 105 mmol/L (ref 101–111)
Creatinine, Ser: 0.9 mg/dL (ref 0.44–1.00)
Glucose, Bld: 99 mg/dL (ref 65–99)
HEMATOCRIT: 31 % — AB (ref 36.0–46.0)
Hemoglobin: 10.5 g/dL — ABNORMAL LOW (ref 12.0–15.0)
Potassium: 3.5 mmol/L (ref 3.5–5.1)
SODIUM: 141 mmol/L (ref 135–145)
TCO2: 25 mmol/L (ref 22–32)

## 2017-05-12 NOTE — ED Notes (Signed)
Patient transported to CT 

## 2017-05-12 NOTE — ED Triage Notes (Signed)
Per EMS, pt from [facility] on [address]. Pt fell today and yesterday. Denies LOC. Pt on Coumadin. Pt reports tenderness on palpation to hematoma on back of her head, unknown if injury was from today or yesterday's fall. Pt has a new skin tear down to left arm from watch. Pt very anxious, pt A&Ox4. Pt reports chronic back pain. Pt also reported rt knee pain with some swelling but does not seem acute. Pt has baseline of vascular dementia. Pt is currently on ax for current UTI. NSR with PVCs on monitor.  EMS VS CBG 135. BP 174/68. Staff reports pt takes metoprolol.

## 2017-05-12 NOTE — ED Notes (Signed)
ED Provider at bedside. 
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
## 2017-05-12 NOTE — ED Notes (Signed)
ED Provider and family at bedside.

## 2017-05-13 ENCOUNTER — Emergency Department (HOSPITAL_COMMUNITY): Payer: PPO

## 2017-05-13 DIAGNOSIS — M545 Low back pain: Secondary | ICD-10-CM | POA: Diagnosis not present

## 2017-05-13 DIAGNOSIS — S3992XA Unspecified injury of lower back, initial encounter: Secondary | ICD-10-CM | POA: Diagnosis not present

## 2017-05-13 DIAGNOSIS — R100 Acute abdomen: Secondary | ICD-10-CM | POA: Diagnosis not present

## 2017-05-13 DIAGNOSIS — K5900 Constipation, unspecified: Secondary | ICD-10-CM | POA: Diagnosis not present

## 2017-05-13 LAB — PROTIME-INR
INR: 2.84
Prothrombin Time: 29.6 seconds — ABNORMAL HIGH (ref 11.4–15.2)

## 2017-05-13 MED ORDER — LEVOTHYROXINE SODIUM 75 MCG PO TABS
75.0000 ug | ORAL_TABLET | Freq: Every day | ORAL | Status: DC
  Filled 2017-05-13: qty 1

## 2017-05-13 MED ORDER — SULFAMETHOXAZOLE-TRIMETHOPRIM 400-80 MG PO TABS
1.0000 | ORAL_TABLET | Freq: Two times a day (BID) | ORAL | Status: DC
  Administered 2017-05-13: 1 via ORAL
  Filled 2017-05-13 (×2): qty 1

## 2017-05-13 MED ORDER — ONDANSETRON 4 MG PO TBDP
4.0000 mg | ORAL_TABLET | Freq: Once | ORAL | Status: AC
  Administered 2017-05-13: 4 mg via ORAL
  Filled 2017-05-13: qty 1

## 2017-05-13 MED ORDER — HYDROCODONE-ACETAMINOPHEN 10-325 MG PO TABS
1.0000 | ORAL_TABLET | Freq: Three times a day (TID) | ORAL | Status: DC
Start: 2017-05-13 — End: 2017-05-14
  Administered 2017-05-13 (×2): 1 via ORAL
  Filled 2017-05-13 (×2): qty 1

## 2017-05-13 MED ORDER — HYDROCODONE-ACETAMINOPHEN 5-325 MG PO TABS
1.0000 | ORAL_TABLET | Freq: Once | ORAL | Status: AC
  Administered 2017-05-13: 1 via ORAL
  Filled 2017-05-13: qty 1

## 2017-05-13 MED ORDER — DOCUSATE SODIUM 100 MG PO CAPS
100.0000 mg | ORAL_CAPSULE | Freq: Once | ORAL | Status: DC
Start: 2017-05-13 — End: 2017-05-14

## 2017-05-13 MED ORDER — MAGNESIUM CITRATE PO SOLN
1.0000 | Freq: Once | ORAL | Status: AC
  Administered 2017-05-13: 1 via ORAL
  Filled 2017-05-13: qty 296

## 2017-05-13 MED ORDER — FLEET ENEMA 7-19 GM/118ML RE ENEM
1.0000 | ENEMA | Freq: Once | RECTAL | Status: AC
  Administered 2017-05-13: 1 via RECTAL
  Filled 2017-05-13: qty 1

## 2017-05-13 MED ORDER — POLYETHYLENE GLYCOL 3350 17 G PO PACK: 17.0000 g | PACK | Freq: Every day | ORAL | Status: DC

## 2017-05-13 MED ORDER — FAMOTIDINE 20 MG PO TABS
20.0000 mg | ORAL_TABLET | Freq: Every day | ORAL | Status: DC
  Administered 2017-05-13: 20 mg via ORAL
  Filled 2017-05-13: qty 1

## 2017-05-13 MED ORDER — METOPROLOL SUCCINATE ER 25 MG PO TB24
25.0000 mg | ORAL_TABLET | Freq: Every day | ORAL | Status: DC
  Filled 2017-05-13: qty 1

## 2017-05-13 MED ORDER — ONDANSETRON HCL 4 MG PO TABS: 4.0000 mg | ORAL_TABLET | Freq: Three times a day (TID) | ORAL | 0 refills | Status: AC | PRN

## 2017-05-13 NOTE — ED Notes (Signed)
Patient denies pain and is resting comfortably.  

## 2017-05-13 NOTE — ED Notes (Signed)
Called [facility] again to see where pt is on the list. The first call for transport was at 1615. Was told "she is on the list and that's all we can tell you". Pt and son made aware.

## 2017-05-13 NOTE — Progress Notes (Addendum)
3:02pm- CSW reached out to Sam with [facility] and was informed that they may or may not be able to take pt. CSW has sent over FL2 to Sam at this time. CSW has spoken with daughter and informed her that if pt is discharged tonight pt will have to go back to [place] and CSW has confirmed with Sam that they do go to other facilities an assess pt needs. CSW has updated pt's daughter of this at this time.   2:44pm- CSW received call from [place] at [facility] and was informed that they just filled the last bed at [facility]. Dorian Pod on the phone with daughter at this time.   2:38pm- CSW receive call form daughter asking about [facility]. CSW spoke with Dorian Pod from [facility] and she looking at pt for placement with this facility at this time. Dorian Pod to follow up with CSW about bed space.   2:16pm- CSW has reached back out to [facility] for pricing of SNF placement since facility doesn't take insurance. CSW left another VM asking that staff call CSW back so that CSW can update family of cost. At this tim Josem Kaufmann has been started with [facility] (not received yet), however daughter is aware hat bed choice is needed in order to complete auth for facility.   1:36pm- [facility] is unable to take pt at this time. Daughter aware. CSW has reached back out to [facility] for prices.Left VM. Daughter asked CSW about pt returning to [place]. CSW informed daughter that [facility] does not have ALF and if pt returns to this facility family would have to hire 24 hoiur care as [facility] does not provide this. CSW spoke with [place] from [facility] and was informed that pt can return but would need those 24 hour sitters to help with any care. CSW has updated family at this time.   1:28pm- CSW received text message from [place] with [facility] and was informed that she is reviewing pt at this time. CSW to get and update as decision has been made.   12:44pm- CSW spoke with [place] from [facility] as pt's daughter requested CSW  reach out to this facility. CSw was informed that this facility only takes private pay and no insurance. CSW spoke wth pt's daughter and informed her of this and was informed that they may be able to pay privately but would need the cost before saying yes. CSW is awaiting call back from [place] at this time.   CSW has been speaking with pt's daughter [name] about placement options. CSW aware that PT recommend SNF. CSW has been started [place] with [facility] at this time. CSW made pt's daughter aware that pt has two bed offers at this time and pt may have to choose one of these if no other options available. CSW has reached out to [place] at [facility] and left VM asking that he call back as pt's daughter MAY be intrested in this . CSW has was made aware by daughter that she wasnt pt placed within [facility] because she doesn't drive however daughter refuses to be at [facility], [facility], and [facility] at this time. Pt's daughter is very interested in ratings therefore these three options are out of the questions. CSW is waiting to hear back from [facility] at this time. CSW will notify daughter that if no other bed is available then pt will be offered the choices that are avalibale or have to go home if refusing placement options. CSW will continue to work  with family and ensure that needs ar emet.     Virgie Dad Ricke Kimoto, MSW, [facility] Clinical Social Worker [phone]

## 2017-05-13 NOTE — Discharge Instructions (Addendum)
Please follow closely with [facility].  Return for any new or worsening symptoms.  If you choose a different skilled nursing facility may work with the social worker have ordered and your primary care physician.  Return for any worsening symptoms of constipation, nausea, vomiting abdominal pain.

## 2017-05-13 NOTE — Progress Notes (Signed)
CSW called [facility] for update on transportation. [facility] stated she is first on the list, so should be picked up soon.   Desiree [name], [facility]  [phone]

## 2017-05-13 NOTE — ED Provider Notes (Signed)
6:16 AM BP (!) 146/57   Pulse 75   Temp 97.7 F (36.5 C) (Oral)   Resp 20   Ht 4\' 10"  (1.473 m)   Wt 54.4 kg (120 lb)   SpO2 96%   BMI 25.08 kg/m   Patient taken in sign out from PA HUMES.  Here for repeat falls (6 in 6 days). On Coumadin.  Family unable to provide the level of care necessary for patient. Currently in ALF at [facility]. Needs memory care unit. Awaiting  SW/CM consult.    Patient was accepted to multiple ill nursing facility however the patient's daughter continued to refuse placement at several citing that she either did not like the facility or that it was out of her driving radius.  I saw the patient and discussed that we are unable to admit her and that due to the fact that she had been accepted at multiple places she could either be placed to their or she can return to [facility] or the daughter's house with home health and physical therapy.  Daughter is extremely unhappy and does not like this answer however understands that we have come to the end of our ability to help place the patient.  The patient appears appropriate for discharge at this time.   Margarita Mail, PA-C 05/14/17 2104

## 2017-05-13 NOTE — ED Notes (Signed)
Patient has vomited twice. PO meds held for now. She is unable to tolerate PO for now.

## 2017-05-13 NOTE — ED Provider Notes (Signed)
Medical screening examination/treatment/procedure(s) were conducted as a shared visit with non-physician practitioner(s) and myself.  I personally evaluated the patient during the encounter.   EKG Interpretation None      Patient is a 84 year old female with history of anticoagulation secondary to clotting disorder who presents to the emergency department from her assisted living facility with unwitnessed fall.  Patient was just placed in this nursing facility 6 days ago by her family.  They state that when the patient was at home she required 24-hour care.  Patient has dementia and is unable to tell us why she fell.  Fall was unwitnessed.  CT of the head and cervical spine showed no acute abnormality.  No other sign of acute trauma on exam at this time.  Labs show no acute abnormality.  She is on Bactrim currently for urinary tract infection.  Her only new medication is Wellbutrin.  She was coagulopathic several days ago and has been holding her Coumadin.  INR has improved to 2.8.  Patient does not have any reason to be admitted to the hospital.  Family does not feel comfortable sending her back to [place].  Will have case management see patient in the morning.   Natajah Derderian, Delice Bison, DO 05/13/17 [phone]

## 2017-05-13 NOTE — ED Notes (Signed)
Pt tolerating oral fluids well. No vomiting noted

## 2017-05-13 NOTE — ED Notes (Signed)
Breakfast tray ordered 
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
## 2017-05-13 NOTE — ED Notes (Signed)
Social Work at bedside with patient and family.

## 2017-05-13 NOTE — ED Notes (Signed)
Pt.was wet change bed linen and gown ,pt had asmall bm.clean pt.clean and dry

## 2017-05-13 NOTE — Evaluation (Signed)
Physical Therapy Evaluation Patient Details Name: Desiree [name] MRN: [id number] DOB: 14-Sep-1924 Today's Date: 05/13/2017   History of Present Illness  Pt is a 84 y/o female presenting to ED from ALF after fall. IMaging of head, C spine, and lumbar spine negative for acute abnormality. PMH includes dementia, skin cancer, CHF, a fib, HTN, R hip fx s/p R hip pinning.   Clinical Impression  Pt presenting to ED with problem above with deficits below. Pt with increased abdominal pain, therefore mobility limited. Required max to total assist for basic bed mobility and to stand at EOB. Pt's daughter concerned about pt going back to ALF, as pt has had multiple falls. Recommending higher level of care at d/c to maximize safety with mobility. Will continue to follow acutely to maximize functional mobility independence and safety.     Follow Up Recommendations SNF;Supervision/Assistance - 24 hour    Equipment Recommendations  None recommended by PT    Recommendations for Other Services       Precautions / Restrictions Precautions Precautions: Fall Precaution Comments: Pt's daughter reports 3-4 falls in the past 6 days at ALF.  Restrictions Weight Bearing Restrictions: No      Mobility  Bed Mobility Overal bed mobility: Needs Assistance Bed Mobility: Supine to Sit;Sit to Supine;Rolling Rolling: Mod assist   Supine to sit: Max assist Sit to supine: Total assist   General bed mobility comments: Max A for LE assist and for trunk elevation. Upon return to supine, required total assist. Mod A to roll to place sheet underneath pt for repositioning.   Transfers Overall transfer level: Needs assistance Equipment used: 1 person hand held assist Transfers: Sit to/from Stand Sit to Stand: Total assist         General transfer comment: Had pt grab onto PT arms for UE support. Required manual blocking of BLE as pts feet slid anteriorly. Total asisst to stand at EOB. Unsafe to attempt further  mobility.   Ambulation/Gait                Stairs            Wheelchair Mobility    Modified Rankin (Stroke Patients Only)       Balance Overall balance assessment: Needs assistance Sitting-balance support: Bilateral upper extremity supported;Feet supported Sitting balance-Leahy Scale: Poor Sitting balance - Comments: Min guard to mod A for sitting balance.    Standing balance support: Bilateral upper extremity supported Standing balance-Leahy Scale: Zero                               Pertinent Vitals/Pain Pain Assessment: Faces Faces Pain Scale: Hurts whole lot Pain Location: stomach  Pain Descriptors / Indicators: Aching Pain Intervention(s): Limited activity within patient's tolerance;Repositioned;Monitored during session    Home Living Family/patient expects to be discharged to:: Skilled nursing facility                      Prior Function Level of Independence: Needs assistance   Gait / Transfers Assistance Needed: Reports she was at ALF, however, was able to ambulate on her "good days" per daughter  ADL's / Homemaking Assistance Needed: Assited with ADLs from staff.         Hand Dominance   Dominant Hand: Right    Extremity/Trunk Assessment   Upper Extremity Assessment Upper Extremity Assessment: Generalized weakness    Lower Extremity Assessment Lower Extremity Assessment: Generalized weakness  Cervical / Trunk Assessment Cervical / Trunk Assessment: Kyphotic  Communication   Communication: No difficulties  Cognition Arousal/Alertness: Awake/alert Behavior During Therapy: Flat affect Overall Cognitive Status: History of cognitive impairments - at baseline                                 General Comments: Dementia at baseline. Oriented to self      General Comments General comments (skin integrity, edema, etc.): Pt's daughter present in the room. Very concerned about not having enough assist  at ALF. Lengthy conversation about SNF recommendations.     Exercises     Assessment/Plan    PT Assessment Patient needs continued PT services  PT Problem List Decreased strength;Decreased activity tolerance;Decreased balance;Decreased mobility;Decreased coordination;Decreased cognition;Decreased knowledge of use of DME;Decreased safety awareness;Decreased knowledge of precautions;Pain       PT Treatment Interventions DME instruction;Gait training;Functional mobility training;Therapeutic activities;Therapeutic exercise;Balance training;Neuromuscular re-education;Cognitive remediation;Patient/family education    PT Goals (Current goals can be found in the Care Plan section)  Acute Rehab PT Goals Patient Stated Goal: For pt to have higher level of care per daughter  PT Goal Formulation: With family Time For Goal Achievement: 05/27/17 Potential to Achieve Goals: Fair    Frequency Min 2X/week   Barriers to discharge        Co-evaluation               AM-PAC PT "6 Clicks" Daily Activity  Outcome Measure Difficulty turning over in bed (including adjusting bedclothes, sheets and blankets)?: Unable Difficulty moving from lying on back to sitting on the side of the bed? : Unable Difficulty sitting down on and standing up from a chair with arms (e.g., wheelchair, bedside commode, etc,.)?: Unable Help needed moving to and from a bed to chair (including a wheelchair)?: Total Help needed walking in hospital room?: Total Help needed climbing 3-5 steps with a railing? : Total 6 Click Score: 6    End of Session Equipment Utilized During Treatment: Gait belt Activity Tolerance: Patient limited by pain Patient left: in bed;with call bell/phone within reach;with family/visitor present Nurse Communication: Mobility status(needed purwick inserted) PT Visit Diagnosis: Unsteadiness on feet (R26.81);Difficulty in walking, not elsewhere classified (R26.2);Muscle weakness (generalized)  (M62.81);Repeated falls (R29.6);History of falling (Z91.81)    Time: 5638-7564 PT Time Calculation (min) (ACUTE ONLY): 26 min   Charges:   PT Evaluation $PT Eval Moderate Complexity: 1 Mod PT Treatments $Therapeutic Activity: 8-22 mins   PT G Codes:        Leighton Ruff, PT, DPT  Acute Rehabilitation Services  Pager: [phone]   Rudean Hitt 05/13/2017, 11:59 AM

## 2017-05-13 NOTE — Care Management Note (Signed)
Case Management Note  Patient Details  Name: Desiree [name] MRN: [id number] Date of Birth: February 17, 1925  Subjective/Objective:                  84 year old female with history of anticoagulation secondary to clotting disorder who presents to the emergency department from her assisted living facility [facility]) with unwitnessed fall.     Action/Plan: Follow for disposition needs.   Expected Discharge Date:        05/13/2017          Expected Discharge Plan:  Skilled Nursing Facility  In-House Referral:  Clinical Social Work  Discharge planning Services  CM Consult  Post Acute Care Choice:    Choice offered to:     DME Arranged:    DME Agency:     HH Arranged:    [place] Agency:     Status of Service:  In process, will continue to follow  If discussed at Long Length of Stay Meetings, dates discussed:    Additional Comments:  Fuller Mandril, RN 05/13/2017, 8:32 AM

## 2017-05-13 NOTE — ED Notes (Signed)
Pt c/o bodyaches since falling last night. Administered daily pain medication for pain control. Will continue to monitor.

## 2017-05-13 NOTE — ED Notes (Signed)
Called for pt transport to [facility]
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
## 2017-05-13 NOTE — Clinical Social Work Note (Signed)
Clinical Social Work Assessment  Patient Details  Name: Desiree [name] MRN: [id number] Date of Birth: 1925-02-08  Date of referral:  05/13/17               Reason for consult:  Facility Placement                Permission sought to share information with:  Family Supports Permission granted to share information::  Yes, Verbal Permission Granted  Name::     Desiree [name]   Agency::  family  Relationship::  daughter   Contact Information:  Desiree [name]   Housing/Transportation Living arrangements for the past 2 months:  Independent Living Facility([facility] Source of Information:  Adult Children(Desiree [name]) Patient Interpreter Needed:  None Criminal Activity/Legal Involvement Pertinent to Current Situation/Hospitalization:  No - Comment as needed Significant Relationships:  Adult Children Lives with:  Self Do you feel safe going back to the place where you live?  No Need for family participation in patient care:  Yes (Comment)  Care giving concerns:  CSW spoke with pt and daughter at bedside. AT this time family appeared to be concerned with pt's menta state as well as pt is limited with mobility at this time. Per daughter she is thinking that pt needs either memory care of SNF and prefers SNF for pt's safety at this time.    Social Worker assessment / plan:  CSW spoke with pt and daughter at bedside. During this time CSW was informed that pt is from [facility] and has been there for 6 days. CSW was informed that family feels that pt is needing a higher level of care as pt continues to falls and facility staff is unable to tell family when and how. Pt has support form daughter and other family members at this time. During this assessment pt was lying in bed with hospital gown on listing to conversation and speaking very little.   Employment status:  Retired Forensic scientist:  Other (Comment Required)(HTA) PT Recommendations:  Not assessed at this time Information / Referral to  community resources:  Skilled Nursing Facility(spoke with family about SNF placement options at bedside. )  Patient/Family's Response to care:  Pt's family response to  care appeared ot be understanding and agreeable to plan at this time.   Patient/Family's Understanding of and Emotional Response to Diagnosis, Current Treatment, and Prognosis:  No further questions or concerns have been presented to CSW at this time.   Emotional Assessment Appearance:  Appears stated age Attitude/Demeanor/Rapport:    Affect (typically observed):  Appropriate, Pleasant Orientation:  (pt has dementia. ) Alcohol / Substance use:  Not Applicable Psych involvement (Current and /or in the community):  No (Comment)  Discharge Needs  Concerns to be addressed:  Care Coordination Readmission within the last 30 days:  No Current discharge risk:  Dependent with Mobility, Cognitively Impaired Barriers to Discharge:  No SNF bed, Insurance Authorization   Wetzel Bjornstad, [place] 05/13/2017, 9:53 AM

## 2017-05-13 NOTE — Progress Notes (Addendum)
Ed CSW and EDP met with pt's daughter in pt's room. Explained to pt's daughter that pt will need to go home with home health to [place]. Pt's daughter has declined the SNF placements that have accepted the pt. Pt's daughter became upset about needing the pt to return back to [facility].   CSW received phone call from [facility]. [facility] confirmed that they do not have SNF. CSW informed pt's daughter. CSW spoke with pt's daughter about home health and explained the process for Medicaid and long term care. CSW provided pt's daughter with list of SNF and nursing facilities within 25 miles of [place]. CM established pt with home health through [facility] and included a Education officer, museum to assist with placement to a SNF of pt's daughter's chosing.   Pt's daughter asked CSW to call [facility] [place] to see about their SNF. CSW called and left a voicemail for Candice at [phone]. CSW has not received a phone call back. CSW received phone call from [facility]. Pt's insurance authorization has not come through yet and is not likely to come through until tomorrow.   CSW received phone call back from [facility]. [facility] does not have any beds available for SNF for non residents.   [facility] called CSW and informed CSW that pt will not be a viable fit for them.  CSW will inform pt's daughter. CSW informed pt's daughter. Pt's daughter accepting of plan.   Plan: Pt to be discharged home to [place] with home health.   Wendelyn Breslow, [facility]  [phone]

## 2017-05-13 NOTE — ED Notes (Signed)
Patient transported to X-ray 

## 2017-05-13 NOTE — Discharge Planning (Signed)
Talaya Lamprecht J. Sharea Guinther, RN, BSN, NCM [phone] Spoke with pt at bedside regarding discharge planning for Home Health Services. Offered pt list of home health agencies to choose from.  Pt chose [facility] to render services. Drew of [facility] notified. Patient made aware that [facility] will be in contact in 24-48 hours.  No DME needs identified at this time.  

## 2017-05-13 NOTE — ED Notes (Signed)
PT at bedside.

## 2017-05-13 NOTE — ED Provider Notes (Signed)
Assumed care from Dr. Leonides Schanz.  Patient has been pending discharge for increasing level of care recommendations so her care up to this point is previously documented.  Asked by nursing to see the patient at the daughter's request secondary to constipation and rectal pain.  She been impacted before but she had self disimpacted.  Has not had a bowel movement since last Thursday after moving into assisted living. Decreased intake but also with previous coccyx pain.  Physical Exam  BP 113/88   Pulse (!) 44   Temp 97.7 F (36.5 C) (Oral)   Resp 16   Ht 4\' 10"  (1.473 m)   Wt 54.4 kg (120 lb)   SpO2 100%   BMI 25.08 kg/m   Physical Exam  Constitutional: She appears well-developed and well-nourished.  Cardiovascular: Normal rate.  Genitourinary:  Genitourinary Comments: Large amount of hard dried stool in rectal vault.  Nursing note and vitals reviewed.   ED Course/Procedures     Fecal disimpaction Date/Time: 05/13/2017 1:37 PM Performed by: Merrily Pew, MD Authorized by: Merrily Pew, MD  Consent: Verbal consent obtained. Risks and benefits: risks, benefits and alternatives were discussed Consent given by: guardian and patient Patient understanding: patient states understanding of the procedure being performed Patient consent: the patient's understanding of the procedure matches consent given Patient identity confirmed: verbally with patient Time out: Immediately prior to procedure a "time out" was called to verify the correct patient, procedure, equipment, support staff and site/side marked as required. Preparation: Patient was prepped and draped in the usual sterile fashion. Local anesthesia used: no  Anesthesia: Local anesthesia used: no  Sedation: Patient sedated: no  Patient tolerance: Patient tolerated the procedure well with no immediate complications     MDM   Disimpacted as per procedure note.  Will give magnesium citrate and an enema to see if we can induce  further bowel movement.         Merrily Pew, MD 05/13/17 [phone]

## 2017-05-14 DIAGNOSIS — I480 Paroxysmal atrial fibrillation: Secondary | ICD-10-CM | POA: Diagnosis not present

## 2017-05-14 DIAGNOSIS — M150 Primary generalized (osteo)arthritis: Secondary | ICD-10-CM | POA: Diagnosis not present

## 2017-05-14 DIAGNOSIS — K219 Gastro-esophageal reflux disease without esophagitis: Secondary | ICD-10-CM | POA: Diagnosis not present

## 2017-05-14 DIAGNOSIS — I2510 Atherosclerotic heart disease of native coronary artery without angina pectoris: Secondary | ICD-10-CM | POA: Diagnosis not present

## 2017-05-14 DIAGNOSIS — I110 Hypertensive heart disease with heart failure: Secondary | ICD-10-CM | POA: Diagnosis not present

## 2017-05-14 DIAGNOSIS — K5901 Slow transit constipation: Secondary | ICD-10-CM | POA: Diagnosis not present

## 2017-05-14 DIAGNOSIS — G9009 Other idiopathic peripheral autonomic neuropathy: Secondary | ICD-10-CM | POA: Diagnosis not present

## 2017-05-14 DIAGNOSIS — E039 Hypothyroidism, unspecified: Secondary | ICD-10-CM | POA: Diagnosis not present

## 2017-05-14 DIAGNOSIS — E559 Vitamin D deficiency, unspecified: Secondary | ICD-10-CM | POA: Diagnosis not present

## 2017-05-14 DIAGNOSIS — N3281 Overactive bladder: Secondary | ICD-10-CM | POA: Diagnosis not present

## 2017-05-14 DIAGNOSIS — E782 Mixed hyperlipidemia: Secondary | ICD-10-CM | POA: Diagnosis not present

## 2017-05-14 DIAGNOSIS — N390 Urinary tract infection, site not specified: Secondary | ICD-10-CM | POA: Diagnosis not present

## 2017-05-17 ENCOUNTER — Telehealth: Payer: Self-pay | Admitting: Neurology

## 2017-05-17 NOTE — Telephone Encounter (Signed)
Pt's daughter called and wants to have pt taken off the Wellbutrin and the oders for that needs to be faxed to [facility] and please let daughter know when this has been done

## 2017-05-17 NOTE — Telephone Encounter (Signed)
Ok to [employer] Wellbutrin, pls send order, thanks

## 2017-05-18 ENCOUNTER — Encounter: Payer: Self-pay | Admitting: Sports Medicine

## 2017-05-18 ENCOUNTER — Ambulatory Visit (INDEPENDENT_AMBULATORY_CARE_PROVIDER_SITE_OTHER): Payer: PPO | Admitting: Sports Medicine

## 2017-05-18 DIAGNOSIS — L600 Ingrowing nail: Secondary | ICD-10-CM | POA: Diagnosis not present

## 2017-05-18 DIAGNOSIS — M79674 Pain in right toe(s): Secondary | ICD-10-CM | POA: Diagnosis not present

## 2017-05-18 DIAGNOSIS — M79675 Pain in left toe(s): Secondary | ICD-10-CM

## 2017-05-18 DIAGNOSIS — B351 Tinea unguium: Secondary | ICD-10-CM | POA: Diagnosis not present

## 2017-05-18 DIAGNOSIS — Z7901 Long term (current) use of anticoagulants: Secondary | ICD-10-CM
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
## 2017-05-18 NOTE — Telephone Encounter (Signed)
Orders faxed to [facility] on [address].  Pt's daughter made aware.

## 2017-05-19 DIAGNOSIS — Z7901 Long term (current) use of anticoagulants: Secondary | ICD-10-CM | POA: Diagnosis not present

## 2017-05-24 DIAGNOSIS — M545 Low back pain: Secondary | ICD-10-CM | POA: Diagnosis not present

## 2017-05-24 DIAGNOSIS — Z7901 Long term (current) use of anticoagulants: Secondary | ICD-10-CM | POA: Diagnosis not present

## 2017-05-24 DIAGNOSIS — I69918 Other symptoms and signs involving cognitive functions following unspecified cerebrovascular disease: Secondary | ICD-10-CM | POA: Diagnosis not present

## 2017-05-24 DIAGNOSIS — I739 Peripheral vascular disease, unspecified: Secondary | ICD-10-CM | POA: Diagnosis not present

## 2017-05-24 DIAGNOSIS — H8110 Benign paroxysmal vertigo, unspecified ear: Secondary | ICD-10-CM | POA: Diagnosis not present

## 2017-05-24 DIAGNOSIS — F0150 Vascular dementia without behavioral disturbance: Secondary | ICD-10-CM | POA: Diagnosis not present

## 2017-05-24 DIAGNOSIS — Z9181 History of falling: Secondary | ICD-10-CM | POA: Diagnosis not present

## 2017-05-24 DIAGNOSIS — I10 Essential (primary) hypertension: Secondary | ICD-10-CM | POA: Diagnosis not present

## 2017-05-24 DIAGNOSIS — M6281 Muscle weakness (generalized): Secondary | ICD-10-CM | POA: Diagnosis not present

## 2017-05-28 DIAGNOSIS — N390 Urinary tract infection, site not specified: Secondary | ICD-10-CM | POA: Diagnosis not present

## 2017-06-02 DIAGNOSIS — Z7901 Long term (current) use of anticoagulants: Secondary | ICD-10-CM | POA: Diagnosis not present

## 2017-06-04 DIAGNOSIS — R51 Headache: Secondary | ICD-10-CM | POA: Diagnosis not present

## 2017-06-07 DIAGNOSIS — Z7901 Long term (current) use of anticoagulants: Secondary | ICD-10-CM | POA: Diagnosis not present

## 2017-06-12 ENCOUNTER — Emergency Department (HOSPITAL_COMMUNITY): Payer: PPO

## 2017-06-12 ENCOUNTER — Encounter (HOSPITAL_COMMUNITY): Payer: Self-pay

## 2017-06-12 ENCOUNTER — Other Ambulatory Visit: Payer: Self-pay

## 2017-06-12 ENCOUNTER — Observation Stay (HOSPITAL_COMMUNITY)
Admission: EM | Admit: 2017-06-12 | Discharge: 2017-06-13 | Disposition: A | Discharge status: Home / Self Care | Payer: PPO | Attending: Family Medicine | Admitting: Family Medicine

## 2017-06-12 DIAGNOSIS — S065XAA Traumatic subdural hemorrhage with loss of consciousness status unknown, initial encounter: Secondary | ICD-10-CM | POA: Diagnosis present

## 2017-06-12 DIAGNOSIS — I499 Cardiac arrhythmia, unspecified: Secondary | ICD-10-CM | POA: Diagnosis not present

## 2017-06-12 DIAGNOSIS — S0292XA Unspecified fracture of facial bones, initial encounter for closed fracture: Secondary | ICD-10-CM | POA: Diagnosis not present

## 2017-06-12 DIAGNOSIS — Y999 Unspecified external cause status: Secondary | ICD-10-CM | POA: Diagnosis not present

## 2017-06-12 DIAGNOSIS — S0091XA Abrasion of unspecified part of head, initial encounter: Secondary | ICD-10-CM | POA: Diagnosis not present

## 2017-06-12 DIAGNOSIS — F0391 Unspecified dementia with behavioral disturbance: Secondary | ICD-10-CM | POA: Insufficient documentation

## 2017-06-12 DIAGNOSIS — S0010XA Contusion of unspecified eyelid and periocular area, initial encounter: Secondary | ICD-10-CM | POA: Diagnosis not present

## 2017-06-12 DIAGNOSIS — S065X9A Traumatic subdural hemorrhage with loss of consciousness of unspecified duration, initial encounter: Secondary | ICD-10-CM | POA: Diagnosis not present

## 2017-06-12 DIAGNOSIS — Z7901 Long term (current) use of anticoagulants: Secondary | ICD-10-CM | POA: Diagnosis not present

## 2017-06-12 DIAGNOSIS — Y92129 Unspecified place in nursing home as the place of occurrence of the external cause: Secondary | ICD-10-CM | POA: Diagnosis not present

## 2017-06-12 DIAGNOSIS — W19XXXA Unspecified fall, initial encounter: Secondary | ICD-10-CM | POA: Diagnosis not present

## 2017-06-12 DIAGNOSIS — W1789XA Other fall from one level to another, initial encounter: Secondary | ICD-10-CM | POA: Diagnosis not present

## 2017-06-12 DIAGNOSIS — Z79899 Other long term (current) drug therapy: Secondary | ICD-10-CM | POA: Diagnosis not present

## 2017-06-12 DIAGNOSIS — I509 Heart failure, unspecified: Secondary | ICD-10-CM | POA: Diagnosis not present

## 2017-06-12 DIAGNOSIS — I779 Disorder of arteries and arterioles, unspecified: Secondary | ICD-10-CM | POA: Diagnosis present

## 2017-06-12 DIAGNOSIS — Y939 Activity, unspecified: Secondary | ICD-10-CM | POA: Insufficient documentation

## 2017-06-12 DIAGNOSIS — S0240CA Maxillary fracture, right side, initial encounter for closed fracture: Secondary | ICD-10-CM | POA: Diagnosis not present

## 2017-06-12 DIAGNOSIS — I739 Peripheral vascular disease, unspecified: Secondary | ICD-10-CM

## 2017-06-12 DIAGNOSIS — I110 Hypertensive heart disease with heart failure: Secondary | ICD-10-CM | POA: Diagnosis not present

## 2017-06-12 DIAGNOSIS — S065X0A Traumatic subdural hemorrhage without loss of consciousness, initial encounter: Secondary | ICD-10-CM | POA: Diagnosis not present

## 2017-06-12 DIAGNOSIS — F03B18 Unspecified dementia, moderate, with other behavioral disturbance: Secondary | ICD-10-CM | POA: Diagnosis present

## 2017-06-12 DIAGNOSIS — Z85828 Personal history of other malignant neoplasm of skin: Secondary | ICD-10-CM | POA: Insufficient documentation

## 2017-06-12 DIAGNOSIS — S098XXA Other specified injuries of head, initial encounter: Secondary | ICD-10-CM | POA: Diagnosis not present

## 2017-06-12 DIAGNOSIS — I6529 Occlusion and stenosis of unspecified carotid artery: Principal | ICD-10-CM | POA: Insufficient documentation

## 2017-06-12 DIAGNOSIS — Z9889 Other specified postprocedural states: Secondary | ICD-10-CM

## 2017-06-12 DIAGNOSIS — I480 Paroxysmal atrial fibrillation: Secondary | ICD-10-CM | POA: Diagnosis not present

## 2017-06-12 DIAGNOSIS — R008 Other abnormalities of heart beat: Secondary | ICD-10-CM | POA: Diagnosis not present

## 2017-06-12 DIAGNOSIS — R001 Bradycardia, unspecified: Secondary | ICD-10-CM | POA: Diagnosis not present

## 2017-06-12 DIAGNOSIS — S0990XA Unspecified injury of head, initial encounter: Secondary | ICD-10-CM | POA: Diagnosis not present

## 2017-06-12 DIAGNOSIS — S199XXA Unspecified injury of neck, initial encounter: Secondary | ICD-10-CM | POA: Diagnosis not present

## 2017-06-12 DIAGNOSIS — E039 Hypothyroidism, unspecified: Secondary | ICD-10-CM | POA: Insufficient documentation

## 2017-06-12 DIAGNOSIS — I498 Other specified cardiac arrhythmias: Secondary | ICD-10-CM

## 2017-06-12 LAB — BASIC METABOLIC PANEL
Anion gap: 10 (ref 5–15)
BUN: 14 mg/dL (ref 6–20)
CALCIUM: 9.1 mg/dL (ref 8.9–10.3)
CO2: 23 mmol/L (ref 22–32)
Chloride: 107 mmol/L (ref 101–111)
Creatinine, Ser: 0.85 mg/dL (ref 0.44–1.00)
GFR calc Af Amer: >60 mL/min (ref >60)
GFR, EST NON AFRICAN AMERICAN: 59 mL/min — AB (ref >60)
GLUCOSE: 96 mg/dL (ref 65–99)
Potassium: 3.9 mmol/L (ref 3.5–5.1)
SODIUM: 140 mmol/L (ref 135–145)

## 2017-06-12 LAB — CBC WITH DIFFERENTIAL/PLATELET
BASOS ABS: 0 10*3/uL (ref 0.0–0.1)
BASOS PCT: 1 %
EOS PCT: 2 %
Eosinophils Absolute: 0.1 10*3/uL (ref 0.0–0.7)
HCT: 36.4 % (ref 36.0–46.0)
Hemoglobin: 11.4 g/dL — ABNORMAL LOW (ref 12.0–15.0)
Lymphocytes Relative: 29 %
Lymphs Abs: 1.6 10*3/uL (ref 0.7–4.0)
MCH: 30.2 pg (ref 26.0–34.0)
MCHC: 31.3 g/dL (ref 30.0–36.0)
MCV: 96.3 fL (ref 78.0–100.0)
MONO ABS: 0.4 10*3/uL (ref 0.1–1.0)
Monocytes Relative: 7 %
Neutro Abs: 3.4 10*3/uL (ref 1.7–7.7)
Neutrophils Relative %: 61 %
PLATELETS: 159 10*3/uL (ref 150–400)
RBC: 3.78 MIL/uL — AB (ref 3.87–5.11)
RDW: 14.5 % (ref 11.5–15.5)
WBC: 5.5 10*3/uL (ref 4.0–10.5)

## 2017-06-12 LAB — URINALYSIS, ROUTINE W REFLEX MICROSCOPIC
Bilirubin Urine: NEGATIVE
GLUCOSE, UA: NEGATIVE mg/dL
HGB URINE DIPSTICK: NEGATIVE
KETONES UR: NEGATIVE mg/dL
Leukocytes, UA: NEGATIVE
Nitrite: NEGATIVE
PROTEIN: NEGATIVE mg/dL
Specific Gravity, Urine: 1.011 (ref 1.005–1.030)
pH: 6 (ref 5.0–8.0)

## 2017-06-12 LAB — TSH: TSH: 1.534 u[IU]/mL (ref 0.350–4.500)

## 2017-06-12 LAB — I-STAT TROPONIN, ED: Troponin i, poc: 0 ng/mL (ref 0.00–0.08)

## 2017-06-12 LAB — PROTIME-INR
INR: 2.11
Prothrombin Time: 23.5 seconds — ABNORMAL HIGH (ref 11.4–15.2)

## 2017-06-12 LAB — MAGNESIUM: MAGNESIUM: 1.9 mg/dL (ref 1.7–2.4)

## 2017-06-12 MED ORDER — LEVOTHYROXINE SODIUM 75 MCG PO TABS
75.0000 ug | ORAL_TABLET | Freq: Every day | ORAL | Status: DC
  Administered 2017-06-13: 75 ug via ORAL
  Filled 2017-06-12 (×2): qty 1

## 2017-06-12 MED ORDER — TRAMADOL HCL 50 MG PO TABS: 50.0000 mg | ORAL_TABLET | Freq: Four times a day (QID) | ORAL | Status: DC | PRN

## 2017-06-12 MED ORDER — MORPHINE SULFATE (PF) 4 MG/ML IV SOLN
1.0000 mg | INTRAVENOUS | Status: DC | PRN
Start: 2017-06-12 — End: 2017-06-13

## 2017-06-12 MED ORDER — HYDROCODONE-ACETAMINOPHEN 5-325 MG PO TABS: 1.0000 | ORAL_TABLET | Freq: Four times a day (QID) | ORAL | Status: DC | PRN

## 2017-06-12 MED ORDER — ATORVASTATIN CALCIUM 40 MG PO TABS
40.0000 mg | ORAL_TABLET | Freq: Every day | ORAL | Status: DC
  Administered 2017-06-12: 40 mg via ORAL
  Filled 2017-06-12 (×2): qty 1

## 2017-06-12 MED ORDER — POLYSACCHARIDE IRON COMPLEX 150 MG PO CAPS
150.0000 mg | ORAL_CAPSULE | Freq: Every day | ORAL | Status: DC
  Administered 2017-06-12 – 2017-06-13 (×2): 150 mg via ORAL
  Filled 2017-06-12 (×2): qty 1

## 2017-06-12 MED ORDER — ENSURE ENLIVE PO LIQD
237.0000 mL | Freq: Three times a day (TID) | ORAL | Status: DC
  Administered 2017-06-12 – 2017-06-13 (×3): 237 mL via ORAL
  Filled 2017-06-12: qty 237

## 2017-06-12 MED ORDER — ONDANSETRON HCL 4 MG PO TABS: 4.0000 mg | ORAL_TABLET | Freq: Four times a day (QID) | ORAL | Status: DC | PRN

## 2017-06-12 MED ORDER — SENNOSIDES-DOCUSATE SODIUM 8.6-50 MG PO TABS
1.0000 | ORAL_TABLET | Freq: Every evening | ORAL | Status: DC | PRN
Start: 2017-06-12 — End: 2017-06-13

## 2017-06-12 MED ORDER — ACETAMINOPHEN 325 MG PO TABS
650.0000 mg | ORAL_TABLET | Freq: Four times a day (QID) | ORAL | Status: DC | PRN
  Administered 2017-06-12: 650 mg via ORAL
  Filled 2017-06-12 (×2): qty 2

## 2017-06-12 MED ORDER — BUPROPION HCL 75 MG PO TABS
75.0000 mg | ORAL_TABLET | Freq: Two times a day (BID) | ORAL | Status: DC
  Administered 2017-06-12 – 2017-06-13 (×2): 75 mg via ORAL
  Filled 2017-06-12 (×3): qty 1

## 2017-06-12 MED ORDER — SODIUM CHLORIDE 0.9 % IV SOLN
INTRAVENOUS | Status: DC
  Administered 2017-06-12: 19:00:00 via INTRAVENOUS

## 2017-06-12 MED ORDER — ONDANSETRON HCL 4 MG/2ML IJ SOLN: 4.0000 mg | Freq: Four times a day (QID) | INTRAMUSCULAR | Status: DC | PRN

## 2017-06-12 MED ORDER — ACETAMINOPHEN 650 MG RE SUPP: 650.0000 mg | Freq: Four times a day (QID) | RECTAL | Status: DC | PRN

## 2017-06-12 MED ORDER — FESOTERODINE FUMARATE ER 8 MG PO TB24
8.0000 mg | ORAL_TABLET | Freq: Every day | ORAL | Status: DC
  Administered 2017-06-12: 8 mg via ORAL
  Filled 2017-06-12 (×2): qty 1

## 2017-06-12 MED ORDER — METHOCARBAMOL 500 MG PO TABS: 500.0000 mg | ORAL_TABLET | Freq: Three times a day (TID) | ORAL | Status: DC | PRN

## 2017-06-12 MED ORDER — DOCUSATE SODIUM 100 MG PO CAPS: 100.0000 mg | ORAL_CAPSULE | ORAL | Status: DC | PRN

## 2017-06-12 MED ORDER — GABAPENTIN 400 MG PO CAPS
400.0000 mg | ORAL_CAPSULE | Freq: Every day | ORAL | Status: DC
  Administered 2017-06-12: 400 mg via ORAL
  Filled 2017-06-12: qty 1

## 2017-06-12 NOTE — ED Notes (Signed)
Purewick placed on pt. 

## 2017-06-12 NOTE — H&P (Signed)
History and Physical    Desiree [name] QQV:[id number] DOB: 29-Jun-1924 DOA: 06/12/2017  PCP: London Pepper, MD Patient coming from: facility  Chief Complaint: fall/  HPI: Desiree [name] is a very pleasantly demented 84 y.o. female with medical history significant for atrial fibrillation on Coumadin, mitral valve repair, hypertension, hyperlipidemia, right carotid stenosis, chronic back pain, vascular dementia presents to emergency Department chief complaint of fall. Initial evaluation reveals small subdural hematoma. Neurosurgery consulted recommended medical admission for observation and repeat CT in the a.m. [facility] are asked to admit.  Information is obtained from the chart and the daughters at the bedside. She reports patient moved into facility 5 weeks ago she states she has fallen 3 times since then. She reports patient's mobility waxes and wanes. Some tissue so weak she can't stand up and bear weight in the next day she'll get up on her own when no one is looking and her gait is unsteady. Today EMS was called to the facility for a witnessed fall. Staff reports patient got up fell and hit her head on the bedside table. There is been no report of any recent illness no fever chills cough no nausea vomiting diarrhea. No complaints of dysuria hematuria frequency or urgency. At the time of admission patient has no recollection of the fall. Patient denies headache dizziness chest pain palpitations or shortness of breath. She does convey that she's hungry and would like some food.   ED Course: in the emergency department she's hemodynamically stable afebrile and not hypoxic.  Review of Systems: As per HPI otherwise all other systems reviewed and are negative.   Ambulatory Status: stamina and functional status variable. Usually uses a walker very unsteady gait. Has had several recent falls  Past Medical History:  Diagnosis Date  . Anemia   . Cancer ([place])    skin cancer removed from top of  head.per pt  . Carotid artery disease ([place])   . CHF (congestive heart failure), NYHA class II (HCC)    has sudden onset decom CHF? 2/2 to PNA  . Diverticulosis    chronic issues with consitpation and diarrhea  . Heart murmur   . Hip fracture ([place])    right  . Hypercholesterolemia   . Hypertension   . Hypertensive heart disease without CHF   . Hypothyroidism   . Irregular heart beat   . Lumbar disc disease   . Lumbar disc disease   . Mitral regurgitation   . Osteoarthritis    chronic-on multiple pain meds.  . Paroxysmal atrial fibrillation (HCC)    not on coumadin-Cards=[place]-saw him ?1 yr ago  . Pneumonia    few times  . Urinary tract infection    hx of UTI    Past Surgical History:  Procedure Laterality Date  . ABDOMINAL HYSTERECTOMY    . CARDIAC CATHETERIZATION  01/14/11  . CAROTID ENDARTERECTOMY Right 10-09-03   with resection of redundant CCA  . CARPAL TUNNEL RELEASE     rt  . CATARACT EXTRACTION     bil  . CHEST TUBE INSERTION  02/05/2011   Procedure: INSERTION PLEURAL DRAINAGE CATHETER;  Surgeon: Rexene Alberts, MD;  Location: [place];  Service: Thoracic;  Laterality: Right;  . EYE SURGERY    . FRACTURE SURGERY  Aug. 2013   Right wrist   . HIP PINNING,CANNULATED Right 06/26/2016   Procedure: CANNULATED HIP PINNING;  Surgeon: Gaynelle Arabian, MD;  Location: [facility];  Service: Orthopedics;  Laterality: Right;  . HIP  SURGERY  06/26/2016   right  . KNEE SURGERY     bilateral arthroscopy  . LEFT AND RIGHT HEART CATHETERIZATION WITH CORONARY ANGIOGRAM N/A 01/14/2011   Procedure: LEFT AND RIGHT HEART CATHETERIZATION WITH CORONARY ANGIOGRAM;  Surgeon: Jacolyn Reedy, MD;  Location: [facility] CATH [facility];  Service: Cardiovascular;  Laterality: N/A;  . MITRAL VALVE REPAIR  01/21/2011   Procedure: MITRAL VALVE REPAIR (MVR);  Surgeon: Rexene Alberts, MD;  Location: [facility];  Service: Open Heart Surgery;  Laterality: N/A;  . REMOVAL OF PLEURAL DRAINAGE CATHETER  04/09/2011   Procedure:  REMOVAL OF PLEURAL DRAINAGE CATHETER;  Surgeon: Rexene Alberts, MD;  Location: [facility];  Service: Thoracic;  Laterality: Right;  . ROTATOR CUFF REPAIR     2 on right, 1 on left  . TALC PLEURODESIS  04/09/2011   Procedure: Pietro Cassis;  Surgeon: Rexene Alberts, MD;  Location: [place];  Service: Thoracic;  Laterality: Right;  . TEE WITHOUT CARDIOVERSION  01/13/2011   Procedure: TRANSESOPHAGEAL ECHOCARDIOGRAM (TEE);  Surgeon: Josue Hector, MD;  Location[facility] ENDOS[facility];  Service: Cardiovascular;  Laterality: Left;    Social History   Socioeconomic History  . Marital status: Widowed    Spouse name: Not on file  . Number of children: 2  . Years of education: Not on file  . Highest education level: Not on file  Occupational History  . Occupation: Optometrist: [employer]  Social Needs  . Financial resource strain: Not on file  . Food insecurity:    Worry: Not on file    Inability: Not on file  . Transportation needs:    Medical: Not on file    Non-medical: Not on file  Tobacco Use  . Smoking status: Never Smoker  . Smokeless tobacco: Never Used  Substance and Sexual Activity  . Alcohol use: No    Alcohol/week: 0.0 oz  . Drug use: No  . Sexual activity: Yes    Birth control/protection: Post-menopausal  Lifestyle  . Physical activity:    Days per week: Not on file    Minutes per session: Not on file  . Stress: Not on file  Relationships  . Social connections:    Talks on phone: Not on file    Gets together: Not on file    Attends religious service: Not on file    Active member of club or organization: Not on file    Attends meetings of clubs or organizations: Not on file    Relationship status: Not on file  . Intimate partner violence:    Fear of current or ex partner: Not on file    Emotionally abused: Not on file    Physically abused: Not on file    Forced sexual activity: Not on file  Other Topics Concern  . Not on file  Social  History Narrative   Lives alone   Works full time   Still drives and very active usually    Allergies  Allergen Reactions  . Celebrex [Celecoxib] Other (See Comments)    Reaction:  Anemia/leukopenia  . Diovan [Valsartan] Other (See Comments)    Reaction:  Increases potassium levels/acute renal failure     Family History  Problem Relation Age of Onset  . Cancer Mother   . Stroke Father   . Cancer Father   . Diabetes Sister   . Heart disease Sister        Heart Disease before  age 83  . Heart disease Sister   . Cancer Brother     Prior to Admission medications   Medication Sig Start Date End Date Taking? Authorizing Provider  acetaminophen (TYLENOL) 325 MG tablet Take 2 tablets (650 mg total) by mouth every 6 (six) hours as needed for mild pain (or Fever >/= 101). 06/29/16   Barton Dubois, MD  atorvastatin (LIPITOR) 40 MG tablet Take 40 mg by mouth at bedtime.     [provider]  buPROPion [facility]) 75 MG tablet Take 1 tablet at bedtime 04/28/17   Cameron Sprang, MD  cholecalciferol (VITAMIN D) 1000 UNITS tablet Take 1,000 Units by mouth daily.    [provider]  docusate sodium (COLACE) 100 MG capsule Take 100 mg by mouth as needed for mild constipation.    [provider]  feeding supplement, ENSURE ENLIVE, (ENSURE ENLIVE) LIQD Take 237 mLs by mouth 3 (three) times daily between meals. 06/29/16   Barton Dubois, MD  furosemide (LASIX) 40 MG tablet Take 40 mg by mouth as needed.    [provider]  gabapentin (NEURONTIN) 100 MG capsule take 4 capsules by mouth every NIGHT Patient taking differently: Take 400 mg by mouth at bedtime. take 4 capsules by mouth every NIGHT 01/06/17   Cameron Sprang, MD  HYDROcodone-acetaminophen (NORCO/VICODIN) 5-325 MG tablet Take 1-2 tablets by mouth every 6 (six) hours as needed for moderate pain. 06/29/16   Perkins, Alexzandrew L, PA-C  iron polysaccharides (NIFEREX) 150 MG capsule Take 1 capsule (150 mg total)  by mouth daily. 06/30/16   Barton Dubois, MD  levothyroxine (SYNTHROID, LEVOTHROID) 75 MCG tablet Take 75 mcg by mouth daily before breakfast.     [provider]  methocarbamol (ROBAXIN) 500 MG tablet Take 1 tablet (500 mg total) by mouth every 8 (eight) hours as needed for muscle spasms. 06/29/16   Perkins, Alexzandrew L, PA-C  metoprolol succinate (TOPROL-XL) 25 MG 24 hr tablet Take 25 mg by mouth daily.    [provider]  ondansetron (ZOFRAN) 4 MG tablet Take 1 tablet (4 mg total) by mouth every 8 (eight) hours as needed for nausea or vomiting. 05/13/17   Margarita Mail, PA-C  polyethylene glycol (MIRALAX / GLYCOLAX) packet Take 17 g by mouth daily. 06/29/16   Barton Dubois, MD  ranitidine (ZANTAC) 150 MG tablet Take 150 mg by mouth daily.    [provider]  senna-docusate (SENOKOT-S) 8.6-50 MG tablet Take 1 tablet by mouth at bedtime. Patient taking differently: Take 1 tablet by mouth as needed.  06/29/16   Barton Dubois, MD  TOVIAZ 8 MG TB24 tablet Take 8 mg by mouth at bedtime.     [provider]  traMADol (ULTRAM) 50 MG tablet Take 1-2 tablets (50-100 mg total) by mouth every 6 (six) hours as needed for moderate pain. 06/29/16   Perkins, Alexzandrew L, PA-C  warfarin (COUMADIN) 2.5 MG tablet Take 2.5 mg by mouth every evening.     [provider]    Physical Exam: Vitals:   06/12/17 1251 06/12/17 1257 06/12/17 1303 06/12/17 1500  BP:   (!) 151/73 127/61  Pulse:   81 (!) 40  Resp:   18 (!) 21  Temp:   98.2 F (36.8 C)   TempSrc:   Oral   SpO2: 97%  98% 100%  Weight:  54.4 kg (120 lb)    Height:  4\' 10"  (1.473 m)       General:  Appears slightly anxious  and comfortable  Lying in bed in no acute distress Eyes:  PERRL, EOMI, normal lids, iris he has a bruise right temporal area no swelling ENT:  grossly normal hearing, lips & tongue, because membranes are pink only slightly dry Neck:  no LAD, masses or thyromegaly Cardiovascular:  RRR,  sounds distant no m/r/g. No LE edema.  Respiratory:  CTA bilaterally, no w/r/r. Normal respiratory effort. Abdomen:  soft, ntnd, is a bowel sounds throughout no guarding or rebounding Skin:  no rash or induration seen on limited exam Musculoskeletal:  grossly normal tone BUE/BLE, good ROM, no bony abnormality Psychiatric:  grossly normal mood and affect, speech fluent and appropriate, AOx3 Neurologic:  Alert and oriented to self and place. Follows simple commands. Speech is clear bilateral grip 5 out of 5  Labs on Admission: I have personally reviewed following labs and imaging studies  CBC: Recent Labs  Lab 06/12/17 1301  WBC 5.5  NEUTROABS 3.4  HGB 11.4*  HCT 36.4  MCV 96.3  PLT 643   Basic Metabolic Panel: Recent Labs  Lab 06/12/17 1301 06/12/17 1411  NA 140  --   K 3.9  --   CL 107  --   CO2 23  --   GLUCOSE 96  --   BUN 14  --   CREATININE 0.85  --   CALCIUM 9.1  --   MG  --  1.9   GFR: Estimated Creatinine Clearance: 32.2 mL/min (by C-G formula based on SCr of 0.85 mg/dL). Liver Function Tests: No results for input(s): AST, ALT, ALKPHOS, BILITOT, PROT, ALBUMIN in the last 168 hours. No results for input(s): LIPASE, AMYLASE in the last 168 hours. No results for input(s): AMMONIA in the last 168 hours. Coagulation Profile: Recent Labs  Lab 06/12/17 1301  INR 2.11   Cardiac Enzymes: No results for input(s): CKTOTAL, CKMB, CKMBINDEX, TROPONINI in the last 168 hours. BNP (last 3 results) No results for input(s): PROBNP in the last 8760 hours. HbA1C: No results for input(s): HGBA1C in the last 72 hours. CBG: No results for input(s): GLUCAP in the last 168 hours. Lipid Profile: No results for input(s): CHOL, HDL, LDLCALC, TRIG, CHOLHDL, LDLDIRECT in the last 72 hours. Thyroid Function Tests: No results for input(s): TSH, T4TOTAL, FREET4, T3FREE, THYROIDAB in the last 72 hours. Anemia Panel: No results for input(s): VITAMINB12, FOLATE, FERRITIN, TIBC, IRON,  RETICCTPCT in the last 72 hours. Urine analysis:    Component Value Date/Time   COLORURINE YELLOW 01/19/2011 1220   APPEARANCEUR CLEAR 01/19/2011 1220   LABSPEC 1.020 01/19/2011 1220   PHURINE 6.5 01/19/2011 1220   GLUCOSEU NEGATIVE 01/19/2011 1220   HGBUR NEGATIVE 01/19/2011 1220   BILIRUBINUR NEGATIVE 01/19/2011 1220   KETONESUR NEGATIVE 01/19/2011 1220   PROTEINUR NEGATIVE 01/19/2011 1220   UROBILINOGEN 0.2 01/19/2011 1220   NITRITE NEGATIVE 01/19/2011 1220   LEUKOCYTESUR NEGATIVE 01/19/2011 1220    Creatinine Clearance: Estimated Creatinine Clearance: 32.2 mL/min (by C-G formula based on SCr of 0.85 mg/dL).  Sepsis Labs: @LABRCNTIP (procalcitonin:4,lacticidven:4) )No results found for this or any previous visit (from the past 240 hour(s)).   Radiological Exams on Admission: Ct Head Wo Contrast  Addendum Date: 06/12/2017   ADDENDUM REPORT: 06/12/2017 14:15 ADDENDUM: Upon further review, note made of a 3 mm acute right parietal SUBDURAL HEMATOMA. No significant mass effect. The findings were discussed with the ED physician. Electronically Signed   By: Lucrezia Europe M.D.   On: 06/12/2017 14:15   Result Date: 06/12/2017 CLINICAL DATA:  [place] EXAM: CT HEAD WITHOUT CONTRAST CT CERVICAL SPINE WITHOUT CONTRAST TECHNIQUE: Multidetector CT imaging of the head and cervical spine was performed following the standard protocol without intravenous contrast. Multiplanar CT image reconstructions of the cervical spine were also generated. COMPARISON:  05/12/2017 FINDINGS: CT HEAD FINDINGS Brain: Diffuse parenchymal atrophy. Patchy areas of hypoattenuation in deep and periventricular white matter bilaterally. Negative for acute intracranial hemorrhage, mass lesion, acute infarction, midline shift, or mass-effect. Acute infarct may be inapparent on noncontrast CT. Ventricles and sulci symmetric. Vascular: Atherosclerotic and physiologic intracranial calcifications. Skull: Normal. Negative for fracture or  focal lesion. Sinuses/Orbits: Fluid level in the right maxillary sinus. Fracture of the anterior and lateral walls of the right maxillary sinus extending to the lateral orbital wall and margin of the orbital floor, minimally displaced without entrapment. Minimally displaced fracture of the right zygomatic arch. Other: None CT CERVICAL SPINE FINDINGS Alignment: Normal Skull base and vertebrae: No fracture. Mild pannus around the C1-2 articulation. Soft tissues and spinal canal: No prevertebral soft tissue swelling or hematoma. Bilateral calcified carotid bifurcation plaque. Disc levels: Narrowing of interspaces C2-C7 with mild endplate spurring. Fusion across the C4-5 facets. Upper chest: Negative Other: Multiple missing teeth and restorations. IMPRESSION: 1. Minimally displaced right tripod fracture. 2. Negative for bleed or other acute intracranial process. 3. Atrophy and nonspecific white matter changes. 4.   Negative for cervical fracture or  dislocation. 5. Multilevel cervical spondylitic change as above. Electronically Signed: By: Lucrezia Europe M.D. On: 06/12/2017 14:02   Ct Cervical Spine Wo Contrast  Addendum Date: 06/12/2017   ADDENDUM REPORT: 06/12/2017 14:15 ADDENDUM: Upon further review, note made of a 3 mm acute right parietal SUBDURAL HEMATOMA. No significant mass effect. The findings were discussed with the ED physician. Electronically Signed   By: Lucrezia Europe M.D.   On: 06/12/2017 14:15   Result Date: 06/12/2017 CLINICAL DATA:  [place] EXAM: CT HEAD WITHOUT CONTRAST CT CERVICAL SPINE WITHOUT CONTRAST TECHNIQUE: Multidetector CT imaging of the head and cervical spine was performed following the standard protocol without intravenous contrast. Multiplanar CT image reconstructions of the cervical spine were also generated. COMPARISON:  05/12/2017 FINDINGS: CT HEAD FINDINGS Brain: Diffuse parenchymal atrophy. Patchy areas of hypoattenuation in deep and periventricular white matter bilaterally. Negative for  acute intracranial hemorrhage, mass lesion, acute infarction, midline shift, or mass-effect. Acute infarct may be inapparent on noncontrast CT. Ventricles and sulci symmetric. Vascular: Atherosclerotic and physiologic intracranial calcifications. Skull: Normal. Negative for fracture or focal lesion. Sinuses/Orbits: Fluid level in the right maxillary sinus. Fracture of the anterior and lateral walls of the right maxillary sinus extending to the lateral orbital wall and margin of the orbital floor, minimally displaced without entrapment. Minimally displaced fracture of the right zygomatic arch. Other: None CT CERVICAL SPINE FINDINGS Alignment: Normal Skull base and vertebrae: No fracture. Mild pannus around the C1-2 articulation. Soft tissues and spinal canal: No prevertebral soft tissue swelling or hematoma. Bilateral calcified carotid bifurcation plaque. Disc levels: Narrowing of interspaces C2-C7 with mild endplate spurring. Fusion across the C4-5 facets. Upper chest: Negative Other: Multiple missing teeth and restorations. IMPRESSION: 1. Minimally displaced right tripod fracture. 2. Negative for bleed or other acute intracranial process. 3. Atrophy and nonspecific white matter changes. 4.   Negative for cervical fracture or  dislocation. 5. Multilevel cervical spondylitic change as above. Electronically Signed: By: Lucrezia Europe M.D. On: 06/12/2017 14:02    EKG: Sinus rhythm Ventricular bigeminy  Assessment/Plan Principal Problem:   Subdural hematoma (HCC) Active  Problems:   Carotid artery disease (HCC)   Hypothyroidism   S/P mitral valve repair   PAF (paroxysmal atrial fibrillation) (HCC)   Fall   Moderate dementia with behavioral disturbance   Facial fracture (HCC)   Bradycardia   #1. Subdural hematoma. CT as noted above. Emergency department flat or spoke with Dr. Vertell Limber with neurosurgery who recommended admission and repeat CT in the morning as well as holding Coumadin. Neuro exam benign -Admit  to telemetry -Neuro checks -Hold Coumadin -Repeat CT in the morning  #2. Facial fracture.CT notes Fracture of the anterior and lateral walls of the right maxillary sinus extending to the lateral orbital wall and margin of the orbital floor, minimally displaced without entrapment. Minimally displaced fracture of the right zygomatic arch. -supportive therapy  #3. Bradycardia/abnormal ekg. ekg with SR ventricular bigeminy. May be contributing to frequent falls. Discussed with daughter who defers further cardiology work up at this time. Home medications include metoprolol. -hold metoprolol for now -Monitor on telemetry -doubt candidate for pacemaker  #4.Paroxysmal A. Fib/status post mitral valve repair. Patient is on Coumadin. EKG as noted above. INR 2.1. -Holding Coumadin for now -holding metoprolol for now as well secondary to #3 -Monitor  #5. Dementia. Chart review indicates under the care of neurology. Vascular dementia. Patient placed in a facility 5 weeks ago. Daughter reports she has been declining quickly over the last several months. She is mostly incontinent of bladder and bowel but is able to make her wants and needs known -Appears stable at baseline -Continue home meds  #6. Falls. Likely related to a combination of the above. -PT -orthostatic vital signs     DVT prophylaxis: scd  Code Status: dn4  Family Communication: daughter at bedside  Disposition Plan: facility  Consults called: [facility] neurosurgery  Admission status: obs    Radene Gunning MD [facility]  If 7PM-7AM, please contact night-coverage [email or web address] Password [email or web address]  06/12/2017, 4:21 PM

## 2017-06-12 NOTE — ED Notes (Signed)
Pt provided with pillow
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
## 2017-06-12 NOTE — ED Notes (Signed)
Dinner tray ordered for pt

## 2017-06-12 NOTE — ED Notes (Signed)
Pt helped to bedside commode with 2 staff assist for a BM

## 2017-06-12 NOTE — ED Notes (Signed)
Pt coming to treatment room from CT

## 2017-06-12 NOTE — ED Triage Notes (Signed)
Pt arrived from [facility] where she had a witnessed fall around noon today hitting her head on the bedside table. Pt A&O X3, disoriented to time. Staff denies LOC, pt aware she fell, does not remember event.

## 2017-06-12 NOTE — ED Notes (Signed)
ED Provider at bedside. 

## 2017-06-12 NOTE — ED Provider Notes (Signed)
Patient placed in Quick Look pathway, seen and evaluated   Chief Complaint: fall  HPI:   84 y.o. female who presents for evaluation after fall.  Patient is a resident at [facility] where EMS states she had a witnessed fall around 12 PM this afternoon.  EMS reports the patient hit her head on the bedside table.  Patient does not recall falling.  Staff denies any LOC.  Patient is currently on Coumadin.    ROS: Fall  Physical Exam:   Gen: No distress  Neuro: Awake and Alert  Skin: Warm    Focused Exam: Tenderness palpation noted to the right temporal region with overlying abrasions, ecchymosis.   Initiation of care has begun. The patient has been counseled on the process, plan, and necessity for staying for the completion/evaluation, and the remainder of the medical screening examination    Desma Mcgregor 06/12/17 [place], Erin, MD 06/12/17 [id number]

## 2017-06-13 ENCOUNTER — Observation Stay (HOSPITAL_COMMUNITY): Payer: PPO

## 2017-06-13 DIAGNOSIS — S0240CA Maxillary fracture, right side, initial encounter for closed fracture: Secondary | ICD-10-CM

## 2017-06-13 DIAGNOSIS — Z9889 Other specified postprocedural states: Secondary | ICD-10-CM

## 2017-06-13 DIAGNOSIS — E039 Hypothyroidism, unspecified: Secondary | ICD-10-CM

## 2017-06-13 DIAGNOSIS — I480 Paroxysmal atrial fibrillation: Secondary | ICD-10-CM

## 2017-06-13 DIAGNOSIS — F0391 Unspecified dementia with behavioral disturbance: Secondary | ICD-10-CM

## 2017-06-13 DIAGNOSIS — I779 Disorder of arteries and arterioles, unspecified: Secondary | ICD-10-CM | POA: Diagnosis not present

## 2017-06-13 DIAGNOSIS — W19XXXA Unspecified fall, initial encounter: Secondary | ICD-10-CM

## 2017-06-13 DIAGNOSIS — S065X9A Traumatic subdural hemorrhage with loss of consciousness of unspecified duration, initial encounter: Secondary | ICD-10-CM | POA: Diagnosis not present

## 2017-06-13 DIAGNOSIS — S0990XA Unspecified injury of head, initial encounter: Secondary | ICD-10-CM | POA: Diagnosis not present

## 2017-06-13 LAB — URINE CULTURE: Culture: NO GROWTH

## 2017-06-13 LAB — CBC
HEMATOCRIT: 31.9 % — AB (ref 36.0–46.0)
HEMOGLOBIN: 10.1 g/dL — AB (ref 12.0–15.0)
MCH: 30.3 pg (ref 26.0–34.0)
MCHC: 31.7 g/dL (ref 30.0–36.0)
MCV: 95.8 fL (ref 78.0–100.0)
Platelets: 146 10*3/uL — ABNORMAL LOW (ref 150–400)
RBC: 3.33 MIL/uL — AB (ref 3.87–5.11)
RDW: 14.5 % (ref 11.5–15.5)
WBC: 4.6 10*3/uL (ref 4.0–10.5)

## 2017-06-13 LAB — PROTIME-INR
INR: 1.96
Prothrombin Time: 22.2 seconds — ABNORMAL HIGH (ref 11.4–15.2)

## 2017-06-13 LAB — MRSA PCR SCREENING: MRSA by PCR: NEGATIVE

## 2017-06-13 MED ORDER — KETOROLAC TROMETHAMINE 30 MG/ML IJ SOLN
15.0000 mg | Freq: Once | INTRAMUSCULAR | Status: AC
  Administered 2017-06-13: 15 mg via INTRAVENOUS
  Filled 2017-06-13: qty 1

## 2017-06-13 NOTE — Discharge Summary (Addendum)
Physician Discharge Summary  Desiree [name] DOB: 1925-01-03 DOA: 06/12/2017  PCP: London Pepper, MD  Admit date: 06/12/2017 Discharge date: 06/13/2017  Admitted From: ALF Disposition: ALF   Recommendations for Outpatient Follow-up:  1. Follow up with PCP in 1-2 weeks 2. Holding coumadin with subdural hematoma. Timing of restart per cardiology and/or PCP.  Home Health: None new Equipment/Devices: None new Discharge Condition: Stable CODE STATUS: DNR Diet recommendation: Heart healthy  Brief/Interim Summary: Desiree [name] is a 84 y.o. female with a history of dementia, PAF on coumadin, MVR in 2012 who presented after a witnessed mechanical fall at ALF. On evaluation she was well appearing without complaints. Work up including CT head which showed a small SDH which was monitored overnight with a repeat CT which showed stability. She has developed no neurological symptoms and neurosurgery recommended discontinuing coumadin. PT recommended returning to ALF hopefully with increased assistance.   Discharge Diagnoses:  Principal Problem:   Subdural hematoma (HCC) Active Problems:   Carotid artery disease (HCC)   Hypothyroidism   S/P mitral valve repair   PAF (paroxysmal atrial fibrillation) (HCC)   Fall   Moderate dementia with behavioral disturbance   Facial fracture (HCC)   Bradycardia  Subdural hematoma: CT as noted below, stable on recheck. Discussed with Dr. Vertell Limber, neurosurgery, who recommended no further surveillance/monitoring and holding coumadin. Neuro exam benign.   Traumatic closed right maxillary sinus, lateral orbital wall fracture: CT noted fracture of the anterior and lateral walls of the right maxillary sinus extending to the lateral orbital wall and margin of the orbital floor, minimally displaced without entrapment. Minimally displaced fracture of the right zygomatic arch. - No indication for invasive therapy, continue with pain control.   PAF: Has not had  documented AFib since heart surgery in 2012. Has had ventricular bigeminy on ECG here in addition to NSR. This may be contributing to falls. Discussed with daughter who defers further cardiology work up at this time.  - Continue metoprolol.  - Holding coumadin as above.   Vascular dementia: with recent decline including increasing falls. Followed by neurology as outpatient.  - At baseline. Continue home medications.  Other chronic medical conditions were stable and no changes in management were made.   Discharge Instructions Discharge Instructions    Diet - low sodium heart healthy   Complete by:  As directed    Discharge instructions   Complete by:  As directed    Stop taking coumadin until you follow up with Dr. Wynonia Lawman in 2 weeks.   Increase activity slowly   Complete by:  As directed      Allergies as of 06/13/2017      Reactions   Celebrex [celecoxib] Other (See Comments)   Reaction:  Anemia/leukopenia   Diovan [valsartan] Other (See Comments)   Reaction:  Increases potassium levels/acute renal failure       Medication List    STOP taking these medications   warfarin 2 MG tablet Commonly known as:  COUMADIN     TAKE these medications   atorvastatin 40 MG tablet Commonly known as:  LIPITOR Take 40 mg by mouth at bedtime.   buPROPion 75 MG tablet Commonly known as:  WELLBUTRIN Take 1 tablet at bedtime   cholecalciferol 1000 units tablet Commonly known as:  VITAMIN D Take 1,000 Units by mouth daily.   docusate sodium 100 MG capsule Commonly known as:  COLACE Take 100 mg by mouth daily.   feeding supplement (ENSURE ENLIVE) Liqd Take  237 mLs by mouth 3 (three) times daily between meals.   furosemide 40 MG tablet Commonly known as:  LASIX Take 40 mg by mouth as needed for fluid or edema.   gabapentin 100 MG capsule Commonly known as:  NEURONTIN take 4 capsules by mouth every NIGHT What changed:    how much to take  how to take this  when to take  this  additional instructions   HYDROcodone-acetaminophen 10-325 MG tablet Commonly known as:  NORCO Take 1 tablet by mouth 3 (three) times daily.   HYDROcodone-acetaminophen 5-325 MG tablet Commonly known as:  NORCO/VICODIN Take 1-2 tablets by mouth every 6 (six) hours as needed for moderate pain.   iron polysaccharides 150 MG capsule Commonly known as:  NIFEREX Take 1 capsule (150 mg total) by mouth daily.   levothyroxine 75 MCG tablet Commonly known as:  SYNTHROID, LEVOTHROID Take 75 mcg by mouth daily before breakfast.   methocarbamol 500 MG tablet Commonly known as:  ROBAXIN Take 1 tablet (500 mg total) by mouth every 8 (eight) hours as needed for muscle spasms.   metoprolol succinate 25 MG 24 hr tablet Commonly known as:  TOPROL-XL Take 25 mg by mouth daily.   neomycin-bacitracin-polymyxin [phone] ointment Apply 1 application topically every evening. To skin tear until healed   ondansetron 4 MG tablet Commonly known as:  ZOFRAN Take 1 tablet (4 mg total) by mouth every 8 (eight) hours as needed for nausea or vomiting.   polyethylene glycol packet Commonly known as:  MIRALAX / GLYCOLAX Take 17 g by mouth daily.   ranitidine 150 MG tablet Commonly known as:  ZANTAC Take 150 mg by mouth at bedtime.   senna-docusate 8.6-50 MG tablet Commonly known as:  Senokot-S Take 1 tablet by mouth at bedtime. What changed:  when to take this   SYSTANE OP Apply 2 drops to eye every 6 (six) hours as needed (dry eyes).   TOVIAZ 8 MG Tb24 tablet Generic drug:  fesoterodine Take 8 mg by mouth at bedtime.   traMADol 50 MG tablet Commonly known as:  ULTRAM Take 1-2 tablets (50-100 mg total) by mouth every 6 (six) hours as needed for moderate pain.      Follow-up Information    London Pepper, MD. Schedule an appointment as soon as possible for a visit in 1 week(s).   Specialty:  Family Medicine       Jacolyn Reedy, MD. Call in 2 week(s).   Specialty:   Cardiology Why:  to discuss restarting coumadin Contact information: [address] [place] [address] [phone]          Allergies  Allergen Reactions  . Celebrex [Celecoxib] Other (See Comments)    Reaction:  Anemia/leukopenia  . Diovan [Valsartan] Other (See Comments)    Reaction:  Increases potassium levels/acute renal failure     Consultations:  Neurosurgery, Vertell Limber  Procedures/Studies: Ct Head Wo Contrast  Result Date: 06/13/2017 CLINICAL DATA:  Recent head trauma EXAM: CT HEAD WITHOUT CONTRAST TECHNIQUE: Contiguous axial images were obtained from the base of the skull through the vertex without intravenous contrast. COMPARISON:  Head CT 06/12/2017 FINDINGS: Brain: Unchanged 3 mm posterior right parietal subdural hematoma. No associated mass effect. No intraparenchymal hemorrhage. Otherwise normal appearance of the brain parenchyma and extra axial spaces for age. Vascular: Atherosclerotic calcification of the vertebral and internal carotid arteries at the skull base. No hyperdense vessel. Skull: Incompletely visualized right zygomaticomaxillary complex fracture. Sinuses/Orbits: No sinus fluid levels  or advanced mucosal thickening. No mastoid effusion. Normal orbits. IMPRESSION: Unchanged 3 mm right parietal subdural hematoma. Electronically Signed   By: Ulyses Jarred M.D.   On: 06/13/2017 01:22   Ct Head Wo Contrast  Addendum Date: 06/12/2017   ADDENDUM REPORT: 06/12/2017 14:15 ADDENDUM: Upon further review, note made of a 3 mm acute right parietal SUBDURAL HEMATOMA. No significant mass effect. The findings were discussed with the ED physician. Electronically Signed   By: Lucrezia Europe M.D.   On: 06/12/2017 14:15   Result Date: 06/12/2017 CLINICAL DATA:  [place] EXAM: CT HEAD WITHOUT CONTRAST CT CERVICAL SPINE WITHOUT CONTRAST TECHNIQUE: Multidetector CT imaging of the head and cervical spine was performed following the standard protocol without intravenous  contrast. Multiplanar CT image reconstructions of the cervical spine were also generated. COMPARISON:  05/12/2017 FINDINGS: CT HEAD FINDINGS Brain: Diffuse parenchymal atrophy. Patchy areas of hypoattenuation in deep and periventricular white matter bilaterally. Negative for acute intracranial hemorrhage, mass lesion, acute infarction, midline shift, or mass-effect. Acute infarct may be inapparent on noncontrast CT. Ventricles and sulci symmetric. Vascular: Atherosclerotic and physiologic intracranial calcifications. Skull: Normal. Negative for fracture or focal lesion. Sinuses/Orbits: Fluid level in the right maxillary sinus. Fracture of the anterior and lateral walls of the right maxillary sinus extending to the lateral orbital wall and margin of the orbital floor, minimally displaced without entrapment. Minimally displaced fracture of the right zygomatic arch. Other: None CT CERVICAL SPINE FINDINGS Alignment: Normal Skull base and vertebrae: No fracture. Mild pannus around the C1-2 articulation. Soft tissues and spinal canal: No prevertebral soft tissue swelling or hematoma. Bilateral calcified carotid bifurcation plaque. Disc levels: Narrowing of interspaces C2-C7 with mild endplate spurring. Fusion across the C4-5 facets. Upper chest: Negative Other: Multiple missing teeth and restorations. IMPRESSION: 1. Minimally displaced right tripod fracture. 2. Negative for bleed or other acute intracranial process. 3. Atrophy and nonspecific white matter changes. 4.   Negative for cervical fracture or  dislocation. 5. Multilevel cervical spondylitic change as above. Electronically Signed: By: Lucrezia Europe M.D. On: 06/12/2017 14:02   Ct Cervical Spine Wo Contrast  Addendum Date: 06/12/2017   ADDENDUM REPORT: 06/12/2017 14:15 ADDENDUM: Upon further review, note made of a 3 mm acute right parietal SUBDURAL HEMATOMA. No significant mass effect. The findings were discussed with the ED physician. Electronically Signed   By:  Lucrezia Europe M.D.   On: 06/12/2017 14:15   Result Date: 06/12/2017 CLINICAL DATA:  [place] EXAM: CT HEAD WITHOUT CONTRAST CT CERVICAL SPINE WITHOUT CONTRAST TECHNIQUE: Multidetector CT imaging of the head and cervical spine was performed following the standard protocol without intravenous contrast. Multiplanar CT image reconstructions of the cervical spine were also generated. COMPARISON:  05/12/2017 FINDINGS: CT HEAD FINDINGS Brain: Diffuse parenchymal atrophy. Patchy areas of hypoattenuation in deep and periventricular white matter bilaterally. Negative for acute intracranial hemorrhage, mass lesion, acute infarction, midline shift, or mass-effect. Acute infarct may be inapparent on noncontrast CT. Ventricles and sulci symmetric. Vascular: Atherosclerotic and physiologic intracranial calcifications. Skull: Normal. Negative for fracture or focal lesion. Sinuses/Orbits: Fluid level in the right maxillary sinus. Fracture of the anterior and lateral walls of the right maxillary sinus extending to the lateral orbital wall and margin of the orbital floor, minimally displaced without entrapment. Minimally displaced fracture of the right zygomatic arch. Other: None CT CERVICAL SPINE FINDINGS Alignment: Normal Skull base and vertebrae: No fracture. Mild pannus around the C1-2 articulation. Soft tissues and spinal canal: No prevertebral soft tissue swelling or hematoma. Bilateral  calcified carotid bifurcation plaque. Disc levels: Narrowing of interspaces C2-C7 with mild endplate spurring. Fusion across the C4-5 facets. Upper chest: Negative Other: Multiple missing teeth and restorations. IMPRESSION: 1. Minimally displaced right tripod fracture. 2. Negative for bleed or other acute intracranial process. 3. Atrophy and nonspecific white matter changes. 4.   Negative for cervical fracture or  dislocation. 5. Multilevel cervical spondylitic change as above. Electronically Signed: By: Lucrezia Europe M.D. On: 06/12/2017 14:02      Subjective: Feels well, unsure why she's in the hospital. No N/V, eating breakfast. Worked with PT who recommended return to ALF.   Discharge Exam: Vitals:   06/13/17 0700 06/13/17 1209  BP: (!) 145/63 (!) 147/74  Pulse: 62 93  Resp: 18 18  Temp: 97.8 F (36.6 C) 97.6 F (36.4 C)  SpO2: 97% 99%   General: Elderly confused female in no distress Cardiovascular: RRR, no murmur, JVD, or LE edema Respiratory: Nonlabored and clear Abdominal: Soft, NT, ND, bowel sounds + Neuro: Alert, oriented to person and place only. No focal deficits in extremities or cranial nerves.  Skin: Right temporal ecchymosis is stable.   Labs: BNP (last 3 results) Recent Labs    06/25/16 2115  BNP 509.3*   Basic Metabolic Panel: Recent Labs  Lab 06/12/17 1301 06/12/17 1411  NA 140  --   K 3.9  --   CL 107  --   CO2 23  --   GLUCOSE 96  --   BUN 14  --   CREATININE 0.85  --   CALCIUM 9.1  --   MG  --  1.9   Liver Function Tests: No results for input(s): AST, ALT, ALKPHOS, BILITOT, PROT, ALBUMIN in the last 168 hours. No results for input(s): LIPASE, AMYLASE in the last 168 hours. No results for input(s): AMMONIA in the last 168 hours. CBC: Recent Labs  Lab 06/12/17 1301 06/13/17 0747  WBC 5.5 4.6  NEUTROABS 3.4  --   HGB 11.4* 10.1*  HCT 36.4 31.9*  MCV 96.3 95.8  PLT 159 146*   Cardiac Enzymes: No results for input(s): CKTOTAL, CKMB, CKMBINDEX, TROPONINI in the last 168 hours. BNP: Invalid input(s): POCBNP CBG: No results for input(s): GLUCAP in the last 168 hours. D-Dimer No results for input(s): DDIMER in the last 72 hours. Hgb A1c No results for input(s): HGBA1C in the last 72 hours. Lipid Profile No results for input(s): CHOL, HDL, LDLCALC, TRIG, CHOLHDL, LDLDIRECT in the last 72 hours. Thyroid function studies Recent Labs    06/12/17 2018  TSH 1.534   Anemia work up No results for input(s): VITAMINB12, FOLATE, FERRITIN, TIBC, IRON, RETICCTPCT in the last  72 hours. Urinalysis    Component Value Date/Time   COLORURINE YELLOW 06/12/2017 1702   APPEARANCEUR CLEAR 06/12/2017 1702   LABSPEC 1.011 06/12/2017 1702   PHURINE 6.0 06/12/2017 1702   GLUCOSEU NEGATIVE 06/12/2017 1702   HGBUR NEGATIVE 06/12/2017 1702   BILIRUBINUR NEGATIVE 06/12/2017 1702   KETONESUR NEGATIVE 06/12/2017 1702   PROTEINUR NEGATIVE 06/12/2017 1702   UROBILINOGEN 0.2 01/19/2011 1220   NITRITE NEGATIVE 06/12/2017 1702   LEUKOCYTESUR NEGATIVE 06/12/2017 1702    Microbiology Recent Results (from the past 240 hour(s))  MRSA PCR Screening     Status: None   Collection Time: 06/12/17 10:34 PM  Result Value Ref Range Status   MRSA by PCR NEGATIVE NEGATIVE Final    Comment:        The GeneXpert MRSA Assay (FDA approved for NASAL  specimens only), is one component of a comprehensive MRSA colonization surveillance program. It is not intended to diagnose MRSA infection nor to guide or monitor treatment for MRSA infections. Performed at [facility], [place] [address] [place], [place] [address]     Time coordinating discharge: Approximately 40 minutes  Vance Gather, MD  [facility] 06/13/2017, 1:01 PM Pager [phone]

## 2017-06-13 NOTE — Evaluation (Signed)
Physical Therapy Evaluation Patient Details Name: Desiree [name] MRN: [id number] DOB: [birth year] Today's Date: 06/13/2017   History of Present Illness  Desiree [name] is a very pleasantly demented 84 y.o. female with medical history significant for atrial fibrillation on Coumadin, mitral valve repair, hypertension, hyperlipidemia, right carotid stenosis, chronic back pain, vascular dementia presents to emergency Department chief complaint of fall. Initial evaluation reveals small subdural hematoma  Clinical Impression  Orders received for PT evaluation. Patient demonstrates deficits in functional mobility as indicated below. Will benefit from continued skilled PT to address deficits and maximize function. Will see as indicated and progress as tolerated.  Recommend return to ALF with PT services and increased assist as needed.    Follow Up Recommendations (return to ALF with increased assist)    Equipment Recommendations  None recommended by PT    Recommendations for Other Services       Precautions / Restrictions Precautions Precautions: Fall      Mobility  Bed Mobility Overal bed mobility: Needs Assistance Bed Mobility: Rolling;Supine to Sit Rolling: Min assist   Supine to sit: Min assist     General bed mobility comments: min assist to come to righ tside and elevate trunk to upright at EOB  Transfers Overall transfer level: Needs assistance Equipment used: Rolling walker (2 wheeled) Transfers: Sit to/from Stand Sit to Stand: Mod assist         General transfer comment: moderatre assist to power to upright, patient with poor LE positioning despite cues. Increased time required and difficulty with posterior bias during initial stand  Ambulation/Gait Ambulation/Gait assistance: Min assist Ambulation Distance (Feet): 12 Feet Assistive device: Rolling walker (2 wheeled) Gait Pattern/deviations: Decreased stride length;Step-to pattern;Shuffle;Antalgic;Trunk flexed Gait  velocity: decreased Gait velocity interpretation: <1.31 ft/sec, indicative of household ambulator General Gait Details: significantly flexed posture, multi modal cues for upright (as able), cues for direction and assist for stability and walker Pharmacist, hospital    Modified Rankin (Stroke Patients Only)       Balance Overall balance assessment: History of Falls                                           Pertinent Vitals/Pain Pain Assessment: Faces Faces Pain Scale: Hurts little more Pain Location: knees per patient Pain Descriptors / Indicators: Sore Pain Intervention(s): Monitored during session    Home Living Family/patient expects to be discharged to:: Skilled nursing facility                      Prior Function Level of Independence: Needs assistance   Gait / Transfers Assistance Needed: Reports she was at ALF, however, was able to ambulate on her "good days" per daughter  ADL's / Homemaking Assistance Needed: Assited with ADLs from staff.   Comments: frequent falls     Hand Dominance   Dominant Hand: Right    Extremity/Trunk Assessment   Upper Extremity Assessment Upper Extremity Assessment: Generalized weakness    Lower Extremity Assessment Lower Extremity Assessment: Generalized weakness    Cervical / Trunk Assessment Cervical / Trunk Assessment: Kyphotic  Communication   Communication: HOH  Cognition Arousal/Alertness: Awake/alert Behavior During Therapy: Flat affect Overall Cognitive Status: History of cognitive impairments - at baseline  General Comments      Exercises     Assessment/Plan    PT Assessment Patient needs continued PT services  PT Problem List Decreased strength;Decreased activity tolerance;Decreased mobility;Decreased balance;Decreased coordination;Decreased cognition;Decreased safety awareness;Pain        PT Treatment Interventions DME instruction;Gait training;Stair training;Functional mobility training;Therapeutic activities;Therapeutic exercise;Balance training;Patient/family education    PT Goals (Current goals can be found in the Care Plan section)  Acute Rehab PT Goals Patient Stated Goal: to go home PT Goal Formulation: With patient/family Time For Goal Achievement: 06/27/17 Potential to Achieve Goals: Fair    Frequency Min 2X/week   Barriers to discharge        Co-evaluation               AM-PAC PT "6 Clicks" Daily Activity  Outcome Measure Difficulty turning over in bed (including adjusting bedclothes, sheets and blankets)?: Unable Difficulty moving from lying on back to sitting on the side of the bed? : Unable Difficulty sitting down on and standing up from a chair with arms (e.g., wheelchair, bedside commode, etc,.)?: Unable Help needed moving to and from a bed to chair (including a wheelchair)?: A Lot Help needed walking in hospital room?: A Little Help needed climbing 3-5 steps with a railing? : Total 6 Click Score: 9    End of Session Equipment Utilized During Treatment: Gait belt Activity Tolerance: Patient limited by fatigue Patient left: in chair;with call bell/phone within reach;with family/visitor present Nurse Communication: Mobility status PT Visit Diagnosis: Difficulty in walking, not elsewhere classified (R26.2)    Time: 0812-0832 PT Time Calculation (min) (ACUTE ONLY): 20 min   Charges:   PT Evaluation $PT Eval Moderate Complexity: 1 Mod     PT G Codes:        Alben Deeds, PT DPT  Board Certified Neurologic Specialist [phone]   Duncan Dull 06/13/2017, 9:57 AM
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
## 2017-06-13 NOTE — Clinical Social Work Note (Addendum)
Document's faxed to [facility] on [place] (ALF). Pt's family to transport. RN to call report [phone].   [place], [place]

## 2017-06-13 NOTE — Progress Notes (Signed)
Patient being discharged home. Discharge instructions were reviewed with the daughter. Patient is being transported to [place] by family.

## 2017-06-18 DIAGNOSIS — G9009 Other idiopathic peripheral autonomic neuropathy: Secondary | ICD-10-CM | POA: Diagnosis not present

## 2017-06-18 DIAGNOSIS — E039 Hypothyroidism, unspecified: Secondary | ICD-10-CM | POA: Diagnosis not present

## 2017-06-18 DIAGNOSIS — I2510 Atherosclerotic heart disease of native coronary artery without angina pectoris: Secondary | ICD-10-CM | POA: Diagnosis not present

## 2017-06-18 DIAGNOSIS — K5901 Slow transit constipation: Secondary | ICD-10-CM | POA: Diagnosis not present

## 2017-06-18 DIAGNOSIS — N3281 Overactive bladder: Secondary | ICD-10-CM | POA: Diagnosis not present

## 2017-06-18 DIAGNOSIS — E559 Vitamin D deficiency, unspecified: Secondary | ICD-10-CM | POA: Diagnosis not present

## 2017-06-18 DIAGNOSIS — I480 Paroxysmal atrial fibrillation: Secondary | ICD-10-CM | POA: Diagnosis not present

## 2017-06-18 DIAGNOSIS — I110 Hypertensive heart disease with heart failure: Secondary | ICD-10-CM | POA: Diagnosis not present

## 2017-06-18 DIAGNOSIS — K219 Gastro-esophageal reflux disease without esophagitis: Secondary | ICD-10-CM | POA: Diagnosis not present

## 2017-06-18 DIAGNOSIS — M150 Primary generalized (osteo)arthritis: Secondary | ICD-10-CM | POA: Diagnosis not present

## 2017-06-18 DIAGNOSIS — E782 Mixed hyperlipidemia: Secondary | ICD-10-CM | POA: Diagnosis not present

## 2017-06-18 DIAGNOSIS — N390 Urinary tract infection, site not specified: Secondary | ICD-10-CM | POA: Diagnosis not present

## 2017-06-23 DIAGNOSIS — M6281 Muscle weakness (generalized): Secondary | ICD-10-CM | POA: Diagnosis not present

## 2017-06-23 DIAGNOSIS — Z7901 Long term (current) use of anticoagulants: Secondary | ICD-10-CM | POA: Diagnosis not present

## 2017-06-23 DIAGNOSIS — I10 Essential (primary) hypertension: Secondary | ICD-10-CM | POA: Diagnosis not present

## 2017-06-23 DIAGNOSIS — Z9181 History of falling: Secondary | ICD-10-CM | POA: Diagnosis not present

## 2017-06-23 DIAGNOSIS — F0150 Vascular dementia without behavioral disturbance: Secondary | ICD-10-CM | POA: Diagnosis not present

## 2017-06-23 DIAGNOSIS — M545 Low back pain: Secondary | ICD-10-CM | POA: Diagnosis not present

## 2017-06-23 DIAGNOSIS — I69918 Other symptoms and signs involving cognitive functions following unspecified cerebrovascular disease: Secondary | ICD-10-CM | POA: Diagnosis not present

## 2017-06-23 DIAGNOSIS — H8110 Benign paroxysmal vertigo, unspecified ear: Secondary | ICD-10-CM | POA: Diagnosis not present

## 2017-06-23 DIAGNOSIS — I739 Peripheral vascular disease, unspecified: Secondary | ICD-10-CM | POA: Diagnosis not present

## 2017-06-24 DIAGNOSIS — I480 Paroxysmal atrial fibrillation: Secondary | ICD-10-CM | POA: Diagnosis not present

## 2017-06-24 DIAGNOSIS — I119 Hypertensive heart disease without heart failure: Secondary | ICD-10-CM | POA: Diagnosis not present

## 2017-06-24 DIAGNOSIS — I511 Rupture of chordae tendineae, not elsewhere classified: Secondary | ICD-10-CM | POA: Diagnosis not present

## 2017-06-24 DIAGNOSIS — E785 Hyperlipidemia, unspecified: Secondary | ICD-10-CM | POA: Diagnosis not present

## 2017-06-24 DIAGNOSIS — I6529 Occlusion and stenosis of unspecified carotid artery: Secondary | ICD-10-CM | POA: Diagnosis not present

## 2017-06-24 DIAGNOSIS — S065X0D Traumatic subdural hemorrhage without loss of consciousness, subsequent encounter: Secondary | ICD-10-CM | POA: Diagnosis not present

## 2017-06-24 DIAGNOSIS — Z954 Presence of other heart-valve replacement: Secondary | ICD-10-CM | POA: Diagnosis not present

## 2017-06-24 DIAGNOSIS — Z7901 Long term (current) use of anticoagulants: Secondary | ICD-10-CM | POA: Diagnosis not present

## 2017-06-24 DIAGNOSIS — G44309 Post-traumatic headache, unspecified, not intractable: Secondary | ICD-10-CM | POA: Diagnosis not present

## 2017-06-24 DIAGNOSIS — E039 Hypothyroidism, unspecified: Secondary | ICD-10-CM | POA: Diagnosis not present

## 2017-06-24 DIAGNOSIS — I739 Peripheral vascular disease, unspecified: Secondary | ICD-10-CM | POA: Diagnosis not present

## 2017-06-25 DIAGNOSIS — S90822A Blister (nonthermal), left foot, initial encounter: Secondary | ICD-10-CM | POA: Diagnosis not present

## 2017-06-25 DIAGNOSIS — N390 Urinary tract infection, site not specified: Secondary | ICD-10-CM | POA: Diagnosis not present

## 2017-06-29 ENCOUNTER — Encounter: Payer: Self-pay | Admitting: Sports Medicine

## 2017-06-29 ENCOUNTER — Ambulatory Visit (INDEPENDENT_AMBULATORY_CARE_PROVIDER_SITE_OTHER): Payer: PPO | Admitting: Sports Medicine

## 2017-06-29 DIAGNOSIS — L600 Ingrowing nail: Secondary | ICD-10-CM

## 2017-06-29 DIAGNOSIS — M79675 Pain in left toe(s): Secondary | ICD-10-CM

## 2017-06-29 DIAGNOSIS — B351 Tinea unguium: Secondary | ICD-10-CM | POA: Diagnosis not present

## 2017-06-29 DIAGNOSIS — M79674 Pain in right toe(s): Secondary | ICD-10-CM

## 2017-06-29 DIAGNOSIS — Z7901 Long term (current) use of anticoagulants: Secondary | ICD-10-CM

## 2017-06-29 DIAGNOSIS — S90822A Blister (nonthermal), left foot, initial encounter: Secondary | ICD-10-CM
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
## 2017-07-02 DIAGNOSIS — M150 Primary generalized (osteo)arthritis: Secondary | ICD-10-CM | POA: Diagnosis not present

## 2017-07-02 DIAGNOSIS — G9009 Other idiopathic peripheral autonomic neuropathy: Secondary | ICD-10-CM | POA: Diagnosis not present

## 2017-07-02 DIAGNOSIS — G894 Chronic pain syndrome: Secondary | ICD-10-CM | POA: Diagnosis not present

## 2017-07-09 DIAGNOSIS — N390 Urinary tract infection, site not specified: Secondary | ICD-10-CM | POA: Diagnosis not present

## 2017-07-09 DIAGNOSIS — S91302A Unspecified open wound, left foot, initial encounter: Secondary | ICD-10-CM | POA: Diagnosis not present

## 2017-07-09 DIAGNOSIS — G9009 Other idiopathic peripheral autonomic neuropathy: Secondary | ICD-10-CM | POA: Diagnosis not present

## 2017-07-09 DIAGNOSIS — I480 Paroxysmal atrial fibrillation: Secondary | ICD-10-CM | POA: Diagnosis not present

## 2017-07-09 DIAGNOSIS — E782 Mixed hyperlipidemia: Secondary | ICD-10-CM | POA: Diagnosis not present

## 2017-07-09 DIAGNOSIS — I2510 Atherosclerotic heart disease of native coronary artery without angina pectoris: Secondary | ICD-10-CM | POA: Diagnosis not present

## 2017-07-09 DIAGNOSIS — I110 Hypertensive heart disease with heart failure: Secondary | ICD-10-CM | POA: Diagnosis not present

## 2017-07-09 DIAGNOSIS — K219 Gastro-esophageal reflux disease without esophagitis: Secondary | ICD-10-CM | POA: Diagnosis not present

## 2017-07-09 DIAGNOSIS — K5901 Slow transit constipation: Secondary | ICD-10-CM | POA: Diagnosis not present

## 2017-07-09 DIAGNOSIS — E039 Hypothyroidism, unspecified: Secondary | ICD-10-CM | POA: Diagnosis not present

## 2017-07-09 DIAGNOSIS — N3281 Overactive bladder: Secondary | ICD-10-CM | POA: Diagnosis not present

## 2017-07-09 DIAGNOSIS — M150 Primary generalized (osteo)arthritis: Secondary | ICD-10-CM | POA: Diagnosis not present

## 2017-07-14 DIAGNOSIS — I739 Peripheral vascular disease, unspecified: Secondary | ICD-10-CM | POA: Diagnosis not present

## 2017-07-14 DIAGNOSIS — F0150 Vascular dementia without behavioral disturbance: Secondary | ICD-10-CM | POA: Diagnosis not present

## 2017-07-14 DIAGNOSIS — R296 Repeated falls: Secondary | ICD-10-CM | POA: Diagnosis not present

## 2017-07-14 DIAGNOSIS — L89622 Pressure ulcer of left heel, stage 2: Secondary | ICD-10-CM | POA: Diagnosis not present

## 2017-07-14 DIAGNOSIS — I10 Essential (primary) hypertension: Secondary | ICD-10-CM | POA: Diagnosis not present

## 2017-07-22 DIAGNOSIS — E039 Hypothyroidism, unspecified: Secondary | ICD-10-CM | POA: Diagnosis not present

## 2017-07-22 DIAGNOSIS — G44309 Post-traumatic headache, unspecified, not intractable: Secondary | ICD-10-CM | POA: Diagnosis not present

## 2017-07-22 DIAGNOSIS — I511 Rupture of chordae tendineae, not elsewhere classified: Secondary | ICD-10-CM | POA: Diagnosis not present

## 2017-07-22 DIAGNOSIS — I6529 Occlusion and stenosis of unspecified carotid artery: Secondary | ICD-10-CM | POA: Diagnosis not present

## 2017-07-22 DIAGNOSIS — I480 Paroxysmal atrial fibrillation: Secondary | ICD-10-CM | POA: Diagnosis not present

## 2017-07-22 DIAGNOSIS — Z954 Presence of other heart-valve replacement: Secondary | ICD-10-CM | POA: Diagnosis not present

## 2017-07-22 DIAGNOSIS — Z7901 Long term (current) use of anticoagulants: Secondary | ICD-10-CM | POA: Diagnosis not present

## 2017-07-22 DIAGNOSIS — I119 Hypertensive heart disease without heart failure: Secondary | ICD-10-CM | POA: Diagnosis not present

## 2017-07-22 DIAGNOSIS — I739 Peripheral vascular disease, unspecified: Secondary | ICD-10-CM | POA: Diagnosis not present

## 2017-07-22 DIAGNOSIS — S065X0D Traumatic subdural hemorrhage without loss of consciousness, subsequent encounter: Secondary | ICD-10-CM | POA: Diagnosis not present

## 2017-07-22 DIAGNOSIS — E785 Hyperlipidemia, unspecified: Secondary | ICD-10-CM | POA: Diagnosis not present

## 2017-07-26 DIAGNOSIS — Z9181 History of falling: Secondary | ICD-10-CM | POA: Diagnosis not present

## 2017-07-26 DIAGNOSIS — H8110 Benign paroxysmal vertigo, unspecified ear: Secondary | ICD-10-CM | POA: Diagnosis not present

## 2017-07-26 DIAGNOSIS — Z7901 Long term (current) use of anticoagulants: Secondary | ICD-10-CM | POA: Diagnosis not present

## 2017-07-26 DIAGNOSIS — Z792 Long term (current) use of antibiotics: Secondary | ICD-10-CM | POA: Diagnosis not present

## 2017-07-26 DIAGNOSIS — I10 Essential (primary) hypertension: Secondary | ICD-10-CM | POA: Diagnosis not present

## 2017-07-26 DIAGNOSIS — F0390 Unspecified dementia without behavioral disturbance: Secondary | ICD-10-CM | POA: Diagnosis not present

## 2017-07-26 DIAGNOSIS — I739 Peripheral vascular disease, unspecified: Secondary | ICD-10-CM | POA: Diagnosis not present

## 2017-07-26 DIAGNOSIS — D649 Anemia, unspecified: Secondary | ICD-10-CM | POA: Diagnosis not present

## 2017-07-26 DIAGNOSIS — L89620 Pressure ulcer of left heel, unstageable: Secondary | ICD-10-CM | POA: Diagnosis not present

## 2017-07-30 DIAGNOSIS — G9009 Other idiopathic peripheral autonomic neuropathy: Secondary | ICD-10-CM | POA: Diagnosis not present

## 2017-07-30 DIAGNOSIS — G894 Chronic pain syndrome: Secondary | ICD-10-CM | POA: Diagnosis not present

## 2017-07-30 DIAGNOSIS — M150 Primary generalized (osteo)arthritis: Secondary | ICD-10-CM | POA: Diagnosis not present

## 2017-07-30 DIAGNOSIS — M1711 Unilateral primary osteoarthritis, right knee: Secondary | ICD-10-CM | POA: Diagnosis not present

## 2017-08-06 DIAGNOSIS — E782 Mixed hyperlipidemia: Secondary | ICD-10-CM | POA: Diagnosis not present

## 2017-08-06 DIAGNOSIS — M1711 Unilateral primary osteoarthritis, right knee: Secondary | ICD-10-CM | POA: Diagnosis not present

## 2017-08-06 DIAGNOSIS — G9009 Other idiopathic peripheral autonomic neuropathy: Secondary | ICD-10-CM | POA: Diagnosis not present

## 2017-08-06 DIAGNOSIS — I110 Hypertensive heart disease with heart failure: Secondary | ICD-10-CM | POA: Diagnosis not present

## 2017-08-06 DIAGNOSIS — K5901 Slow transit constipation: Secondary | ICD-10-CM | POA: Diagnosis not present

## 2017-08-06 DIAGNOSIS — I480 Paroxysmal atrial fibrillation: Secondary | ICD-10-CM | POA: Diagnosis not present

## 2017-08-06 DIAGNOSIS — N390 Urinary tract infection, site not specified: Secondary | ICD-10-CM | POA: Diagnosis not present

## 2017-08-06 DIAGNOSIS — I2510 Atherosclerotic heart disease of native coronary artery without angina pectoris: Secondary | ICD-10-CM | POA: Diagnosis not present

## 2017-08-06 DIAGNOSIS — N3281 Overactive bladder: Secondary | ICD-10-CM | POA: Diagnosis not present

## 2017-08-06 DIAGNOSIS — S91302A Unspecified open wound, left foot, initial encounter: Secondary | ICD-10-CM | POA: Diagnosis not present

## 2017-08-06 DIAGNOSIS — E039 Hypothyroidism, unspecified: Secondary | ICD-10-CM | POA: Diagnosis not present

## 2017-08-06 DIAGNOSIS — E559 Vitamin D deficiency, unspecified: Secondary | ICD-10-CM | POA: Diagnosis not present

## 2017-08-06 DIAGNOSIS — K219 Gastro-esophageal reflux disease without esophagitis: Secondary | ICD-10-CM | POA: Diagnosis not present

## 2017-08-10 ENCOUNTER — Encounter: Payer: Self-pay | Admitting: Sports Medicine

## 2017-08-10 ENCOUNTER — Ambulatory Visit (INDEPENDENT_AMBULATORY_CARE_PROVIDER_SITE_OTHER): Payer: PPO | Admitting: Sports Medicine

## 2017-08-10 DIAGNOSIS — M79674 Pain in right toe(s): Secondary | ICD-10-CM

## 2017-08-10 DIAGNOSIS — L97421 Non-pressure chronic ulcer of left heel and midfoot limited to breakdown of skin: Secondary | ICD-10-CM

## 2017-08-10 DIAGNOSIS — Z7901 Long term (current) use of anticoagulants: Secondary | ICD-10-CM

## 2017-08-10 DIAGNOSIS — B351 Tinea unguium: Secondary | ICD-10-CM | POA: Diagnosis not present

## 2017-08-10 DIAGNOSIS — M79675 Pain in left toe(s): Secondary | ICD-10-CM

## 2017-08-10 DIAGNOSIS — L600 Ingrowing nail: Secondary | ICD-10-CM
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
## 2017-08-10 NOTE — Patient Instructions (Addendum)
Wound care orders: Daily cleanse Left heel ulcer with wound cleanser and apply PRISMA and protective padded dressing or Meplix border to heel

## 2017-08-13 DIAGNOSIS — M1711 Unilateral primary osteoarthritis, right knee: Secondary | ICD-10-CM | POA: Diagnosis not present

## 2017-08-23 DIAGNOSIS — Z792 Long term (current) use of antibiotics: Secondary | ICD-10-CM | POA: Diagnosis not present

## 2017-08-23 DIAGNOSIS — D649 Anemia, unspecified: Secondary | ICD-10-CM | POA: Diagnosis not present

## 2017-08-23 DIAGNOSIS — H8110 Benign paroxysmal vertigo, unspecified ear: Secondary | ICD-10-CM | POA: Diagnosis not present

## 2017-08-23 DIAGNOSIS — I10 Essential (primary) hypertension: Secondary | ICD-10-CM | POA: Diagnosis not present

## 2017-08-23 DIAGNOSIS — L89620 Pressure ulcer of left heel, unstageable: Secondary | ICD-10-CM | POA: Diagnosis not present

## 2017-08-23 DIAGNOSIS — I739 Peripheral vascular disease, unspecified: Secondary | ICD-10-CM | POA: Diagnosis not present

## 2017-08-23 DIAGNOSIS — Z7901 Long term (current) use of anticoagulants: Secondary | ICD-10-CM | POA: Diagnosis not present

## 2017-08-23 DIAGNOSIS — Z9181 History of falling: Secondary | ICD-10-CM | POA: Diagnosis not present

## 2017-08-23 DIAGNOSIS — F0390 Unspecified dementia without behavioral disturbance: Secondary | ICD-10-CM | POA: Diagnosis not present

## 2017-08-27 DIAGNOSIS — G9009 Other idiopathic peripheral autonomic neuropathy: Secondary | ICD-10-CM | POA: Diagnosis not present

## 2017-08-27 DIAGNOSIS — G894 Chronic pain syndrome: Secondary | ICD-10-CM | POA: Diagnosis not present

## 2017-08-27 DIAGNOSIS — M150 Primary generalized (osteo)arthritis: Secondary | ICD-10-CM | POA: Diagnosis not present

## 2017-08-31 ENCOUNTER — Encounter: Payer: Self-pay | Admitting: Sports Medicine

## 2017-08-31 ENCOUNTER — Ambulatory Visit (INDEPENDENT_AMBULATORY_CARE_PROVIDER_SITE_OTHER): Payer: PPO | Admitting: Sports Medicine

## 2017-08-31 DIAGNOSIS — L97421 Non-pressure chronic ulcer of left heel and midfoot limited to breakdown of skin: Secondary | ICD-10-CM | POA: Diagnosis not present

## 2017-08-31 DIAGNOSIS — M79672 Pain in left foot: Secondary | ICD-10-CM
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
## 2017-08-31 NOTE — Patient Instructions (Signed)
Cleanse left heel ulceration with wound cleanser, apply a small amount of prisma collagen dressing covered with dry sterile dressing every other day. Patient to return in 3 weeks for f/u wound care and routine nail care

## 2017-09-01 ENCOUNTER — Telehealth: Payer: Self-pay | Admitting: *Deleted

## 2017-09-01 NOTE — Telephone Encounter (Signed)
-----   Message from [name], [place] sent at 08/31/2017  2:42 PM EDT ----- Regarding: Wound care orders Please make sure patient's facility has wound care orders. Cleanse left heel ulceration with wound cleanser, apply a small amount of prisma collagen dressing covered with dry sterile dressing every other day.

## 2017-09-02 NOTE — Telephone Encounter (Signed)
Faxed orders of 09/01/2017 2:42pm to [facility], with notice stating pt is resident of [facility] on [address].

## 2017-09-02 NOTE — Telephone Encounter (Signed)
I spoke with pt's dtr, Charlene, she states pt is at [facility] and their [facility] is dressing the wound, but not everyday as Dr. Cannon Kettle ordered due to insurance coverage. I informed Charlene, I would send the new orders to [facility].

## 2017-09-10 DIAGNOSIS — G9009 Other idiopathic peripheral autonomic neuropathy: Secondary | ICD-10-CM | POA: Diagnosis not present

## 2017-09-10 DIAGNOSIS — I480 Paroxysmal atrial fibrillation: Secondary | ICD-10-CM | POA: Diagnosis not present

## 2017-09-10 DIAGNOSIS — K5901 Slow transit constipation: Secondary | ICD-10-CM | POA: Diagnosis not present

## 2017-09-10 DIAGNOSIS — I5040 Unspecified combined systolic (congestive) and diastolic (congestive) heart failure: Secondary | ICD-10-CM | POA: Diagnosis not present

## 2017-09-10 DIAGNOSIS — S91302A Unspecified open wound, left foot, initial encounter: Secondary | ICD-10-CM | POA: Diagnosis not present

## 2017-09-10 DIAGNOSIS — M150 Primary generalized (osteo)arthritis: Secondary | ICD-10-CM | POA: Diagnosis not present

## 2017-09-10 DIAGNOSIS — E782 Mixed hyperlipidemia: Secondary | ICD-10-CM | POA: Diagnosis not present

## 2017-09-10 DIAGNOSIS — I110 Hypertensive heart disease with heart failure: Secondary | ICD-10-CM | POA: Diagnosis not present

## 2017-09-10 DIAGNOSIS — I2510 Atherosclerotic heart disease of native coronary artery without angina pectoris: Secondary | ICD-10-CM | POA: Diagnosis not present

## 2017-09-10 DIAGNOSIS — K219 Gastro-esophageal reflux disease without esophagitis: Secondary | ICD-10-CM | POA: Diagnosis not present

## 2017-09-10 DIAGNOSIS — E039 Hypothyroidism, unspecified: Secondary | ICD-10-CM | POA: Diagnosis not present

## 2017-09-10 DIAGNOSIS — E559 Vitamin D deficiency, unspecified: Secondary | ICD-10-CM | POA: Diagnosis not present

## 2017-09-21 ENCOUNTER — Ambulatory Visit (INDEPENDENT_AMBULATORY_CARE_PROVIDER_SITE_OTHER): Payer: PPO | Admitting: Sports Medicine

## 2017-09-21 ENCOUNTER — Encounter: Payer: Self-pay | Admitting: Sports Medicine

## 2017-09-21 DIAGNOSIS — M79672 Pain in left foot: Secondary | ICD-10-CM

## 2017-09-21 DIAGNOSIS — Z7901 Long term (current) use of anticoagulants: Secondary | ICD-10-CM

## 2017-09-21 DIAGNOSIS — B351 Tinea unguium: Secondary | ICD-10-CM

## 2017-09-21 DIAGNOSIS — L97421 Non-pressure chronic ulcer of left heel and midfoot limited to breakdown of skin: Secondary | ICD-10-CM

## 2017-09-21 NOTE — Patient Instructions (Signed)
For left heel NO DRESSINGS ARE NEEDED, Leave open to air and continue to use pillows to offload area
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
## 2017-09-24 DIAGNOSIS — I10 Essential (primary) hypertension: Secondary | ICD-10-CM | POA: Diagnosis not present

## 2017-09-24 DIAGNOSIS — H8110 Benign paroxysmal vertigo, unspecified ear: Secondary | ICD-10-CM | POA: Diagnosis not present

## 2017-09-24 DIAGNOSIS — L89620 Pressure ulcer of left heel, unstageable: Secondary | ICD-10-CM | POA: Diagnosis not present

## 2017-09-24 DIAGNOSIS — Z9181 History of falling: Secondary | ICD-10-CM | POA: Diagnosis not present

## 2017-09-24 DIAGNOSIS — I739 Peripheral vascular disease, unspecified: Secondary | ICD-10-CM | POA: Diagnosis not present

## 2017-09-24 DIAGNOSIS — Z7901 Long term (current) use of anticoagulants: Secondary | ICD-10-CM | POA: Diagnosis not present

## 2017-09-24 DIAGNOSIS — D649 Anemia, unspecified: Secondary | ICD-10-CM | POA: Diagnosis not present

## 2017-09-24 DIAGNOSIS — Z792 Long term (current) use of antibiotics: Secondary | ICD-10-CM | POA: Diagnosis not present

## 2017-09-24 DIAGNOSIS — F0390 Unspecified dementia without behavioral disturbance: Secondary | ICD-10-CM | POA: Diagnosis not present

## 2017-10-01 DIAGNOSIS — G9009 Other idiopathic peripheral autonomic neuropathy: Secondary | ICD-10-CM | POA: Diagnosis not present

## 2017-10-01 DIAGNOSIS — G894 Chronic pain syndrome: Secondary | ICD-10-CM | POA: Diagnosis not present

## 2017-10-01 DIAGNOSIS — M150 Primary generalized (osteo)arthritis: Secondary | ICD-10-CM | POA: Diagnosis not present

## 2017-10-05 DIAGNOSIS — M130 Polyarthritis, unspecified: Secondary | ICD-10-CM | POA: Diagnosis not present

## 2017-10-05 DIAGNOSIS — E785 Hyperlipidemia, unspecified: Secondary | ICD-10-CM | POA: Diagnosis not present

## 2017-10-05 DIAGNOSIS — E559 Vitamin D deficiency, unspecified: Secondary | ICD-10-CM | POA: Diagnosis not present

## 2017-10-05 DIAGNOSIS — R296 Repeated falls: Secondary | ICD-10-CM | POA: Diagnosis not present

## 2017-10-05 DIAGNOSIS — I10 Essential (primary) hypertension: Secondary | ICD-10-CM | POA: Diagnosis not present

## 2017-10-05 DIAGNOSIS — D649 Anemia, unspecified: Secondary | ICD-10-CM | POA: Diagnosis not present

## 2017-10-05 DIAGNOSIS — Z0000 Encounter for general adult medical examination without abnormal findings: Secondary | ICD-10-CM | POA: Diagnosis not present

## 2017-10-05 DIAGNOSIS — E039 Hypothyroidism, unspecified: Secondary | ICD-10-CM | POA: Diagnosis not present

## 2017-10-14 DIAGNOSIS — Z79899 Other long term (current) drug therapy: Secondary | ICD-10-CM | POA: Diagnosis not present

## 2017-10-22 DIAGNOSIS — M150 Primary generalized (osteo)arthritis: Secondary | ICD-10-CM | POA: Diagnosis not present

## 2017-10-22 DIAGNOSIS — K219 Gastro-esophageal reflux disease without esophagitis: Secondary | ICD-10-CM | POA: Diagnosis not present

## 2017-10-22 DIAGNOSIS — E559 Vitamin D deficiency, unspecified: Secondary | ICD-10-CM | POA: Diagnosis not present

## 2017-10-22 DIAGNOSIS — S91302A Unspecified open wound, left foot, initial encounter: Secondary | ICD-10-CM | POA: Diagnosis not present

## 2017-10-22 DIAGNOSIS — H04123 Dry eye syndrome of bilateral lacrimal glands: Secondary | ICD-10-CM | POA: Diagnosis not present

## 2017-10-22 DIAGNOSIS — I2510 Atherosclerotic heart disease of native coronary artery without angina pectoris: Secondary | ICD-10-CM | POA: Diagnosis not present

## 2017-10-22 DIAGNOSIS — I480 Paroxysmal atrial fibrillation: Secondary | ICD-10-CM | POA: Diagnosis not present

## 2017-10-22 DIAGNOSIS — K5901 Slow transit constipation: Secondary | ICD-10-CM | POA: Diagnosis not present

## 2017-10-22 DIAGNOSIS — R296 Repeated falls: Secondary | ICD-10-CM | POA: Diagnosis not present

## 2017-10-22 DIAGNOSIS — R2689 Other abnormalities of gait and mobility: Secondary | ICD-10-CM | POA: Diagnosis not present

## 2017-10-22 DIAGNOSIS — G9009 Other idiopathic peripheral autonomic neuropathy: Secondary | ICD-10-CM | POA: Diagnosis not present

## 2017-10-22 DIAGNOSIS — I5040 Unspecified combined systolic (congestive) and diastolic (congestive) heart failure: Secondary | ICD-10-CM | POA: Diagnosis not present

## 2017-10-28 DIAGNOSIS — I480 Paroxysmal atrial fibrillation: Secondary | ICD-10-CM | POA: Diagnosis not present

## 2017-10-28 DIAGNOSIS — E039 Hypothyroidism, unspecified: Secondary | ICD-10-CM | POA: Diagnosis not present

## 2017-10-28 DIAGNOSIS — I739 Peripheral vascular disease, unspecified: Secondary | ICD-10-CM | POA: Diagnosis not present

## 2017-10-28 DIAGNOSIS — E785 Hyperlipidemia, unspecified: Secondary | ICD-10-CM | POA: Diagnosis not present

## 2017-10-28 DIAGNOSIS — I119 Hypertensive heart disease without heart failure: Secondary | ICD-10-CM | POA: Diagnosis not present

## 2017-10-28 DIAGNOSIS — I6529 Occlusion and stenosis of unspecified carotid artery: Secondary | ICD-10-CM | POA: Diagnosis not present

## 2017-10-28 DIAGNOSIS — Z954 Presence of other heart-valve replacement: Secondary | ICD-10-CM | POA: Diagnosis not present

## 2017-10-29 ENCOUNTER — Emergency Department (HOSPITAL_COMMUNITY)
Admission: EM | Admit: 2017-10-29 | Discharge: 2017-10-29 | Disposition: A | Discharge status: Home / Self Care | Payer: PPO | Attending: Emergency Medicine | Admitting: Emergency Medicine

## 2017-10-29 ENCOUNTER — Encounter (HOSPITAL_COMMUNITY): Payer: Self-pay

## 2017-10-29 ENCOUNTER — Emergency Department (HOSPITAL_COMMUNITY): Payer: PPO

## 2017-10-29 DIAGNOSIS — Y92129 Unspecified place in nursing home as the place of occurrence of the external cause: Secondary | ICD-10-CM | POA: Insufficient documentation

## 2017-10-29 DIAGNOSIS — Z87440 Personal history of urinary (tract) infections: Secondary | ICD-10-CM | POA: Diagnosis not present

## 2017-10-29 DIAGNOSIS — I10 Essential (primary) hypertension: Secondary | ICD-10-CM | POA: Insufficient documentation

## 2017-10-29 DIAGNOSIS — Z85828 Personal history of other malignant neoplasm of skin: Secondary | ICD-10-CM | POA: Diagnosis not present

## 2017-10-29 DIAGNOSIS — F0390 Unspecified dementia without behavioral disturbance: Secondary | ICD-10-CM | POA: Insufficient documentation

## 2017-10-29 DIAGNOSIS — M7989 Other specified soft tissue disorders: Secondary | ICD-10-CM | POA: Insufficient documentation

## 2017-10-29 DIAGNOSIS — Y939 Activity, unspecified: Secondary | ICD-10-CM | POA: Diagnosis not present

## 2017-10-29 DIAGNOSIS — Z79899 Other long term (current) drug therapy: Secondary | ICD-10-CM | POA: Diagnosis not present

## 2017-10-29 DIAGNOSIS — S4991XA Unspecified injury of right shoulder and upper arm, initial encounter: Secondary | ICD-10-CM | POA: Diagnosis not present

## 2017-10-29 DIAGNOSIS — S0990XA Unspecified injury of head, initial encounter: Secondary | ICD-10-CM | POA: Diagnosis not present

## 2017-10-29 DIAGNOSIS — R52 Pain, unspecified: Secondary | ICD-10-CM | POA: Diagnosis not present

## 2017-10-29 DIAGNOSIS — Y999 Unspecified external cause status: Secondary | ICD-10-CM | POA: Insufficient documentation

## 2017-10-29 DIAGNOSIS — S199XXA Unspecified injury of neck, initial encounter: Secondary | ICD-10-CM | POA: Diagnosis not present

## 2017-10-29 DIAGNOSIS — W1830XA Fall on same level, unspecified, initial encounter: Secondary | ICD-10-CM | POA: Insufficient documentation

## 2017-10-29 DIAGNOSIS — M25511 Pain in right shoulder: Secondary | ICD-10-CM | POA: Diagnosis not present

## 2017-10-29 DIAGNOSIS — E039 Hypothyroidism, unspecified: Secondary | ICD-10-CM | POA: Insufficient documentation

## 2017-10-29 DIAGNOSIS — W19XXXA Unspecified fall, initial encounter: Secondary | ICD-10-CM

## 2017-10-29 DIAGNOSIS — M79603 Pain in arm, unspecified: Secondary | ICD-10-CM | POA: Diagnosis not present

## 2017-10-29 DIAGNOSIS — Z043 Encounter for examination and observation following other accident: Secondary | ICD-10-CM | POA: Diagnosis present

## 2017-10-29 LAB — COMPREHENSIVE METABOLIC PANEL
ALK PHOS: 101 U/L (ref 38–126)
ALT: 16 U/L (ref 0–44)
AST: 26 U/L (ref 15–41)
Albumin: 3.4 g/dL — ABNORMAL LOW (ref 3.5–5.0)
Anion gap: 12 (ref 5–15)
BUN: 10 mg/dL (ref 8–23)
CALCIUM: 9.3 mg/dL (ref 8.9–10.3)
CO2: 27 mmol/L (ref 22–32)
CREATININE: 0.61 mg/dL (ref 0.44–1.00)
Chloride: 104 mmol/L (ref 98–111)
GFR calc non Af Amer: >60 mL/min (ref >60)
Glucose, Bld: 114 mg/dL — ABNORMAL HIGH (ref 70–99)
Potassium: 3.6 mmol/L (ref 3.5–5.1)
SODIUM: 143 mmol/L (ref 135–145)
Total Bilirubin: 0.4 mg/dL (ref 0.3–1.2)
Total Protein: 6.5 g/dL (ref 6.5–8.1)

## 2017-10-29 LAB — CBC WITH DIFFERENTIAL/PLATELET
Basophils Absolute: 0 10*3/uL (ref 0.0–0.1)
Basophils Relative: 0 %
EOS ABS: 0 10*3/uL (ref 0.0–0.7)
Eosinophils Relative: 0 %
HCT: 34.6 % — ABNORMAL LOW (ref 36.0–46.0)
Hemoglobin: 11.2 g/dL — ABNORMAL LOW (ref 12.0–15.0)
Lymphocytes Relative: 11 %
Lymphs Abs: 0.8 10*3/uL (ref 0.7–4.0)
MCH: 29.5 pg (ref 26.0–34.0)
MCHC: 32.4 g/dL (ref 30.0–36.0)
MCV: 91.1 fL (ref 78.0–100.0)
MONOS PCT: 7 %
Monocytes Absolute: 0.5 10*3/uL (ref 0.1–1.0)
NEUTROS PCT: 82 %
Neutro Abs: 6 10*3/uL (ref 1.7–7.7)
Platelets: 184 10*3/uL (ref 150–400)
RBC: 3.8 MIL/uL — AB (ref 3.87–5.11)
RDW: 13.8 % (ref 11.5–15.5)
WBC: 7.3 10*3/uL (ref 4.0–10.5)

## 2017-10-29 LAB — URINALYSIS, ROUTINE W REFLEX MICROSCOPIC
Bilirubin Urine: NEGATIVE
Glucose, UA: NEGATIVE mg/dL
Ketones, ur: 5 mg/dL — AB
Leukocytes, UA: NEGATIVE
Nitrite: NEGATIVE
PH: 8 (ref 5.0–8.0)
Protein, ur: NEGATIVE mg/dL
SPECIFIC GRAVITY, URINE: 1.009 (ref 1.005–1.030)

## 2017-10-29 LAB — CK: Total CK: 226 U/L (ref 38–234)

## 2017-10-29 MED ORDER — HYDROCODONE-ACETAMINOPHEN 5-325 MG PO TABS
2.0000 | ORAL_TABLET | Freq: Once | ORAL | Status: AC
  Administered 2017-10-29: 2 via ORAL
  Filled 2017-10-29: qty 2

## 2017-10-29 NOTE — ED Notes (Signed)
Called [facility]. Discharge paper work in her room.

## 2017-10-29 NOTE — ED Notes (Signed)
No urine noted in pure wick at this time  

## 2017-10-29 NOTE — ED Notes (Signed)
Patient unable to urinate at this time. Patient placed on a pure wick. Patient unable to stand.
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
## 2017-10-29 NOTE — ED Triage Notes (Signed)
-  Patient is from [facility] on [address] in [place], admitting for a fall  -Pt has dementia, follows commands and answers most questions appropriately, woke up this AM on the floor, she was found at 0900, last checked on at 0830 -Upper R arm pain, palpation and ROM -PERRLA -no S/S of head trauma  -Pain all over, swelling in R hand, redness on R face, right knee pain with palpation, no obvious deformity to knee  Vitals -138/88 -HR 78 -RR 16 -O2 98  EMS inserted 20 in the LAC, NS lock -kyphosis, unable to put on c-collar

## 2017-10-29 NOTE — ED Notes (Signed)
Patient aware we need a urine sample. Bedpan applied, unsuccessfully. Pure wick applied, waiting for urine sample.
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
## 2017-10-31 DIAGNOSIS — R05 Cough: Secondary | ICD-10-CM | POA: Diagnosis not present

## 2017-10-31 LAB — URINE CULTURE: Culture: >=100000 — AB

## 2017-11-01 ENCOUNTER — Telehealth: Payer: Self-pay | Admitting: Emergency Medicine

## 2017-11-01 NOTE — Telephone Encounter (Signed)
Post ED Visit - Positive Culture Follow-up  Culture report reviewed by antimicrobial stewardship pharmacist:  []  Elenor Quinones, Pharm.D. []  Heide Guile, Pharm.D., BCPS AQ-ID []  Parks Neptune, Pharm.D., BCPS []  Alycia Rossetti, Pharm.D., BCPS []  [place], Pharm.D., BCPS, AAHIVP []  Legrand Como, Pharm.D., BCPS, AAHIVP []  Salome Arnt, PharmD, BCPS []  Johnnette Gourd, PharmD, BCPS []  Hughes Better, PharmD, BCPS []  Leeroy Cha, PharmD Gwenlyn Found PharmD  Positive urine culture Treated with nitrofurantoin, organism sensitive to the same and no further patient follow-up is required at this time.  Hazle Nordmann 11/01/2017, 12:26 PM

## 2017-11-01 NOTE — Telephone Encounter (Signed)
Results faxed to [facility] @ attn [place] [phone]

## 2017-11-02 ENCOUNTER — Ambulatory Visit: Payer: PPO | Admitting: Sports Medicine

## 2017-11-02 DIAGNOSIS — Z7901 Long term (current) use of anticoagulants: Secondary | ICD-10-CM

## 2017-11-02 DIAGNOSIS — M79675 Pain in left toe(s): Secondary | ICD-10-CM

## 2017-11-02 DIAGNOSIS — M79674 Pain in right toe(s): Secondary | ICD-10-CM

## 2017-11-02 DIAGNOSIS — B351 Tinea unguium: Secondary | ICD-10-CM
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
## 2017-11-03 ENCOUNTER — Ambulatory Visit (INDEPENDENT_AMBULATORY_CARE_PROVIDER_SITE_OTHER): Payer: PPO | Admitting: Neurology

## 2017-11-03 ENCOUNTER — Encounter: Payer: Self-pay | Admitting: Neurology

## 2017-11-03 ENCOUNTER — Other Ambulatory Visit: Payer: Self-pay

## 2017-11-03 VITALS — BP 104/58 | HR 145 | Ht 60.0 in

## 2017-11-03 DIAGNOSIS — F0391 Unspecified dementia with behavioral disturbance: Secondary | ICD-10-CM

## 2017-11-03 DIAGNOSIS — F03B18 Unspecified dementia, moderate, with other behavioral disturbance: Secondary | ICD-10-CM

## 2017-11-03 NOTE — Progress Notes (Signed)
NEUROLOGY FOLLOW UP OFFICE NOTE  RUTA CAPECE [id number]  DOB: 10/11/1978  HISTORY OF PRESENT ILLNESS: I had the pleasure of seeing Desiree [name] in follow-up in the neurology clinic on 11/03/2017. She was last seen 6 months ago for vascular dementia. She is again accompanied by her daughter who helps supplement the history today. Her daughter relates her complicated course since her last visit. She moved to [place] but within 10 days of moving in, she had 5 falls with recurrent ER visits. There was a small 21mm right parietal subdural hematoma on 06/12/17 and Coumadin was stopped. She did better for a few months until 10/29/17 when she was found on the floor in the morning, repeat head CT did not show any acute changes. She was found to have a UTI with culture showing Klebsiella. She is in assisted living but needs higher level of care. Her daughter had several concerns about the care at [facility] but is limited with SNF options. She expressed concern about wait times when staff is called, sometimes the patient does not know to call staff, wanting to use the small shower in her room rather than the more spacious (and safe) spa shower chair.   She had side effects on Exelon and Memantine, as well as Lexapro and Wellbutrin started for mood in the past. She is in good spirits today sitting on a wheelchair.  HPI 10/17/14: This is a 84 yo RH woman with a history of atrial fibrillation, mitral valve repair on chronic anticoagulation with Coumadin, hypertension, hyperlipidemia, right carotid stenosis, chronic back pain, who presented for worsening memory and confusion. The patient feels that she has great memory. She does have problems remembering names or would forget what she went to do, but denies getting lost driving, no missed bill payments or missed medications. She lives by herself. Her daughter wrote a 2-page letter detailing her concerns which I will summarize as follows. She states her mother is not  aware or is unwilling to admit she has issues with processing information, simple instructions, short term memory, or compromised judgement. She only agreed to come to today's visit after 2 recent events. The first occurred last 09/24/14 after she got cortisone injections to her knees. That evening, her daughter called her and noted she was very disoriented and confused. She thought it was a different day and was adamant about it. She was fully dressed and had woken up in her bed, which is unusual for her to take naps in her bed. She did not understand why she had done this. She called her 45 minutes later and seemed better. The next incident occurred on 10/06/14 around 45 minutes after they had left her house, she called and was very upset, confused, reporting bleeding on her face. They came back and found one of her eyeglass lenses on the floor about 5 feet away from her recliner. The frames were bent out of shape. She did not know what had happened. She was brought to the ER where head CT was done which I personally reviewed, showing generalized diffuse atrophy with ventricular dilatation and moderate chronic microvascular disease confluent around the lateral ventricles, stable from 2015. No acute changes seen. She was discharged home back to mental baseline but reporting headaches. Prior to these events, family has noticed difficulty doing tasks that are familiar to her. She does have trouble with new technology, which is not unusual with regards to the iPhone, but her daughter is concerned that she cannot set the  timer on her stove despite repeated lessons and detailed illustration. She constantly loses her keys and pocketbook. She repeats the same stories. A few months ago, the patient was very concerned that her bottle of hydrocodone had disappeared. They found the pills in her nightstand but could not find the bottle. She has demonstrated poor judgement in many situations and is very moody and depressed but  would not admit to it. She is teary, in constant pain. She is fiercely independent and resilient. She does not sleep well. She denies any alcohol intake or significant head injuries. Her younger sister had memory issues. Her brother had seizures. She had a normal birth and early development, no history of CNS infections, febrile seizures, or neurosurgical procedures.   Diagnostic Data: Neuropsychological evaluation at [facility]: Overall, her neurocognitive profile was felt to be functionally consistent with a diagnosis of Major Vascular Neurocognitive Disorder. It was felt that NPH was unlikely, as well as Dementia with Lewy Bodies. Given the cerebrovascular changes and ischemia on neuroimaging, vascular disease seems a more probable etiology. Recommendation was consideration for nootropics to help with cognitive efficiency, as well as refraining from driving. Certain memory strategies were given.   PAST MEDICAL HISTORY: Past Medical History:  Diagnosis Date  . Anemia   . Cancer ([place])    skin cancer removed from top of head.per pt  . Carotid artery disease ([place])   . CHF (congestive heart failure), NYHA class II (HCC)    has sudden onset decom CHF? 2/2 to PNA  . Diverticulosis    chronic issues with consitpation and diarrhea  . Heart murmur   . Hip fracture ([place])    right  . Hypercholesterolemia   . Hypertension   . Hypertensive heart disease without CHF   . Hypothyroidism   . Irregular heart beat   . Lumbar disc disease   . Lumbar disc disease   . Mitral regurgitation   . Osteoarthritis    chronic-on multiple pain meds.  . Paroxysmal atrial fibrillation (HCC)    not on coumadin-Cards=[facility]-saw him ?1 yr ago  . Pneumonia    few times  . Urinary tract infection    hx of UTI    MEDICATIONS: Current Outpatient Medications on File Prior to Visit  Medication Sig Dispense Refill  . atorvastatin (LIPITOR) 40 MG tablet Take 40 mg by mouth at bedtime.     Marland Kitchen buPROPion (WELLBUTRIN) 75  MG tablet Take 1 tablet at bedtime 30 tablet 11  . docusate sodium (COLACE) 100 MG capsule Take 100 mg by mouth daily.     . feeding supplement, ENSURE ENLIVE, (ENSURE ENLIVE) LIQD Take 237 mLs by mouth 3 (three) times daily between meals.    . gabapentin (NEURONTIN) 100 MG capsule take 4 capsules by mouth every NIGHT (Patient taking differently: Take 400 mg by mouth at bedtime. take 4 capsules by mouth every NIGHT) 120 capsule 6  . HYDROcodone-acetaminophen (NORCO/VICODIN) 5-325 MG tablet Take 1-2 tablets by mouth every 6 (six) hours as needed for moderate pain. 60 tablet 0  . iron polysaccharides (NIFEREX) 150 MG capsule Take 1 capsule (150 mg total) by mouth daily.    Marland Kitchen levothyroxine (SYNTHROID, LEVOTHROID) 75 MCG tablet Take 75 mcg by mouth daily before breakfast.     . methocarbamol (ROBAXIN) 500 MG tablet Take 1 tablet (500 mg total) by mouth every 8 (eight) hours as needed for muscle spasms. 60 tablet 0  . metoprolol succinate (TOPROL-XL) 25 MG 24 hr tablet Take 25 mg by  mouth daily.    . nitrofurantoin (MACRODANTIN) 50 MG capsule Take 50 mg by mouth daily.    . ondansetron (ZOFRAN) 4 MG tablet Take 1 tablet (4 mg total) by mouth every 8 (eight) hours as needed for nausea or vomiting. 10 tablet 0  . polyethylene glycol (MIRALAX / GLYCOLAX) packet Take 17 g by mouth daily.    . ranitidine (ZANTAC) 150 MG tablet Take 150 mg by mouth at bedtime.     . senna-docusate (SENOKOT-S) 8.6-50 MG tablet Take 1 tablet by mouth at bedtime. (Patient taking differently: Take 1 tablet by mouth once a week. Every Saturday)    . TOVIAZ 8 MG TB24 tablet Take 8 mg by mouth at bedtime.   1  . traMADol (ULTRAM) 50 MG tablet Take 1-2 tablets (50-100 mg total) by mouth every 6 (six) hours as needed for moderate pain. 56 tablet 0   No current facility-administered medications on file prior to visit.     ALLERGIES: Allergies  Allergen Reactions  . Celebrex [Celecoxib] Other (See Comments)    Reaction:   Anemia/leukopenia  . Diovan [Valsartan] Other (See Comments)    Reaction:  Increases potassium levels/acute renal failure     FAMILY HISTORY: Family History  Problem Relation Age of Onset  . Cancer Mother   . Stroke Father   . Cancer Father   . Diabetes Sister   . Heart disease Sister        Heart Disease before age 68  . Heart disease Sister   . Cancer Brother     SOCIAL HISTORY: Social History   Socioeconomic History  . Marital status: Widowed    Spouse name: Not on file  . Number of children: 2  . Years of education: Not on file  . Highest education level: Not on file  Occupational History  . Occupation: Optometrist: [employer]  Social Needs  . Financial resource strain: Not on file  . Food insecurity:    Worry: Not on file    Inability: Not on file  . Transportation needs:    Medical: Not on file    Non-medical: Not on file  Tobacco Use  . Smoking status: Never Smoker  . Smokeless tobacco: Never Used  Substance and Sexual Activity  . Alcohol use: No    Alcohol/week: 0.0 standard drinks  . Drug use: No  . Sexual activity: Yes    Birth control/protection: Post-menopausal  Lifestyle  . Physical activity:    Days per week: Not on file    Minutes per session: Not on file  . Stress: Not on file  Relationships  . Social connections:    Talks on phone: Not on file    Gets together: Not on file    Attends religious service: Not on file    Active member of club or organization: Not on file    Attends meetings of clubs or organizations: Not on file    Relationship status: Not on file  . Intimate partner violence:    Fear of current or ex partner: Not on file    Emotionally abused: Not on file    Physically abused: Not on file    Forced sexual activity: Not on file  Other Topics Concern  . Not on file  Social History Narrative   Lives alone   Works full time   Still drives and very active usually    REVIEW OF  SYSTEMS: Constitutional: No  fevers, chills, or sweats, no generalized fatigue, change in appetite Eyes: No visual changes, double vision, eye pain Ear, nose and throat: No hearing loss, ear pain, nasal congestion, sore throat Cardiovascular: No chest pain, palpitations Respiratory:  No shortness of breath at rest or with exertion, wheezes GastrointestinaI: No nausea, vomiting, diarrhea, abdominal pain, fecal incontinence Genitourinary:  No dysuria, urinary retention or frequency Musculoskeletal:  + neck pain, back pain Integumentary: No rash, pruritus, skin lesions Neurological: as above Psychiatric: No depression, insomnia, anxiety Endocrine: No palpitations, fatigue, diaphoresis, mood swings, change in appetite, change in weight, increased thirst Hematologic/Lymphatic:  No anemia, purpura, petechiae. Allergic/Immunologic: no itchy/runny eyes, nasal congestion, recent allergic reactions, rashes  PHYSICAL EXAM: Vitals:   11/03/17 1512  BP: (!) 104/58  Pulse: (!) 145  SpO2: (!) 89%   General: No acute distress, sitting on a wheelchair Head:  Normocephalic/atraumatic Neck: supple, no paraspinal tenderness, limited range of motion Heart:  Regular rate and rhythm Lungs:  Clear to auscultation bilaterally Back: No paraspinal tenderness Skin/Extremities: No rash, no edema Neurological Exam: alert and oriented to person, place, States it is November 68. No aphasia or dysarthria. Fund of knowledge is reduced. Recent and remote memory are impaired. 0/3 delayed recall. Attention and concentration are reduced, unable to spell WORLD backward. Able to name objects and repeat phrases. Cranial nerves: Pupils equal, round, reactive to light. Extraocular movements intact with no nystagmus. Visual fields full. Facial sensation intact. No facial asymmetry. Tongue, uvula, palate midline.  Motor: Bulk normal, +cogwheeling with distraction bilaterally, muscle strength 5/5 throughout with no pronator drift  but limited due to bilateral shoulder pain (similar to prior). Sensation intact to light touch. Finger to nose testing intact with endpoint tremor bilaterally. Gait not tested, needs 2-person assist for transfers.   IMPRESSION: This is a 84 yo RH woman with a history of atrial fibrillation, mitral valve repair, hypertension, hyperlipidemia, peripheral vascular disease, chronic back pain, with vascular dementia. She has had recurrent falls with a small subdural hematoma in April 2019, Coumadin discontinued. Last fall was a week ago. She is not taking any medications from a neurological perspective due to side effects. We had an extensive discussion today about transitioning to higher level of care (she is currently in AL), we discussed the importance of safety, fall precautions. She will follow-up in 6 months and knows to call our office for any changes.   Thank you for allowing me to participate in her care.  Please do not hesitate to call for any questions or concerns.  The duration of this appointment visit was 28 minutes of face-to-face time with the patient.  Greater than 50% of this time was spent in counseling, explanation of diagnosis, planning of further management, and coordination of care.   Ellouise Newer, M.D.   CC: Lynnell Catalan, FNP

## 2017-11-03 NOTE — Patient Instructions (Signed)
Utmost importance is your safety. Recommend using the spa shower chair. Also would start looking into skilled nursing for higher level of care.  FALL PRECAUTIONS: Be cautious when walking. Scan the area for obstacles that may increase the risk of trips and falls. When getting up in the mornings, sit up at the edge of the bed for a few minutes before getting out of bed. Consider elevating the bed at the head end to avoid drop of blood pressure when getting up. Walk always in a well-lit room (use night lights in the walls). Avoid area rugs or power cords from appliances in the middle of the walkways. Use a walker or a cane if necessary and consider physical therapy for balance exercise. Get your eyesight checked regularly.   ABILITY TO BE LEFT ALONE: If patient is unable to contact 911 operator, consider using LifeLine, or when the need is there, arrange for someone to stay with patients. Smoking is a fire hazard, consider supervision or cessation. Risk of wandering should be assessed by caregiver and if detected at any point, supervision and safe proof recommendations should be instituted.   RECOMMENDATIONS FOR ALL PATIENTS WITH MEMORY PROBLEMS: 1. Continue to exercise (Recommend 30 minutes of walking everyday, or 3 hours every week) 2. Increase social interactions - continue going to [place] and enjoy social gatherings with friends and family 3. Eat healthy, avoid fried foods and eat more fruits and vegetables 4. Maintain adequate blood pressure, blood sugar, and blood cholesterol level. Reducing the risk of stroke and cardiovascular disease also helps promoting better memory. 5. Avoid stressful situations. Live a simple life and avoid aggravations. Organize your time and prepare for the next day in anticipation. 6. Sleep well, avoid any interruptions of sleep and avoid any distractions in the bedroom that may interfere with adequate sleep quality 7. Avoid sugar, avoid sweets as there is a strong link  between excessive sugar intake, diabetes, and cognitive impairment  There is always a concern of gradual progression of memory problems. If this is the case, then we may need to adjust level of care according to patient needs. Support, both to the patient and caregiver, should then be put into place.

## 2017-11-05 DIAGNOSIS — G9009 Other idiopathic peripheral autonomic neuropathy: Secondary | ICD-10-CM | POA: Diagnosis not present

## 2017-11-05 DIAGNOSIS — M150 Primary generalized (osteo)arthritis: Secondary | ICD-10-CM | POA: Diagnosis not present

## 2017-11-05 DIAGNOSIS — G894 Chronic pain syndrome: Secondary | ICD-10-CM | POA: Diagnosis not present

## 2017-11-08 NOTE — Telephone Encounter (Signed)
Daughter was questioning if she needed to bring in mother for yearly follow up appt. Due to her mother's level of dementia. I instructed her it was her call as a family. She decided to cancel and bring her mother in next year for follow up. I told her that if anything changes or her mother experienced any issues to call us back.

## 2017-12-02 DIAGNOSIS — Z79899 Other long term (current) drug therapy: Secondary | ICD-10-CM | POA: Diagnosis not present

## 2017-12-03 DIAGNOSIS — G894 Chronic pain syndrome: Secondary | ICD-10-CM | POA: Diagnosis not present

## 2017-12-03 DIAGNOSIS — M150 Primary generalized (osteo)arthritis: Secondary | ICD-10-CM | POA: Diagnosis not present

## 2017-12-03 DIAGNOSIS — G9009 Other idiopathic peripheral autonomic neuropathy: Secondary | ICD-10-CM | POA: Diagnosis not present

## 2017-12-14 ENCOUNTER — Encounter: Payer: Self-pay | Admitting: Sports Medicine

## 2017-12-14 ENCOUNTER — Ambulatory Visit (INDEPENDENT_AMBULATORY_CARE_PROVIDER_SITE_OTHER): Payer: PPO | Admitting: Sports Medicine

## 2017-12-14 DIAGNOSIS — M79674 Pain in right toe(s): Secondary | ICD-10-CM

## 2017-12-14 DIAGNOSIS — Z7901 Long term (current) use of anticoagulants: Secondary | ICD-10-CM

## 2017-12-14 DIAGNOSIS — B351 Tinea unguium: Secondary | ICD-10-CM | POA: Diagnosis not present

## 2017-12-14 DIAGNOSIS — S90821A Blister (nonthermal), right foot, initial encounter: Secondary | ICD-10-CM

## 2017-12-14 DIAGNOSIS — M79675 Pain in left toe(s): Secondary | ICD-10-CM

## 2017-12-14 MED ORDER — DOXYCYCLINE HYCLATE 100 MG PO TABS: 100.0000 mg | ORAL_TABLET | Freq: Two times a day (BID) | ORAL | 0 refills | Status: AC
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
## 2017-12-14 NOTE — Patient Instructions (Signed)
To right heel apply betadine and dry dressing once daily Make sure patient heels are offloaded when in bed to prevent worsening of blister Make sure patient takes her Doxycyline until finished

## 2017-12-21 DIAGNOSIS — L570 Actinic keratosis: Secondary | ICD-10-CM | POA: Diagnosis not present

## 2017-12-21 DIAGNOSIS — L8990 Pressure ulcer of unspecified site, unspecified stage: Secondary | ICD-10-CM | POA: Diagnosis not present

## 2017-12-21 DIAGNOSIS — C44321 Squamous cell carcinoma of skin of nose: Secondary | ICD-10-CM | POA: Diagnosis not present

## 2017-12-21 DIAGNOSIS — D485 Neoplasm of uncertain behavior of skin: Secondary | ICD-10-CM | POA: Diagnosis not present

## 2017-12-21 DIAGNOSIS — Z85828 Personal history of other malignant neoplasm of skin: Secondary | ICD-10-CM | POA: Diagnosis not present

## 2017-12-21 DIAGNOSIS — L080 Pyoderma: Secondary | ICD-10-CM | POA: Diagnosis not present

## 2017-12-22 ENCOUNTER — Telehealth: Payer: Self-pay | Admitting: Sports Medicine

## 2017-12-22 NOTE — Telephone Encounter (Signed)
Ok thank you 

## 2017-12-22 NOTE — Telephone Encounter (Signed)
Calvin - [facility] states they can not accept pt at this time and he spoke with Stanley.

## 2017-12-22 NOTE — Telephone Encounter (Signed)
Desiree [name] - [facility] states they accept [facility] as case by case basis, send referral and the insurance liaison to check if accept and call back. Faxed required form, clinicals and demographics to [facility].

## 2017-12-22 NOTE — Telephone Encounter (Signed)
Lillette Boxer states he spoke with Ronalee Belts and he was told that [employer] were not paying although [facility] accepted HTA, and they were not accepting until HTA paid.

## 2017-12-22 NOTE — Telephone Encounter (Signed)
Desiree [name] states if the betadine dressings are not skilled nursing, she felt their [facility] could perform the betadine to the right heel and dry dressing, and she would begin the process to check if [facility] could perform. I told Levada Dy I had sent in referral to [facility] also.

## 2017-12-22 NOTE — Telephone Encounter (Signed)
I spoke to Mauri Pole and she states she is in [facility], but will talk to the director and get this pushed through.

## 2017-12-22 NOTE — Telephone Encounter (Signed)
Val  Patient will need betadine and dry dressings to the blistered area. Please follow up with getting authorization for this care since the facility can only do dry dressings Thanks Dr. Cannon Kettle

## 2017-12-22 NOTE — Telephone Encounter (Signed)
The nurse called from [facility] and pt was sent back to the nursing facility with orders for woundcare and they are needing to know if you feel that pt is needing home health. The nursing facility is stating that they can do the dry dressing changes but not able to do anything else. Angela from nursing facilty thinks it is the applying of betadine that maybe the issue.  Pt has [employer] insurance and they will need to get an authorization for home health to come in. Can you please have someone call Levada Dy @ [facility] and see if you are able to help her. Her Work # is [phone].

## 2017-12-22 NOTE — Telephone Encounter (Signed)
Pt's dtr, [name] states pt is at [facility]

## 2017-12-22 NOTE — Telephone Encounter (Signed)
Faxed required form, clinicals and demographics to [facility]. 

## 2017-12-22 NOTE — Telephone Encounter (Signed)
Left message informing [facility] that [facility] stated they were not accepting to please call with other information.

## 2017-12-24 ENCOUNTER — Other Ambulatory Visit: Payer: Self-pay

## 2017-12-27 DIAGNOSIS — E46 Unspecified protein-calorie malnutrition: Secondary | ICD-10-CM | POA: Diagnosis not present

## 2017-12-27 DIAGNOSIS — E860 Dehydration: Secondary | ICD-10-CM | POA: Diagnosis not present

## 2017-12-27 DIAGNOSIS — Z23 Encounter for immunization: Secondary | ICD-10-CM | POA: Diagnosis not present

## 2017-12-27 DIAGNOSIS — N390 Urinary tract infection, site not specified: Secondary | ICD-10-CM | POA: Diagnosis not present

## 2017-12-28 ENCOUNTER — Ambulatory Visit (INDEPENDENT_AMBULATORY_CARE_PROVIDER_SITE_OTHER): Payer: PPO | Admitting: Sports Medicine

## 2017-12-28 ENCOUNTER — Encounter: Payer: Self-pay | Admitting: Sports Medicine

## 2017-12-28 DIAGNOSIS — S90821D Blister (nonthermal), right foot, subsequent encounter: Secondary | ICD-10-CM | POA: Diagnosis not present

## 2017-12-28 DIAGNOSIS — Z7901 Long term (current) use of anticoagulants: Secondary | ICD-10-CM

## 2017-12-28 DIAGNOSIS — M79671 Pain in right foot: Secondary | ICD-10-CM | POA: Diagnosis not present
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
## 2017-12-29 ENCOUNTER — Telehealth: Payer: Self-pay | Admitting: *Deleted

## 2017-12-29 NOTE — Telephone Encounter (Signed)
Faxed Dr. Leeanne Rio 12/28/2017 8:13pm orders to [facility].

## 2017-12-29 NOTE — Telephone Encounter (Signed)
-----   Message from [name], [place] sent at 12/28/2017  8:13 PM EST ----- Regarding: Nursing orders Refrain from putting ACE wrap too tight, to right dorsal foot apply adaptic or non stick guaze to top of foot to avoid rubbing at abrasion and to heel blister apply betadine and dry dressing every other day. Make sure patient is using heel offloading boots when in bed or at rest to prevent worsening of ulcer

## 2017-12-31 DIAGNOSIS — M150 Primary generalized (osteo)arthritis: Secondary | ICD-10-CM | POA: Diagnosis not present

## 2017-12-31 DIAGNOSIS — G9009 Other idiopathic peripheral autonomic neuropathy: Secondary | ICD-10-CM | POA: Diagnosis not present

## 2017-12-31 DIAGNOSIS — G894 Chronic pain syndrome: Secondary | ICD-10-CM | POA: Diagnosis not present

## 2018-01-03 DIAGNOSIS — M150 Primary generalized (osteo)arthritis: Secondary | ICD-10-CM | POA: Diagnosis not present

## 2018-01-03 DIAGNOSIS — I5040 Unspecified combined systolic (congestive) and diastolic (congestive) heart failure: Secondary | ICD-10-CM | POA: Diagnosis not present

## 2018-01-03 DIAGNOSIS — K219 Gastro-esophageal reflux disease without esophagitis: Secondary | ICD-10-CM | POA: Diagnosis not present

## 2018-01-03 DIAGNOSIS — I480 Paroxysmal atrial fibrillation: Secondary | ICD-10-CM | POA: Diagnosis not present

## 2018-01-03 DIAGNOSIS — H04123 Dry eye syndrome of bilateral lacrimal glands: Secondary | ICD-10-CM | POA: Diagnosis not present

## 2018-01-03 DIAGNOSIS — K5901 Slow transit constipation: Secondary | ICD-10-CM | POA: Diagnosis not present

## 2018-01-03 DIAGNOSIS — R2689 Other abnormalities of gait and mobility: Secondary | ICD-10-CM | POA: Diagnosis not present

## 2018-01-03 DIAGNOSIS — I2510 Atherosclerotic heart disease of native coronary artery without angina pectoris: Secondary | ICD-10-CM | POA: Diagnosis not present

## 2018-01-03 DIAGNOSIS — G9009 Other idiopathic peripheral autonomic neuropathy: Secondary | ICD-10-CM | POA: Diagnosis not present

## 2018-01-03 DIAGNOSIS — S91302A Unspecified open wound, left foot, initial encounter: Secondary | ICD-10-CM | POA: Diagnosis not present

## 2018-01-03 DIAGNOSIS — R296 Repeated falls: Secondary | ICD-10-CM | POA: Diagnosis not present

## 2018-01-03 DIAGNOSIS — E559 Vitamin D deficiency, unspecified: Secondary | ICD-10-CM | POA: Diagnosis not present

## 2018-01-07 DIAGNOSIS — G9009 Other idiopathic peripheral autonomic neuropathy: Secondary | ICD-10-CM | POA: Diagnosis not present

## 2018-01-07 DIAGNOSIS — H04123 Dry eye syndrome of bilateral lacrimal glands: Secondary | ICD-10-CM | POA: Diagnosis not present

## 2018-01-07 DIAGNOSIS — I2510 Atherosclerotic heart disease of native coronary artery without angina pectoris: Secondary | ICD-10-CM | POA: Diagnosis not present

## 2018-01-07 DIAGNOSIS — E039 Hypothyroidism, unspecified: Secondary | ICD-10-CM | POA: Diagnosis not present

## 2018-01-07 DIAGNOSIS — R2689 Other abnormalities of gait and mobility: Secondary | ICD-10-CM | POA: Diagnosis not present

## 2018-01-07 DIAGNOSIS — N3281 Overactive bladder: Secondary | ICD-10-CM | POA: Diagnosis not present

## 2018-01-07 DIAGNOSIS — K219 Gastro-esophageal reflux disease without esophagitis: Secondary | ICD-10-CM | POA: Diagnosis not present

## 2018-01-07 DIAGNOSIS — E559 Vitamin D deficiency, unspecified: Secondary | ICD-10-CM | POA: Diagnosis not present

## 2018-01-07 DIAGNOSIS — I5040 Unspecified combined systolic (congestive) and diastolic (congestive) heart failure: Secondary | ICD-10-CM | POA: Diagnosis not present

## 2018-01-07 DIAGNOSIS — E782 Mixed hyperlipidemia: Secondary | ICD-10-CM | POA: Diagnosis not present

## 2018-01-07 DIAGNOSIS — M150 Primary generalized (osteo)arthritis: Secondary | ICD-10-CM | POA: Diagnosis not present

## 2018-01-07 DIAGNOSIS — R296 Repeated falls: Secondary | ICD-10-CM | POA: Diagnosis not present

## 2018-01-18 ENCOUNTER — Ambulatory Visit: Payer: PPO | Admitting: Sports Medicine

## 2018-01-18 ENCOUNTER — Encounter: Payer: Self-pay | Admitting: Sports Medicine

## 2018-01-18 DIAGNOSIS — S90821D Blister (nonthermal), right foot, subsequent encounter: Secondary | ICD-10-CM

## 2018-01-18 DIAGNOSIS — M79671 Pain in right foot: Secondary | ICD-10-CM

## 2018-01-18 DIAGNOSIS — Z7901 Long term (current) use of anticoagulants: Secondary | ICD-10-CM
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
## 2018-01-26 DIAGNOSIS — N390 Urinary tract infection, site not specified: Secondary | ICD-10-CM | POA: Diagnosis not present

## 2018-01-28 DIAGNOSIS — M150 Primary generalized (osteo)arthritis: Secondary | ICD-10-CM | POA: Diagnosis not present

## 2018-01-28 DIAGNOSIS — G894 Chronic pain syndrome: Secondary | ICD-10-CM | POA: Diagnosis not present

## 2018-01-28 DIAGNOSIS — G9009 Other idiopathic peripheral autonomic neuropathy: Secondary | ICD-10-CM | POA: Diagnosis not present

## 2018-02-04 DIAGNOSIS — M6281 Muscle weakness (generalized): Secondary | ICD-10-CM | POA: Diagnosis not present

## 2018-02-04 DIAGNOSIS — N3281 Overactive bladder: Secondary | ICD-10-CM | POA: Diagnosis not present

## 2018-02-04 DIAGNOSIS — E782 Mixed hyperlipidemia: Secondary | ICD-10-CM | POA: Diagnosis not present

## 2018-02-04 DIAGNOSIS — I480 Paroxysmal atrial fibrillation: Secondary | ICD-10-CM | POA: Diagnosis not present

## 2018-02-04 DIAGNOSIS — E039 Hypothyroidism, unspecified: Secondary | ICD-10-CM | POA: Diagnosis not present

## 2018-02-04 DIAGNOSIS — H04123 Dry eye syndrome of bilateral lacrimal glands: Secondary | ICD-10-CM | POA: Diagnosis not present

## 2018-02-04 DIAGNOSIS — R2689 Other abnormalities of gait and mobility: Secondary | ICD-10-CM | POA: Diagnosis not present

## 2018-02-04 DIAGNOSIS — I2510 Atherosclerotic heart disease of native coronary artery without angina pectoris: Secondary | ICD-10-CM | POA: Diagnosis not present

## 2018-02-04 DIAGNOSIS — R296 Repeated falls: Secondary | ICD-10-CM | POA: Diagnosis not present

## 2018-02-04 DIAGNOSIS — K5901 Slow transit constipation: Secondary | ICD-10-CM | POA: Diagnosis not present

## 2018-02-04 DIAGNOSIS — I5040 Unspecified combined systolic (congestive) and diastolic (congestive) heart failure: Secondary | ICD-10-CM | POA: Diagnosis not present

## 2018-02-04 DIAGNOSIS — M150 Primary generalized (osteo)arthritis: Secondary | ICD-10-CM | POA: Diagnosis not present

## 2018-02-22 DIAGNOSIS — Z79899 Other long term (current) drug therapy: Secondary | ICD-10-CM | POA: Diagnosis not present

## 2018-02-25 DIAGNOSIS — G894 Chronic pain syndrome: Secondary | ICD-10-CM | POA: Diagnosis not present

## 2018-02-25 DIAGNOSIS — G9009 Other idiopathic peripheral autonomic neuropathy: Secondary | ICD-10-CM | POA: Diagnosis not present

## 2018-02-25 DIAGNOSIS — M150 Primary generalized (osteo)arthritis: Secondary | ICD-10-CM | POA: Diagnosis not present

## 2018-03-22 ENCOUNTER — Encounter: Payer: Self-pay | Admitting: Sports Medicine

## 2018-03-22 ENCOUNTER — Ambulatory Visit: Payer: PPO | Admitting: Sports Medicine

## 2018-03-22 DIAGNOSIS — B351 Tinea unguium: Secondary | ICD-10-CM | POA: Diagnosis not present

## 2018-03-22 DIAGNOSIS — M79675 Pain in left toe(s): Secondary | ICD-10-CM

## 2018-03-22 DIAGNOSIS — L600 Ingrowing nail: Secondary | ICD-10-CM

## 2018-03-22 DIAGNOSIS — M79674 Pain in right toe(s): Secondary | ICD-10-CM

## 2018-03-22 DIAGNOSIS — L89610 Pressure ulcer of right heel, unstageable: Secondary | ICD-10-CM

## 2018-03-22 DIAGNOSIS — L97312 Non-pressure chronic ulcer of right ankle with fat layer exposed: Secondary | ICD-10-CM

## 2018-03-22 DIAGNOSIS — S90819A Abrasion, unspecified foot, initial encounter: Secondary | ICD-10-CM

## 2018-03-22 MED ORDER — NEOMYCIN-POLYMYXIN-HC 3.5-10000-1 OT SOLN: OTIC | 0 refills | Status: AC

## 2018-03-22 MED ORDER — LIDOCAINE 5 % EX OINT: 1.0000 "application " | TOPICAL_OINTMENT | CUTANEOUS | 0 refills | Status: AC | PRN
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
## 2018-03-25 DIAGNOSIS — Z0000 Encounter for general adult medical examination without abnormal findings: Secondary | ICD-10-CM | POA: Diagnosis not present

## 2018-04-08 DIAGNOSIS — G9009 Other idiopathic peripheral autonomic neuropathy: Secondary | ICD-10-CM | POA: Diagnosis not present

## 2018-04-08 DIAGNOSIS — M150 Primary generalized (osteo)arthritis: Secondary | ICD-10-CM | POA: Diagnosis not present

## 2018-04-08 DIAGNOSIS — G894 Chronic pain syndrome: Secondary | ICD-10-CM | POA: Diagnosis not present

## 2018-04-12 DIAGNOSIS — R300 Dysuria: Secondary | ICD-10-CM | POA: Diagnosis not present

## 2018-04-18 DIAGNOSIS — Z79899 Other long term (current) drug therapy: Secondary | ICD-10-CM | POA: Diagnosis not present

## 2018-04-24 ENCOUNTER — Encounter (HOSPITAL_COMMUNITY): Payer: Self-pay | Admitting: *Deleted

## 2018-04-24 ENCOUNTER — Emergency Department (HOSPITAL_COMMUNITY)
Admission: EM | Admit: 2018-04-24 | Discharge: 2018-04-24 | Disposition: A | Discharge status: Home / Self Care | Payer: PPO | Attending: Emergency Medicine | Admitting: Emergency Medicine

## 2018-04-24 ENCOUNTER — Other Ambulatory Visit: Payer: Self-pay

## 2018-04-24 DIAGNOSIS — Z79899 Other long term (current) drug therapy: Secondary | ICD-10-CM | POA: Diagnosis not present

## 2018-04-24 DIAGNOSIS — Y929 Unspecified place or not applicable: Secondary | ICD-10-CM | POA: Diagnosis not present

## 2018-04-24 DIAGNOSIS — E039 Hypothyroidism, unspecified: Secondary | ICD-10-CM | POA: Insufficient documentation

## 2018-04-24 DIAGNOSIS — I509 Heart failure, unspecified: Secondary | ICD-10-CM | POA: Insufficient documentation

## 2018-04-24 DIAGNOSIS — F0390 Unspecified dementia without behavioral disturbance: Secondary | ICD-10-CM | POA: Diagnosis not present

## 2018-04-24 DIAGNOSIS — Y999 Unspecified external cause status: Secondary | ICD-10-CM | POA: Insufficient documentation

## 2018-04-24 DIAGNOSIS — S61451A Open bite of right hand, initial encounter: Secondary | ICD-10-CM | POA: Diagnosis not present

## 2018-04-24 DIAGNOSIS — I110 Hypertensive heart disease with heart failure: Secondary | ICD-10-CM | POA: Diagnosis not present

## 2018-04-24 DIAGNOSIS — S61412A Laceration without foreign body of left hand, initial encounter: Secondary | ICD-10-CM | POA: Diagnosis not present

## 2018-04-24 DIAGNOSIS — W540XXA Bitten by dog, initial encounter: Secondary | ICD-10-CM | POA: Insufficient documentation

## 2018-04-24 DIAGNOSIS — I959 Hypotension, unspecified: Secondary | ICD-10-CM | POA: Diagnosis not present

## 2018-04-24 DIAGNOSIS — Y939 Activity, unspecified: Secondary | ICD-10-CM | POA: Diagnosis not present

## 2018-04-24 DIAGNOSIS — S61411A Laceration without foreign body of right hand, initial encounter: Secondary | ICD-10-CM | POA: Diagnosis not present

## 2018-04-24 MED ORDER — AMOXICILLIN-POT CLAVULANATE 875-125 MG PO TABS: 1.0000 | ORAL_TABLET | Freq: Two times a day (BID) | ORAL | 0 refills | Status: AC

## 2018-04-24 MED ORDER — AMOXICILLIN-POT CLAVULANATE 875-125 MG PO TABS
1.0000 | ORAL_TABLET | Freq: Once | ORAL | Status: AC
  Administered 2018-04-24: 1 via ORAL
  Filled 2018-04-24: qty 1

## 2018-04-24 NOTE — ED Notes (Signed)
Bed: OO87 Expected date:  Expected time:  Means of arrival:  Comments: 85 yo dog bite, skin tear

## 2018-04-24 NOTE — ED Notes (Signed)
Pt's family requesting [facility] to be called unless "they are really busy because I dont want to wait 6 hours again"

## 2018-04-24 NOTE — ED Notes (Signed)
Irrigated wound with 500 cc NS, Vaseline and nonstick Telfa applied then wrapped for safety of bumping area.
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
## 2018-04-24 NOTE — ED Notes (Addendum)
Pt and family verbalized discharge instructions and follow up care. Alert and assisted into car by staff.  Taken back to facility by family

## 2018-04-24 NOTE — ED Triage Notes (Signed)
EMS stated pt resides at [facility]. Small dog bite the back of her hand, daughters dog who is up to date on all shots. More of skin tear than actual bite. [id number]

## 2018-04-24 NOTE — Discharge Instructions (Signed)
Call the wound care center to schedule a follow-up visit.  Return here for fever, severe swelling to your hand, or any other problems

## 2018-05-04 ENCOUNTER — Encounter (HOSPITAL_BASED_OUTPATIENT_CLINIC_OR_DEPARTMENT_OTHER): Attending: Internal Medicine

## 2018-05-24 ENCOUNTER — Ambulatory Visit: Payer: PPO | Admitting: Sports Medicine

## 2018-05-24 ENCOUNTER — Ambulatory Visit: Payer: PPO | Admitting: Neurology

## 2018-05-24 ENCOUNTER — Encounter: Payer: Self-pay | Admitting: Sports Medicine

## 2018-05-24 ENCOUNTER — Other Ambulatory Visit: Payer: Self-pay

## 2018-05-24 VITALS — Temp 98.4°F

## 2018-05-24 DIAGNOSIS — B351 Tinea unguium: Secondary | ICD-10-CM

## 2018-05-24 DIAGNOSIS — L600 Ingrowing nail: Secondary | ICD-10-CM

## 2018-05-24 DIAGNOSIS — M79675 Pain in left toe(s): Secondary | ICD-10-CM

## 2018-05-24 DIAGNOSIS — L89610 Pressure ulcer of right heel, unstageable: Secondary | ICD-10-CM

## 2018-05-24 DIAGNOSIS — S90819A Abrasion, unspecified foot, initial encounter: Secondary | ICD-10-CM

## 2018-05-24 DIAGNOSIS — M79674 Pain in right toe(s): Secondary | ICD-10-CM
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
## 2018-07-25 DEATH — deceased

## 2018-07-26 ENCOUNTER — Ambulatory Visit: Payer: PPO | Admitting: Sports Medicine
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
## 2018-12-28 ENCOUNTER — Ambulatory Visit: Payer: PPO | Admitting: Neurology

## 2019-11-18 IMAGING — DX DG CHEST 1V
1 series · 1 of 1 positions shown · non-contrast
Comparison: 06/25/2016

CLINICAL DATA: Chest pain.

EXAM:
CHEST  1 VIEW

[chest ap]
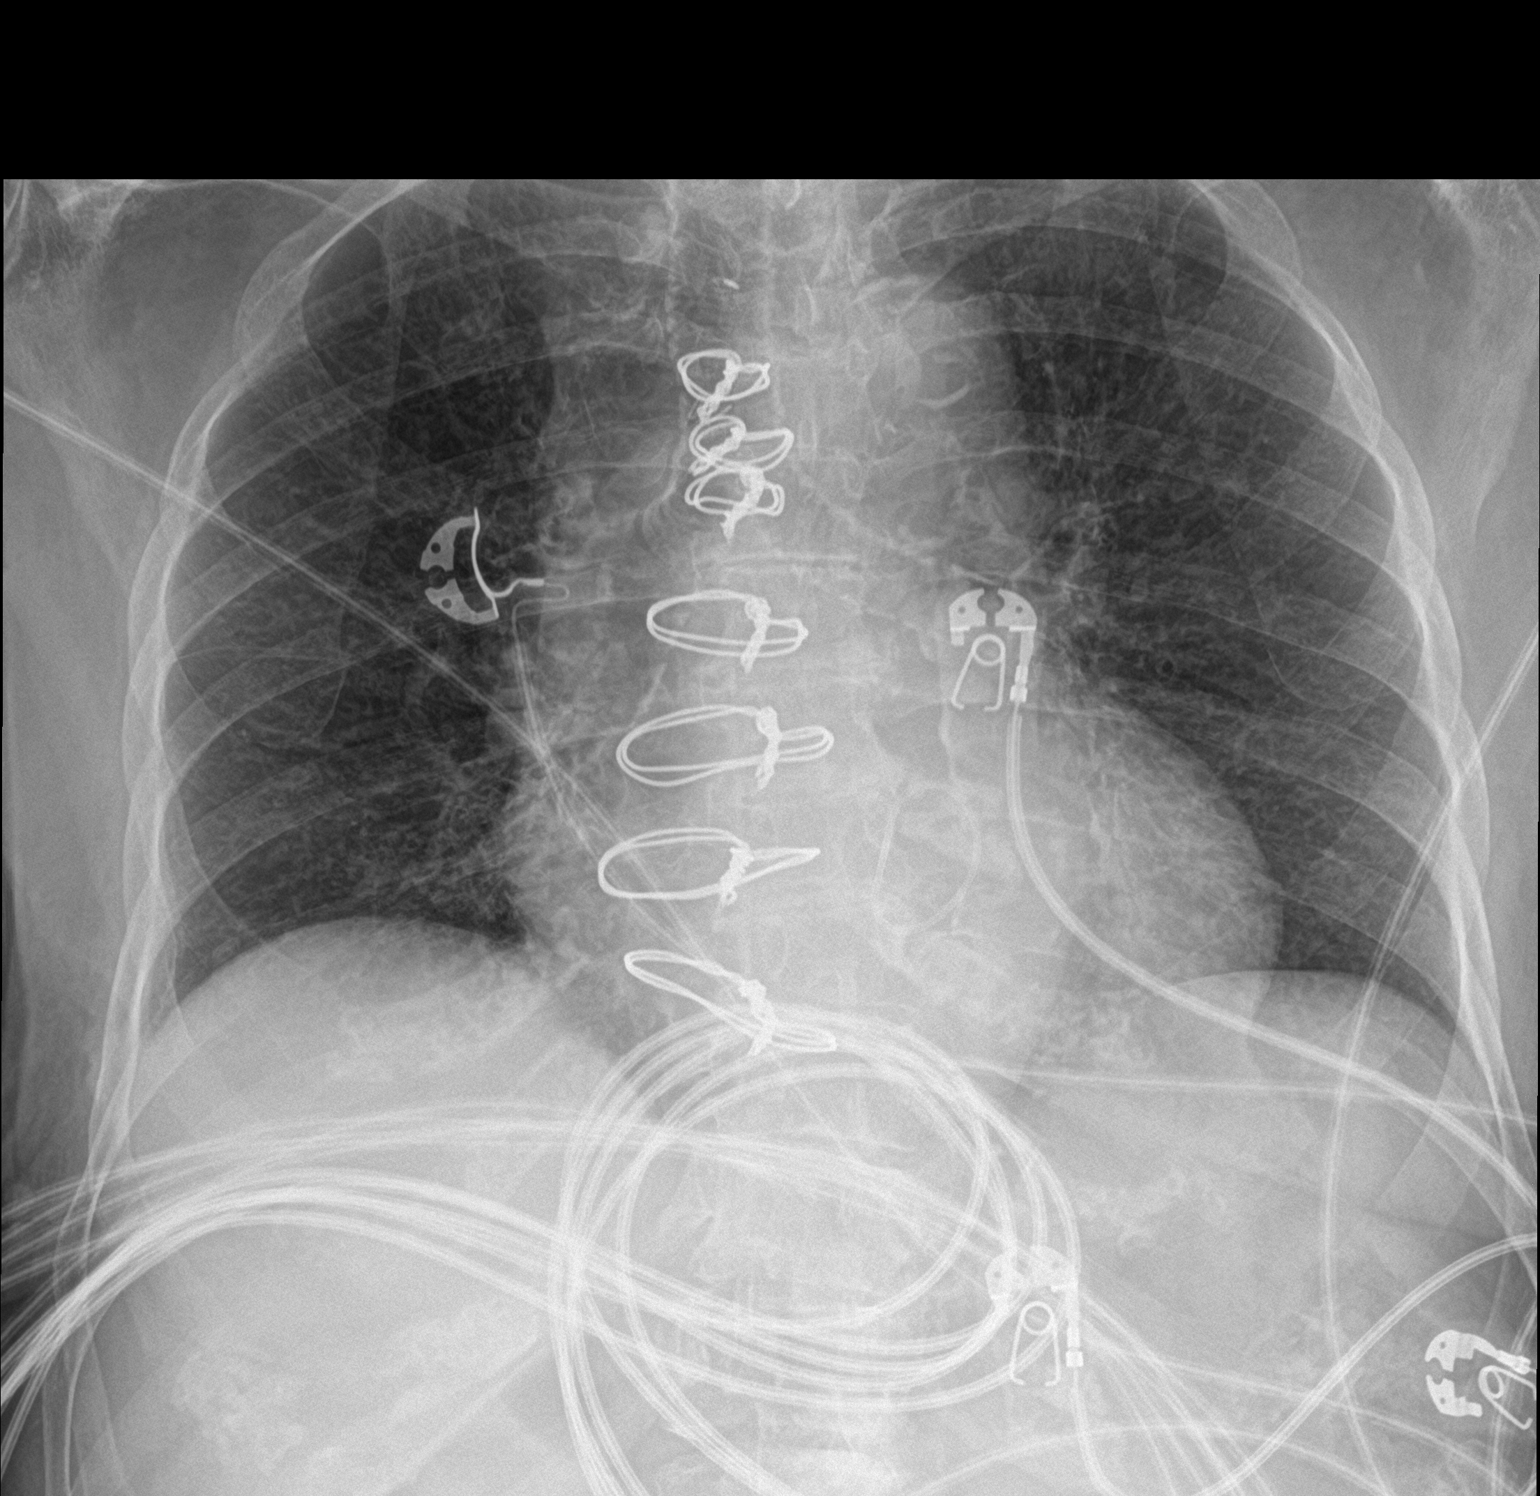

[1 of 1 positions shown; findings below may reference images not displayed]

FINDINGS: Cardiomegaly with aortic atherosclerosis. Status post mitral valve
replacement. Median sternotomy sutures are in place. Lungs are
clear. No pulmonary edema, effusion or pneumothorax. No acute
osseous abnormality is seen.
IMPRESSION: Cardiomegaly with aortic atherosclerosis. No active pulmonary
disease.
